# Patient Record
Sex: Male | Born: 1935 | Race: White | Hispanic: No | Marital: Married | Smoking: Never smoker
Health system: Southern US, Community
[De-identification: ages and names within clinical notes are randomized; demographics above are authoritative.]

## PROBLEM LIST (undated history)

## (undated) DIAGNOSIS — T07XXXA Unspecified multiple injuries, initial encounter: Secondary | ICD-10-CM

## (undated) DIAGNOSIS — E039 Hypothyroidism, unspecified: Secondary | ICD-10-CM

## (undated) DIAGNOSIS — K859 Acute pancreatitis without necrosis or infection, unspecified: Secondary | ICD-10-CM

## (undated) DIAGNOSIS — A219 Tularemia, unspecified: Secondary | ICD-10-CM

## (undated) DIAGNOSIS — Z8619 Personal history of other infectious and parasitic diseases: Secondary | ICD-10-CM

## (undated) DIAGNOSIS — I10 Essential (primary) hypertension: Secondary | ICD-10-CM

## (undated) DIAGNOSIS — J189 Pneumonia, unspecified organism: Secondary | ICD-10-CM

## (undated) DIAGNOSIS — K5730 Diverticulosis of large intestine without perforation or abscess without bleeding: Secondary | ICD-10-CM

## (undated) DIAGNOSIS — F0390 Unspecified dementia without behavioral disturbance: Secondary | ICD-10-CM

## (undated) DIAGNOSIS — N400 Enlarged prostate without lower urinary tract symptoms: Secondary | ICD-10-CM

## (undated) DIAGNOSIS — N2889 Other specified disorders of kidney and ureter: Secondary | ICD-10-CM

## (undated) DIAGNOSIS — I639 Cerebral infarction, unspecified: Secondary | ICD-10-CM

## (undated) HISTORY — DX: Personal history of other infectious and parasitic diseases: Z86.19

## (undated) HISTORY — DX: Other specified disorders of kidney and ureter: N28.89

## (undated) HISTORY — DX: Benign prostatic hyperplasia without lower urinary tract symptoms: N40.0

## (undated) HISTORY — DX: Acute pancreatitis without necrosis or infection, unspecified: K85.90

## (undated) HISTORY — DX: Diverticulosis of large intestine without perforation or abscess without bleeding: K57.30

## (undated) HISTORY — DX: Essential (primary) hypertension: I10

## (undated) HISTORY — DX: Hypothyroidism, unspecified: E03.9

## (undated) HISTORY — DX: Tularemia, unspecified: A21.9

## (undated) HISTORY — PX: EYE MUSCLE SURGERY: SHX370

## (undated) HISTORY — DX: Cerebral infarction, unspecified: I63.9

## (undated) HISTORY — DX: Pneumonia, unspecified organism: J18.9

---
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
## 1957-04-04 HISTORY — PX: APPENDECTOMY: SHX54

## 1964-04-04 HISTORY — PX: THYROIDECTOMY, PARTIAL: SHX18

## 1994-01-02 ENCOUNTER — Encounter: Payer: Self-pay | Admitting: Family Medicine

## 1994-01-02 LAB — CONVERTED CEMR LAB: PSA: 0.6 ng/mL

## 1995-06-03 HISTORY — PX: KNEE ARTHROSCOPY: SUR90

## 1997-02-02 ENCOUNTER — Encounter: Payer: Self-pay | Admitting: Family Medicine

## 1997-02-02 LAB — CONVERTED CEMR LAB: PSA: 0.6 ng/mL

## 1998-06-17 ENCOUNTER — Ambulatory Visit (HOSPITAL_COMMUNITY): Admission: RE | Admit: 1998-06-17 | Discharge: 1998-06-17 | Payer: Self-pay | Admitting: Gastroenterology

## 1998-10-16 ENCOUNTER — Ambulatory Visit (HOSPITAL_COMMUNITY): Admission: RE | Admit: 1998-10-16 | Discharge: 1998-10-16 | Payer: Self-pay | Admitting: Gastroenterology

## 1999-02-03 ENCOUNTER — Encounter: Payer: Self-pay | Admitting: Family Medicine

## 2000-02-03 ENCOUNTER — Encounter: Payer: Self-pay | Admitting: Family Medicine

## 2000-03-03 ENCOUNTER — Encounter: Admission: RE | Admit: 2000-03-03 | Discharge: 2000-03-03 | Payer: Self-pay | Admitting: Family Medicine

## 2000-03-03 ENCOUNTER — Encounter: Payer: Self-pay | Admitting: Family Medicine

## 2000-12-06 ENCOUNTER — Encounter (INDEPENDENT_AMBULATORY_CARE_PROVIDER_SITE_OTHER): Payer: Self-pay | Admitting: Specialist

## 2000-12-06 ENCOUNTER — Ambulatory Visit (HOSPITAL_COMMUNITY): Admission: RE | Admit: 2000-12-06 | Discharge: 2000-12-06 | Payer: Self-pay | Admitting: Gastroenterology

## 2001-04-24 ENCOUNTER — Encounter: Payer: Self-pay | Admitting: Orthopedic Surgery

## 2001-04-30 ENCOUNTER — Inpatient Hospital Stay (HOSPITAL_COMMUNITY): Admission: RE | Admit: 2001-04-30 | Discharge: 2001-05-04 | Payer: Self-pay | Admitting: Orthopedic Surgery

## 2001-04-30 HISTORY — PX: JOINT REPLACEMENT: SHX530

## 2001-05-04 ENCOUNTER — Inpatient Hospital Stay (HOSPITAL_COMMUNITY)
Admission: RE | Admit: 2001-05-04 | Discharge: 2001-05-06 | Payer: Self-pay | Admitting: Physical Medicine & Rehabilitation

## 2001-06-12 ENCOUNTER — Encounter: Admission: RE | Admit: 2001-06-12 | Discharge: 2001-07-23 | Payer: Self-pay | Admitting: Orthopedic Surgery

## 2002-02-02 ENCOUNTER — Encounter: Payer: Self-pay | Admitting: Family Medicine

## 2003-01-31 ENCOUNTER — Emergency Department (HOSPITAL_COMMUNITY): Admission: EM | Admit: 2003-01-31 | Discharge: 2003-01-31 | Payer: Self-pay | Admitting: Emergency Medicine

## 2003-04-05 ENCOUNTER — Encounter: Payer: Self-pay | Admitting: Family Medicine

## 2003-04-05 LAB — CONVERTED CEMR LAB: PSA: 0.3 ng/mL

## 2003-11-12 ENCOUNTER — Encounter (INDEPENDENT_AMBULATORY_CARE_PROVIDER_SITE_OTHER): Payer: Self-pay | Admitting: *Deleted

## 2003-11-12 ENCOUNTER — Ambulatory Visit (HOSPITAL_COMMUNITY): Admission: RE | Admit: 2003-11-12 | Discharge: 2003-11-12 | Payer: Self-pay | Admitting: Gastroenterology

## 2004-04-04 ENCOUNTER — Encounter: Payer: Self-pay | Admitting: Family Medicine

## 2004-04-07 ENCOUNTER — Ambulatory Visit: Payer: Self-pay | Admitting: Family Medicine

## 2004-04-09 ENCOUNTER — Ambulatory Visit: Payer: Self-pay | Admitting: Family Medicine

## 2004-10-08 ENCOUNTER — Ambulatory Visit: Payer: Self-pay | Admitting: Family Medicine

## 2004-10-11 ENCOUNTER — Ambulatory Visit: Payer: Self-pay | Admitting: Family Medicine

## 2004-11-01 ENCOUNTER — Ambulatory Visit: Payer: Self-pay | Admitting: Family Medicine

## 2005-02-17 ENCOUNTER — Ambulatory Visit: Payer: Self-pay | Admitting: Family Medicine

## 2005-04-04 ENCOUNTER — Encounter: Payer: Self-pay | Admitting: Family Medicine

## 2005-04-07 ENCOUNTER — Ambulatory Visit: Payer: Self-pay | Admitting: Family Medicine

## 2005-04-13 ENCOUNTER — Ambulatory Visit: Payer: Self-pay | Admitting: Family Medicine

## 2005-04-28 ENCOUNTER — Ambulatory Visit: Payer: Self-pay | Admitting: Family Medicine

## 2005-10-12 ENCOUNTER — Ambulatory Visit: Payer: Self-pay | Admitting: Family Medicine

## 2006-02-15 ENCOUNTER — Ambulatory Visit: Payer: Self-pay | Admitting: Family Medicine

## 2006-04-04 ENCOUNTER — Encounter: Payer: Self-pay | Admitting: Family Medicine

## 2006-04-17 ENCOUNTER — Ambulatory Visit: Payer: Self-pay | Admitting: Family Medicine

## 2006-04-17 LAB — CONVERTED CEMR LAB
AST: 18 units/L (ref 0–37)
Albumin: 3.9 g/dL (ref 3.5–5.2)
CO2: 29 meq/L (ref 19–32)
Chloride: 107 meq/L (ref 96–112)
Chol/HDL Ratio, serum: 2.7
Creatinine, Ser: 1.3 mg/dL (ref 0.4–1.5)
Glucose, Bld: 95 mg/dL (ref 70–99)
Potassium: 4.1 meq/L (ref 3.5–5.1)
Sodium: 141 meq/L (ref 135–145)
Total Bilirubin: 1 mg/dL (ref 0.3–1.2)
Triglyceride fasting, serum: 85 mg/dL (ref 0–149)
VLDL: 17 mg/dL (ref 0–40)

## 2006-04-19 ENCOUNTER — Ambulatory Visit: Payer: Self-pay | Admitting: Family Medicine

## 2006-05-09 ENCOUNTER — Ambulatory Visit: Payer: Self-pay | Admitting: Family Medicine

## 2006-09-28 ENCOUNTER — Encounter: Payer: Self-pay | Admitting: Family Medicine

## 2006-09-28 DIAGNOSIS — R519 Headache, unspecified: Secondary | ICD-10-CM | POA: Insufficient documentation

## 2006-09-28 DIAGNOSIS — Z9189 Other specified personal risk factors, not elsewhere classified: Secondary | ICD-10-CM | POA: Insufficient documentation

## 2006-09-28 DIAGNOSIS — N508 Other specified disorders of male genital organs: Secondary | ICD-10-CM | POA: Insufficient documentation

## 2006-09-28 DIAGNOSIS — R51 Headache: Secondary | ICD-10-CM

## 2006-09-28 DIAGNOSIS — K5730 Diverticulosis of large intestine without perforation or abscess without bleeding: Secondary | ICD-10-CM | POA: Insufficient documentation

## 2006-09-28 DIAGNOSIS — K2270 Barrett's esophagus without dysplasia: Secondary | ICD-10-CM

## 2006-10-02 DIAGNOSIS — N400 Enlarged prostate without lower urinary tract symptoms: Secondary | ICD-10-CM

## 2006-10-05 ENCOUNTER — Ambulatory Visit: Payer: Self-pay | Admitting: Family Medicine

## 2006-10-26 ENCOUNTER — Encounter: Payer: Self-pay | Admitting: Family Medicine

## 2007-02-16 ENCOUNTER — Ambulatory Visit: Payer: Self-pay | Admitting: Family Medicine

## 2007-04-09 ENCOUNTER — Ambulatory Visit: Payer: Self-pay | Admitting: Family Medicine

## 2007-04-09 DIAGNOSIS — E7800 Pure hypercholesterolemia, unspecified: Secondary | ICD-10-CM | POA: Insufficient documentation

## 2007-04-09 LAB — CONVERTED CEMR LAB
ALT: 13 units/L (ref 0–53)
Albumin: 3.8 g/dL (ref 3.5–5.2)
Alkaline Phosphatase: 61 units/L (ref 39–117)
BUN: 22 mg/dL (ref 6–23)
Bilirubin, Direct: 0.1 mg/dL (ref 0.0–0.3)
CO2: 28 meq/L (ref 19–32)
Chloride: 107 meq/L (ref 96–112)
Creatinine,U: 89 mg/dL
GFR calc non Af Amer: 70 mL/min
LDL Cholesterol: 90 mg/dL (ref 0–99)
Microalb Creat Ratio: 4.5 mg/g (ref 0.0–30.0)
Microalb, Ur: 0.4 mg/dL (ref 0.0–1.9)
Potassium: 4.2 meq/L (ref 3.5–5.1)
Sodium: 141 meq/L (ref 135–145)
TSH: 2.88 microintl units/mL (ref 0.35–5.50)
Total CHOL/HDL Ratio: 3
Total Protein: 6.5 g/dL (ref 6.0–8.3)
VLDL: 10 mg/dL (ref 0–40)

## 2007-04-16 ENCOUNTER — Ambulatory Visit: Payer: Self-pay | Admitting: Family Medicine

## 2007-08-22 ENCOUNTER — Ambulatory Visit: Payer: Self-pay | Admitting: Family Medicine

## 2007-09-28 ENCOUNTER — Encounter: Payer: Self-pay | Admitting: Family Medicine

## 2008-01-08 ENCOUNTER — Ambulatory Visit: Payer: Self-pay | Admitting: Family Medicine

## 2008-05-14 ENCOUNTER — Ambulatory Visit: Payer: Self-pay | Admitting: Family Medicine

## 2008-05-21 ENCOUNTER — Ambulatory Visit: Payer: Self-pay | Admitting: Family Medicine

## 2008-08-25 ENCOUNTER — Ambulatory Visit: Payer: Self-pay | Admitting: Family Medicine

## 2008-08-25 LAB — CONVERTED CEMR LAB
ALT: 12 units/L (ref 0–53)
Albumin: 3.7 g/dL (ref 3.5–5.2)
Alkaline Phosphatase: 64 units/L (ref 39–117)
Basophils Absolute: 0 10*3/uL (ref 0.0–0.1)
Bilirubin, Direct: 0.2 mg/dL (ref 0.0–0.3)
CO2: 28 meq/L (ref 19–32)
Creatinine,U: 64.8 mg/dL
GFR calc non Af Amer: 77.27 mL/min (ref >60)
HDL: 40.6 mg/dL (ref >39.00)
LDL Cholesterol: 81 mg/dL (ref 0–99)
Lymphs Abs: 1.4 10*3/uL (ref 0.7–4.0)
MCV: 87.1 fL (ref 78.0–100.0)
Microalb Creat Ratio: 3.1 mg/g (ref 0.0–30.0)
Microalb, Ur: 0.2 mg/dL (ref 0.0–1.9)
Monocytes Absolute: 0.6 10*3/uL (ref 0.1–1.0)
Neutrophils Relative %: 64.1 % (ref 43.0–77.0)
RBC: 4.99 M/uL (ref 4.22–5.81)
TSH: 1.07 microintl units/mL (ref 0.35–5.50)
Total CHOL/HDL Ratio: 3
VLDL: 15.6 mg/dL (ref 0.0–40.0)
WBC: 5.9 10*3/uL (ref 4.5–10.5)

## 2008-08-27 ENCOUNTER — Ambulatory Visit: Payer: Self-pay | Admitting: Family Medicine

## 2009-01-14 ENCOUNTER — Telehealth: Payer: Self-pay | Admitting: Family Medicine

## 2009-01-19 ENCOUNTER — Ambulatory Visit: Payer: Self-pay | Admitting: Family Medicine

## 2009-02-18 ENCOUNTER — Ambulatory Visit: Payer: Self-pay | Admitting: Family Medicine

## 2009-02-18 LAB — CONVERTED CEMR LAB
Glucose, Urine, Semiquant: NEGATIVE
Ketones, urine, test strip: NEGATIVE
Nitrite: NEGATIVE
Protein, U semiquant: >=300

## 2009-02-19 ENCOUNTER — Encounter: Payer: Self-pay | Admitting: Family Medicine

## 2009-03-05 ENCOUNTER — Ambulatory Visit: Payer: Self-pay | Admitting: Family Medicine

## 2009-03-05 DIAGNOSIS — G250 Essential tremor: Secondary | ICD-10-CM

## 2009-03-05 DIAGNOSIS — G252 Other specified forms of tremor: Secondary | ICD-10-CM

## 2009-03-05 LAB — CONVERTED CEMR LAB
Blood in Urine, dipstick: NEGATIVE
Glucose, Urine, Semiquant: NEGATIVE
Ketones, urine, test strip: NEGATIVE
Protein, U semiquant: NEGATIVE
Specific Gravity, Urine: 1.025
Urobilinogen, UA: 0.2
pH: 5

## 2009-04-04 DIAGNOSIS — N2889 Other specified disorders of kidney and ureter: Secondary | ICD-10-CM

## 2009-04-04 DIAGNOSIS — K859 Acute pancreatitis without necrosis or infection, unspecified: Secondary | ICD-10-CM

## 2009-04-04 HISTORY — DX: Other specified disorders of kidney and ureter: N28.89

## 2009-04-04 HISTORY — DX: Acute pancreatitis without necrosis or infection, unspecified: K85.90

## 2009-04-29 ENCOUNTER — Ambulatory Visit: Payer: Self-pay | Admitting: Family Medicine

## 2009-05-07 ENCOUNTER — Encounter: Payer: Self-pay | Admitting: Family Medicine

## 2009-06-11 ENCOUNTER — Encounter: Payer: Self-pay | Admitting: Family Medicine

## 2009-08-26 ENCOUNTER — Ambulatory Visit: Payer: Self-pay | Admitting: Family Medicine

## 2009-08-26 LAB — CONVERTED CEMR LAB
Albumin: 3.9 g/dL (ref 3.5–5.2)
BUN: 24 mg/dL — ABNORMAL HIGH (ref 6–23)
Basophils Absolute: 0.1 10*3/uL (ref 0.0–0.1)
Bilirubin, Direct: 0.2 mg/dL (ref 0.0–0.3)
CO2: 28 meq/L (ref 19–32)
Cholesterol: 139 mg/dL (ref 0–200)
Creatinine, Ser: 1.2 mg/dL (ref 0.4–1.5)
Free T4: 1.1 ng/dL (ref 0.6–1.6)
HCT: 42.9 % (ref 39.0–52.0)
HDL: 40.2 mg/dL (ref >39.00)
Hemoglobin: 14.6 g/dL (ref 13.0–17.0)
Lymphs Abs: 1.6 10*3/uL (ref 0.7–4.0)
MCV: 87.6 fL (ref 78.0–100.0)
Monocytes Relative: 9.2 % (ref 3.0–12.0)
Neutrophils Relative %: 62.4 % (ref 43.0–77.0)
Platelets: 202 10*3/uL (ref 150.0–400.0)
RDW: 13.4 % (ref 11.5–14.6)
Sodium: 142 meq/L (ref 135–145)
TSH: 0.88 microintl units/mL (ref 0.35–5.50)
Total CHOL/HDL Ratio: 3
Triglycerides: 87 mg/dL (ref 0.0–149.0)

## 2009-09-03 ENCOUNTER — Ambulatory Visit: Payer: Self-pay | Admitting: Family Medicine

## 2009-09-03 DIAGNOSIS — R5383 Other fatigue: Secondary | ICD-10-CM

## 2009-09-03 DIAGNOSIS — R5381 Other malaise: Secondary | ICD-10-CM | POA: Insufficient documentation

## 2009-09-06 LAB — CONVERTED CEMR LAB: Vit D, 25-Hydroxy: 40 ng/mL (ref 30–89)

## 2009-10-02 HISTORY — PX: CHOLECYSTECTOMY: SHX55

## 2009-10-14 ENCOUNTER — Inpatient Hospital Stay (HOSPITAL_COMMUNITY)
Admission: EM | Admit: 2009-10-14 | Discharge: 2009-10-18 | Payer: Self-pay | Source: Home / Self Care | Admitting: Emergency Medicine

## 2009-10-17 ENCOUNTER — Encounter (INDEPENDENT_AMBULATORY_CARE_PROVIDER_SITE_OTHER): Payer: Self-pay | Admitting: Internal Medicine

## 2009-10-23 ENCOUNTER — Encounter: Payer: Self-pay | Admitting: Family Medicine

## 2009-11-03 ENCOUNTER — Ambulatory Visit: Payer: Self-pay | Admitting: Family Medicine

## 2009-11-03 DIAGNOSIS — K869 Disease of pancreas, unspecified: Secondary | ICD-10-CM | POA: Insufficient documentation

## 2009-11-10 ENCOUNTER — Encounter (INDEPENDENT_AMBULATORY_CARE_PROVIDER_SITE_OTHER): Payer: Self-pay | Admitting: *Deleted

## 2009-12-02 ENCOUNTER — Telehealth: Payer: Self-pay | Admitting: Family Medicine

## 2010-01-12 ENCOUNTER — Ambulatory Visit (HOSPITAL_COMMUNITY)
Admission: RE | Admit: 2010-01-12 | Discharge: 2010-01-12 | Payer: Self-pay | Source: Home / Self Care | Admitting: Gastroenterology

## 2010-02-03 ENCOUNTER — Ambulatory Visit: Payer: Self-pay | Admitting: Family Medicine

## 2010-03-16 ENCOUNTER — Encounter: Payer: Self-pay | Admitting: Family Medicine

## 2010-03-31 ENCOUNTER — Ambulatory Visit
Admission: RE | Admit: 2010-03-31 | Discharge: 2010-03-31 | Payer: Self-pay | Source: Home / Self Care | Attending: Family Medicine | Admitting: Family Medicine

## 2010-03-31 DIAGNOSIS — F3289 Other specified depressive episodes: Secondary | ICD-10-CM | POA: Insufficient documentation

## 2010-03-31 DIAGNOSIS — N390 Urinary tract infection, site not specified: Secondary | ICD-10-CM | POA: Insufficient documentation

## 2010-03-31 DIAGNOSIS — R32 Unspecified urinary incontinence: Secondary | ICD-10-CM | POA: Insufficient documentation

## 2010-03-31 DIAGNOSIS — F329 Major depressive disorder, single episode, unspecified: Secondary | ICD-10-CM | POA: Insufficient documentation

## 2010-03-31 LAB — CONVERTED CEMR LAB
Bilirubin Urine: NEGATIVE
Ketones, urine, test strip: NEGATIVE
Specific Gravity, Urine: <1.005
pH: 6.5

## 2010-04-01 ENCOUNTER — Encounter: Payer: Self-pay | Admitting: Family Medicine

## 2010-05-06 NOTE — Miscellaneous (Signed)
  Clinical Lists Changes  Observations: Added new observation of PAST MED HX: Diverticulosis, colon Hypertension Hypothyroidism Benign prostatic hypertrophy Gallstone pancreatitis and Ecoli bacteremia 2011 R renal mass 2011; Eval by [name]- echogenic mass on u/s.  Plan repeat CT  ~12/11 per uro.  (10/23/2009 10:23)      Past History:  Past Medical History: Diverticulosis, colon Hypertension Hypothyroidism Benign prostatic hypertrophy Gallstone pancreatitis and Ecoli bacteremia 2011 R renal mass 2011; Eval by [name]- echogenic mass on u/s.  Plan repeat CT  ~12/11 per uro.

## 2010-05-06 NOTE — Assessment & Plan Note (Signed)
Summary: TRANSFER FROM DR SCHALLER/F/U [place]   Vital Signs:  Patient profile:   77 year old male Height:      68.5 inches Weight:      167 pounds BMI:     25.11 Temp:     97.6 degrees F oral Pulse rate:   92 / minute Pulse rhythm:   regular BP sitting:   146 / 80  (left arm) Cuff size:   regular  Vitals Entered By: Delilah Shan CMA Duncan Dull) (November 03, 2009 10:55 AM) CC: Transfer from [facility]  -  Follow up Hospital [facility])   History of Present Illness: Hospitalized with E coli bacteremia and gallstone pancreatitis s/p lap chole.  Now off antibiotics and w/o fever, abdominal pain.  Eating well.  Getting back to baseline relatively quickly, but not quickly enough to suit the patient.  Has been walking 100 yards each way to get to the shop at home to work.  No GI symptoms.  Port sites healing per patient.    H/o small renal mass- see below- has follow up with uro for later this year.   Hospital recs reviewed.   Allergies: 1)  * Sulfa (Sulfonamides) Group  Past History:  Past Medical History: Last updated: 10/23/2009 Diverticulosis, colon Hypertension Hypothyroidism Benign prostatic hypertrophy Gallstone pancreatitis and Ecoli bacteremia 2011 R renal mass 2011; Eval by [name]- echogenic mass on u/s.  Plan repeat CT  ~12/11 per uro.  Past Surgical History: Last updated: 10/19/2009 1/95          Colonoscopy - adenomatous polyps 1940         Rabbit Fever 1959         Appendectomy 1966         Partial Thyroidectomy 1986         Severe Headache W/U  neg (Dr Deno Lunger) 10/95        CXR, increased left costophrenic angle markings 10/94        EKG, bradycardia 12/94        Colonoscopy  Polypectomy (Dr Matthias Hughs) 3/97          L Knee Arthroyomy/Arthroscopy  (Dr Kennith Center) 3/00          Colonoscopy (Buccini)  mild divertics 3/00          EGD SO/eros. esoph. ? Barrett's with increased H.H. 7/00          Resolved esoph. Barrett's with H.H. Schatzki Ring 03/03/00   Scrotal U/S   epid. cyst 12/06/00       EGD Barrett's H.H. 04/30/01     Bilat. tot. knees 11/12/03     EGD with biopsy  - Barrett's mucosa, H.H., esoph. ring  F/U 2 years. 11/12/03     Colonoscopy wnl except divertics  F/U 5 years 11/86        EEG/nml.  07/06/06       EGD/GERD, Barrett's H.H., gastritis  F/U 7/08 10/26/06     EGD Barrett's, gastritis, HH (Dr Buccini) 06/11/09     EGD Barrett"s, Stricture, HH (Dr Buccini) 06/11/09     Colonoscopy Polyp B9 Tubular Adenoma Divertics  (Dr Matthias Hughs) 10/2009      Cholecystectomy   Review of Systems       See HPI.  Otherwise negative.    Physical Exam  General:  GEN: nad, alert and oriented HEENT: mucous membranes moist NECK: supple w/o LA CV: rrr.  no murmur PULM: ctab, no inc wob ABD: soft, +bs, not tender to  palpation, port sites healing well EXT: no edema SKIN: no acute rash    Impression & Recommendations:  Problem # 1:  GALLSTONE PANCREATITIS (ICD-577.9) Stable and recovering well.  Given severity of problem and his age, I think he is actually ahead of schedule.  No change in meds.  Will plan for CPE in  ~08/2010 and patient to follow up with uro this fall.  He agrees.   Complete Medication List: 1)  Aspirin Ec 81 Mg Tbec (Aspirin) .... Take 1 tablet by mouth once a day 2)  Levothyroxine Sodium 100 Mcg Tabs (Levothyroxine sodium) .... Take 1 tablet by mouth once a day 3)  One-daily Multivitamins Tabs (Multiple vitamin) .... Once daily 4)  Gnp Saw Palmetto 160 Mg Caps (Saw palmetto (serenoa repens)) .... Once daily 5)  Pantoprazole Sodium 40 Mg Tbec (Pantoprazole sodium) .Marland Kitchen.. 1 daily by mouth 6)  Terazosin Hcl 5 Mg Caps (Terazosin hcl) .Marland Kitchen.. 1 daily by mouth  Patient Instructions: 1)  Gradually increase your exercise and let me know if you have any other problems.  Keep the appointment with [name] 2)  Please schedule a follow-up appointment as needed .   Current Allergies (reviewed today): * SULFA (SULFONAMIDES) GROUP

## 2010-05-06 NOTE — Assessment & Plan Note (Signed)
Summary: uti/alc   Vital Signs:  Patient profile:   77 year old male Height:      68.5 inches Weight:      164.0 pounds BMI:     24.66 Temp:     97.5 degrees F oral Pulse rate:   86 / minute Pulse rhythm:   regular BP sitting:   140 / 80  (left arm) Cuff size:   regular  Vitals Entered By: Benny Lennert CMA Duncan Dull) (March 31, 2010 10:52 AM)  History of Present Illness: Chief complaint ? uti patient has urgency and incontience since over the weekend. Brill also want to speak to patient about anxiety or depression patient was crying when talking about his urinary problems  77 year old male:  pleasant gentleman who started to have some dysuria and urgency over the weekend., this was during Christmas time. His wife and he both noticed some foul discharge with his urination from his penis. He is also has some incontinence, which is quite upsetting to him. Prior to this, he has not had any incontinence except in around the time of his bladder and gallbladder surgery as well as Escherichia coli sepsis. This was in August of 2011.  Urinating on himself Had this similar problem aftr the gallbladder surgery.  Has been hurting since he starting urinating. A lot of burning sensation.    06/03/2010 - [name], followup  Allergies: 1)  * Sulfa (Sulfonamides) Group  Past History:  Past medical, surgical, family and social histories (including risk factors) reviewed, and no changes noted (except as noted below).  Past Medical History: Reviewed history from 10/23/2009 and no changes required. Diverticulosis, colon Hypertension Hypothyroidism Benign prostatic hypertrophy Gallstone pancreatitis and Ecoli bacteremia 2011 R renal mass 2011; Eval by [name]- echogenic mass on u/s.  Plan repeat CT  ~12/11 per uro.  Past Surgical History: Reviewed history from 10/19/2009 and no changes required. 1/95          Colonoscopy - adenomatous polyps 1940         Rabbit Fever 1959          Appendectomy 1966         Partial Thyroidectomy 1986         Severe Headache W/U  neg (Dr Deno Lunger) 10/95        CXR, increased left costophrenic angle markings 10/94        EKG, bradycardia 12/94        Colonoscopy  Polypectomy (Dr Matthias Hughs) 3/97          L Knee Arthroyomy/Arthroscopy  (Dr Kennith Center) 3/00          Colonoscopy (Buccini)  mild divertics 3/00          EGD SO/eros. esoph. ? Barrett's with increased H.H. 7/00          Resolved esoph. Barrett's with H.H. Schatzki Ring 03/03/00   Scrotal U/S  epid. cyst 12/06/00       EGD Barrett's H.H. 04/30/01     Bilat. tot. knees 11/12/03     EGD with biopsy  - Barrett's mucosa, H.H., esoph. ring  F/U 2 years. 11/12/03     Colonoscopy wnl except divertics  F/U 5 years 11/86        EEG/nml.  07/06/06       EGD/GERD, Barrett's H.H., gastritis  F/U 7/08 10/26/06     EGD Barrett's, gastritis, HH (Dr Matthias Hughs) 06/11/09     EGD Barrett"s, Stricture, HH (Dr Matthias Hughs) 06/11/09  Colonoscopy Polyp B9 Tubular Adenoma Divertics  (Dr Matthias Hughs) 10/2009      Cholecystectomy   Family History: Reviewed history from 09/03/2009 and no changes required. Father: Dec. 66 ETOH Mother: Dec. 57 HTN, stroke Brothers A 11 Brain probs...has drain  leg problems, prostate CA, PVD - stent Brother A 70  Heart Probs Brother A 4 Brother A 60 Sister A 64 foot surgery Back probs CV:  CAD brother HBP:  Mother Prostate CA:  (+) brother ETOH: (+) father, ETOH Stroke:  (+) mother  Social History: Reviewed history from 09/28/2006 and no changes required. 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 GI:  Complains of nausea; denies vomiting. GU:  See HPI; Complains of dysuria, hematuria, incontinence, and urinary frequency.  Physical  Exam  General:  Well-developed,well-nourished,in no acute distress; alert,appropriate and cooperative throughout examination Head:  Normocephalic and atraumatic without obvious abnormalities. No apparent alopecia or balding. Ears:  no external deformities.   Nose:  no external deformity.   Neck:  No deformities, masses, or tenderness noted. Chest Wall:  No deformities, masses, tenderness or gynecomastia noted. Lungs:  Normal respiratory effort, chest expands symmetrically. Lungs are clear to auscultation, no crackles or wheezes. Heart:  Normal rate and regular rhythm. S1 and S2 normal without gallop, murmur, click, rub or other extra sounds. Abdomen:  Bowel sounds positive,abdomen soft and non-tender without masses, organomegaly or hernias noted. well healed surgical scars. Extremities:  No clubbing, cyanosis, edema, or deformity noted with normal full range of motion of all joints.   Cervical Nodes:  No lymphadenopathy noted Psych:  Cognition and judgment appear intact. Alert and cooperative with normal attention span and concentration. No apparent delusions, illusions, hallucinations   Impression & Recommendations:  Problem # 1:  UTI (ICD-599.0) Assessment New patient quite upset. Clinically and UA is consistent with urinary tract infection. Will treat as such.  If he is not improving for 5 days, think follow up with urology given his gross incontinence is appropriate and he has an established urologist.  His updated medication list for this problem includes:    Ciprofloxacin Hcl 250 Mg Tabs (Ciprofloxacin hcl) .Marland Kitchen... 1 by mouth two times a day  Orders: T-Culture, Urine ([id number])  Problem # 2:  URINARY INCONTINENCE, MALE (ICD-788.30) Assessment: New  Problem # 3:  DEPRESSION (ICD-311) situational - discussed with he and his wife at length. prior to him feeling bad, felt fairly well.  Complete Medication List: 1)  Aspirin Ec 81 Mg Tbec (Aspirin) .... Take 1 tablet by mouth  once a day 2)  Levothyroxine Sodium 100 Mcg Tabs (Levothyroxine sodium) .... Take 1 tablet by mouth once a day 3)  One-daily Multivitamins Tabs (Multiple vitamin) .... Once daily 4)  Gnp Saw Palmetto 160 Mg Caps (Saw palmetto (serenoa repens)) .... Once daily 5)  Pantoprazole Sodium 40 Mg Tbec (Pantoprazole sodium) .Marland Kitchen.. 1 daily by mouth 6)  Terazosin Hcl 5 Mg Caps (Terazosin hcl) .Marland Kitchen.. 1 daily by mouth 7)  Ciprofloxacin Hcl 250 Mg Tabs (Ciprofloxacin hcl) .Marland Kitchen.. 1 by mouth two times a day  Other Orders: UA Dipstick w/o Micro (manual) ([id number])  Patient Instructions: 1)  IF YOU ARE WORSENING CALL 2)  WE WILL CULTURE YOUR URINE, IF THAT IS NOT INFECTED OR YOU ARE NOT GETTING BETTER BY MONDAY,  THEN I WANT YOU TO SEE DR. TANNENBAUM OR ONE OF HIS PARTNERS. Prescriptions: CIPROFLOXACIN HCL 250 MG TABS (CIPROFLOXACIN HCL) 1 by mouth two times a day  #14 x 0   Entered and Authorized by:   Hannah Beat MD   Signed by:   Hannah Beat MD on 03/31/2010   Method used:   Electronically to        [facility]* (retail)       [address]       [place], [place]  [address]       Ph: [phone]       Fax: [phone]   RxID:   [id number]    Orders Added: 1)  T-Culture, Urine [[id number]] 2)  Est. Patient Level IV [27253] 3)  UA Dipstick w/o Micro (manual) [81002]    Current Allergies (reviewed today): * SULFA (SULFONAMIDES) GROUP  Laboratory Results   Urine Tests  Date/Time Received: March 31, 2010 11:02 AM  Date/Time Reported: March 31, 2010 11:02 AM   Routine Urinalysis   Color: yellow Appearance: Hazy Glucose: negative   (Normal Range: Negative) Bilirubin: negative   (Normal Range: Negative) Ketone: negative   (Normal Range: Negative) Spec. Gravity: <1.005   (Normal Range: 1.003-1.035) Blood: large   (Normal Range: Negative) pH: 6.5   (Normal Range: 5.0-8.0) Protein: 30   (Normal Range: Negative) Urobilinogen: 0.2   (Normal Range: 0-1) Nitrite: negative    (Normal Range: Negative) Leukocyte Esterace: large   (Normal Range: Negative)       Appended Document: uti/alc

## 2010-05-06 NOTE — Assessment & Plan Note (Signed)
Summary: SCHALLER FLU SHOT/[facility]  Nurse Visit   Allergies: 1)  * Sulfa (Sulfonamides) Group  Immunizations Administered:  Influenza Vaccine # 1:    Vaccine Type: Fluvax MCR    Site: left deltoid    Mfr: GlaxoSmithKline    Dose: 0.5 ml    Route: IM    Given by: Benny Lennert CMA (AAMA)    Exp. Date: 10/02/2010    Lot #: [id number]    VIS given: 10/27/09 version given February 03, 2010.  Flu Vaccine Consent Questions:    Do you have a history of severe allergic reactions to this vaccine? no    Any prior history of allergic reactions to egg and/or gelatin? no    Do you have a sensitivity to the preservative Thimersol? no    Do you have a past history of Guillan-Barre Syndrome? no    Do you currently have an acute febrile illness? no    Have you ever had a severe reaction to latex? no    Vaccine information given and explained to patient? yes  Orders Added: 1)  Influenza Vaccine MCR [00025] 2)  Flu Vaccine 70yrs + MEDICARE PATIENTS [Q2039]

## 2010-05-06 NOTE — Progress Notes (Signed)
Summary: [facility]ill [facility] from [facility]  Phone Note Refill Request Message from:  Fax from Pharmacy  Refills Requested: Medication #1:  LEVOTHYROXINE SODIUM 100 MCG TABS Take 1 tablet by mouth once a day  Medication #2:  TERAZOSIN HCL 5 MG CAPS 1 daily by mouth. Faxed requests from [facility] are on your desk.  These were faxed electronically on 8/29.  Initial call taken by: Lowella Petties CMA,  December 02, 2009 12:29 PM  Follow-up for Phone Call        signed, thanks.  Follow-up by: [name] MD,  December 02, 2009 1:59 PM  Additional Follow-up for Phone Call Additional follow up Details #1::        Forms faxed to [facility]. Additional Follow-up by: Lowella Petties CMA,  December 03, 2009 5:09 PM

## 2010-05-06 NOTE — Procedures (Signed)
Summary: Upper GI by Dr.Marvina Danner Buccini,[facility]  Upper GI by Dr.Kelden Lavallee Buccini,[facility]   Imported By: Beau Fanny 06/19/2009 10:22:16  _____________________________________________________________________  External Attachment:    Type:   Image     Comment:   External Document

## 2010-05-06 NOTE — Assessment & Plan Note (Signed)
Summary: 6 week follow up/[facility]   Vital Signs:  Patient profile:   77 year old male Weight:      173 pounds Temp:     97.6 degrees F oral Pulse rate:   68 / minute Pulse rhythm:   irregular BP sitting:   122 / 80  (left arm) Cuff size:   regular  Vitals Entered By: Sydell Axon LPN (April 29, 2009 12:20 PM) CC: 6 week follow-up, ran out of Lopressor last week and did not get it refilled because he did not feel that it made a difference   History of Present Illness: Pt here with wife for followup from last time. He had been treated fior UTI, quite possibly early Pyelo the time before and is still mildy washed out from disease state. He is normal from a urination  standpoint but still not back to nml energy wise. He still has not seen blood again. His tremor was not improved at all with the metoprolol. He deines difficutly getting out of chair, initiating walk, controlling speed of walking, expression, etc.  Problems Prior to Update: 1)  Tremor, Essential, Left Hand  (ICD-333.1) 2)  Microscopic Hematuria w/ Infection  (ICD-599.72) 3)  Shoulder Pain, Bilateral  (ICD-719.41) 4)  Pure Hypercholesterolemia  (ICD-272.0) 5)  Unspec Disorder Carbohydrate Transport&metab  (ICD-271.9) 6)  Headache  (ICD-784.0) 7)  Epididymal Cyst  (ICD-608.89) 8)  Barrett's Esophagus  (ICD-530.85) 9)  Polypectomy, Hx of  (ICD-V15.9) 10)  Benign Prostatic Hypertrophy  (ICD-600.00) 11)  Hypothyroidism  (ICD-244.9) 12)  Hypertension  (ICD-401.9) 13)  Diverticulosis, Colon  (ICD-562.10)  Medications Prior to Update: 1)  Aspirin Ec 81 Mg Tbec (Aspirin) .... Take 1 Tablet By Mouth Once A Day 2)  Hytrin 5 Mg Caps (Terazosin Hcl) .... Take 1 Capsule Once A Day 3)  Levothyroxine Sodium 100 Mcg Tabs (Levothyroxine Sodium) .... Take 1 Tablet By Mouth Once A Day 4)  One-Daily Multivitamins   Tabs (Multiple Vitamin) .... Once Daily 5)  Gnp Saw Palmetto 160 Mg  Caps (Saw Palmetto (Serenoa Repens)) .... Once Daily 6)   Pantoprazole Sodium 40 Mg  Tbec (Pantoprazole Sodium) .Marland Kitchen.. 1 Daily By Mouth 7)  Terazosin Hcl 5 Mg Caps (Terazosin Hcl) .Marland Kitchen.. 1 Daily By Mouth 8)  Cipro 500 Mg Tabs (Ciprofloxacin Hcl) .... One Tab By Mouth Two Times A Day 9)  Lopressor 50 Mg Tabs (Metoprolol Tartrate) .... One Tab Once A Day For One Week and Then Twice A Day.  Allergies: 1)  * Sulfa (Sulfonamides) Group  Physical Exam  General:  Well-developed,well-nourished,in mild distress from urinary urgency,  alert,appropriate and cooperative throughout examination Head:  Normocephalic and atraumatic without obvious abnormalities. No apparent alopecia or balding. Eyes:  Conjunctiva clear bilaterally.  Ears:  External ear exam shows no significant lesions or deformities.  Otoscopic examination reveals clear canals, tympanic membranes are intact bilaterally without bulging, retraction, inflammation or discharge. Hearing is grossly normal bilaterally. Back to nml. Nose:  External nasal examination shows no deformity or inflammation. Nasal mucosa are pink and moist without lesions or exudates. Mouth:  Oral mucosa and oropharynx without lesions or exudates.  Teeth in good repair. Neck:  No deformities, masses, or tenderness noted. Lungs:  Normal respiratory effort, chest expands symmetrically. Lungs are clear to auscultation, no crackles or wheezes. Heart:  Normal rate and regular rhythm. S1 and S2 normal without gallop, murmur, click, rub or other extra sounds. Neurologic:  No cranial nerve deficits noted. Station and gait are normal. DTRs  are symmetrical throughout. Sensory, motor and coordinative functions appear intact. Skin:  Intact without suspicious lesions or rashes Psych:  Cognition and judgment appear intact. Alert and cooperative with normal attention span and concentration. No apparent delusions, illusions, hallucinations   Impression & Recommendations:  Problem # 1:  MICROSCOPIC HEMATURIA W/ INFECTION (ICD-599.72) Assessment  Improved  Essentially nml with fatigue still improving The following medications were removed from the medication list:    Cipro 500 Mg Tabs (Ciprofloxacin hcl) ..... One tab by mouth two times a day  Problem # 2:  TREMOR, ESSENTIAL, LEFT HAND (ICD-333.1) Assessment: Unchanged No impr with Metoprolol. Will refer to Neurology for presumed start of parkinson meds for ess tremor. Donot think pt has Parkinson's/ Orders: Neurology Referral (Neuro)  Problem # 3:  HYPERTENSION (ICD-401.9) Assessment: Unchanged Stable without Metoprolol. Cont off med. The following medications were removed from the medication list:    Hytrin 5 Mg Caps (Terazosin hcl) .Marland Kitchen... Take 1 capsule once a day His updated medication list for this problem includes:    Terazosin Hcl 5 Mg Caps (Terazosin hcl) .Marland Kitchen... 1 daily by mouth    Lopressor 50 Mg Tabs (Metoprolol tartrate) .Marland Kitchen... Take one by mouth twice a day.  BP today: 122/80 Prior BP: 120/80 (03/05/2009)  Prior 10 Yr Risk Heart Disease: 11 % (03/05/2009)  Labs Reviewed: K+: 4.4 (08/25/2008) Creat: : 1.0 (08/25/2008)   Chol: 137 (08/25/2008)   HDL: 40.60 (08/25/2008)   LDL: 81 (08/25/2008)   TG: 78.0 (08/25/2008)  Complete Medication List: 1)  Aspirin Ec 81 Mg Tbec (Aspirin) .... Take 1 tablet by mouth once a day 2)  Levothyroxine Sodium 100 Mcg Tabs (Levothyroxine sodium) .... Take 1 tablet by mouth once a day 3)  One-daily Multivitamins Tabs (Multiple vitamin) .... Once daily 4)  Gnp Saw Palmetto 160 Mg Caps (Saw palmetto (serenoa repens)) .... Once daily 5)  Pantoprazole Sodium 40 Mg Tbec (Pantoprazole sodium) .Marland Kitchen.. 1 daily by mouth 6)  Terazosin Hcl 5 Mg Caps (Terazosin hcl) .Marland Kitchen.. 1 daily by mouth 7)  Lopressor 50 Mg Tabs (Metoprolol tartrate) .... Take one by mouth twice a day.  Patient Instructions: 1)  Refer to Neurology. 2)  RTC 5/11 for Comp Exam, labs prior  Current Allergies (reviewed today): * SULFA (SULFONAMIDES) GROUP

## 2010-05-06 NOTE — Assessment & Plan Note (Signed)
Summary: CPX/DLO   Vital Signs:  Patient profile:   77 year old male Height:      68.5 inches Weight:      170 pounds BMI:     25.56 Temp:     97.6 degrees F oral Pulse rate:   64 / minute Pulse rhythm:   irregular BP sitting:   118 / 80  (left arm) Cuff size:   regular  Vitals Entered By: Sydell Axon LPN (September 03, 8117 9:27 AM) CC: 30 Minute checkup, had a colonoscopy and endoscopy 03/11 by Dr. Matthias Hughs   History of Present Illness: Pt here with his wife for recheck. One month or 6 weeks ago, he "couldn't see." Vision was real blurry but got better in a litlle while. It has not happened again. The signs on the road were unreadable until getting right on them. He hadn't had brfst before, can't remember whether he was lightheaded. He eats once a day and sleeps 12 hrs a day.  He has no energy and is fatigued. He essentially has no activity and does no exercising.   Preventive Screening-Counseling & Management  Alcohol-Tobacco     Alcohol drinks/day: 0     Alcohol type: wine less than once a week.     Smoking Status: never     Passive Smoke Exposure: no  Caffeine-Diet-Exercise     Caffeine use/day: 4     Does Patient Exercise: yes     Type of exercise: stays active.     Times/week: 5  Problems Prior to Update: 1)  Tremor, Essential, Left Hand  (ICD-333.1) 2)  Microscopic Hematuria w/ Infection  (ICD-599.72) 3)  Shoulder Pain, Bilateral  (ICD-719.41) 4)  Pure Hypercholesterolemia  (ICD-272.0) 5)  Unspec Disorder Carbohydrate Transport&metab  (ICD-271.9) 6)  Headache  (ICD-784.0) 7)  Epididymal Cyst  (ICD-608.89) 8)  Barrett's Esophagus  (ICD-530.85) 9)  Polypectomy, Hx of  (ICD-V15.9) 10)  Benign Prostatic Hypertrophy  (ICD-600.00) 11)  Hypothyroidism  (ICD-244.9) 12)  Hypertension  (ICD-401.9) 13)  Diverticulosis, Colon  (ICD-562.10)  Medications Prior to Update: 1)  Aspirin Ec 81 Mg Tbec (Aspirin) .... Take 1 Tablet By Mouth Once A Day 2)  Levothyroxine Sodium 100  Mcg Tabs (Levothyroxine Sodium) .... Take 1 Tablet By Mouth Once A Day 3)  One-Daily Multivitamins   Tabs (Multiple Vitamin) .... Once Daily 4)  Gnp Saw Palmetto 160 Mg  Caps (Saw Palmetto (Serenoa Repens)) .... Once Daily 5)  Pantoprazole Sodium 40 Mg  Tbec (Pantoprazole Sodium) .Marland Kitchen.. 1 Daily By Mouth 6)  Terazosin Hcl 5 Mg Caps (Terazosin Hcl) .Marland Kitchen.. 1 Daily By Mouth 7)  Lopressor 50 Mg Tabs (Metoprolol Tartrate) .... Take One By Mouth Twice A Day.  Allergies: 1)  * Sulfa (Sulfonamides) Group  Past History:  Past Medical History: Last updated: 09/28/2006 Diverticulosis, colon Hypertension Hypothyroidism Benign prostatic hypertrophy  Past Surgical History: Last updated: 06/21/2009 1/95          Colonoscopy - adenomatous polyps 1940         Rabbit Fever 1959         Appendectomy 1966         Partial Thyroidectomy 1986         Severe Headache W/U  neg (Dr Deno Lunger) 10/95        CXR, increased left costophrenic angle markings 10/94        EKG, bradycardia 12/94        Colonoscopy  Polypectomy (Dr Matthias Hughs) 3/97  L Knee Arthroyomy/Arthroscopy  (Dr Kennith Center) 3/00          Colonoscopy (Buccini)  mild divertics 3/00          EGD SO/eros. esoph. ? Barrett's with increased H.H. 7/00          Resolved esoph. Barrett's with H.H. Schatzki Ring 03/03/00   Scrotal U/S  epid. cyst 12/06/00       EGD Barrett's H.H. 04/30/01     Bilat. tot. knees 11/12/03     EGD with biopsy  - Barrett's mucosa, H.H., esoph. ring  F/U 2 years. 11/12/03     Colonoscopy wnl except divertics  F/U 5 years 11/86        EEG/nml.  07/06/06       EGD/GERD, Barrett's H.H., gastritis  F/U 7/08 10/26/06     EGD Barrett's, gastritis, HH (Dr Buccini) 06/11/09     EGD Barrett"s, Stricture, HH (Dr Buccini) 06/11/09     Colonoscopy Polyp B9 Tubular Adenoma Divertics  (Dr Matthias Hughs)  Family History: Last updated: 09/03/2009 Father: Dec. 66 ETOH Mother: Dec. 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 66 ETOH Mother: Dec. 57 HTN, stroke Brothers A 72 Brain probs...has drain  leg problems, prostate CA, PVD - stent Brother A 70  Heart Probs Brother A 62 Brother A 60 Sister A 64 foot surgery Back probs CV:  CAD brother HBP:  Mother Prostate CA:  (+) brother ETOH: (+) father, ETOH Stroke:  (+) mother  Review of Systems General:  Complains of fatigue; denies chills and fever; swee HPI. Eyes:  Complains of blurring; denies discharge and itching; one episode per HPI. ENT:  Complains of decreased hearing; denies ear discharge, earache, and ringing in ears. CV:  Complains of fatigue and shortness of breath with exertion; denies chest pain or discomfort, fainting, palpitations, swelling of feet, and swelling of hands. Resp:  Complains of cough; denies shortness of breath and wheezing; gets up plegm a lot.Marland Kitchen GI:  Denies abdominal pain, bloody stools, change in bowel habits, constipation, dark tarry stools, diarrhea, indigestion, loss of appetite, nausea, vomiting, vomiting blood, and yellowish skin color. GU:  Denies discharge, dysuria, nocturia, urinary frequency, and urinary hesitancy. MS:  Complains of joint pain and stiffness; denies low back pain, muscle aches, and cramps; shoulders. Derm:  Denies  dryness, itching, and rash. Neuro:  Denies numbness, poor balance, tingling, and tremors.  Physical Exam  General:  Well-developed,well-nourished,in mild distress from urinary urgency,  alert,appropriate and cooperative throughout examination Head:  Normocephalic and atraumatic without obvious abnormalities. No apparent alopecia or balding. Eyes:  Conjunctiva clear bilaterally.  Ears:  External ear exam shows no significant lesions or deformities.  Otoscopic examination reveals clear canals, tympanic membranes are intact bilaterally without bulging, retraction, inflammation or discharge. Hearing is grossly normal bilaterally. Back to nml.  Nose:  External nasal examination shows no deformity or inflammation. Nasal mucosa are pink and moist without lesions or exudates. Mouth:  Oral mucosa and oropharynx without lesions or exudates.  Teeth in good repair. Neck:  No deformities, masses, or tenderness noted. Chest Wall:  No deformities, masses, tenderness or gynecomastia noted. Breasts:  No masses or gynecomastia noted Lungs:  Normal respiratory effort, chest expands symmetrically. Lungs are clear to auscultation, no crackles or wheezes. Heart:  Normal rate and regular rhythm. S1 and S2 normal without gallop, murmur, click, rub or other extra sounds. Abdomen:  Bowel sounds positive,abdomen soft and non-tender without masses, organomegaly  noted. No suprapubic pain. Rectal:  Not done, just had colonoscopy. Genitalia:  Testes bilaterally descended without nodularity, tenderness or masses. No scrotal masses or lesions. No penis lesions or urethral discharge. Prostate:  Not done. Msk:  No deformity or scoliosis noted of thoracic or lumbar spine.  No CVAT. Pulses:  R and L carotid,radial,femoral,dorsalis pedis and posterior tibial pulses are full and equal bilaterally Extremities:  No clubbing, cyanosis, edema, or deformity noted with reasonable range of motion of all joints  except right  shoulder. Neurologic:  No cranial nerve deficits noted. Station and gait are normal. DTRs are symmetrical throughout. Sensory, motor and coordinative functions appear intact. Skin:  Intact without suspicious lesions or rashes, sun damage globally with multiple AKS and some SKs. Cervical Nodes:  No lymphadenopathy noted Inguinal Nodes:  No significant adenopathy Psych:  Cognition and judgment appear intact. Alert and cooperative with normal attention span and concentration. No apparent delusions, illusions, hallucinations   Impression & Recommendations:  Problem # 1:  UNSPEC DISORDER CARBOHYDRATE TRANSPORT&METAB (ICD-271.9) He eats once a day at present. He needs to eat 6 small snacks a day. Tried to encourage regular intake to preclude highs and lows. He really needs regular activity and exercise.  Problem # 2:  FATIGUE (ICD-780.79) Assessment: New Will check Vit D but is unrealistic to do nothing, eat poorly and expect to have lots of energy. Discussed at length. Labs today look ok and show no reason for fatigue. Orders: T-Vitamin D (25-Hydroxy) [id number])  Problem # 3:  TREMOR, ESSENTIAL, LEFT HAND (ICD-333.1) Assessment: Unchanged Stable, gets worse when gets tired.  Problem # 4:  PURE HYPERCHOLESTEROLEMIA (ICD-272.0) Assessment: Improved Well controlled on no medications. He is blessed. Cont. Labs Reviewed: SGOT: 17 (08/26/2009)   SGPT: 14 (08/26/2009)  Prior 10 Yr Risk Heart Disease: 11 % (03/05/2009)   HDL:40.20 (08/26/2009), 40.60 (08/25/2008)  LDL:81 (08/26/2009), 81 (08/25/2008)  Chol:139 (08/26/2009), 137 (08/25/2008)  Trig:87.0 (08/26/2009), 78.0 (08/25/2008)  Problem # 5:  BENIGN PROSTATIC HYPERTROPHY (ICD-600.00) Assessment: Unchanged Seems stable. He has minimal sxs.  Problem # 6:  HYPOTHYROIDISM (ICD-244.9) Assessment: Unchanged Well controlled. Cont curr dose of meds. His updated medication list for this problem includes:    Levothyroxine Sodium 100 Mcg  Tabs (Levothyroxine sodium) .Marland Kitchen... Take 1 tablet by mouth once a day  Labs Reviewed: TSH: 0.88 (08/26/2009)    Chol: 139 (08/26/2009)   HDL: 40.20 (08/26/2009)   LDL: 81 (08/26/2009)   TG: 87.0 (08/26/2009)  Problem # 7:  HYPERTENSION (ICD-401.9) Assessment: Unchanged Well controlled. Cont curr meds at curr doses. The following medications were removed from the medication list:    Lopressor 50 Mg Tabs (Metoprolol tartrate) .Marland Kitchen... Take one by mouth twice a day. His updated medication list for this problem includes:    Terazosin Hcl 5 Mg Caps (Terazosin hcl) .Marland Kitchen... 1 daily by mouth  BP today:  118/80 Prior BP: 122/80 (04/29/2009)  Prior 10 Yr Risk Heart Disease: 11 % (03/05/2009)  Labs Reviewed: K+: 4.4 (08/26/2009) Creat: : 1.2 (08/26/2009)   Chol: 139 (08/26/2009)   HDL: 40.20 (08/26/2009)   LDL: 81 (08/26/2009)   TG: 87.0 (08/26/2009)  Problem # 8:  DIVERTICULOSIS, COLON (ICD-562.10) Assessment: Unchanged Discussed being seen for prolonged LLQ discomfort. Labs Reviewed: Hgb: 14.6 (08/26/2009)   Hct: 42.9 (08/26/2009)   WBC: 6.7 (08/26/2009)  Complete Medication List: 1)  Aspirin Ec 81 Mg Tbec (Aspirin) .... Take 1 tablet by mouth once a day 2)  Levothyroxine Sodium 100 Mcg Tabs (Levothyroxine sodium) .... Take 1 tablet by mouth once a day 3)  One-daily Multivitamins Tabs (Multiple vitamin) .... Once daily 4)  Gnp Saw Palmetto 160 Mg Caps (Saw palmetto (serenoa repens)) .... Once daily 5)  Pantoprazole Sodium 40 Mg Tbec (Pantoprazole sodium) .Marland Kitchen.. 1 daily by mouth 6)  Terazosin Hcl 5 Mg Caps (Terazosin hcl) .Marland Kitchen.. 1 daily by mouth  Patient Instructions: 1)  RTC 6 mos, sooner if fatigue does not improve. 2)  Will report back to him with Vit D lvl.  Current Allergies (reviewed today): * SULFA (SULFONAMIDES) GROUP

## 2010-05-06 NOTE — Consult Note (Signed)
Summary: Dr.Yijun Yan,[facility]Note  Dr.Yijun Yan,[facility]Note   Imported By: Beau Fanny 05/07/2009 10:56:24  _____________________________________________________________________  External Attachment:    Type:   Image     Comment:   External Document

## 2010-05-06 NOTE — Procedures (Signed)
Summary: Colonoscopy by Dr.Miasia Crabtree Buccini,[facility]  Colonoscopy by Dr.Oakes Mccready Buccini,[facility]   Imported By: Beau Fanny 06/19/2009 10:21:07  _____________________________________________________________________  External Attachment:    Type:   Image     Comment:   External Document  Appended Document: Colonoscopy by Dr.Inger Wiest Buccini,[facility]    Clinical Lists Changes  Observations: Added new observation of PAST SURG HX: 1/95          Colonoscopy - adenomatous polyps 1940         Rabbit Fever 1959         Appendectomy 1966         Partial Thyroidectomy 1986         Severe Headache W/U  neg (Dr Deno Lunger) 10/95        CXR, increased left costophrenic angle markings 10/94        EKG, bradycardia 12/94        Colonoscopy  Polypectomy (Dr Matthias Hughs) 3/97          L Knee Arthroyomy/Arthroscopy  (Dr Kennith Center) 3/00          Colonoscopy (Buccini)  mild divertics 3/00          EGD SO/eros. esoph. ? Barrett's with increased H.H. 7/00          Resolved esoph. Barrett's with H.H. Schatzki Ring 03/03/00   Scrotal U/S  epid. cyst 12/06/00       EGD Barrett's H.H. 04/30/01     Bilat. tot. knees 11/12/03     EGD with biopsy  - Barrett's mucosa, H.H., esoph. ring  F/U 2 years. 11/12/03     Colonoscopy wnl except divertics  F/U 5 years 11/86        EEG/nml.  07/06/06       EGD/GERD, Barrett's H.H., gastritis  F/U 7/08 10/26/06     EGD Barrett's, gastritis, HH (Dr Buccini) 06/11/09     EGD Barrett"s, Stricture, HH (Dr Buccini) 06/11/09     Colonoscopy Polyp B9 Tubular Adenoma Divertics  (Dr Matthias Hughs)  (06/21/2009 9:33)       Past Surgical History:    1/95          Colonoscopy - adenomatous polyps    1940         Rabbit Fever    1959         Appendectomy    1966         Partial Thyroidectomy    1986         Severe Headache W/U  neg (Dr Deno Lunger)    10/95        CXR, increased left costophrenic angle markings    10/94        EKG, bradycardia    12/94        Colonoscopy  Polypectomy (Dr  Matthias Hughs)    3/97          L Knee Arthroyomy/Arthroscopy  (Dr Kennith Center)    3/00          Colonoscopy (Buccini)  mild divertics    3/00          EGD SO/eros. esoph. ? Barrett's with increased H.H.    7/00          Resolved esoph. Barrett's with H.H. Schatzki Ring    03/03/00   Scrotal U/S  epid. cyst    12/06/00       EGD Barrett's H.H.    04/30/01     Bilat. tot. knees    11/12/03  EGD with biopsy  - Barrett's mucosa, H.H., esoph. ring  F/U 2 years.    11/12/03     Colonoscopy wnl except divertics  F/U 5 years    11/86        EEG/nml.     07/06/06       EGD/GERD, Barrett's H.H., gastritis  F/U 7/08    10/26/06     EGD Barrett's, gastritis, HH (Dr Buccini)    06/11/09     EGD Barrett"s, Stricture, HH (Dr Buccini)    06/11/09     Colonoscopy Polyp B9 Tubular Adenoma Divertics  (Dr Matthias Hughs)

## 2010-05-06 NOTE — Letter (Signed)
Summary: [facility]  [facility]   Imported By: Maryln Gottron 04/07/2010 09:36:59  _____________________________________________________________________  External Attachment:    Type:   Image     Comment:   External Document  Appended Document: [facility]    Clinical Lists Changes  Observations: Added new observation of PAST MED HX: Diverticulosis, colon Hypertension Hypothyroidism Benign prostatic hypertrophy Gallstone pancreatitis and Ecoli bacteremia 2011 R renal mass 2011; Eval by [name]- echogenic mass on u/s.  Possible RCC per Uro  (04/07/2010 21:51)       Past History:  Past Medical History: Diverticulosis, colon Hypertension Hypothyroidism Benign prostatic hypertrophy Gallstone pancreatitis and Ecoli bacteremia 2011 R renal mass 2011; Eval by [name]- echogenic mass on u/s.  Possible RCC per Uro

## 2010-05-06 NOTE — Letter (Signed)
Summary: Nadara Eaton letter  [place] [facility]  [address] [place], [place] [address]   Phone: [phone]  Fax: [phone]       11/10/2009 MRN: [id number]  [address] [place], [place]  [address]  Dear [name], [name] [place], and [facility] announce the retirement of Arta Silence, M.D., from full-time practice at the [facility] office effective October 01, 2009 and his plans of returning part-time.  It is important to Dr. Hetty Ely and to our practice that you understand that [facility] has seven physicians in our office for your health care needs.  We will continue to offer the same exceptional care that you have today.    Dr. Hetty Ely has spoken to many of you about his plans for retirement and returning part-time in the fall.   We will continue to work with you through the transition to schedule appointments for you in the office and meet the high standards that [facility] is committed to.   Again, it is with great pleasure that we share the news that Dr. Hetty Ely will return to [facility] in October of 2011 with a reduced schedule.    If you have any questions, or would like to request an appointment with one of our physicians, please call us at [phone] and press the option for Scheduling an appointment.  We take pleasure in providing you with excellent patient care and look forward to seeing you at your next office visit.  Our [facility] Physicians are:  Tillman Abide, M.D. Laurita Quint, M.D. Roxy Manns, M.D. Kerby Nora, M.D. Hannah Beat, M.D. Ruthe Mannan, M.D. We proudly welcomed Raechel Ache, M.D. and Eustaquio Boyden, M.D. to the practice in July/August 2011.  Sincerely,  [facility]

## 2010-06-09 ENCOUNTER — Encounter: Payer: Self-pay | Admitting: Family Medicine

## 2010-06-19 LAB — COMPREHENSIVE METABOLIC PANEL
ALT: 235 U/L — ABNORMAL HIGH (ref 0–53)
AST: 174 U/L — ABNORMAL HIGH (ref 0–37)
AST: 43 U/L — ABNORMAL HIGH (ref 0–37)
AST: 71 U/L — ABNORMAL HIGH (ref 0–37)
Alkaline Phosphatase: 73 U/L (ref 39–117)
Alkaline Phosphatase: 81 U/L (ref 39–117)
CO2: 26 mEq/L (ref 19–32)
Calcium: 8 mg/dL — ABNORMAL LOW (ref 8.4–10.5)
Calcium: 8.2 mg/dL — ABNORMAL LOW (ref 8.4–10.5)
Calcium: 8.3 mg/dL — ABNORMAL LOW (ref 8.4–10.5)
Chloride: 106 mEq/L (ref 96–112)
Chloride: 109 mEq/L (ref 96–112)
Creatinine, Ser: 1.23 mg/dL (ref 0.4–1.5)
GFR calc Af Amer: >60 mL/min (ref >60)
GFR calc Af Amer: >60 mL/min (ref >60)
GFR calc non Af Amer: 57 mL/min — ABNORMAL LOW (ref >60)
GFR calc non Af Amer: >60 mL/min (ref >60)
GFR calc non Af Amer: >60 mL/min (ref >60)
Glucose, Bld: 104 mg/dL — ABNORMAL HIGH (ref 70–99)
Glucose, Bld: 112 mg/dL — ABNORMAL HIGH (ref 70–99)
Sodium: 137 mEq/L (ref 135–145)
Sodium: 138 mEq/L (ref 135–145)
Total Bilirubin: 1.1 mg/dL (ref 0.3–1.2)
Total Bilirubin: 1.9 mg/dL — ABNORMAL HIGH (ref 0.3–1.2)
Total Bilirubin: 3.3 mg/dL — ABNORMAL HIGH (ref 0.3–1.2)
Total Protein: 5.1 g/dL — ABNORMAL LOW (ref 6.0–8.3)

## 2010-06-19 LAB — CBC
HCT: 40.3 % (ref 39.0–52.0)
MCHC: 34.2 g/dL (ref 30.0–36.0)
MCV: 87.4 fL (ref 78.0–100.0)

## 2010-06-19 LAB — LIPASE, BLOOD: Lipase: 36 U/L (ref 11–59)

## 2010-06-20 LAB — DIFFERENTIAL
Eosinophils Absolute: 0 10*3/uL (ref 0.0–0.7)
Neutrophils Relative %: 91 % — ABNORMAL HIGH (ref 43–77)

## 2010-06-20 LAB — CULTURE, BLOOD (ROUTINE X 2)

## 2010-06-20 LAB — HEPATIC FUNCTION PANEL
AST: 299 U/L — ABNORMAL HIGH (ref 0–37)
Albumin: 4 g/dL (ref 3.5–5.2)

## 2010-06-20 LAB — URINALYSIS, ROUTINE W REFLEX MICROSCOPIC
Nitrite: NEGATIVE
Protein, ur: NEGATIVE mg/dL
Urobilinogen, UA: 1 mg/dL (ref 0.0–1.0)

## 2010-06-20 LAB — GLUCOSE, CAPILLARY: Glucose-Capillary: 124 mg/dL — ABNORMAL HIGH (ref 70–99)

## 2010-06-20 LAB — BASIC METABOLIC PANEL
CO2: 27 mEq/L (ref 19–32)
Chloride: 106 mEq/L (ref 96–112)
Creatinine, Ser: 1.23 mg/dL (ref 0.4–1.5)
GFR calc Af Amer: >60 mL/min (ref >60)
Potassium: 4.1 mEq/L (ref 3.5–5.1)

## 2010-06-20 LAB — CBC
Hemoglobin: 14.9 g/dL (ref 13.0–17.0)
MCHC: 33.7 g/dL (ref 30.0–36.0)
WBC: 13.9 10*3/uL — ABNORMAL HIGH (ref 4.0–10.5)

## 2010-06-22 NOTE — Letter (Signed)
Summary: [facility]   [facility]   Imported By: Kassie Mends 06/15/2010 08:36:46  _____________________________________________________________________  External Attachment:    Type:   Image     Comment:   External Document  Appended Document: [facility]     Clinical Lists Changes  Observations: Added new observation of PAST MED HX: Diverticulosis, colon Hypertension Hypothyroidism Benign prostatic hypertrophy Gallstone pancreatitis and Ecoli bacteremia 2011 R renal mass 2011; Eval by [name]- echogenic mass on u/s.  Likely RCC per Uro  (06/15/2010 8:39)       Past History:  Past Medical History: Diverticulosis, colon Hypertension Hypothyroidism Benign prostatic hypertrophy Gallstone pancreatitis and Ecoli bacteremia 2011 R renal mass 2011; Eval by [name]- echogenic mass on u/s.  Likely RCC per Uro

## 2010-07-28 ENCOUNTER — Ambulatory Visit (HOSPITAL_COMMUNITY)
Admission: RE | Admit: 2010-07-28 | Discharge: 2010-07-28 | Disposition: A | Discharge status: Home / Self Care | Payer: Medicare PPO | Source: Ambulatory Visit | Attending: Gastroenterology | Admitting: Gastroenterology

## 2010-07-28 DIAGNOSIS — N400 Enlarged prostate without lower urinary tract symptoms: Secondary | ICD-10-CM | POA: Insufficient documentation

## 2010-07-28 DIAGNOSIS — I10 Essential (primary) hypertension: Secondary | ICD-10-CM | POA: Insufficient documentation

## 2010-07-28 DIAGNOSIS — K2270 Barrett's esophagus without dysplasia: Secondary | ICD-10-CM | POA: Insufficient documentation

## 2010-07-28 DIAGNOSIS — D126 Benign neoplasm of colon, unspecified: Secondary | ICD-10-CM | POA: Insufficient documentation

## 2010-08-04 ENCOUNTER — Encounter: Payer: Self-pay | Admitting: Family Medicine

## 2010-08-20 NOTE — Op Note (Signed)
[place]  Patient:    [name], [name] Visit Number: [id number] MRN: [id number]          Service Type: END Location: ENDO Attending Physician:  Rich Brave Proc. Date: 12/06/00 Admit Date:  12/06/2000   CC:         Loyal Jacobson, M.D.   Operative Report  PROCEDURE:  Upper endoscopy with biopsies.  INDICATIONS:  Follow-up of Barretts esophagus in a 77 year old.  FINDINGS:  Circumferential 3 cm segment of Barretts esophagus, about 5 cm hiatal hernia.  DESCRIPTION OF PROCEDURE:  The nature, purpose, and risks of the procedure were familiar to the patient from prior examination and provided written consent.  Sedation was fentanyl 35 mcg and Versed 4 mg IV without arrhythmias or desaturation.  The Olympus small caliber adult video endoscope was passed under direct vision.  The vocal cords were not well seen.  The esophagus was entered without difficulty and had normal proximal mucosa, but there was a 3 cm segment of circumferential Barretts esophagus arising above an esophageal ring at the level of the lower esophageal sphincter, without any evidence of tumor or mass effect, ulceration, or active esophagitis.  Some free reflux was noted during the examination.  There was a 5 cm hiatal hernia with a diaphragmatic hiatus located at about 35 cm.  The stomach contained no significant residual and had normal mucosa without evidence of gastritis, erosions, ulcers, polyps, or masses in the pyloris, duodenal bulb, and second duodenum looked normal.  The retroflexed view of the proximal stomach showed very patulous diaphragmatic hiatus with a large amount of mobility at the gastric mucosa prolapsing up into the hiatal hernia sac.  Multiple biopsies were obtained from the Barretts segment of the esophagus prior to removal of the scope.  The patient tolerated the procedure well and there were no apparent complications.  IMPRESSION: 1.  Short to medium segment of Barretts esophagus, biopsied. 2. Large hiatal hernia with patulous diaphragmatic hiatus.  PLAN:  Await pathology.  Possible surveillance endoscopy in about two years. Attending Physician:  Rich Brave DD:  12/06/00 TD:  12/06/00 Job: [phone] [id number]

## 2010-08-20 NOTE — Op Note (Signed)
NAME:  [name], [name] NO.:  [id number]   MEDICAL RECORD NO.:  [id number]                   PATIENT TYPE:  AMB   LOCATION:  [facility]                                 FACILITY:  [facility]   PHYSICIAN:  Bernette Redbird, M.D.                DATE OF BIRTH:  1930-11-08   DATE OF PROCEDURE:  11/12/2003  DATE OF DISCHARGE:                                 OPERATIVE REPORT   PROCEDURE:  Upper endoscopy with biopsy.   INDICATIONS FOR PROCEDURE:  Follow up of Barrett's esophagus with no  dysplasia on previous biopsy several years ago.   FINDINGS:  Small to medium size island of Barrett's mucosa in distal  esophagus, biopsied.  Esophageal ring.  Moderate hiatal hernia.   PROCEDURE:  The nature, purpose, and risks of the procedure have been  discussed with the patient previously and he was familiar with the procedure  and provided written consent.  Sedation was Fentanyl 50 mcg and Versed 5 mg  IV without arrhythmias or desaturations.  The Olympus video endoscope was  passed under direct vision.  The vocal cords were briefly seen and appeared  grossly normal.  The esophagus was entered without significant difficulty.  In the distal esophagus but not at the squamocolumnar junction, there was a  nearly circumferential band of tissue consistent with an esophageal ring.  Distal to this, a short distance above the squamocolumnar junction, was a 2  by 3 cm island of what appeared to be Barrett's mucosa without any mass  affect or evidence of neoplasia.  I did not see any active esophagitis, free  reflux, varices, or infection.  A roughly 5 cm hiatal hernia was present.  There Heringer have been a minimal esophageal ring right at the squamocolumnar  junction.  The stomach contained no significant residual and had normal  mucosa without evidence of gastritis, erosions, ulcers, polyps, or masses,  and a retroflex view of the proximal stomach showed a somewhat patulous  diaphragmatic  hiatus and a hiatal hernia from its inferior perspective.  The  pylorus, duodenal bulb, and second duodenum were unremarkable.  Biopsies  were obtained from the [place] of Barrett's mucosa prior to removal of the  scope.  The patient tolerated the procedure well and there were no apparent  complications.   IMPRESSION:  1. Island of Barrett's mucosa biopsied as described above (530.85).  2. Moderate size hiatal hernia.  3. Esophageal ring.   PLAN:  Await pathology results.                                               Bernette Redbird, M.D.    RB/MEDQ  D:  11/12/2003  T:  11/12/2003  Job:  [id number]   cc:   Laurita Quint, M.D.  [address] [place]  [place] [address]  Fax: [phone]

## 2010-08-20 NOTE — Op Note (Signed)
NAME:  [name], [name] NO.:  [id number]   MEDICAL RECORD NO.:  [id number]                   PATIENT TYPE:  AMB   LOCATION:  DFTL                                 FACILITY:  [facility]   PHYSICIAN:  Bernette Redbird, M.D.                DATE OF BIRTH:  07/30/1930   DATE OF PROCEDURE:  11/12/2003  DATE OF DISCHARGE:                                 OPERATIVE REPORT   DATE OF BIRTH:  1930/08/07.   PROCEDURE:  Colonoscopy.   INDICATIONS FOR PROCEDURE:  Prior history of colonic adenoma, with negative  surveillance examination five years ago.   FINDINGS:  Significant sigmoid diverticulosis.   DESCRIPTION OF PROCEDURE:  The nature, purpose and risks of the procedure  were familiar to the patient from prior examination and he provided written  consent.  Sedation for this procedure was Fentanyl 50 mcg and Versed 5 mg IV  which had been administered for the endoscopy which preceded this  examination.  No additional sedation was given.  There was no problem with  clinical instability.  Digital examination of the prostate was unremarkable.  The Olympus adult video colonoscope was advanced around the colon to the  cecum and advanced for very short distance into a normal-appearing terminal  ileum and pullback was then performed.  The quality of prep was quite good,  although there was a little bit of scattered vegetable debris here and there  which is felt would not likely have compromised the quality of the  examination by obscuring any significant lesions.   On pullback, I saw no evidence of polyps, cancer, colitis or vascular  ectasia.  There was moderately severe sigmoid diverticulosis present.   Retroflexion in the rectum and reinspection of the rectum was unremarkable.   No biopsies were obtained.  The patient tolerated the procedure well and  there were no apparent complications.   IMPRESSION:  1. Prior history of colonic adenoma, without worrisome  findings on current     examination (V12.72).  2. Sigmoid diverticulosis.   PLAN:  Follow-up colonoscopy in five years.                                               Bernette Redbird, M.D.    RB/MEDQ  D:  11/12/2003  T:  11/12/2003  Job:  [id number]   cc:   Laurita Quint, M.D.  [address] [place]  [place] [address]  Fax: [phone]

## 2010-08-20 NOTE — Op Note (Signed)
[place]. [facility]  Patient:    [name], [name] Visit Number: [id number] MRN: [id number]          Service Type: SUR Location: [id number] Attending Physician:  Georgena Spurling Dictated by:   Georgena Spurling, M.D. Proc. Date: 04/30/01 Admit Date:  04/30/2001                             Operative Report  PREOPERATIVE DIAGNOSIS:  Bilateral osteoarthritis of the knees.  POSTOPERATIVE DIAGNOSIS:  Bilateral osteoarthritis of the knees.  OPERATION PERFORMED:  Bilateral total knee replacements.  SURGEON:  Georgena Spurling, M.D.  ASSISTANT:  Jamelle Rushing, P.A.  ANESTHESIA:  General.  INDICATIONS FOR PROCEDURE:  The patient is a 77 year old white male status post failure of conservative management for arthritis of the knees and informed consent was obtained for knee replacements.  DESCRIPTION OF PROCEDURE:  The patient was laid supine and administered general endotracheal anesthesia.  This was in the supine position.  Both lower extremities were prepped and draped in the usual sterile fashion.  We exsanguinated the left extremity and did that one first.  We made an incision with a #10 blade, used a fresh blade and made a medial parapatellar arthrotomy and developed subperiosteal and medial sleeve off the crest of the tibia.  We everted the patella, calipered the patella at 22 mm.  Used the 32 mm reamer to ream it down to 13 mm.  We then cut the residual bone so it was a flat surface, templated and keeled with the 32 template.  We then placed the prosthetic trial in placed and calipered and this was also 22 mm.  We then removed the prosthetic trial, kept the patella everted, flexed the knee and delivered the tibia anteriorly after cutting the ACL and PCL.  We then used the tibial tower and made the cut perpendicular to the ____________ of the tibia and removed that bony piece with the permanent soft tissue attachments. We then made the intramedullary hole into  the femur and used an intramedullary guide that set on 6 degree valgus left.  We then placed a 3 degree cutting block onto that and pinned it into place and removed our intramedullary guide. We then made our distal femoral cut in 6 degrees of valgus.  We then measured our epicondylar axis, measured the posterior condylar angle at 3 degrees used the sizer guide and chose a size F.  We then pinned through the 3 degree holes and placed our 4 in 1 cutter block in place, screwed it into place and made our four cuts.  We then removed the 4 in 1 cutter.  We then used a lamina spreader in the lateral compartment and removed the ACL, PCL and the posterior condylar osteophytes.  We then removed the medial meniscus.  We then put the lamina spreader in the medial compartment and removed the lateral meniscus and posterior condylar osteophytes.  Once we had removed the ACL and PCL, we stripped a little bit of posterior capsule posteriorly.  At this point we used a spacer block which was 10 mm.  It had a good equal flexion and extension gaps.  We then irrigated the knee, then finished the femur with the box cut and lug holes and then finished the tibia with drilling and keeling with a size 5.  We then removed all of our finishing blocks, placed a trial F femur, 5 tibia, 10  mm spacer and 32 mm patella.  We had excellent flexion and extension balance and good tracking of the patella.  We removed the trials irrigated copiously, cemented in the 5 tibial tray first, patella second, femur third and then the polyethylene and reduced the knee while the cement hardened.  We removed excess cement, let the tourniquet down once it was hard and cauterized bleeding vessels.  We then left a Hemovac deep to the arthrotomy and closed the arthrotomy with interrupted #1 Vicryl sutures, closed the soft tissue with interrupted 0 Vicryl, subcuticular with 2-0 Vicryl and skin staples.  We then dressed the leg and turned our  attention to the right, where we exsanguinated the extremity and made a midline incision, clean median parapatellar arthrotomy with a #10 blade, did the patella first and used a 32 reamer and a 32 as well as we did with the left knee.  We then went to flexion, cut our tibia perpendicular to the anatomic axis.  There was more medial erosion on this knee.  We then found our intramedullary guide, tamped down our intramedullary guide, placed our 3 degree cutting block on the distal aspect for the distal femoral cut, pinned it into place, removed our guide and made our distal femoral cut.  We then used the sizer and pinned through the 3 degree holes with a size F block in place.  We used our 4 in 1 cutters to cut our anterior, posterior and chamfer cuts.  We then removed the ACL, PCL, medial and lateral meniscus and finished the femur and tibia with the finishing blocks.  With the F femur, 5 tibia, 10 mm spacer and 32 mm patella, we had excellent flexion and extension, gap balance.  We removed the trials, irrigated copiously and cemented the tibia first, patella second, femur third and placed snapping insert into place and reduce the knee in extension while the cement was hardening.  We then let down the tourniquet, checked for bleeders, left the Hemovac deep to the arthrotomy and closed the arthrotomy with interrupted #1 Vicryls, deep soft tissue with interrupted 0 Vicryls then a subcuticular 2-0 Vicryl and then skin staples.  We then dressed with Adaptic, 4 x 4, sterile Webril and stockings bilaterally.  Tourniquet time on the left was 48 mm.  Tourniquet time on the right was an hour and two minutes.  COMPLICATIONS:  None.  DRAINS:  Two Hemovacs.  ESTIMATED BLOOD LOSS:  Approximately 500 cc for the right and 500 cc to the left.Dictated by:   Georgena Spurling, M.D. Attending Physician:  Georgena Spurling DD:  04/30/01 TD:  04/30/01 Job: [id number]

## 2010-08-20 NOTE — Discharge Summary (Signed)
[place]. [facility]  Patient:    [name], CONRY Visit Number: [id number] MRN: [id number]          Service Type: [facility] Location: [phone] Attending Physician:  Faith Rogue T Dictated by:   Mcarthur Rossetti. Angiulli, P.A. Admit Date:  05/04/2001 Discharge Date: 05/06/2001   CC:         Georgena Spurling, M.D.  Laurita Quint, M.D.   Discharge Summary  DISCHARGE DIAGNOSES: 1. Bilateral total knee replacement on 04/30/01. 2. Anemia. 3. Hypertension. 4. Gastroesophageal reflux disease. 5. Hypothyroidism. 6. Benign prostatic hypertrophy.  HISTORY OF PRESENT ILLNESS:  The patient is a 77 year old white male with a history of bilateral knee pain secondary to end stage degenerative joint disease.  No relief with conservative care.  Underwent a bilateral total knee replacement on 04/30/01, per Dr. Sherlean Foot.  Placed on subcutaneous Lovenox for deep venous thrombosis prophylaxis, weightbearing as tolerated.  Pain management ongoing, postoperative narcotics adjusted secondary to confusion. Noted anemia, and transfused on 05/03/01.  Moderate to max assist for transfers.  Latest chemistries with a hemoglobin of 7.6, which the patient was transfused.  Chest x-ray with chronic changes.  Admitted for a comprehensive rehabilitation program.  PAST MEDICAL HISTORY:  See discharge diagnoses.  PAST SURGICAL HISTORY:  Appendectomy.  PRIMARY CARE PHYSICIAN:  Laurita Quint, M.D.  ALLERGIES:  SULFA.  HABITS:  No alcohol or tobacco.  ADMISSION MEDICATIONS: 1. Hytrin. 2. Synthroid. 3. Aciphex. 4. Aspirin 81 mg q.d. 5. Ginseng. 6. Multivitamin.  SOCIAL HISTORY:  Lives with wife in [place].  Independent prior to admission.  Retired one week ago.  One level home, three steps to entry.  Wife to assist on discharge.  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  HOSPITAL COURSE:  The patient did well while in rehabilitation services, with therapies initiated on a b.i.d. basis.  The following  issues were followed during the patients rehabilitation course.  Pertaining to Mr. Coia bilateral total knee replacement, it remained stable.  Surgical site healing nicely.  He would follow up with Dr. Sherlean Foot.  He was using CPM machine.  At times, he was refusing some of his therapies.  He was on subcutaneous Lovenox for deep venous thrombosis prophylaxis.  This would be continued on a home health basis after the patient requested discharge to home.  CPM machines were also advised for home, and had been arranged through [facility].  He would resume his aspirin therapy after Lovenox was completed.  Postoperative anemia with hemoglobin 9.6, hematocrit 28.  He continued on his iron supplement.  Blood pressures were controlled on Hytrin.  He continued on his hormone supplement for his hypothyroidism.  He was voiding without difficulty, had no gastrointestinal complaints.  Therapies were limited as the patient was refusing some of his therapies.  This was discussed with Dr. Riley Kill for planned discharge home.  He would receive a home health nurse, as well as physical therapy and occupational therapy.  It was advised to check a CBC on 05/08/01.  Also, continue Lovenox as advised, home CPM machine had been arranged.  DISCHARGE MEDICATIONS: 1. Lovenox 30 mg q.12h. x17 doses. 2. Trinsicon b.i.d. 3. Percocet one or two tablets p.o. q.6h. p.r.n. 4. Resume home medications.  [facility], [phone], for physical therapy and occupational therapy, as well as a nurse.  FOLLOWUP: 1. Dr. Hetty Ely in 2 to 4 weeks, medical management. 2. Dr. Sherlean Foot, removal of staples. Dictated by:   Mcarthur Rossetti. Angiulli, P.A. Attending Physician:  Faith Rogue  T DD:  05/06/01 TD:  05/07/01 Job: [id number]

## 2010-08-20 NOTE — H&P (Signed)
[place]. [facility]  Patient:    [name], [name] Visit Number: [id number] MRN: [id number]          Service Type: SUR Location: 5000 [id number] 01 Attending Physician:  Georgena Spurling Dictated by:   Jamelle Rushing, P.A. Admit Date:  04/30/2001                           History and Physical  DATE OF BIRTH:  Apr 18, 1930  CHIEF COMPLAINT:  Bilateral knee pain for several years.  HISTORY OF PRESENT ILLNESS:  Patient is a 77 year old white male who complains of bilateral knee pain for several years now.  Patient states initially the left knee was worse than the right.  He did have arthroscopic evaluation and debridement several years ago with no improvement of symptoms.  The patient has been having progressively worsening pain and mechanical symptoms in his left and right knee where both are equally problematic at this current time. Patient states he does have significant mechanical symptoms such as popping, clicking, and locking up in his knees.  The pain is predominantly located directly in the knees along the medial aspect.  He does have pain at night with sleeping and rolling over.  He does describe sharp shooting pains with awkward motions in the knees, without any radiation.  Patient does have a general soreness in bilateral calves.  Patient states that if he keeps his knee joints warm it does decrease some of the generalized aching.  X-ray evaluation of bilateral knees shows severe bone-on-bone medial compartment with significant osteophyte formation of bilateral knees.  DRUG ALLERGIES:  SULFA.  CURRENT MEDICATIONS: 1. Terazosin 5 mg. 2. Levothroid 100 mcg. 3. Aciphex 20 mg p.o. q.d. 4. Aspirin 81 mg p.o. q.d. 5. Multivitamin one capsule p.o. q.d. 6. Ginseng. 7. Saw palmetto. 8. Glucosamine.  PAST MEDICAL HISTORY: 1. Hypertension. 2. Thyroid disease. 3. Hiatal hernia. 4. Acid reflux.  The patient denies any diabetes, peptic ulcer, heart  disease, or any significant respiratory issues.  PAST SURGICAL HISTORY: 1. Thyroid surgery in 1965. 2. Appendectomy in 1961. 3. Left knee arthroscope roughly three years ago in [place].  Patient denies any complications with any of the above-mentioned surgical procedures.  SOCIAL HISTORY:  Patient is a 77 year old white male.  Denies any smoking or alcohol use.  The patient is currently married, he does have several grown children.  He lives in a [place] house with six steps into the main entrance.  He is currently employed as a Social worker.  The patient will be retired as of this coming Friday.  FAMILY PHYSICIAN:  Laurita Quint, M.D. at [phone].  FAMILY MEDICAL HISTORY:  Mother is deceased from a stroke.  Father is deceased from emphysema and coronary artery disease.  Patient has four brothers alive, three of them of which are in good health - one of them with a history of prostate cancer.  One sister alive and in good health.  REVIEW OF SYSTEMS:  Positive for upper dentures.  He does wear glasses at all times.  He does have a problem with occasional reflux but it is improved since being placed on Aciphex.  Patient denies any other problems related to general, sensory, respiratory, cardiac, GI, GU, hematologic, musculoskeletal, neurologic, or mental status issues that are pertinent at this time.  PHYSICAL EXAMINATION:  VITAL SIGNS:  Height is 5 feet 9 inches, weight 175 pounds.  Temperature 98.6, blood pressure 142/82,  respirations 12, pulse 80 and regular.  GENERAL:  This is a healthy-appearing white male who has well-developed upper body muscles.  Patient does ambulate without the use of a cane.  He has no obvious lower extremity deformities.  He is able to get on and off the exam table without much difficulty or significant discomfort.  HEENT:  Head was normocephalic, atraumatic, nontender over maxillary or frontal sinuses.  Pupils are equal,  round, and reactive, accommodating to light.  Extraocular movements intact, sclerae are not icteric, conjunctivae pink and moist.  External ears without deformities, canals patent, TMs pearly gray and intact.  Gross hearing is intact.  Nasal septum was deviated to the right, no obvious polyps.  Mucous membranes were pink and moist.  Oral buccal mucosa was pink and moist and without lesions.  Upper dentures were in place, lower dentition was in good repair.  Uvula was midline.  Patient was able to swallow without any difficulty.  NECK:  Supple.  No palpable lymphadenopathy.  Thyroid gland was nontender. Patient had good range of motion of his cervical spine without any difficulty or tenderness.  CHEST:  Lung sounds were clear and equal bilaterally.  No wheezes, rales, rhonchi, or rubs noted.  HEART:  Regular rate and rhythm, S1 and S2 was auscultated.  No murmurs, rubs, or gallops noted.  ABDOMEN:  Round, soft, nontender to deep palpation.  No hepatosplenomegaly. CVA was nontender to percussion.  Bowel sounds were normoactive throughout.  EXTREMITIES:  Upper extremities:  Patient had good range of motion of the right and left shoulders, elbows, and wrists without any difficulty.  Motor strength 5/5 in all muscle groups tested.  Lower extremities:  Right and left hip had excellent range of motion with full extension and flexion up to 120 degrees with 20 degrees internal-external rotation without any difficulty or mechanical symptoms.  Bilateral knees were symmetrically sized and shaped without any sign of erythema or ecchymosis. There was no palpable effusion.  Patient had significant medial joint line tenderness on bilateral knees.  He had about 115 degrees flexion with full extension.  No significant valgus-varus laxity or deformities.  Bilateral calves were nontender.  Bilateral ankles were symmetrical with good dorsi and plantar flexion.  PERIPHERAL VASCULATURE:  Carotid pulses  2+, no bruits.  Radial pulses 2+. Dorsalis pedis pulses and posterior tibial pulses were 1+.  Patient had no lee  or venous stasis changes or pigmentation changes.  NEUROLOGIC:  Patient was conscious, alert, and appropriate, held an easy conversation with the examiner.  Cranial nerves II-XII were grossly intact. Deep tendon reflexes of the upper and lower extremities were symmetrical right to left.  Patient was grossly intact to light touch sensation from head to toe.  BREAST, RECTAL, GENITOURINARY:  Deferred at this time.  LABORATORY DATA:  X-rays revealed severe medial joint collapse on right and left knees with significant osteophyte formation.  IMPRESSION: 1. End-stage osteoarthritis bilateral knees. 2. Hypertension. 3. Thyroid disease. 4. Hiatal hernia. 5. Reflux disease.  PLAN:  Patient will be admitted to [facility] under the care of Dr. Georgena Spurling on April 30, 2001.  The patient will undergo all routine labs and tests for planned bilateral total knee arthroplasty.  The patient has not donated any blood for this procedure. Dictated by:   Jamelle Rushing, P.A. Attending Physician:  Georgena Spurling DD:  04/24/01 TD:  04/25/01 Job: [id number]

## 2010-08-20 NOTE — Discharge Summary (Signed)
[place]  Patient:    KANAN, SOBEK Visit Number: [id number] MRN: [id number]          Service Type: [facility] Location: [phone] Attending Physician:  Faith Rogue T Dictated by:   Jamelle Rushing, P.A.-C Admit Date:  05/04/2001 Discharge Date: 05/06/2001   CC:         Laurita Quint, M.D.   Discharge Summary  ADMISSION DIAGNOSES: 1. Bilateral severe tricompartmental osteoarthritis of the knees. 2. Hypertension. 3. Reflux. 4. Thyroid disease. 5. History of hiatal hernia.  DISCHARGE DIAGNOSES: 1. Bilateral total knee arthroplasty. 2. Postoperative blood loss anemia. 3. Hypertension. 4. History of hiatal hernia. 5. Reflux disease. 6. Thyroid disease.  HISTORY OF PRESENT ILLNESS:  The patient is a 77 year old white male with a many year history of bilateral knee pains.  The patient has had an arthroscopic evaluation of his left knee several years ago without any improvement of his pain or mechanical symptoms.  The patient has a constant dull aching sensation of bilateral knees, which increases with time on his feet and the amount of activity.  He does have mechanical symptoms such as popping, grinding, and catching.  He has a severe sharp shooting pain with the catching sensation.  The pain improves only slightly with warmth.  X-rays show severe tricompartmental osteoarthritis.  ALLERGIES:  SULFA.  CURRENT MEDICATIONS: 1. ______ zosyn 5 mg p.o. q.d. 2. Levothroid 100 mcg p.o. q.d. 3. ______ 20 mg p.o. q.d. 4. Aspirin 81 mg p.o. q.d. 5. Multivitamin one tablet p.o. q.d. 6. Ginseng 160 mg p.o. q.d. 7. Saw palmetto 500 mg p.o. q.d. 8. Glucosamine.  SURGICAL PROCEDURE:  On April 30, 2001, the patient was taken to the OR by Dr. Georgena Spurling, assisted by Jamelle Rushing, P.A.-C.  Under general anesthesia, the patient underwent a bilateral total knee replacement.  There were no complications.  The patient tolerated the procedure  well.  One Hemovac drain was left in each knee.  Estimated blood loss was 500 cc from the right, and 500 cc from the left.  The patient tolerated the procedure well, had postoperative femoral nerve blocks placed for postoperative pain management.  HOSPITAL COURSE:  The patient was admitted to [facility] under the care of Dr. Georgena Spurling on April 30, 2001.  The patient was taken to the OR where he underwent a bilateral total knee arthroplasty.  The patient tolerated the procedure well, and was transferred to the recovery room and then to the orthopedic floor for routine postoperative care management.  The patient then incurred a total of 4 days postoperative care on the orthopedic floor.  The patient did have significant problems with pain, soreness, and swelling of bilateral knees on his first 2-1/2 postoperative days.  This was attempted to be managed with a PCA and Toradol, but the patient still had problems with this, and he was discontinued on these medications and transferred over to p.o. pain medications with significantly improved pain management.  The patient did develop some postoperative blood loss anemia with a hemoglobin dropping to 7.8, and he was typed and crossed and transfused 2 units of packed red blood cells with improvement.  The patients wounds remained benign for any signs of infection.  He remained afebrile.  His vital signs stable.  His legs remained neuromotor vascularly intact, and the patient did progress nicely once the pain was under control with the physical therapy to the point where he was able to be transferred  to the rehabilitation floor for postoperative rehabilitation management.  LABORATORY DATA:  EKG on admission was normal sinus rhythm at 62 beats per minute.  CBC on May 03, 2001, white blood cell count 7.0, hemoglobin 7.6, hematocrit 22.4.  The patient was typed and crossed and transfused 2 units of blood.  CBC was to be rechecked  the following day on the rehabilitation unit.  Routine chemistries on May 02, 2001, sodium 135, potassium 4.2, glucose 121, down from 157 the day previous, BUN 31, down from 32 the day previous, creatinine 1.1.  Routine urinalysis on admission was normal.  The patient received a total of 2 units of packed red blood cells during hospitalization.  DISCHARGE MEDICATIONS:  1. Colace 100 mg p.o. b.i.d.  2. Senokot one tablet p.o. b.i.d. a.c.  3. Trinsicon one capsule p.o. t.i.d. p.c.  4. Lovenox 30 mg subcutaneously q.12h.  5. Hytrin 5 mg p.o. q.h.s.  6. Synthroid 100 mcg p.o. q.d.  7. Protonix 40 mg p.o. q.d.  8. Laxative or enema of choice p.r.n..  9. Percocet one or two tablets q.4-6h. p.r.n. pain. 10. OxyContin CR 20 mg p.o. q.12h. 11. Continue medications dispensed on ortho floor.  ACTIVITY:  The patient Anzaldo be weightbearing as tolerated with the walker, close supervision bilateral legs.  DIET:  No restrictions.  WOUND CARE:  The patient is to have wounds checked daily for any signs of infection.  The patient is to have staples out on postoperative day #14.  Date of transfer is postoperative day #4.  DISCHARGE INSTRUCTIONS:  The patient is to continue Lovenox for a total of 14 days.  FOLLOWUP:  Dr. Sherlean Foot approximately two weeks from date of discharge from rehabilitation floor. Dictated by:   Jamelle Rushing, P.A.-C Attending Physician:  Faith Rogue T DD:  06/07/01 TD:  06/09/01 Job: [id number]

## 2010-09-02 ENCOUNTER — Encounter: Payer: Self-pay | Admitting: Family Medicine

## 2010-09-03 ENCOUNTER — Other Ambulatory Visit: Payer: Self-pay | Admitting: Family Medicine

## 2010-09-03 DIAGNOSIS — N400 Enlarged prostate without lower urinary tract symptoms: Secondary | ICD-10-CM

## 2010-09-03 DIAGNOSIS — I10 Essential (primary) hypertension: Secondary | ICD-10-CM

## 2010-09-03 DIAGNOSIS — E039 Hypothyroidism, unspecified: Secondary | ICD-10-CM

## 2010-09-06 ENCOUNTER — Other Ambulatory Visit (INDEPENDENT_AMBULATORY_CARE_PROVIDER_SITE_OTHER): Payer: Medicare PPO | Admitting: Family Medicine

## 2010-09-06 DIAGNOSIS — E039 Hypothyroidism, unspecified: Secondary | ICD-10-CM

## 2010-09-06 DIAGNOSIS — N400 Enlarged prostate without lower urinary tract symptoms: Secondary | ICD-10-CM

## 2010-09-06 DIAGNOSIS — I10 Essential (primary) hypertension: Secondary | ICD-10-CM

## 2010-09-06 LAB — COMPREHENSIVE METABOLIC PANEL
Albumin: 3.7 g/dL (ref 3.5–5.2)
Alkaline Phosphatase: 68 U/L (ref 39–117)
BUN: 20 mg/dL (ref 6–23)
Glucose, Bld: 97 mg/dL (ref 70–99)
Potassium: 4.8 mEq/L (ref 3.5–5.1)
Total Bilirubin: 0.9 mg/dL (ref 0.3–1.2)

## 2010-09-06 LAB — LIPID PANEL
Cholesterol: 125 mg/dL (ref 0–200)
LDL Cholesterol: 66 mg/dL (ref 0–99)
Triglycerides: 52 mg/dL (ref 0.0–149.0)

## 2010-09-07 ENCOUNTER — Encounter: Payer: Self-pay | Admitting: Family Medicine

## 2010-09-09 ENCOUNTER — Ambulatory Visit (INDEPENDENT_AMBULATORY_CARE_PROVIDER_SITE_OTHER): Payer: Medicare PPO | Admitting: Family Medicine

## 2010-09-09 ENCOUNTER — Encounter: Payer: Self-pay | Admitting: Family Medicine

## 2010-09-09 VITALS — BP 142/96 | HR 72 | Temp 97.7°F | Ht 68.0 in | Wt 162.0 lb

## 2010-09-09 DIAGNOSIS — E7800 Pure hypercholesterolemia, unspecified: Secondary | ICD-10-CM

## 2010-09-09 DIAGNOSIS — I10 Essential (primary) hypertension: Secondary | ICD-10-CM

## 2010-09-09 DIAGNOSIS — H532 Diplopia: Secondary | ICD-10-CM

## 2010-09-09 DIAGNOSIS — K2270 Barrett's esophagus without dysplasia: Secondary | ICD-10-CM

## 2010-09-09 DIAGNOSIS — N400 Enlarged prostate without lower urinary tract symptoms: Secondary | ICD-10-CM

## 2010-09-09 DIAGNOSIS — R5381 Other malaise: Secondary | ICD-10-CM

## 2010-09-09 DIAGNOSIS — G250 Essential tremor: Secondary | ICD-10-CM

## 2010-09-09 MED ORDER — LEVOTHYROXINE SODIUM 100 MCG PO TABS: 100.0000 ug | ORAL_TABLET | Freq: Every day | ORAL | Status: DC

## 2010-09-09 MED ORDER — PANTOPRAZOLE SODIUM 40 MG PO TBEC: 40.0000 mg | DELAYED_RELEASE_TABLET | Freq: Every day | ORAL | Status: DC

## 2010-09-09 MED ORDER — TERAZOSIN HCL 5 MG PO CAPS: 5.0000 mg | ORAL_CAPSULE | Freq: Every day | ORAL | Status: DC

## 2010-09-09 NOTE — Progress Notes (Signed)
Double vision, going on for about 1 year.  No recent changes in symptoms.  See plan.  Tremor, prev with neuro eval.  No other neuro symptoms.  Father had similar symptoms.  No change in the tremor.   Renal mass- followed by uro.    BPH- h/o, PSA d/w pt today.  See exam/plan.  HTN- doing well w/o CP.  No ble edema, complaint with meds.  Labs d/w pt and are at goal.   Pancreatitis.  Last year.  No abd pain but pt has been slow to regain his preop strength.  This is gradually getting better.   PMH and SH reviewed  ROS: See HPI, otherwise noncontributory.  Meds, vitals, and allergies reviewed.   GEN: nad, alert and oriented, sym hand tremor noted but no cogwheeling HEENT: mucous membranes moist NECK: supple w/o LA CV: rrr.   PULM: ctab, no inc wob ABD: soft, +bs EXT: no edema SKIN: no acute rash Prostate gland firm and smooth, no sig enlargement, nodularity, tenderness, mass, asymmetry or induration.

## 2010-09-09 NOTE — Patient Instructions (Addendum)
Check with your insurance to see if they will cover the shingles shot. I would get a flu shot each fall.   See Shirlee Limerick about your referral before your leave today. I didn't change your meds- I refilled them as is.   I would recheck your labs next year. Keep walking for exercise.  Take care.

## 2010-09-10 NOTE — Assessment & Plan Note (Signed)
Controlled, no change in meds.  D/w pt about diet and labs.

## 2010-09-10 NOTE — Assessment & Plan Note (Signed)
Per GI

## 2010-09-10 NOTE — Assessment & Plan Note (Signed)
Lipids at goal, no change in meds.  D/w pt about diet.

## 2010-09-10 NOTE — Assessment & Plan Note (Signed)
Benign exam, follow clinically .

## 2010-09-10 NOTE — Assessment & Plan Note (Signed)
psa not elevated and exam unremarkable.

## 2010-09-10 NOTE — Assessment & Plan Note (Signed)
Improving, TSH wnl.

## 2010-09-16 ENCOUNTER — Telehealth: Payer: Self-pay | Admitting: *Deleted

## 2010-09-16 NOTE — Telephone Encounter (Signed)
Please fax order for zostavax to pharmacy.  Thanks.

## 2010-09-16 NOTE — Telephone Encounter (Signed)
Pt would like an order for zostavax sent to [facility]  His insurance will pay if he gets it at a pharmacy.

## 2010-09-16 NOTE — Telephone Encounter (Signed)
Order for Zostavax called to pharmacy

## 2010-09-23 ENCOUNTER — Ambulatory Visit: Payer: Self-pay | Admitting: Ophthalmology

## 2010-12-16 ENCOUNTER — Encounter: Payer: Self-pay | Admitting: Family Medicine

## 2010-12-16 DIAGNOSIS — N2889 Other specified disorders of kidney and ureter: Secondary | ICD-10-CM | POA: Insufficient documentation

## 2011-01-25 ENCOUNTER — Ambulatory Visit (INDEPENDENT_AMBULATORY_CARE_PROVIDER_SITE_OTHER): Payer: Medicare PPO

## 2011-01-25 DIAGNOSIS — Z23 Encounter for immunization: Secondary | ICD-10-CM

## 2011-04-28 ENCOUNTER — Other Ambulatory Visit: Payer: Self-pay | Admitting: *Deleted

## 2011-04-28 MED ORDER — LEVOTHYROXINE SODIUM 100 MCG PO TABS: 100.0000 ug | ORAL_TABLET | Freq: Every day | ORAL | Status: DC

## 2011-05-01 ENCOUNTER — Encounter: Payer: Self-pay | Admitting: Family Medicine

## 2011-05-02 ENCOUNTER — Other Ambulatory Visit: Payer: Self-pay | Admitting: *Deleted

## 2011-05-02 MED ORDER — TERAZOSIN HCL 5 MG PO CAPS: 5.0000 mg | ORAL_CAPSULE | Freq: Every day | ORAL | Status: DC

## 2011-05-03 ENCOUNTER — Other Ambulatory Visit: Payer: Self-pay | Admitting: *Deleted

## 2011-05-03 MED ORDER — TERAZOSIN HCL 5 MG PO CAPS: 5.0000 mg | ORAL_CAPSULE | Freq: Every day | ORAL | Status: DC

## 2011-05-09 ENCOUNTER — Other Ambulatory Visit: Payer: Self-pay | Admitting: *Deleted

## 2011-05-09 MED ORDER — TERAZOSIN HCL 5 MG PO CAPS: 5.0000 mg | ORAL_CAPSULE | Freq: Every day | ORAL | Status: DC

## 2011-09-06 ENCOUNTER — Telehealth: Payer: Self-pay | Admitting: Family Medicine

## 2011-09-06 ENCOUNTER — Ambulatory Visit (INDEPENDENT_AMBULATORY_CARE_PROVIDER_SITE_OTHER): Payer: Medicare PPO | Admitting: Family Medicine

## 2011-09-06 ENCOUNTER — Encounter: Payer: Self-pay | Admitting: Family Medicine

## 2011-09-06 VITALS — BP 144/84 | HR 80 | Temp 97.8°F | Wt 168.0 lb

## 2011-09-06 DIAGNOSIS — R21 Rash and other nonspecific skin eruption: Secondary | ICD-10-CM

## 2011-09-06 MED ORDER — TRIAMCINOLONE ACETONIDE 0.1 % EX CREA: TOPICAL_CREAM | Freq: Two times a day (BID) | CUTANEOUS | Status: DC

## 2011-09-06 NOTE — Telephone Encounter (Signed)
Caller: Martha/Spouse; PCP: [name] Clelia Croft); CB#: [phone]; ; ; Call regarding Insect Bite L. Calf. Onset 2. Weeks Ago; initially thought it was an insect bite, but over past 2 weeks has not looked better, and onset 09/06/11 of blotchy redness and itchiness to the site.  Afebrile.  Per protocol, emergent symptoms denied; advised appt wtihin 24 hours.  Appt sched 1600 09/06/11 with Dr. Para March.

## 2011-09-06 NOTE — Telephone Encounter (Signed)
seeing patient today.

## 2011-09-06 NOTE — Patient Instructions (Signed)
Use the cream twice a day and let me know if that doesn't help.  Take care.

## 2011-09-07 DIAGNOSIS — R21 Rash and other nonspecific skin eruption: Secondary | ICD-10-CM | POA: Insufficient documentation

## 2011-09-07 NOTE — Assessment & Plan Note (Signed)
Not typical for a tick associated lesion and no worrisome history, ie fevers, joint aches, rash with central clearing.  No need to I&D, doesn't appear infectious.  Would treat the itching lesion on L shin and see if it self resolved.  Topical TAC, f/u prn.  He agrees.

## 2011-09-07 NOTE — Progress Notes (Signed)
Possible insect bite on R lower abd 2 months ago.  Still with some persistent irritation in the area.  No discharge or spreading erythema.  Now more recently with a lesion on L lateral shin.  Not painful or hot, but red and itching.  Unclear if there was an insect bite in the area.  No h/o engorged ticks.  No FCNAV.  Feeling well except for the itching at the L leg site.    Meds, vitals, and allergies reviewed.   ROS: See HPI.  Otherwise, noncontributory.  nad ncat Mmm rrr ctab abd soft not ttp, normal BS Ext w/o edema Skin with minimal irritation noted on R lower abd w/o fluctuance.  L lateral shin with blaching 10x6cm red area that isn't not.  No fluctuance.

## 2011-09-19 ENCOUNTER — Encounter: Payer: Self-pay | Admitting: Family Medicine

## 2011-09-21 ENCOUNTER — Other Ambulatory Visit: Payer: Self-pay | Admitting: Family Medicine

## 2011-09-22 ENCOUNTER — Encounter: Payer: Self-pay | Admitting: Family Medicine

## 2011-09-22 NOTE — Telephone Encounter (Signed)
Electronic refill request.  Patient has been in for acute visits.  Looks like last TSH was in 09/2010.  Please advise.

## 2011-09-22 NOTE — Telephone Encounter (Signed)
Has OV scheduled.  rx sent.  Thanks.

## 2011-10-26 ENCOUNTER — Other Ambulatory Visit: Payer: Self-pay | Admitting: Family Medicine

## 2011-10-26 DIAGNOSIS — E039 Hypothyroidism, unspecified: Secondary | ICD-10-CM

## 2011-10-26 DIAGNOSIS — I10 Essential (primary) hypertension: Secondary | ICD-10-CM

## 2011-10-31 ENCOUNTER — Other Ambulatory Visit (INDEPENDENT_AMBULATORY_CARE_PROVIDER_SITE_OTHER): Payer: Medicare PPO

## 2011-10-31 DIAGNOSIS — I10 Essential (primary) hypertension: Secondary | ICD-10-CM

## 2011-10-31 DIAGNOSIS — E039 Hypothyroidism, unspecified: Secondary | ICD-10-CM

## 2011-10-31 LAB — COMPREHENSIVE METABOLIC PANEL
ALT: 9 U/L (ref 0–53)
AST: 16 U/L (ref 0–37)
CO2: 27 mEq/L (ref 19–32)
Calcium: 9.6 mg/dL (ref 8.4–10.5)
Chloride: 104 mEq/L (ref 96–112)
GFR: 61.49 mL/min (ref >60.00)
Potassium: 4.3 mEq/L (ref 3.5–5.1)
Sodium: 139 mEq/L (ref 135–145)
Total Protein: 6.5 g/dL (ref 6.0–8.3)

## 2011-10-31 LAB — LIPID PANEL: Total CHOL/HDL Ratio: 3

## 2011-11-03 ENCOUNTER — Encounter: Payer: Self-pay | Admitting: Family Medicine

## 2011-11-03 ENCOUNTER — Ambulatory Visit (INDEPENDENT_AMBULATORY_CARE_PROVIDER_SITE_OTHER): Payer: Medicare PPO | Admitting: Family Medicine

## 2011-11-03 VITALS — BP 142/96 | HR 71 | Temp 97.7°F | Ht 71.0 in | Wt 165.0 lb

## 2011-11-03 DIAGNOSIS — N289 Disorder of kidney and ureter, unspecified: Secondary | ICD-10-CM

## 2011-11-03 DIAGNOSIS — N2889 Other specified disorders of kidney and ureter: Secondary | ICD-10-CM

## 2011-11-03 DIAGNOSIS — F329 Major depressive disorder, single episode, unspecified: Secondary | ICD-10-CM

## 2011-11-03 DIAGNOSIS — E039 Hypothyroidism, unspecified: Secondary | ICD-10-CM

## 2011-11-03 DIAGNOSIS — Z0000 Encounter for general adult medical examination without abnormal findings: Secondary | ICD-10-CM

## 2011-11-03 DIAGNOSIS — K2270 Barrett's esophagus without dysplasia: Secondary | ICD-10-CM

## 2011-11-03 MED ORDER — LEVOTHYROXINE SODIUM 100 MCG PO TABS: ORAL_TABLET | ORAL | Status: DC

## 2011-11-03 NOTE — Patient Instructions (Addendum)
Thyroid med: Take 1 tab a day except for 1/2 tab on Sunday.  Recheck blood test in 8 weeks.   Call the eye clinic about getting your lenses adjusted.   If you continue to have troubles, notify me.

## 2011-11-06 DIAGNOSIS — Z0000 Encounter for general adult medical examination without abnormal findings: Secondary | ICD-10-CM | POA: Insufficient documentation

## 2011-11-06 NOTE — Assessment & Plan Note (Signed)
Controlled w/o GERD sx on current meds.

## 2011-11-06 NOTE — Assessment & Plan Note (Signed)
Decrease replacement and recheck in 8 weeks.  He agrees.

## 2011-11-06 NOTE — Assessment & Plan Note (Signed)
Per uro.  I will defer to uro.

## 2011-11-06 NOTE — Progress Notes (Signed)
I have personally reviewed the Medicare Annual Wellness questionnaire and have noted 1. The patient's medical and social history 2. Their use of alcohol, tobacco or illicit drugs 3. Their current medications and supplements 4. The patient's functional ability including ADL's, fall risks, home safety risks and hearing or visual             impairment. 5. Diet and physical activities 6. Evidence for depression or mood disorders  The patients weight, height, BMI have been recorded in the chart and visual acuity is per eye clinic.  I have made referrals, counseling and provided education to the patient based review of the above and I have provided the pt with a written personalized care plan for preventive services.  See scanned forms.  Routine anticipatory guidance given to patient.  See health maintenance. Tetanus 2006 Flu yearly Shingles 2012 PNA 2000 Colonoscopy 2013 Prostate cancer screening d/w pt.  PSA prev wnl and no need to continue PSA screening.   Advance directive d/w pt.  Has a living will.   RCC per uro, observation per uro.  D/w pt.  He agrees.    H/o Barrett's, no sx with current meds and doing well.   H/o pancreatitis, now resolved and doing well.  No clear source seen, possible med induced.   Hypothyroid, over-replaced with current meds.  No ADE.  Doing well.  Labs d/w pt.   He has been more irritable and he attributes this to vision changes.  We talked about f/u with the eye clinic.    PMH and SH reviewed  Meds, vitals, and allergies reviewed.   ROS: See HPI.  Otherwise negative.    GEN: nad, alert and oriented HEENT: mucous membranes moist NECK: supple w/o LA CV: rrr. PULM: ctab, no inc wob ABD: soft, +bs EXT: no edema SKIN: no acute rash

## 2011-11-06 NOTE — Assessment & Plan Note (Signed)
No SI/HI.  This Dattilo be worse due to vision changes and he'll f/u with the eye clinic.

## 2012-01-04 ENCOUNTER — Other Ambulatory Visit (INDEPENDENT_AMBULATORY_CARE_PROVIDER_SITE_OTHER): Payer: Medicare PPO

## 2012-01-04 ENCOUNTER — Ambulatory Visit (INDEPENDENT_AMBULATORY_CARE_PROVIDER_SITE_OTHER): Payer: Medicare PPO

## 2012-01-04 DIAGNOSIS — E039 Hypothyroidism, unspecified: Secondary | ICD-10-CM

## 2012-01-04 DIAGNOSIS — Z23 Encounter for immunization: Secondary | ICD-10-CM

## 2012-01-04 LAB — TSH: TSH: 1.26 u[IU]/mL (ref 0.35–5.50)

## 2012-01-05 ENCOUNTER — Encounter: Payer: Self-pay | Admitting: *Deleted

## 2012-02-02 ENCOUNTER — Telehealth: Payer: Self-pay | Admitting: Family Medicine

## 2012-02-02 NOTE — Telephone Encounter (Signed)
Caller: Martha/Spouse; Patient Name: [name], [name]; PCP: [name] Clelia Croft) [facility]); Best Callback Phone Number: [phone]. Reports nasal congestion/runny nose, productive cough with yellow sputum, low grade fever at times. Afebrile now. Triaged per Upper Respiratory Infection guideline, disposition: see provider within 24 hours for "Productive cough with colored sputum." Appointment scheduled 02/03/12 at 10:45am with Dr. Para March. Care advice and call back parameters given per guideline. Wife verbalized understanding.

## 2012-02-03 ENCOUNTER — Ambulatory Visit (INDEPENDENT_AMBULATORY_CARE_PROVIDER_SITE_OTHER): Payer: Medicare PPO | Admitting: Family Medicine

## 2012-02-03 ENCOUNTER — Encounter: Payer: Self-pay | Admitting: Family Medicine

## 2012-02-03 VITALS — BP 124/80 | HR 82 | Temp 97.7°F | Wt 162.0 lb

## 2012-02-03 DIAGNOSIS — R05 Cough: Secondary | ICD-10-CM

## 2012-02-03 DIAGNOSIS — R059 Cough, unspecified: Secondary | ICD-10-CM

## 2012-02-03 MED ORDER — HYDROCODONE-HOMATROPINE 5-1.5 MG/5ML PO SYRP: 5.0000 mL | ORAL_SOLUTION | Freq: Three times a day (TID) | ORAL | Status: DC | PRN

## 2012-02-03 MED ORDER — AZITHROMYCIN 250 MG PO TABS: ORAL_TABLET | ORAL | Status: DC

## 2012-02-03 NOTE — Progress Notes (Signed)
Coughing and congestion and sputum for 2 weeks.  Now with discoloration of sputum.  Fatigued.  Coughing day and night.  Tried all/any otc cough medicine w/o relief.  No help with mucinex.  Wife thought he was going to get better but then he had more cough the next day.  Initially with fevers, none known recently.  No myalgias, no more than typical.  He isn't sob.  No ear pain. Had some ST initially and the cough exacerbated this.    Meds, vitals, and allergies reviewed.   ROS: See HPI.  Otherwise, noncontributory.  GEN: nad, alert and oriented HEENT: mucous membranes moist, tm w/o erythema, nasal exam w/o erythema, scant clear discharge noted,  OP with mild cobblestoning NECK: supple w/o LA CV: rrr.   PULM: ctab except for scant changes in the bases bilaterally- slightly more coarse than upper fields but no inc wob EXT: no edema SKIN: no acute rash

## 2012-02-03 NOTE — Patient Instructions (Addendum)
Start the antibiotics today and use the cough medicine as needed- it can make you drowsy.  Take care.  Notify us if not improved.

## 2012-02-05 DIAGNOSIS — R05 Cough: Secondary | ICD-10-CM | POA: Insufficient documentation

## 2012-02-05 NOTE — Assessment & Plan Note (Signed)
Given the duration and the sputum production, would treat for presumed bronchitis and have pt f/u prn.  Nontoxic.  He agrees.  Okay for outpatient f/u.

## 2012-04-09 ENCOUNTER — Telehealth: Payer: Self-pay | Admitting: Family Medicine

## 2012-04-09 NOTE — Telephone Encounter (Signed)
Noted. Will see then.  

## 2012-04-09 NOTE — Telephone Encounter (Signed)
Patient Information:  Caller Name: [name]  Phone: [phone]  Patient: [name], [name]  Gender: Male  DOB: 1930-07-31  Age: 79 Years  PCP: [name] Clelia Croft) [facility])  Office Follow Up:  Does the office need to follow up with this patient?: No  Instructions For The Office: N/A  RN Note:  Clear productive cough with ongoing sore throat. Temp not taken during the night. Afebrile/tactile now. Drank no fluids yet this morning (now 1130)because has been in bed until 1030.  Voided at 1030. Requested appointment at same time as wife; Dr Para March is not available until later; scheduled for 0815 04/10/12 with Dr Sharen Hones per caller request.  Symptoms  Reason For Call & Symptoms: Productive cough with congestion and sore throat  Reviewed Health History In EMR: Yes  Reviewed Medications In EMR: Yes  Reviewed Allergies In EMR: Yes  Reviewed Surgeries / Procedures: Yes  Date of Onset of Symptoms: 04/06/2012  Treatments Tried: otc combination cold/cough remedy  Treatments Tried Worked: No  Any Fever: Yes  Fever Taken: Tactile  Fever Time Of Reading: 04:00:00  Fever Last Reading: N/A  Guideline(s) Used:  Cough  Disposition Per Guideline:   See Today or Tomorrow in Office  Reason For Disposition Reached:   Patient wants to be seen  Advice Given:  Reassurance  Coughing is the way that our lungs remove irritants and mucus. It helps protect our lungs from getting pneumonia.  You can get a dry hacking cough after a chest cold. Sometimes this type of cough can last 1-3 weeks, and be worse at night.  Here is some care advice that should help.  Cough Medicines:  OTC Cough Drops: Cough drops can help a lot, especially for mild coughs. They reduce coughing by soothing your irritated throat and removing that tickle sensation in the back of the throat. Cough drops also have the advantage of portability - you can carry them with you.  Home Remedy - Hard Candy: Hard candy works just as  well as medicine-flavored OTC cough drops. Diabetics should use sugar-free candy.  Home Remedy - Honey: This old home remedy has been shown to help decrease coughing at night. The adult dosage is 2 teaspoons (10 ml) at bedtime. Honey should not be given to infants under one year of age.  Expected Course:   The expected course depends on what is causing the cough.  Viral bronchitis (chest cold) causes a cough that lasts 1 to 3 weeks. Sometimes you [place] cough up lots of phlegm (sputum, mucus). The mucus can normally be white, gray, yellow, or green.  Call Back If:  Difficulty breathing  Cough lasts more than 3 weeks  Fever lasts > 3 days  You become worse.  Appointment Scheduled:  04/10/2012 08:15:00 Appointment Scheduled Provider:  Eustaquio Boyden [facility])

## 2012-04-10 ENCOUNTER — Encounter: Payer: Self-pay | Admitting: Family Medicine

## 2012-04-10 ENCOUNTER — Ambulatory Visit (INDEPENDENT_AMBULATORY_CARE_PROVIDER_SITE_OTHER): Payer: Medicare PPO | Admitting: Family Medicine

## 2012-04-10 VITALS — BP 118/76 | HR 90 | Temp 97.5°F | Wt 168.2 lb

## 2012-04-10 DIAGNOSIS — R05 Cough: Secondary | ICD-10-CM

## 2012-04-10 DIAGNOSIS — R059 Cough, unspecified: Secondary | ICD-10-CM

## 2012-04-10 MED ORDER — HYDROCODONE-HOMATROPINE 5-1.5 MG/5ML PO SYRP: 5.0000 mL | ORAL_SOLUTION | Freq: Every evening | ORAL | Status: DC | PRN

## 2012-04-10 MED ORDER — AZITHROMYCIN 250 MG PO TABS: ORAL_TABLET | ORAL | Status: DC

## 2012-04-10 MED ORDER — HYDROCODONE-HOMATROPINE 5-1.5 MG/5ML PO SYRP: 5.0000 mL | ORAL_SOLUTION | Freq: Three times a day (TID) | ORAL | Status: DC | PRN

## 2012-04-10 NOTE — Progress Notes (Signed)
  Subjective:    Patient ID: [name], male    DOB: 08-09-1930, 79 y.o.   MRN: [id number]  HPI CC: cough, ST  1+ wk h/o cough that started with cold sxs, ST and cough.  Cough progressively worsening.  2 nights ago with subjective fever.  Fatigued, "sapped", malaise.  Not sudden onset sxs.  Cough productive of white phlegm.  Feels wheezy.  Chest > head congestion.  Mild PNDrainage.  Staying in bed all day  So far has tried OTC meds.  No abd pain, chest pain or SOB.  No HA, ear pain.  Treated for bronchitis 2 mo ago with zpack.  No h/o asthma.  No h/o COPD. No smokers at home.  Never smoker. Wife sick as well.  Past Medical History  Diagnosis Date  . Hypertension   . Hypothyroidism   . Diverticulosis of colon   . BPH (benign prostatic hypertrophy)   . Pancreatitis 2011    and gallstone pancreatitis and Ecoli bacteremia  . Renal mass 2011    Eval by Dr [name] echogenic mass on u/s. followed by [name]- observation as of 09/2010  . Rabbit fever 1940    Review of Systems Per HPI    Objective:   Physical Exam  Nursing note and vitals reviewed. Constitutional: He appears well-developed and well-nourished. No distress.       Somewhat hard of hearing  HENT:  Head: Normocephalic and atraumatic.  Right Ear: Hearing, tympanic membrane, external ear and ear canal normal.  Left Ear: Hearing, tympanic membrane, external ear and ear canal normal.  Nose: No mucosal edema or rhinorrhea. Right sinus exhibits frontal sinus tenderness. Right sinus exhibits no maxillary sinus tenderness. Left sinus exhibits frontal sinus tenderness. Left sinus exhibits no maxillary sinus tenderness.  Mouth/Throat: Uvula is midline and mucous membranes are normal. Posterior oropharyngeal erythema present. No oropharyngeal exudate, posterior oropharyngeal edema or tonsillar abscesses.       Mild frontal discomfort to palpation  Eyes: Conjunctivae normal and EOM are normal. Pupils are equal, Wilkinson, and  reactive to light. No scleral icterus.  Neck: Normal range of motion. Neck supple.  Cardiovascular: Normal rate, regular rhythm, normal heart sounds and intact distal pulses.   No murmur heard. Pulmonary/Chest: Effort normal and breath sounds normal. No respiratory distress. He has no wheezes. He has no rales.       Good air movement throughouth  Lymphadenopathy:    He has no cervical adenopathy.  Skin: Skin is warm and dry. No rash noted.  Psychiatric: He has a normal mood and affect.       Assessment & Plan:

## 2012-04-10 NOTE — Assessment & Plan Note (Addendum)
Given age, comorbidities, significant fatigue, will treat with azithromycin to cover atypical bronchitis. Red flags to return discussed.

## 2012-04-10 NOTE — Patient Instructions (Signed)
You have a bronchitis. Use medication as prescribed:  Zpack.  Hycodan for cough Bernier use simple mucinex (guaifenesin) with plenty of fluid to help mobilize mucous. Push fluids and plenty of rest. Please return if you are not improving as expected, or if you have high fevers (>101.5) or difficulty swallowing or worsening productive cough. Call clinic with questions.  Good to see you today.

## 2012-04-29 ENCOUNTER — Encounter: Payer: Self-pay | Admitting: Family Medicine

## 2012-04-29 DIAGNOSIS — R911 Solitary pulmonary nodule: Secondary | ICD-10-CM | POA: Insufficient documentation

## 2012-05-04 ENCOUNTER — Telehealth: Payer: Self-pay | Admitting: Family Medicine

## 2012-05-04 ENCOUNTER — Other Ambulatory Visit: Payer: Self-pay | Admitting: Family Medicine

## 2012-05-04 DIAGNOSIS — R911 Solitary pulmonary nodule: Secondary | ICD-10-CM

## 2012-05-04 NOTE — Telephone Encounter (Signed)
I saw the uro note about the pulmonary nodule.  I would follow this with CT in 3 months.  The order is in.  Please call pt about this and notify [facility].  Please send a copy of this note to urology.  Thanks.

## 2012-05-04 NOTE — Telephone Encounter (Signed)
LMOVM to return call.

## 2012-05-10 NOTE — Telephone Encounter (Signed)
Finally spoke with wife and she says [name] scheduled a repeat CT in 6 months.  Do you feel it needs to be sooner?

## 2012-05-10 NOTE — Telephone Encounter (Signed)
I agree.  CT chest in 3 months.  Order should be in.  Please d/w Shirlee Limerick.  Thanks.

## 2012-05-10 NOTE — Telephone Encounter (Signed)
[name] has already called Urology.  The patient has a 6 mos follow up OV with them but no CT scheduled at all.  It looks like on the scanned Urology note that I called and they asked that we follow the pulmonary nodule.  Do you agree?  If so, then we can schedule it at whatever interval you feel necessary because no other scan is scheduled.

## 2012-05-10 NOTE — Telephone Encounter (Signed)
[name] was about to call them and wife saw that I had sent this back to you.  What does she need to do?

## 2012-05-10 NOTE — Telephone Encounter (Signed)
Please call urology.  I suspect that the CT per urology would be of the abdomen.  If so, then I would get the chest CT in 3 months as planned.  However, if Dr [name] prefers to wait on both and have the chest and abd CT done at the same time (ie in 6 months), then I will defer to him.  I want to make sure that he does have follow up of the lung nodule, either through [place] or uro. Thanks.

## 2012-05-10 NOTE — Telephone Encounter (Signed)
Marion alerted.

## 2012-06-06 ENCOUNTER — Other Ambulatory Visit: Payer: Self-pay | Admitting: Family Medicine

## 2012-06-06 NOTE — Telephone Encounter (Signed)
[facility] has requested refill Hytrin. I can not tell if for hypertension or BPH; both on med list.Please advise.

## 2012-06-06 NOTE — Telephone Encounter (Signed)
Sent!

## 2012-07-11 ENCOUNTER — Observation Stay (HOSPITAL_COMMUNITY)
Admission: EM | Admit: 2012-07-11 | Discharge: 2012-07-13 | Disposition: A | Discharge status: Home / Self Care | Payer: Medicare PPO | Attending: Internal Medicine | Admitting: Internal Medicine

## 2012-07-11 ENCOUNTER — Encounter (HOSPITAL_COMMUNITY): Payer: Self-pay | Admitting: Emergency Medicine

## 2012-07-11 ENCOUNTER — Observation Stay (HOSPITAL_COMMUNITY): Payer: Medicare PPO

## 2012-07-11 ENCOUNTER — Emergency Department (HOSPITAL_COMMUNITY): Payer: Medicare PPO

## 2012-07-11 DIAGNOSIS — R29818 Other symptoms and signs involving the nervous system: Secondary | ICD-10-CM

## 2012-07-11 DIAGNOSIS — K869 Disease of pancreas, unspecified: Secondary | ICD-10-CM

## 2012-07-11 DIAGNOSIS — R32 Unspecified urinary incontinence: Secondary | ICD-10-CM

## 2012-07-11 DIAGNOSIS — K5730 Diverticulosis of large intestine without perforation or abscess without bleeding: Secondary | ICD-10-CM

## 2012-07-11 DIAGNOSIS — F3289 Other specified depressive episodes: Secondary | ICD-10-CM

## 2012-07-11 DIAGNOSIS — R5383 Other fatigue: Secondary | ICD-10-CM

## 2012-07-11 DIAGNOSIS — R059 Cough, unspecified: Secondary | ICD-10-CM

## 2012-07-11 DIAGNOSIS — E039 Hypothyroidism, unspecified: Secondary | ICD-10-CM

## 2012-07-11 DIAGNOSIS — H538 Other visual disturbances: Secondary | ICD-10-CM

## 2012-07-11 DIAGNOSIS — I6350 Cerebral infarction due to unspecified occlusion or stenosis of unspecified cerebral artery: Secondary | ICD-10-CM

## 2012-07-11 DIAGNOSIS — R911 Solitary pulmonary nodule: Secondary | ICD-10-CM

## 2012-07-11 DIAGNOSIS — G319 Degenerative disease of nervous system, unspecified: Secondary | ICD-10-CM

## 2012-07-11 DIAGNOSIS — R2689 Other abnormalities of gait and mobility: Secondary | ICD-10-CM

## 2012-07-11 DIAGNOSIS — J320 Chronic maxillary sinusitis: Secondary | ICD-10-CM | POA: Insufficient documentation

## 2012-07-11 DIAGNOSIS — R9409 Abnormal results of other function studies of central nervous system: Secondary | ICD-10-CM | POA: Insufficient documentation

## 2012-07-11 DIAGNOSIS — N2889 Other specified disorders of kidney and ureter: Secondary | ICD-10-CM

## 2012-07-11 DIAGNOSIS — K2270 Barrett's esophagus without dysplasia: Secondary | ICD-10-CM

## 2012-07-11 DIAGNOSIS — N508 Other specified disorders of male genital organs: Secondary | ICD-10-CM

## 2012-07-11 DIAGNOSIS — E7800 Pure hypercholesterolemia, unspecified: Secondary | ICD-10-CM

## 2012-07-11 DIAGNOSIS — I499 Cardiac arrhythmia, unspecified: Secondary | ICD-10-CM

## 2012-07-11 DIAGNOSIS — I672 Cerebral atherosclerosis: Secondary | ICD-10-CM

## 2012-07-11 DIAGNOSIS — I10 Essential (primary) hypertension: Secondary | ICD-10-CM

## 2012-07-11 DIAGNOSIS — G250 Essential tremor: Secondary | ICD-10-CM

## 2012-07-11 DIAGNOSIS — G459 Transient cerebral ischemic attack, unspecified: Secondary | ICD-10-CM

## 2012-07-11 DIAGNOSIS — Z9189 Other specified personal risk factors, not elsewhere classified: Secondary | ICD-10-CM

## 2012-07-11 DIAGNOSIS — G252 Other specified forms of tremor: Secondary | ICD-10-CM | POA: Diagnosis present

## 2012-07-11 DIAGNOSIS — R21 Rash and other nonspecific skin eruption: Secondary | ICD-10-CM

## 2012-07-11 DIAGNOSIS — F329 Major depressive disorder, single episode, unspecified: Secondary | ICD-10-CM

## 2012-07-11 DIAGNOSIS — Z0000 Encounter for general adult medical examination without abnormal findings: Secondary | ICD-10-CM

## 2012-07-11 DIAGNOSIS — N400 Enlarged prostate without lower urinary tract symptoms: Secondary | ICD-10-CM

## 2012-07-11 DIAGNOSIS — I517 Cardiomegaly: Secondary | ICD-10-CM | POA: Insufficient documentation

## 2012-07-11 DIAGNOSIS — I639 Cerebral infarction, unspecified: Secondary | ICD-10-CM

## 2012-07-11 DIAGNOSIS — R5381 Other malaise: Principal | ICD-10-CM

## 2012-07-11 DIAGNOSIS — R05 Cough: Secondary | ICD-10-CM

## 2012-07-11 DIAGNOSIS — I4891 Unspecified atrial fibrillation: Secondary | ICD-10-CM

## 2012-07-11 DIAGNOSIS — R269 Unspecified abnormalities of gait and mobility: Secondary | ICD-10-CM | POA: Insufficient documentation

## 2012-07-11 DIAGNOSIS — G3184 Mild cognitive impairment, so stated: Secondary | ICD-10-CM | POA: Insufficient documentation

## 2012-07-11 LAB — RAPID URINE DRUG SCREEN, HOSP PERFORMED
Amphetamines: NOT DETECTED
Benzodiazepines: NOT DETECTED
Opiates: NOT DETECTED

## 2012-07-11 LAB — POCT I-STAT, CHEM 8
BUN: 17 mg/dL (ref 6–23)
Calcium, Ion: 1.17 mmol/L (ref 1.13–1.30)
Chloride: 105 mEq/L (ref 96–112)
Potassium: 4.1 mEq/L (ref 3.5–5.1)

## 2012-07-11 LAB — DIFFERENTIAL
Basophils Absolute: 0 10*3/uL (ref 0.0–0.1)
Lymphocytes Relative: 16 % (ref 12–46)
Lymphs Abs: 1.1 10*3/uL (ref 0.7–4.0)
Neutro Abs: 5.2 10*3/uL (ref 1.7–7.7)
Neutrophils Relative %: 73 % (ref 43–77)

## 2012-07-11 LAB — CBC
HCT: 42 % (ref 39.0–52.0)
MCV: 82.1 fL (ref 78.0–100.0)
MCV: 83.2 fL (ref 78.0–100.0)
Platelets: 209 10*3/uL (ref 150–400)
RBC: 5.2 MIL/uL (ref 4.22–5.81)
RBC: 5.05 MIL/uL (ref 4.22–5.81)
RDW: 13.3 % (ref 11.5–15.5)
WBC: 7.1 10*3/uL (ref 4.0–10.5)
WBC: 7 10*3/uL (ref 4.0–10.5)

## 2012-07-11 LAB — COMPREHENSIVE METABOLIC PANEL
AST: 19 U/L (ref 0–37)
CO2: 28 mEq/L (ref 19–32)
Calcium: 9.2 mg/dL (ref 8.4–10.5)
Chloride: 105 mEq/L (ref 96–112)
Glucose, Bld: 91 mg/dL (ref 70–99)
Potassium: 4.1 mEq/L (ref 3.5–5.1)
Sodium: 140 mEq/L (ref 135–145)

## 2012-07-11 LAB — FOLATE: Folate: >20 ng/mL

## 2012-07-11 LAB — URINALYSIS, ROUTINE W REFLEX MICROSCOPIC
Glucose, UA: NEGATIVE mg/dL
Hgb urine dipstick: NEGATIVE
Ketones, ur: NEGATIVE mg/dL
Protein, ur: NEGATIVE mg/dL
pH: 7 (ref 5.0–8.0)

## 2012-07-11 LAB — APTT: aPTT: 36 seconds (ref 24–37)

## 2012-07-11 LAB — ETHANOL: Alcohol, Ethyl (B): <11 mg/dL (ref 0–11)

## 2012-07-11 LAB — RPR: RPR Ser Ql: NONREACTIVE

## 2012-07-11 LAB — CREATININE, SERUM: GFR calc Af Amer: 65 mL/min — ABNORMAL LOW (ref >90)

## 2012-07-11 LAB — POCT I-STAT TROPONIN I: Troponin i, poc: 0 ng/mL (ref 0.00–0.08)

## 2012-07-11 LAB — GLUCOSE, CAPILLARY: Glucose-Capillary: 90 mg/dL (ref 70–99)

## 2012-07-11 LAB — TROPONIN I
Troponin I: <0.3 ng/mL (ref <0.30)
Troponin I: <0.3 ng/mL (ref <0.30)

## 2012-07-11 LAB — VITAMIN B12: Vitamin B-12: 792 pg/mL (ref 211–911)

## 2012-07-11 LAB — PROTIME-INR: Prothrombin Time: 12.6 seconds (ref 11.6–15.2)

## 2012-07-11 MED ORDER — SUPER B COMPLEX PO TABS: 1.0000 | ORAL_TABLET | Freq: Every day | ORAL | Status: DC

## 2012-07-11 MED ORDER — ASPIRIN 81 MG PO CHEW
324.0000 mg | CHEWABLE_TABLET | Freq: Once | ORAL | Status: AC
  Administered 2012-07-11: 324 mg via ORAL
  Filled 2012-07-11: qty 4

## 2012-07-11 MED ORDER — PANTOPRAZOLE SODIUM 40 MG PO TBEC
40.0000 mg | DELAYED_RELEASE_TABLET | Freq: Every day | ORAL | Status: DC
  Administered 2012-07-11 – 2012-07-12 (×2): 40 mg via ORAL
  Filled 2012-07-11: qty 1

## 2012-07-11 MED ORDER — ENOXAPARIN SODIUM 40 MG/0.4ML ~~LOC~~ SOLN
40.0000 mg | SUBCUTANEOUS | Status: DC
  Administered 2012-07-11 – 2012-07-12 (×2): 40 mg via SUBCUTANEOUS
  Filled 2012-07-11 (×3): qty 0.4

## 2012-07-11 MED ORDER — ONDANSETRON HCL 4 MG/2ML IJ SOLN: 4.0000 mg | Freq: Three times a day (TID) | INTRAMUSCULAR | Status: AC | PRN

## 2012-07-11 MED ORDER — SODIUM CHLORIDE 0.9 % IV SOLN
INTRAVENOUS | Status: DC
  Administered 2012-07-11: 21:00:00 via INTRAVENOUS

## 2012-07-11 MED ORDER — SENNOSIDES-DOCUSATE SODIUM 8.6-50 MG PO TABS: 1.0000 | ORAL_TABLET | Freq: Every evening | ORAL | Status: DC | PRN

## 2012-07-11 MED ORDER — TERAZOSIN HCL 5 MG PO CAPS
5.0000 mg | ORAL_CAPSULE | Freq: Every day | ORAL | Status: DC
  Administered 2012-07-11 – 2012-07-12 (×2): 5 mg via ORAL
  Filled 2012-07-11 (×3): qty 1

## 2012-07-11 MED ORDER — LEVOTHYROXINE SODIUM 100 MCG PO TABS
100.0000 ug | ORAL_TABLET | Freq: Every day | ORAL | Status: DC
Start: 2012-07-12 — End: 2012-07-13
  Administered 2012-07-12 – 2012-07-13 (×2): 100 ug via ORAL
  Filled 2012-07-11 (×3): qty 1

## 2012-07-11 MED ORDER — ASPIRIN EC 325 MG PO TBEC
325.0000 mg | DELAYED_RELEASE_TABLET | Freq: Every day | ORAL | Status: DC
  Administered 2012-07-11 – 2012-07-13 (×3): 325 mg via ORAL
  Filled 2012-07-11 (×3): qty 1

## 2012-07-11 MED ORDER — B COMPLEX-C PO TABS
1.0000 | ORAL_TABLET | Freq: Every day | ORAL | Status: DC
  Administered 2012-07-11 – 2012-07-12 (×2): 1 via ORAL
  Filled 2012-07-11 (×3): qty 1

## 2012-07-11 MED ORDER — ADULT MULTIVITAMIN W/MINERALS CH
1.0000 | ORAL_TABLET | Freq: Every day | ORAL | Status: DC
  Administered 2012-07-11 – 2012-07-13 (×3): 1 via ORAL
  Filled 2012-07-11 (×3): qty 1

## 2012-07-11 NOTE — ED Notes (Signed)
Floor called to give report, receiving RN Ayaba will call back in 10 minute.

## 2012-07-11 NOTE — H&P (Addendum)
[facility] History and Physical  Kelwin Gibler Schum QMV:[id number] DOB: Oct 17, 1930 DOA: 07/11/2012  Referring physician:  Eber Hong PCP:  [name], MD   Chief Complaint:  Off balance  HPI:  The patient is a 79 y.o. year-old male with history of HTN, hypothyroidism, BPH, eye muscle problem s/p surgery at [facility], renal mass being followed by [name] who presents with off balance sensation and blurry vision.  The patient was last at their baseline health all day yesterday and when he went to bed.  He states that he ate and drank normally and did not feel ill in any way.  This morning possibly around 6AM, although hard to pin down a time, he awoke, and felt the urge to urinate.  He swung his legs over the side of the bed and attempted to stand up, however, his balance was off and he lurched forward and slightly to the left into the dresser.  He had to hold onto the wall to get to the bathroom a few feet away.  He states his vision was somehow blurry or "not normal."  He has had vertigo in the past with room spinning sensation and nausea and vomiting, however, this sensation was completely different.  He also denies presyncopal sensation and states that his symptom of blurry visions is constant and unchanging regardless of whether he sits up or stands up.  He denies focal weakness, numbness, slurred speech, confusion.  He continues to be off balance and have difficulty looking superiorly in the ER.  He was not given TPA in the ER due to out of window for administration/unclear onset of symptoms.  In the ER, his labs were essentially normal.  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  No acute strokes were seen on the CT scan.  He was seen by Neurology who recommended admission for rule out stroke.    Review of Systems:  Denies fevers, chills, weight loss  or gain, changes to hearing and vision.  Has some baseline hearing loss.  Denies rhinorrhea, sinus congestion, sore throat.  Denies chest pain and palpitations.  Denies SOB, wheezing, cough.  Denies nausea, vomiting, constipation, diarrhea.  Denies dysuria, frequency, urgency, polyuria, polydipsia.  Denies hematemesis, blood in stools, melena, abnormal bruising or bleeding.  Denies lymphadenopathy.  Denies arthralgias, myalgias.  Denies skin rash or ulcer.  Denies lower extremity edema.  Denies focal numbness, weakness, slurred speech, confusion, facial droop.  Denies anxiety and depression.    Past Medical History  Diagnosis Date  . Hypertension   . Hypothyroidism   . Diverticulosis of colon   . BPH (benign prostatic hypertrophy)   . Pancreatitis 2011    and gallstone pancreatitis and Ecoli bacteremia  . Renal mass 2011    Eval by Dr [name] echogenic mass on u/s. followed by Darlen Round- observation as of 09/2010  . Rabbit fever 1940   Past Surgical History  Procedure Laterality Date  . Appendectomy  1959  . Thyroidectomy, partial  1966  . Knee arthroscopy  06/1995    left Dr Kennith [place]  . Joint replacement  04/30/2001    bilateral TKR  . Cholecystectomy  10/2009  . Eye muscle surgery      12/12 had double vision Dr. Earlene Plater at [facility]   Social History:  reports that he has never smoked. He does not have any smokeless tobacco history on file.  He reports that he does not drink alcohol or use illicit drugs.  Retired Curator.  Lives with wife.  He uses a cane occasionally.  Stairs to get into the house.  No home services recently.    Allergies  Allergen Reactions  . Sulfonamide Derivatives Other (See Comments)    Childhood allergy    Family History  Problem Relation Age of Onset  . Hypertension Mother   . Stroke Mother   . Alcohol abuse Father   . Cancer Brother     prostate CA  . Heart disease Brother      Prior to Admission medications   Medication Sig Start Date End Date Taking?  Authorizing Provider  aspirin EC 81 MG tablet Take 81 mg by mouth daily.     Yes Historical Provider, MD  B Complex-C (SUPER B COMPLEX) TABS Take 1 tablet by mouth daily.   Yes Historical Provider, MD  levothyroxine (SYNTHROID, LEVOTHROID) 100 MCG tablet Take 100 mcg by mouth daily before breakfast.   Yes Historical Provider, MD  Multiple Vitamin (MULTIVITAMIN) tablet Take 1 tablet by mouth daily.     Yes Historical Provider, MD  omeprazole (PRILOSEC) 20 MG capsule Take 20 mg by mouth every other day.    Yes Historical Provider, MD  terazosin (HYTRIN) 5 MG capsule TAKE 1 CAPSULE DAILY 06/06/12  Yes Joaquim Nam, MD   Physical Exam: Filed Vitals:   07/11/12 1318 07/11/12 1512 07/11/12 1552  BP: 139/102  127/103  Pulse: 111  85  Temp: 98 F (36.7 C) 97.6 F (36.4 C)   TempSrc: Oral    Resp: 18  15  SpO2: 97%  96%     General:  CM, no acute distress, lying on stretcher  Eyes:  PERRL, anicteric, non-injected.    ENT:  Nares clear.  OP clear, non-erythematous without plaques or exudates.  MMM.  Neck:  Supple without TM or JVD.    Lymph:  No cervical, supraclavicular, or submandibular LAD.  Cardiovascular:  IRRR, normal S1, S2, without m/r/g.  2+ pulses, warm extremities, intermittent possible atrial fibrillation on telemetry, although baseline is not clear.  Respiratory:  CTA bilaterally without increased WOB.  Abdomen:  NABS.  Soft, ND/NT.    Skin:  No rashes or focal lesions.  Musculoskeletal:  Normal bulk and tone.  No LE edema.  Psychiatric:  A & O x 4.  Jokes frequently.    Neurologic:  CN 3-12 intact, except difficulty looking superiorly and difficulty hearing.  5/5 strength on right, 5-/5 on the left despite being left handed and some dysmetria bilaterally L>R upper and lower extremities.  Sensation decreased on feet.  Labs on Admission:  Basic Metabolic Panel:  Recent Labs Lab 07/11/12 1357 07/11/12 1411  NA 140 143  K 4.1 4.1  CL 105 105  CO2 28  --    GLUCOSE 91 96  BUN 18 17  CREATININE 1.16 1.30  CALCIUM 9.2  --    Liver Function Tests:  Recent Labs Lab 07/11/12 1357  AST 19  ALT 9  ALKPHOS 71  BILITOT 0.7  PROT 6.9  ALBUMIN 3.9   No results found for this basename: LIPASE, AMYLASE,  in the last 168 hours No results found for this basename: AMMONIA,  in the last 168 hours CBC:  Recent Labs Lab 07/11/12 1357 07/11/12 1411  WBC 7.1  --   NEUTROABS 5.2  --   HGB 14.8 15.6  HCT 42.7 46.0  MCV 82.1  --  PLT 209  --    Cardiac Enzymes:  Recent Labs Lab 07/11/12 1353  TROPONINI <0.30    BNP (last 3 results) No results found for this basename: PROBNP,  in the last 8760 hours CBG:  Recent Labs Lab 07/11/12 1355  GLUCAP 90    Radiological Exams on Admission: Ct Head Wo Contrast  07/11/2012  *RADIOLOGY REPORT*  Clinical Data: 79 year old male weakness and off balance.  CT HEAD WITHOUT CONTRAST  Technique:  Contiguous axial images were obtained from the base of the skull through the vertex without contrast.  Comparison: None.  Findings: Chronic maxillary sinusitis with mucoperiosteal thickening, sub total opacification of both sinuses, and some associated bubbly opacity.  Polypoid mucosal thickening or retained secretions in the nasal cavity.  Left ethmoid opacification.  Mild left sphenoid bubbly opacity and opacification.  Frontal sinuses, mastoids and tympanic cavities are clear.  Cerebral volume loss.  Ventriculomegaly probably is ex vacuo related; the temporal horns remain relatively small. No midline shift, mass effect, or evidence of mass lesion.  Moderate heterogeneity of the deep gray matter nuclei compatible with previous lacunar infarcts.  Patchy cerebral white matter hypodensity. No acute intracranial hemorrhage identified.  No evidence of cortically based acute infarction identified.  Intracranial artery dolichoectasia.  Asymmetric enlargement and irregular contour of the distal cervical left ICA (series 2  image 3 and series 3 image 1) has an appearance most suggestive of partially calcified pseudoaneurysm.  IMPRESSION: 1. No acute intracranial abnormality. 2.  Suspect advanced chronic small vessel ischemia.  Cerebral volume loss with ventriculomegaly which is probably ex vacuo in nature. 3.  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   Original Report Authenticated By: Erskine Speed, M.D.     EKG:  Pending. Telemetry demonstrates NSR intermixed with an irregularly irregular rhythm that alerts at atrial fibrillation but which Tischer reflect sinus arrythmia, but as baseline is not great, difficult to tell.    Assessment/Plan Active Problems:   HYPOTHYROIDISM   Pure hypercholesterolemia   TREMOR, ESSENTIAL, LEFT HAND   HYPERTENSION   TIA (transient ischemic attack)   Blurry vision   Balance problem   Intracranial atherosclerosis   Ventricular enlargement due to brain atrophy   Abnormal heart rhythm  Blurry vision and balance problems:  DDx includes TIA or stroke with persistence of symptoms suggestive of stroke, a primary eye problem with consequent balance problems, or an abnormal presentation of vertigo.  History less suggestive of orthostasis.  -  Admit to telemetry -  Cycle troponins -  MRI brain/MRA head -  Carotid duplex -  ECHO with bubble -  Aspirin 325mg  daily -  Lipid panel -  Hemoglobin A1c -  PT/OT/SLP  -  Appreciate neurology assistance  Possible atrial fibrillation on telemetry -  Continue telemetry -  Must transfer ON telemetry -  Cycle troponins -  ECHO  -  TSH  HTN:  Blood pressure currently well controlled -  Continue current threapy  Hypothyroidism,  -  Check TSH -  Continue synthroid  BPH, stable, continue terazosin  Renal mass, followed as outpatient.    Diet:  Healthy heart Access:  PIV IVF:  NS at 57ml/h x 12 hours Proph:  lovenox  Code Status: DNR Family  Communication: spoke with patient and his wife who was at bedside Disposition Plan: admit to telemetry  Time spent: 60 min  Renae Fickle [facility] Pager [phone]  If 7PM-7AM, please  contact night-coverage [email or web address] Password [facility] 07/11/2012, 4:49 PM

## 2012-07-11 NOTE — ED Notes (Signed)
Pt to ED via EMS from home with c/o weakness and off balance noticed this morning at 1130. Family states noticed that pt was off balance at 600 today while walking to bathroom. Last seen normal at midnight yesterday. Pt c/o delayed vision.

## 2012-07-11 NOTE — ED Provider Notes (Signed)
History     CSN: [id number]  Arrival date & time 07/11/12  1318   First MD Initiated Contact with Patient 07/11/12 1329      Chief Complaint  Patient presents with  . Weakness    (Consider location/radiation/quality/duration/timing/severity/associated sxs/prior treatment) HPI Comments: 79 year old male with a history of hypertension, possible renal mass, hypothyroidism who presents with a complaint of inability to walk well. The patient states that he woke up at 11:00 AM this morning and when he tried to get a bed to walk he felt like he was off balance and had to hold onto things to walk. He normally does not require any assistance when he walks and is able to walk back and forth from his house to his workshop which is 150 feet. He denies any dizziness or vertigo, nausea or vomiting, chest pain or shortness of breath. When the paramedics arrived at his home he was having difficulty ambulating to the stretcher without 2 person assistance. He also comments that he is having difficulty with vision but is vague in this complaint. The symptoms are persistent, nothing seems to make it better or worse, onset is unknown as he awoke with the symptoms  Patient is a 79 y.o. male presenting with weakness. The history is provided by the patient and the EMS personnel.  Weakness    Past Medical History  Diagnosis Date  . Hypertension   . Hypothyroidism   . Diverticulosis of colon   . BPH (benign prostatic hypertrophy)   . Pancreatitis 2011    and gallstone pancreatitis and Ecoli bacteremia  . Renal mass 2011    Eval by Dr [name] echogenic mass on u/s. followed by Darlen Round- observation as of 09/2010  . Rabbit fever 1940    Past Surgical History  Procedure Laterality Date  . Appendectomy  1959  . Thyroidectomy, partial  1966  . Knee arthroscopy  06/1995    left Dr Kennith Center  . Joint replacement  04/30/2001    bilateral TKR  . Cholecystectomy  10/2009  . Eye muscle surgery      12/12     Family History  Problem Relation Age of Onset  . Hypertension Mother   . Stroke Mother   . Alcohol abuse Father   . Cancer Brother     prostate CA  . Heart disease Brother     History  Substance Use Topics  . Smoking status: Never Smoker   . Smokeless tobacco: Not on file  . Alcohol Use: Yes     Comment: minimal, makes wine      Review of Systems  Neurological: Positive for weakness.  All other systems reviewed and are negative.    Allergies  Sulfonamide derivatives  Home Medications   Current Outpatient Rx  Name  Route  Sig  Dispense  Refill  . aspirin EC 81 MG tablet   Oral   Take 81 mg by mouth daily.           . B Complex-C (SUPER B COMPLEX) TABS   Oral   Take 1 tablet by mouth daily.         Marland Kitchen levothyroxine (SYNTHROID, LEVOTHROID) 100 MCG tablet   Oral   Take 100 mcg by mouth daily before breakfast.         . Multiple Vitamin (MULTIVITAMIN) tablet   Oral   Take 1 tablet by mouth daily.           Marland Kitchen omeprazole (PRILOSEC) 20 MG capsule  Oral   Take 20 mg by mouth every other day.          . terazosin (HYTRIN) 5 MG capsule      TAKE 1 CAPSULE DAILY   90 capsule   3     BP 139/102  Pulse 111  Temp(Src) 97.6 F (36.4 C) (Oral)  Resp 18  SpO2 97%  Physical Exam  Nursing note and vitals reviewed. Constitutional: He appears well-developed and well-nourished. No distress.  HENT:  Head: Normocephalic and atraumatic.  Mouth/Throat: Oropharynx is clear and moist. No oropharyngeal exudate.  Eyes: Conjunctivae and EOM are normal. Pupils are equal, round, and reactive to light. Right eye exhibits no discharge. Left eye exhibits no discharge. No scleral icterus.  Neck: Normal range of motion. Neck supple. No JVD present. No thyromegaly present.  Cardiovascular: Normal rate, regular rhythm, normal heart sounds and intact distal pulses.  Exam reveals no gallop and no friction rub.   No murmur heard. Pulmonary/Chest: Effort normal and  breath sounds normal. No respiratory distress. He has no wheezes. He has no rales.  Abdominal: Soft. Bowel sounds are normal. He exhibits no distension and no mass. There is no tenderness.  Musculoskeletal: Normal range of motion. He exhibits no edema and no tenderness.  Lymphadenopathy:    He has no cervical adenopathy.  Neurological: He is alert. Coordination normal.  Speech is clear and oriented, goal-directed, strength in all 4 extremities is normal at 5 out of 5, patient has normal sensation to light touch and pinprick, normal position sense, cranial nerves III through XII are intact, peripheral visual fields are intact, reflexes are maintained at the knees and the brachial radialis bilaterally, heel shin slightly shaky bilaterally, finger-nose-finger normal on the right, some discoordination on the left. The patient is unable to ambulate without significant assistance as he stumbled falling to the right. He is unable to walk a straight line  Skin: Skin is warm and dry. No rash noted. No erythema.  Psychiatric: He has a normal mood and affect. His behavior is normal.    ED Course  Procedures (including critical care time)  Labs Reviewed  PROTIME-INR  APTT  CBC  DIFFERENTIAL  TROPONIN I  URINE RAPID DRUG SCREEN (HOSP PERFORMED)  URINALYSIS, ROUTINE W REFLEX MICROSCOPIC  GLUCOSE, CAPILLARY  ETHANOL  COMPREHENSIVE METABOLIC PANEL  POCT I-STAT, CHEM 8  POCT I-STAT TROPONIN I   Ct Head Wo Contrast  07/11/2012  *RADIOLOGY REPORT*  Clinical Data: 79 year old male weakness and off balance.  CT HEAD WITHOUT CONTRAST  Technique:  Contiguous axial images were obtained from the base of the skull through the vertex without contrast.  Comparison: None.  Findings: Chronic maxillary sinusitis with mucoperiosteal thickening, sub total opacification of both sinuses, and some associated bubbly opacity.  Polypoid mucosal thickening or retained secretions in the nasal cavity.  Left ethmoid opacification.   Mild left sphenoid bubbly opacity and opacification.  Frontal sinuses, mastoids and tympanic cavities are clear.  Cerebral volume loss.  Ventriculomegaly probably is ex vacuo related; the temporal horns remain relatively small. No midline shift, mass effect, or evidence of mass lesion.  Moderate heterogeneity of the deep gray matter nuclei compatible with previous lacunar infarcts.  Patchy cerebral white matter hypodensity. No acute intracranial hemorrhage identified.  No evidence of cortically based acute infarction identified.  Intracranial artery dolichoectasia.  Asymmetric enlargement and irregular contour of the distal cervical left ICA (series 2 image 3 and series 3 image 1) has an appearance most suggestive of  partially calcified pseudoaneurysm.  IMPRESSION: 1. No acute intracranial abnormality. 2.  Suspect advanced chronic small vessel ischemia.  Cerebral volume loss with ventriculomegaly which is probably ex vacuo in nature. 3.  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   Original Report Authenticated By: Erskine Speed, M.D.      1. Stroke   2. A-fib       MDM  There are no carotid bruits, otherwise the patient's exam is unremarkable except for his neurologic findings. He lower choir a stroke workup, concern for posterior circulation.  ED ECG REPORT  I personally interpreted this EKG   Date: 07/11/2012   Rate: 88  Rhythm: atrial fibrillation  QRS Axis: left  Intervals: normal  ST/T Wave abnormalities: normal  Conduction Disutrbances:none  Narrative Interpretation:   Old EKG Reviewed: Compared with 10/15/2009, atrial fibrillation with PVCs present in place at normal sinus rhythm  I discussed the patient's care with the neurologist on call Dr. Thad Ranger as well as with the hospitalist Dr. Malachi Bonds, we are in agreement that the patient is to be admitted for further ischemic workup. The patient is  noted to be in new onset atrial fibrillation though the patient is unsure that he had this problem. Aspirin 7 given, the patient will be admitted to a telemetry bed. CT scan does not show acute findings including no hemorrhage.         Vida Roller, MD 07/11/12 [phone]

## 2012-07-11 NOTE — Consult Note (Signed)
Referring Physician: Hyacinth Meeker    Chief Complaint: acute onset of dysequilibrium  HPI:                                                                                                                                         [name] is an 79 y.o. male who was last seen normal at 12 AM prior to going to bed. Patient slept in to 11 AM.  He states he quickly got out of bed to go to the bathroom, took a few steps, enough to turn the corner into another room then felt a sudden onset of "feeling off balance" and then some blurred vision. He made his way back the bed and laid down. He was brought to the ED due to the sudden onset of dysequilibrium, and sensation of transient blurred vision.  Patient admits to have decreased memory over the past years (especially the short term memory such remembering his tools or going to a restaurant and recalling restaurants they usually go go). Neurology was consulted for possible CVA.   Date last known well: 4.8.14 Time last known well: 1159 tPA Given: No: out of window  Past Medical History  Diagnosis Date  . Hypertension   . Hypothyroidism   . Diverticulosis of colon   . BPH (benign prostatic hypertrophy)   . Pancreatitis 2011    and gallstone pancreatitis and Ecoli bacteremia  . Renal mass 2011    Eval by Dr [name] echogenic mass on u/s. followed by Darlen Round- observation as of 09/2010  . Rabbit fever 1940    Past Surgical History  Procedure Laterality Date  . Appendectomy  1959  . Thyroidectomy, partial  1966  . Knee arthroscopy  06/1995    left Dr Kennith [place]  . Joint replacement  04/30/2001    bilateral TKR  . Cholecystectomy  10/2009  . Eye muscle surgery      12/12    Family history Father: Dec. 66 ETOH  Mother: Dec. 57 HTN, stroke  Brothers A 21 Brain probs...has drain leg problems, prostate CA, PVD - stent  Brother A 70 Heart Probs  Brother A 41  Brother A 60  Sister A 64 foot surgery Back probs   Social History:  reports that he has  never smoked. He does not have any smokeless tobacco history on file. He reports that  drinks alcohol. He reports that he does not use illicit drugs.  Allergies:  Allergies  Allergen Reactions  . Sulfonamide Derivatives Other (See Comments)    Childhood allergy    Medications:  No current facility-administered medications for this encounter.   Current Outpatient Prescriptions  Medication Sig Dispense Refill  . aspirin EC 81 MG tablet Take 81 mg by mouth daily.        . B Complex-C (SUPER B COMPLEX) TABS Take 1 tablet by mouth daily.      Marland Kitchen levothyroxine (SYNTHROID, LEVOTHROID) 100 MCG tablet Take 100 mcg by mouth daily before breakfast.      . Multiple Vitamin (MULTIVITAMIN) tablet Take 1 tablet by mouth daily.        Marland Kitchen omeprazole (PRILOSEC) 20 MG capsule Take 20 mg by mouth every other day.       . terazosin (HYTRIN) 5 MG capsule TAKE 1 CAPSULE DAILY  90 capsule  3     ROS:                                                                                                                                       History obtained from the patient  General ROS: negative for - chills, fatigue, fever, night sweats, weight gain or weight loss Psychological ROS: negative for - behavioral disorder, hallucinations, memory difficulties, mood swings or suicidal ideation Ophthalmic ROS: negative for - blurry vision, double vision, eye pain or loss of vision ENT ROS: negative for - epistaxis, nasal discharge, oral lesions, sore throat, tinnitus or vertigo Allergy and Immunology ROS: negative for - hives or itchy/watery eyes Hematological and Lymphatic ROS: negative for - bleeding problems, bruising or swollen lymph nodes Endocrine ROS: negative for - galactorrhea, hair pattern changes, polydipsia/polyuria or temperature intolerance Respiratory ROS: negative for - cough, hemoptysis,  shortness of breath or wheezing Cardiovascular ROS: negative for - chest pain, dyspnea on exertion, edema or irregular heartbeat Gastrointestinal ROS: negative for - abdominal pain, diarrhea, hematemesis, nausea/vomiting or stool incontinence Genito-Urinary ROS: negative for - dysuria, hematuria, incontinence or urinary frequency/urgency Musculoskeletal ROS: negative for - joint swelling or muscular weakness Neurological ROS: as noted in HPI Dermatological ROS: negative for rash and skin lesion changes  Neurologic Examination:                                                                                                      Blood pressure 139/102, pulse 111, temperature 98 F (36.7 C), temperature source Oral, resp. rate 18, SpO2 97.00%.  Mental Status: Alert, oriented, thought content appropriate.  Speech fluent without evidence of aphasia.  Able to follow 3 step commands without difficulty. Cranial Nerves: II: Discs flat bilaterally; Visual  fields show restriction in peripheral vision bilaterally, pupils equal, round, reactive to light and accommodation III,IV, VI: ptosis present left eye, extra-ocular motions intact bilaterally V,VII: smile symmetric, facial light touch sensation normal bilaterally VIII: hearing decreased IX,X: gag reflex present XI: bilateral shoulder shrug XII: midline tongue extension Motor: Right : Upper extremity   5/5    Left:     Upper extremity   5/5  Lower extremity   5/5     Lower extremity   5/5 Tone and bulk:normal tone throughout; no atrophy noted Sensory: Intact with decreased sensation to light touch, vibration and proprioception from toes to just below knee.  Deep Tendon Reflexes: 2+ and symmetric throughout UE and bilateral KJ but no AJ Plantars: Right: downgoing   Left: downgoing Cerebellar: normal finger-to-nose,  normal heel-to-shin test Gait: unsteady, wide based, negative romberg.  CV: pulses palpable throughout    Results for orders  placed during the hospital encounter of 07/11/12 (from the past 48 hour(s))  GLUCOSE, CAPILLARY     Status: None   Collection Time    07/11/12  1:55 PM      Result Value Range   Glucose-Capillary 90  70 - 99 mg/dL  POCT I-STAT TROPONIN I     Status: None   Collection Time    07/11/12  2:10 PM      Result Value Range   Troponin i, poc 0.00  0.00 - 0.08 ng/mL   Comment 3            Comment: Due to the release kinetics of cTnI,     a negative result within the first hours     of the onset of symptoms does not rule out     myocardial infarction with certainty.     If myocardial infarction is still suspected,     repeat the test at appropriate intervals.  POCT I-STAT, CHEM 8     Status: None   Collection Time    07/11/12  2:11 PM      Result Value Range   Sodium 143  135 - 145 mEq/L   Potassium 4.1  3.5 - 5.1 mEq/L   Chloride 105  96 - 112 mEq/L   BUN 17  6 - 23 mg/dL   Creatinine, Ser 9.60  0.50 - 1.35 mg/dL   Glucose, Bld 96  70 - 99 mg/dL   Calcium, Ion 4.54  0.98 - 1.30 mmol/L   TCO2 27  0 - 100 mmol/L   Hemoglobin 15.6  13.0 - 17.0 g/dL   HCT 11.9  14.7 - 82.9 %   Ct Head Wo Contrast  07/11/2012  *RADIOLOGY REPORT*  Clinical Data: 79 year old male weakness and off balance.  CT HEAD WITHOUT CONTRAST  Technique:  Contiguous axial images were obtained from the base of the skull through the vertex without contrast.  Comparison: None.  Findings: Chronic maxillary sinusitis with mucoperiosteal thickening, sub total opacification of both sinuses, and some associated bubbly opacity.  Polypoid mucosal thickening or retained secretions in the nasal cavity.  Left ethmoid opacification.  Mild left sphenoid bubbly opacity and opacification.  Frontal sinuses, mastoids and tympanic cavities are clear.  Cerebral volume loss.  Ventriculomegaly probably is ex vacuo related; the temporal horns remain relatively small. No midline shift, mass effect, or evidence of mass lesion.  Moderate heterogeneity of  the deep gray matter nuclei compatible with previous lacunar infarcts.  Patchy cerebral white matter hypodensity. No acute intracranial hemorrhage identified.  No evidence of  cortically based acute infarction identified.  Intracranial artery dolichoectasia.  Asymmetric enlargement and irregular contour of the distal cervical left ICA (series 2 image 3 and series 3 image 1) has an appearance most suggestive of partially calcified pseudoaneurysm.  IMPRESSION: 1. No acute intracranial abnormality. 2.  Suspect advanced chronic small vessel ischemia.  Cerebral volume loss with ventriculomegaly which is probably ex vacuo in nature. 3.  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   Original Report Authenticated By: Erskine Speed, M.D.     Assessment and plan discussed with with attending physician and they are in agreement.    Felicie Morn PA-C [facility] [phone]  07/11/2012, 2:33 PM  Patient seen and examined.  Clinical course and management discussed.  Necessary edits performed.  I agree with the above.  Assessment and plan of care developed and discussed below.    Assessment: 79 y.o. male presenting with visual complaints and gait instability.  He his a poor historian and history has taken on multiple different characteristics since presentation.  Would like to rule out possibility of an acute infarct.  Patient clearly has other contributing factors in exam such as peripheral neuropathy but these should not have led to an acute change.    Stroke Risk Factors - hypertension  Plan: 1. HgbA1c, fasting lipid panel 2. MRI, MRA  of the brain without contrast.  If an acute infarct noted would proceed with a stroke work up at that time.   3. If no acute infarct would have ophthalmology see patient as well. 4. TSH, B12, ESR, RPR, folate 5.  Continue ASA 6.  Telemetry monitoring  7.  Frequent neuro  checks  Thana Farr, MD [facility] [phone]  07/11/2012  4:10 PM

## 2012-07-12 DIAGNOSIS — K2270 Barrett's esophagus without dysplasia: Secondary | ICD-10-CM

## 2012-07-12 LAB — LIPID PANEL
HDL: 46 mg/dL (ref >39)
LDL Cholesterol: 54 mg/dL (ref 0–99)

## 2012-07-12 LAB — HEMOGLOBIN A1C: Hgb A1c MFr Bld: 5.5 % (ref <5.7)

## 2012-07-12 LAB — TROPONIN I: Troponin I: <0.3 ng/mL (ref <0.30)

## 2012-07-12 MED ORDER — TROPICAMIDE 1 % OP SOLN
1.0000 [drp] | Freq: Once | OPHTHALMIC | Status: AC
  Administered 2012-07-12: 1 [drp] via OPHTHALMIC
  Filled 2012-07-12: qty 2

## 2012-07-12 NOTE — Consult Note (Signed)
  79 yo male who woke up 2 days ago and had tunnel vision in both eyes.  He had woken up to go to the bathroom, but found that he was having trouble walking because he could not see except the very central portion of the vision. Since he has been in the hospital the vision seems to be gradually expanding and becoming more normal.  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  He has a history of diplopia recently for which he had extraocular muscle surgery some time in the past year at [facility].  He no longer has diplopia.  ZO:XWRU only with correction: OD: 20/30 OS:20/30 Pupils:  4mm OU equal round and reactive to light No APD EOMs: full OU Confrontation Visual Fields: I would estimate that his tunnel vision extends to about 20 degrees in each eye. Applanation IOPs: OD:11  OS:12  Slit Lamp Exam: Lids/Lashes: Normal OU Conj: Normal OU Cornea:  Clear OU  ?few old FB scars OU AC: Deep and Quiet OU Iris: Normal OU Lens:2+NS and 2+Cortical Cataracts OU  Dilated Fundus Exam: C/D ratio: OD:0.3 OS:0.4 Macula:Drusen and mild RPE changes OU, OD>OS, No macular hemorrhage or edema OU Vessels: Mild arteriosclerotic changes OU, Moderate AV Crossing Changes OU Periphery:Scattered Reticular Degeneration, No holes or tears 360 OU Vitreous: Large PVDs OU   Assess: Occipital lobe ?TIA/Stroke with sparing of the tips(macular area) of the occipital lobe Patient seems to be recovering his peripheral vision slowly. Few Old FB Corneal Scars OU Cataracts OU Moderate ARMD Mild OU, OS>OD, No macular edema or macular hemorrhage OU  Plan:Patient should have formal VF exam when he is out of the hospital and up and around to assess any permanent loss of vision. He can contact his eye doctor or make an appt with me. Recommend Anti-Oxidant Vitamins, most OTC formulas have the most important antioxidants that have been studied and shown to be effective in slowing down macular  degeneration:Vitamins A,C,E,Beta-carotene,Zinc,Bilberry,Lutein,Zanthein-all part of the AREDS study. This was discussed with the patient but he said that he might not remember, so I told him to get a Multi-Vitamin Formula  Trish Fountain MD  C:[phone]  [phone]

## 2012-07-12 NOTE — Evaluation (Signed)
SLP reviewed and agree with student findings.   Brenlynn Fake MA, CCC-SLP [phone]    

## 2012-07-12 NOTE — Progress Notes (Signed)
VASCULAR LAB PRELIMINARY  PRELIMINARY  PRELIMINARY  PRELIMINARY  Carotid Dopplers completed.    Preliminary report:  There is no ICA stenosis.  Vertebral artery flow is antegrade.  Latise Dilley, RVT 07/12/2012, 9:00 AM

## 2012-07-12 NOTE — Progress Notes (Signed)
Utilization review completed. Rubbie Goostree, RN, BSN. 

## 2012-07-12 NOTE — Progress Notes (Signed)
*  PRELIMINARY RESULTS* Echocardiogram 2D Echocardiogram has been performed.  Jeryl Columbia 07/12/2012, 9:37 AM

## 2012-07-12 NOTE — Progress Notes (Addendum)
Patient ID: [name]  male  OZH:[id number]    DOB: 1931-02-24    DOA: 07/11/2012  PCP: [name], MD  Assessment/Plan: Principal Problem:  Blurry peripheral vision with balance problems: Primary opthal problem vs TIA (transient ischemic attack) - MRI of the brain showed no acute or reversible finding, at roughly and extensive chronic small vessel disease throughout the brain - MRA shows no major vessel occlusion or correctable proximal stenosis - Carotid Dopplers showed no ICA stenosis - 2-D echo pending - Currently on aspirin 325 mg daily - LDL of 54, cholesterol 113, hemoglobin A1c 5.5 - TSH slightly low, RPR nonreactive, B12 and folate in normal range - Discussed with neurology service who recommended ophthalmology evaluation. Patient appears to have strabismus surgery done at [facility], does not remember his ophthalmologist name in [place]. Requested on-call ophthalmology evaluation with Dr Gwen Pounds, will evaluate patient. - Continue PT OT evaluation  Active Problems:   HYPOTHYROIDISM: - Continue Synthroid  GERD: Continue PPI    Abnormal heart rhythm ? Atrial fibrillation on telemetry in ED - Also checked with central telemetry, patient had no episodes of atrial fibrillation on telemetry, at 2:06am, the alarm on tele registered multiple PVC's but no A. fib. - currently in NSR, PVC's on tele, no documented Afib - 3 sets of cardiac enzymes negative     DVT Prophylaxis: Lovenox  Code Status:  Disposition: Hopefully in a.m.    Subjective: Patient's feels somewhat better today, feels going back to his baseline, wife at bedside  Objective: Weight change:  No intake or output data in the 24 hours ending 07/12/12 1342 Blood pressure 134/78, pulse 71, temperature 98.2 F (36.8 C), temperature source Oral, resp. rate 18, height 5\' 10"  (1.778 m), weight 74.8 kg (164 lb 14.5 oz), SpO2 98.00%.  Physical Exam: General: Alert and awake, oriented x3, not in any acute  distress. CVS: S1-S2 clear, no murmur rubs or gallops Chest: clear to auscultation bilaterally, no wheezing, rales or rhonchi Abdomen: soft nontender, nondistended, normal bowel sounds, no organomegaly Extremities: no cyanosis, clubbing or edema noted bilaterally Neuro:  5/5 strength in upper and lower extremities  Lab Results: Basic Metabolic Panel:  Recent Labs Lab 07/11/12 1357 07/11/12 1411 07/11/12 1707  NA 140 143  --   K 4.1 4.1  --   CL 105 105  --   CO2 28  --   --   GLUCOSE 91 96  --   BUN 18 17  --   CREATININE 1.16 1.30 1.19  CALCIUM 9.2  --   --    Liver Function Tests:  Recent Labs Lab 07/11/12 1357  AST 19  ALT 9  ALKPHOS 71  BILITOT 0.7  PROT 6.9  ALBUMIN 3.9   No results found for this basename: LIPASE, AMYLASE,  in the last 168 hours No results found for this basename: AMMONIA,  in the last 168 hours CBC:  Recent Labs Lab 07/11/12 1357 07/11/12 1411 07/11/12 1707  WBC 7.1  --  7.0  NEUTROABS 5.2  --   --   HGB 14.8 15.6 14.4  HCT 42.7 46.0 42.0  MCV 82.1  --  83.2  PLT 209  --  201   Cardiac Enzymes:  Recent Labs Lab 07/11/12 1707 07/11/12 2301 07/12/12 0625  TROPONINI <0.30 <0.30 <0.30   BNP: No components found with this basename: POCBNP,  CBG:  Recent Labs Lab 07/11/12 1355  GLUCAP 90     Micro Results: No results found for this  or any previous visit (from the past 240 hour(s)).  Studies/Results: Dg Chest 2 View  07/11/2012  *RADIOLOGY REPORT*  Clinical Data: 79 year old male with stroke.  CHEST - 2 VIEW  Comparison: Portable chest radiograph 10/16/2009.  Findings: Improved lung volumes.  Mild cardiomegaly. Other mediastinal contours are within normal limits.  No pneumothorax or pulmonary edema.  No definite pleural effusion or confluent pulmonary opacity. No acute osseous abnormality identified.  IMPRESSION: No acute cardiopulmonary abnormality.   Original Report Authenticated By: Erskine Speed, M.D.    Ct Head Wo  Contrast  07/11/2012  *RADIOLOGY REPORT*  Clinical Data: 79 year old male weakness and off balance.  CT HEAD WITHOUT CONTRAST  Technique:  Contiguous axial images were obtained from the base of the skull through the vertex without contrast.  Comparison: None.  Findings: Chronic maxillary sinusitis with mucoperiosteal thickening, sub total opacification of both sinuses, and some associated bubbly opacity.  Polypoid mucosal thickening or retained secretions in the nasal cavity.  Left ethmoid opacification.  Mild left sphenoid bubbly opacity and opacification.  Frontal sinuses, mastoids and tympanic cavities are clear.  Cerebral volume loss.  Ventriculomegaly probably is ex vacuo related; the temporal horns remain relatively small. No midline shift, mass effect, or evidence of mass lesion.  Moderate heterogeneity of the deep gray matter nuclei compatible with previous lacunar infarcts.  Patchy cerebral white matter hypodensity. No acute intracranial hemorrhage identified.  No evidence of cortically based acute infarction identified.  Intracranial artery dolichoectasia.  Asymmetric enlargement and irregular contour of the distal cervical left ICA (series 2 image 3 and series 3 image 1) has an appearance most suggestive of partially calcified pseudoaneurysm.  IMPRESSION: 1. No acute intracranial abnormality. 2.  Suspect advanced chronic small vessel ischemia.  Cerebral volume loss with ventriculomegaly which is probably ex vacuo in nature. 3.  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   Original Report Authenticated By: Erskine Speed, M.D.    Mr Brain Wo Contrast  07/11/2012  *RADIOLOGY REPORT*  Clinical Data:  Blurred vision.  Balanced disturbance.  Abnormal head CT.  MRI HEAD WITHOUT CONTRAST MRA HEAD WITHOUT CONTRAST  Technique:  Multiplanar, multiecho pulse sequences of the brain and surrounding structures were obtained  without intravenous contrast. Angiographic images of the head were obtained using MRA technique without contrast.  Comparison:  Head CT same day  MRI HEAD  Findings:  Diffusion imaging does not show any acute or subacute infarction.  There are chronic small vessel changes affecting the pons.  There is generalized cerebellar atrophy.  The cerebral hemispheres show generalized atrophy with confluent chronic small vessel changes throughout the hemispheric white matter.  No cortical or large vessel territory infarction.  No evidence of mass lesion, acute hemorrhage, hydrocephalus or extra-axial collection. Foci of hemosiderin deposition are scattered about several of the old small vessel infarctions.  The ventricles are prominent, but this is consistent with central atrophy.  No pituitary mass.  There are inflammatory changes of the maxillary and ethmoid sinuses with hypoplastic maxillary sinuses.  IMPRESSION: No acute or reversible findings.  Atrophy and extensive chronic small vessel disease throughout the brain.  MRA HEAD  Findings: Both internal carotid arteries are patent into the brain. The anterior and middle cerebral vessels are patent without correctable proximal stenosis, aneurysm or vascular malformation. More distal branch vessels show diffuse atherosclerotic irregularity.  Both vertebral arteries are patent to the basilar with the left being dominant.  No basilar stenosis.  Posterior circulation branch vessels are patent but show atherosclerotic irregularity.  IMPRESSION: No major vessel occlusion or correctable proximal stenosis.  Distal vessel atherosclerotic irregularity diffusely.   Original Report Authenticated By: Paulina Fusi, M.D.    Mr Mra Head/brain Wo Cm  07/11/2012  *RADIOLOGY REPORT*  Clinical Data:  Blurred vision.  Balanced disturbance.  Abnormal head CT.  MRI HEAD WITHOUT CONTRAST MRA HEAD WITHOUT CONTRAST  Technique:  Multiplanar, multiecho pulse sequences of the brain and surrounding  structures were obtained without intravenous contrast. Angiographic images of the head were obtained using MRA technique without contrast.  Comparison:  Head CT same day  MRI HEAD  Findings:  Diffusion imaging does not show any acute or subacute infarction.  There are chronic small vessel changes affecting the pons.  There is generalized cerebellar atrophy.  The cerebral hemispheres show generalized atrophy with confluent chronic small vessel changes throughout the hemispheric white matter.  No cortical or large vessel territory infarction.  No evidence of mass lesion, acute hemorrhage, hydrocephalus or extra-axial collection. Foci of hemosiderin deposition are scattered about several of the old small vessel infarctions.  The ventricles are prominent, but this is consistent with central atrophy.  No pituitary mass.  There are inflammatory changes of the maxillary and ethmoid sinuses with hypoplastic maxillary sinuses.  IMPRESSION: No acute or reversible findings.  Atrophy and extensive chronic small vessel disease throughout the brain.  MRA HEAD  Findings: Both internal carotid arteries are patent into the brain. The anterior and middle cerebral vessels are patent without correctable proximal stenosis, aneurysm or vascular malformation. More distal branch vessels show diffuse atherosclerotic irregularity.  Both vertebral arteries are patent to the basilar with the left being dominant.  No basilar stenosis.  Posterior circulation branch vessels are patent but show atherosclerotic irregularity.  IMPRESSION: No major vessel occlusion or correctable proximal stenosis.  Distal vessel atherosclerotic irregularity diffusely.   Original Report Authenticated By: Paulina Fusi, M.D.     Medications: Scheduled Meds: . aspirin EC  325 mg Oral Daily  . B-complex with vitamin C  1 tablet Oral Daily  . enoxaparin (LOVENOX) injection  40 mg Subcutaneous Q24H  . levothyroxine  100 mcg Oral QAC breakfast  . multivitamin with  minerals  1 tablet Oral Daily  . pantoprazole  40 mg Oral Daily  . terazosin  5 mg Oral QHS      LOS: 1 day   RAI,RIPUDEEP M.D. [facility] 07/12/2012, 1:42 PM Pager: [phone]  If 7PM-7AM, please contact night-coverage [email or web address] Password [email or web address]

## 2012-07-12 NOTE — Progress Notes (Addendum)
NEURO HOSPITALIST PROGRESS NOTE   SUBJECTIVE:                                                                                                                        Patient has had no further episodes. He is feeling back to his baseline and wife agrees.   OBJECTIVE:                                                                                                                           Vital signs in last 24 hours: Temp:  [97.4 F (36.3 C)-98.2 F (36.8 C)] 98.2 F (36.8 C) (04/10 1034) Pulse Rate:  [56-111] 71 (04/10 1034) Resp:  [15-20] 18 (04/10 1034) BP: (108-172)/(67-107) 134/78 mmHg (04/10 1034) SpO2:  [96 %-98 %] 98 % (04/10 1034) Weight:  [74.8 kg (164 lb 14.5 oz)] 74.8 kg (164 lb 14.5 oz) (04/09 1700)  Intake/Output from previous day:   Intake/Output this shift:   Nutritional status: Cardiac  Past Medical History  Diagnosis Date  . Hypertension   . Hypothyroidism   . Diverticulosis of colon   . BPH (benign prostatic hypertrophy)   . Pancreatitis 2011    and gallstone pancreatitis and Ecoli bacteremia  . Renal mass 2011    Eval by Dr [name] echogenic mass on u/s. followed by Darlen Round- observation as of 09/2010  . Rabbit fever 1940    Neurologic Exam:  Mental Status: Alert, oriented, thought content appropriate.  Speech fluent without evidence of aphasia.  Able to follow 3 step commands without difficulty. Cranial Nerves: II: Visual fields shows restricted peripheral vision, normal, pupils equal, round, reactive to light and accommodation III,IV, VI: ptosis not present, extra-ocular motions intact bilaterally V,VII: smile symmetric, facial light touch sensation normal bilaterally VIII: hearing normal bilaterally IX,X: gag reflex present XI: bilateral shoulder shrug XII: midline tongue extension Motor: Right : Upper extremity   5/5    Left:     Upper extremity   5/5  Lower extremity   5/5     Lower extremity   5/5 Tone and  bulk:normal tone throughout; no atrophy noted Sensory: Pinprick and light touch intact throughout, bilaterally Deep Tendon Reflexes: 2+ and symmetric throughout Plantars: Right: downgoing   Left:  downgoing Cerebellar: normal finger-to-nose,  normal heel-to-shin test CV: pulses palpable throughout    Lab Results: Lab Results  Component Value Date/Time   CHOL 113 07/12/2012  6:25 AM   Lipid Panel  Recent Labs  07/12/12 0625  CHOL 113  TRIG 67  HDL 46  CHOLHDL 2.5  VLDL 13  LDLCALC 54    Studies/Results: Dg Chest 2 View  07/11/2012  *RADIOLOGY REPORT*  Clinical Data: 79 year old male with stroke.  CHEST - 2 VIEW  Comparison: Portable chest radiograph 10/16/2009.  Findings: Improved lung volumes.  Mild cardiomegaly. Other mediastinal contours are within normal limits.  No pneumothorax or pulmonary edema.  No definite pleural effusion or confluent pulmonary opacity. No acute osseous abnormality identified.  IMPRESSION: No acute cardiopulmonary abnormality.   Original Report Authenticated By: Erskine Speed, M.D.    Ct Head Wo Contrast  07/11/2012  *RADIOLOGY REPORT*  Clinical Data: 79 year old male weakness and off balance.  CT HEAD WITHOUT CONTRAST  Technique:  Contiguous axial images were obtained from the base of the skull through the vertex without contrast.  Comparison: None.  Findings: Chronic maxillary sinusitis with mucoperiosteal thickening, sub total opacification of both sinuses, and some associated bubbly opacity.  Polypoid mucosal thickening or retained secretions in the nasal cavity.  Left ethmoid opacification.  Mild left sphenoid bubbly opacity and opacification.  Frontal sinuses, mastoids and tympanic cavities are clear.  Cerebral volume loss.  Ventriculomegaly probably is ex vacuo related; the temporal horns remain relatively small. No midline shift, mass effect, or evidence of mass lesion.  Moderate heterogeneity of the deep gray matter nuclei compatible with previous lacunar  infarcts.  Patchy cerebral white matter hypodensity. No acute intracranial hemorrhage identified.  No evidence of cortically based acute infarction identified.  Intracranial artery dolichoectasia.  Asymmetric enlargement and irregular contour of the distal cervical left ICA (series 2 image 3 and series 3 image 1) has an appearance most suggestive of partially calcified pseudoaneurysm.  IMPRESSION: 1. No acute intracranial abnormality. 2.  Suspect advanced chronic small vessel ischemia.  Cerebral volume loss with ventriculomegaly which is probably ex vacuo in nature. 3.  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   Original Report Authenticated By: Erskine Speed, M.D.    Mr Brain Wo Contrast  07/11/2012  *RADIOLOGY REPORT*  Clinical Data:  Blurred vision.  Balanced disturbance.  Abnormal head CT.  MRI HEAD WITHOUT CONTRAST MRA HEAD WITHOUT CONTRAST  Technique:  Multiplanar, multiecho pulse sequences of the brain and surrounding structures were obtained without intravenous contrast. Angiographic images of the head were obtained using MRA technique without contrast.  Comparison:  Head CT same day  MRI HEAD  Findings:  Diffusion imaging does not show any acute or subacute infarction.  There are chronic small vessel changes affecting the pons.  There is generalized cerebellar atrophy.  The cerebral hemispheres show generalized atrophy with confluent chronic small vessel changes throughout the hemispheric white matter.  No cortical or large vessel territory infarction.  No evidence of mass lesion, acute hemorrhage, hydrocephalus or extra-axial collection. Foci of hemosiderin deposition are scattered about several of the old small vessel infarctions.  The ventricles are prominent, but this is consistent with central atrophy.  No pituitary mass.  There are inflammatory changes of the maxillary and ethmoid sinuses with hypoplastic  maxillary sinuses.  IMPRESSION: No acute or reversible findings.  Atrophy and extensive chronic small vessel disease throughout the brain.  MRA HEAD  Findings: Both internal carotid arteries are patent into the brain. The anterior and middle cerebral vessels are patent without correctable proximal stenosis, aneurysm or vascular malformation. More distal branch vessels show diffuse atherosclerotic irregularity.  Both vertebral arteries are patent to the basilar with the left being dominant.  No basilar stenosis.  Posterior circulation branch vessels are patent but show atherosclerotic irregularity.  IMPRESSION: No major vessel occlusion or correctable proximal stenosis.  Distal vessel atherosclerotic irregularity diffusely.   Original Report Authenticated By: Paulina Fusi, M.D.    Mr Mra Head/brain Wo Cm  07/11/2012  *RADIOLOGY REPORT*  Clinical Data:  Blurred vision.  Balanced disturbance.  Abnormal head CT.  MRI HEAD WITHOUT CONTRAST MRA HEAD WITHOUT CONTRAST  Technique:  Multiplanar, multiecho pulse sequences of the brain and surrounding structures were obtained without intravenous contrast. Angiographic images of the head were obtained using MRA technique without contrast.  Comparison:  Head CT same day  MRI HEAD  Findings:  Diffusion imaging does not show any acute or subacute infarction.  There are chronic small vessel changes affecting the pons.  There is generalized cerebellar atrophy.  The cerebral hemispheres show generalized atrophy with confluent chronic small vessel changes throughout the hemispheric white matter.  No cortical or large vessel territory infarction.  No evidence of mass lesion, acute hemorrhage, hydrocephalus or extra-axial collection. Foci of hemosiderin deposition are scattered about several of the old small vessel infarctions.  The ventricles are prominent, but this is consistent with central atrophy.  No pituitary mass.  There are inflammatory changes of the maxillary and ethmoid  sinuses with hypoplastic maxillary sinuses.  IMPRESSION: No acute or reversible findings.  Atrophy and extensive chronic small vessel disease throughout the brain.  MRA HEAD  Findings: Both internal carotid arteries are patent into the brain. The anterior and middle cerebral vessels are patent without correctable proximal stenosis, aneurysm or vascular malformation. More distal branch vessels show diffuse atherosclerotic irregularity.  Both vertebral arteries are patent to the basilar with the left being dominant.  No basilar stenosis.  Posterior circulation branch vessels are patent but show atherosclerotic irregularity.  IMPRESSION: No major vessel occlusion or correctable proximal stenosis.  Distal vessel atherosclerotic irregularity diffusely.   Original Report Authenticated By: Paulina Fusi, M.D.     MEDICATIONS                                                                                                                        Continuous: . sodium chloride 75 mL/hr at 07/11/12 2112    ASSESSMENT/PLAN:  79 YO male who presented to the ED after a transient episode of gait instability and blurred vision.  All of his labs have returned normal other than a slightly low TSH.  MRI brain shows no acute infarct.   As stated previously patients gait instability likely secondary to multiple factors including peripheral neuropathy and peripheral vision deficit.   Recommendations.   1) Would recommend a opthalmology consult for poor peripheral vision.  2) No further neurology neurological testing at this time.  Discussed with Dr. Isidoro Donning  Assessment and plan discussed with with attending physician and they are in agreement.    Felicie Morn PA-C [facility] [phone]  07/12/2012, 12:03 PM

## 2012-07-12 NOTE — Evaluation (Signed)
Speech Language Pathology Evaluation Patient Details Name: [name] MRN: [id number] DOB: 28-Oct-1930 Today's Date: 07/12/2012 Time: 4098-1191 SLP Time Calculation (min): 30 min  Problem List:  Patient Active Problem List  Diagnosis  . HYPOTHYROIDISM  . Pure hypercholesterolemia  . TREMOR, ESSENTIAL, LEFT HAND  . HYPERTENSION  . Barrett's esophagus  . DIVERTICULOSIS, COLON  . GALLSTONE PANCREATITIS  . BENIGN PROSTATIC HYPERTROPHY  . EPIDIDYMAL CYST  . FATIGUE  . POLYPECTOMY, HX OF  . DEPRESSION  . URINARY INCONTINENCE, MALE  . Renal mass, right  . Rash  . Medicare annual wellness visit, initial  . Cough  . Pulmonary nodule  . TIA (transient ischemic attack)  . Blurry vision  . Balance problem  . Intracranial atherosclerosis  . Ventricular enlargement due to brain atrophy  . Abnormal heart rhythm   Past Medical History:  Past Medical History  Diagnosis Date  . Hypertension   . Hypothyroidism   . Diverticulosis of colon   . BPH (benign prostatic hypertrophy)   . Pancreatitis 2011    and gallstone pancreatitis and Ecoli bacteremia  . Renal mass 2011    Eval by Dr [name] echogenic mass on u/s. followed by Darlen Round- observation as of 09/2010  . Rabbit fever 1940   Past Surgical History:  Past Surgical History  Procedure Laterality Date  . Appendectomy  1959  . Thyroidectomy, partial  1966  . Knee arthroscopy  06/1995    left Dr Kennith Center  . Joint replacement  04/30/2001    bilateral TKR  . Cholecystectomy  10/2009  . Eye muscle surgery      12/12 had double vision Dr. Earlene Plater [facility]   HPI:  The patient is a 79 y.o. year-old male with history of HTN, hypothyroidism, BPH, eye muscle problem, who presented with off balance sensation and blurry vision 4/9. The patient was last at baseline health 4/8 p.m. 4/9 a.m. he awoke, attempted to stand up, however, his balance was off and slightly to the left. He states his vision was blurry  He denies focal weakness,  numbness, slurred speech, confusion. He was not given TPA in the ER due to out of window for administration. MRI revelaed No acute or reversible findings. Atrophy and extensive chronic small vessel disease throughout the brain.   Assessment / Plan / Recommendation Clinical Impression  Patient presents with mild- moderate cognitive deficits in the areas of memory and attention, however these deficits were reported to be patients baseline per pt and wife. Pt was administered the [employer] standardized asssessment at bedside with mild-mod deficits noted in sustained attention and short-term/ retrieval memory deficits whihch pt is already independently compensating for. No SLP f/u needed at this time as pt is back at baseline from admittance on 4/9. Consider f/u with primary MD for progressive decline in cognitive status over past two years.    SLP Assessment  Patient does not need any further Speech Lanaguage Pathology Services    Follow Up Recommendations  None    Frequency and Duration        Pertinent Vitals/Pain None reported   SLP Goals  SLP Goals Progress/Goals/Alternative treatment plan discussed with pt/caregiver and they: Agree  SLP Evaluation Prior Functioning  Cognitive/Linguistic Baseline: Baseline deficits Baseline deficit details:  (short term memory deficits reported) Type of Home: House Lives With: Spouse Vocation: Retired   IT consultant  Overall Cognitive Status: Impaired at baseline Arousal/Alertness: Awake/alert Orientation Level: Oriented X4 Attention: Sustained Sustained Attention: Impaired Sustained Attention Impairment: Verbal basic Memory:  Impaired Memory Impairment: Decreased short term memory;Storage deficit;Decreased recall of new information Decreased Short Term Memory: Verbal basic Awareness: Appears intact Problem Solving: Appears intact Safety/Judgment: Appears intact    Comprehension  Auditory Comprehension Overall Auditory Comprehension:  Appears within functional limits for tasks assessed Yes/No Questions: Not tested Commands: Within Functional Limits Conversation: Complex Interfering Components: Hearing;Attention EffectiveTechniques: Repetition;Stressing words;Increased volume Visual Recognition/Discrimination Discrimination: Within Function Limits Reading Comprehension Reading Status: Within funtional limits    Expression Expression Primary Mode of Expression: Verbal Verbal Expression Overall Verbal Expression: Appears within functional limits for tasks assessed Initiation: No impairment Repetition: No impairment Naming: No impairment Pragmatics: No impairment Written Expression Dominant Hand: Left Written Expression: Not tested   Oral / Motor Oral Motor/Sensory Function Overall Oral Motor/Sensory Function: Appears within functional limits for tasks assessed Labial ROM: Within Functional Limits Labial Symmetry: Within Functional Limits Labial Strength: Within Functional Limits Labial Sensation: Within Functional Limits Lingual ROM: Within Functional Limits Lingual Symmetry: Within Functional Limits Lingual Strength: Within Functional Limits Lingual Sensation: Within Functional Limits Facial ROM: Within Functional Limits Facial Symmetry: Within Functional Limits Facial Strength: Within Functional Limits Facial Sensation: Within Functional Limits Velum: Within Functional Limits Motor Speech Overall Motor Speech: Appears within functional limits for tasks assessed Phonation: Normal Articulation: Within functional limitis Intelligibility: Intelligible Motor Planning: Witnin functional limits   GO   Berdine Dance SLP student   Berdine Dance 07/12/2012, 2:02 PM

## 2012-07-12 NOTE — Evaluation (Addendum)
Physical Therapy Evaluation Patient Details Name: [name] MRN: [id number] DOB: [birth year] Today's Date: 07/12/2012 Time: 4098-1191 PT Time Calculation (min): 40 min  PT Assessment / Plan / Recommendation Clinical Impression  79 y/o male adm. for balance disturbance and vision deficits. MRI (-) for CVA. Presents to PT with impaired balance. I would recommend RW and OPPT for full balance w/u. He will need supervision for all mobility and wife aware given his risk of falls and impaired safety awareness.     PT Assessment  Patient needs continued PT services    Follow Up Recommendations  Outpatient PT; supervision for all mobility/ambulation    Does the patient have the potential to tolerate intense rehabilitation      Barriers to Discharge        Equipment Recommendations  Rolling walker with 5" wheels    Recommendations for Other Services     Frequency Min 3X/week    Precautions / Restrictions Precautions Precautions: Fall Restrictions Weight Bearing Restrictions: No   Pertinent Vitals/Pain Denies pain      Mobility  Bed Mobility Bed Mobility: Supine to Sit Supine to Sit: 6: Modified independent (Device/Increase time) Transfers Transfers: Sit to Stand;Stand to Sit Sit to Stand: 6: Modified independent (Device/Increase time);With upper extremity assist;From bed Stand to Sit: 6: Modified independent (Device/Increase time);With upper extremity assist;To chair/3-in-1;With armrests Ambulation/Gait Ambulation/Gait Assistance: 4: Min guard Ambulation Distance (Feet): 100 Feet Assistive device: None Ambulation/Gait Assistance Details: increased lateral sway occasionally needing to reach for wall railing for support as he staggered, no major LOB but patient ambulating with decreased speed to compensate; difficulty with dual task activities, turning his head and more dynamic challenges Gait Pattern: Decreased trunk rotation Gait velocity: decreased Stairs: No     Exercises     PT Diagnosis: Abnormality of gait  PT Problem List: Decreased balance;Decreased cognition;Decreased safety awareness PT Treatment Interventions: DME instruction;Gait training;Stair training;Functional mobility training;Therapeutic activities;Therapeutic exercise;Balance training;Neuromuscular re-education;Patient/family education;Cognitive remediation   PT Goals Acute Rehab PT Goals PT Goal Formulation: With patient Time For Goal Achievement: 07/19/12 Potential to Achieve Goals: Good Pt will Ambulate: >150 feet;with modified independence;with least restrictive assistive device PT Goal: Ambulate - Progress: Goal set today Pt will Go Up / Down Stairs: 3-5 stairs;with rail(s);with modified independence PT Goal: Up/Down Stairs - Progress: Goal set today  Visit Information  Last PT Received On: 07/12/12 Assistance Needed: +1    Subjective Data  Subjective: This is just communist.  Patient Stated Goal: home   Prior Functioning  Home Living Lives With: Spouse Type of Home: House Home Access: Stairs to enter [employer] of Steps: 5-6 (railings on the back steps which are the ones they typically use) Entrance Stairs-Rails: Right;Left Home Layout: One level Home Adaptive Equipment: None Additional Comments: wife says she has been trying to get him to use a cane for a while Prior Function Level of Independence: Independent Driving: No Vocation: Retired Musician: No difficulties Dominant Hand: Left    Cognition  Cognition Overall Cognitive Status: Impaired Area of Impairment: Safety/judgement;Awareness of errors;Attention Arousal/Alertness: Awake/alert Orientation Level: Disoriented to;Time Behavior During Session: Restless Current Attention Level: Selective;Alternating Attention - Other Comments: difficulty alternating between our conversation and ambulating in the hallway, pt would often stop and stare off like he was confused by  something Safety/Judgement: Impulsive;Decreased awareness of need for assistance Awareness of Errors: Assistance required to identify errors made Awareness of Errors - Other Comments: patient able to articulate the something is different with the way he  is thinking, said he was normal Monday and this is different, 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 - Other Comments: Couldn't get a great read if this was just him joking around or side stepping my questions because he really didn't know but he wasn't able to articulate why he came to hospital, his wife kept answering for him    Extremity/Trunk Assessment Right Upper Extremity Assessment RUE ROM/Strength/Tone: [facility] for tasks assessed Left Upper Extremity Assessment LUE ROM/Strength/Tone: [facility] for tasks assessed Right Lower Extremity Assessment RLE ROM/Strength/Tone: [facility] for tasks assessed RLE Coordination: WFL - gross motor Left Lower Extremity Assessment LLE ROM/Strength/Tone: WFL for tasks assessed LLE Coordination: WFL - gross motor   Balance Balance Balance Assessed: Yes Static Standing Balance Static Standing - Balance Support: No upper extremity supported Static Standing - Comment/# of Minutes: static standing without challenge pt SBA, when challenged by tasks below he needed mingaurdA-minA Single Leg Stance - Right Leg: 1 Single Leg Stance - Left Leg: 1 Tandem Stance - Right Leg: 0 Tandem Stance - Left Leg: 0 Rhomberg - Eyes Opened: 60  End of Session PT - End of Session Equipment Utilized During Treatment: Gait belt Activity Tolerance: Patient tolerated treatment well Patient left: in chair;with call bell/phone within reach Nurse Communication: Mobility status  GP Functional Limitation: Mobility: Walking and moving around Mobility: Walking and Moving Around Current Status ([id number]): At least 1 percent but less than 20 percent impaired, limited or restricted Mobility: Walking and Moving Around Goal Status [phone]): 0 percent impaired, limited or  restricted   [facility] HELEN 07/12/2012, 12:56 PM

## 2012-07-13 MED ORDER — ASPIRIN 325 MG PO TBEC: 325.0000 mg | DELAYED_RELEASE_TABLET | Freq: Every day | ORAL | Status: DC

## 2012-07-13 MED ORDER — UNABLE TO FIND: Status: DC

## 2012-07-13 MED ORDER — B COMPLEX-C PO TABS: 1.0000 | ORAL_TABLET | Freq: Every day | ORAL | Status: AC

## 2012-07-13 MED ORDER — OCUVITE PRESERVISION PO TABS: 1.0000 | ORAL_TABLET | Freq: Every day | ORAL | Status: DC

## 2012-07-13 NOTE — Care Management Note (Signed)
    Page 1 of 1   07/13/2012     1:47:24 PM   CARE MANAGEMENT NOTE 07/13/2012  Patient:  [name], [name]   Account Number:  [id number]  Date Initiated:  07/13/2012  Documentation initiated by:  Jacquelynn Cree  Subjective/Objective Assessment:   admitted for TIA workup     Action/Plan:   PT/OT evals-recommendede outpatient PT   Anticipated DC Date:  07/13/2012   Anticipated DC Plan:  HOME/SELF CARE      DC Planning Services  CM consult  Outpatient Services - Pt will follow up      Choice offered to / List presented to:             Status of service:  Completed, signed off Medicare Important Message given?   (If response is "NO", the following Medicare IM given date fields will be blank) Date Medicare IM given:   Date Additional Medicare IM given:    Discharge Disposition:  HOME/SELF CARE  Per UR Regulation:  Reviewed for med. necessity/level of care/duration of stay  If discussed at Long Length of Stay Meetings, dates discussed:    Comments:

## 2012-07-13 NOTE — Discharge Summary (Signed)
Physician Discharge Summary  Patient ID: [name] MRN: [id number] DOB/AGE: May 03, 1930 79 y.o.  Admit date: 07/11/2012 Discharge date: 07/13/2012  Primary Care Physician:  Christopher Givens, MD  Discharge Diagnoses:    Blurry peripheral vision with balance problems: Primary opthal problem vs TIA (transient ischemic attack) . HYPERTENSION . HYPOTHYROIDISM . TREMOR, ESSENTIAL, LEFT HAND . Pure hypercholesterolemia  Consults:  Neurology                   Ophthalmology, Dr. Gwen Wilkinson  Discharge Medications:   Medication List    TAKE these medications       aspirin 325 MG EC tablet  Take 1 tablet (325 mg total) by mouth daily.     B-complex with vitamin C tablet  Take 1 tablet by mouth daily.     levothyroxine 100 MCG tablet  Commonly known as:  SYNTHROID, LEVOTHROID  Take 100 mcg by mouth daily before breakfast.     multivitamin tablet  Take 1 tablet by mouth daily.     Ocuvite PreserVision Tabs  Take 1 tablet by mouth daily.     omeprazole 20 MG capsule  Commonly known as:  PRILOSEC  Take 20 mg by mouth every other day.     terazosin 5 MG capsule  Commonly known as:  HYTRIN  TAKE 1 CAPSULE DAILY     UNABLE TO FIND  Out-Patient Physical therapy      Diagnosis: TIA, gait instability         Brief H and P: For complete details please refer to admission H and P, but in briefThe patient is a 79 y.o. year-old male with history of HTN, hypothyroidism, BPH, eye muscle problem s/p surgery at [facility], renal mass being followed by Dr. Patsi Wilkinson who presents with off balance sensation and blurry vision. The patient was last at their baseline health all day today before the admission and when he went to bed. on the morning of admission, 6AM, he woke up to urinate. He swung his legs over the side of the bed and attempted to stand up, however, his balance was off and he lurched forward and slightly to the left into the dresser. He had to hold onto the wall to get to the  bathroom a few feet away. He stated his vision was somehow blurry or "not normal." He has had vertigo in the past with room spinning sensation and nausea and vomiting, however, this sensation was completely different. He also denies presyncopal sensation and states that his symptom of blurry visions is constant and unchanging regardless of whether he sits up or stands up. He denied focal weakness, numbness, slurred speech, confusion. He was not given TPA in the ER due to out of window for administration/unclear onset of symptoms.  In the ER, his labs were essentially normal. 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 No acute strokes were seen on the CT scan. He was seen by Neurology who recommended admission for rule out stroke.    Hospital Course:  Blurry peripheral vision with balance problems: Primary opthal problem vs TIA (transient ischemic attack)  - MRI of the brain showed no acute or reversible finding, atrophy and extensive chronic small vessel disease throughout the brain.  MRA shows no major vessel occlusion or correctable proximal stenosis. Carotid Dopplers showed no ICA stenosis. - Continue aspirin 325 mg daily.  LDL of 54,  cholesterol 113, hemoglobin A1c 5.5   TSH slightly low, RPR nonreactive, B12 and folate in normal range  - Discussed with neurology service who recommended ophthalmology evaluation and they do not strongly feel that it wasa  neurological issue. Patient appeared to have strabismus surgery done at [facility], does not remember his ophthalmologist name in [place]. Patient was evaluated by ophthalmology Dr. Gwen Wilkinson who recommended followup with his primary ophthalmologist and recommended multivitamin and vitamins for slowing down macular degeneration. - 2-D echo showed EF of 65-70% with normal wall motion. Patient's echo also showed in  parasternal view were double line that could be a dissection suggesting CT scan or TEE to further evaluate. Patient did not want to stay inpatient for further testing. I did discuss with the Dr. Gala Wilkinson who reviewed the echo again and there was no flow and on short axis no dissection however there is double line on parasternal view. Patient did not have any symptoms or high risk hence recommend to have CT angiogram with aortic dissection protocol to completely rule out any chronic aortic dissection. I will email patient's PCP, Dr [name] to arrange it out-patient.      HYPOTHYROIDISM:  - Continue Synthroid   GERD: Continue PPI   Abnormal heart rhythm ? Atrial fibrillation on telemetry in ED;- Also checked with central telemetry, patient had no episodes of atrial fibrillation on telemetry. Patient remained in normal sinus rhythm, PVC's on tele but no documented Afib. 3 sets of cardiac enzymes were negative.  Day of Discharge BP 127/76  Pulse 81  Temp(Src) 97.6 F (36.4 C) (Oral)  Resp 18  Ht 5\' 10"  (1.778 m)  Wt 74.8 kg (164 lb 14.5 oz)  BMI 23.66 kg/m2  SpO2 97%  Physical Exam: General: Alert and awake oriented x3 not in any acute distress. CVS: S1-S2 clear no murmur rubs or gallops Chest: clear to auscultation bilaterally, no wheezing rales or rhonchi Abdomen: soft nontender, nondistended, normal bowel sounds Extremities: no cyanosis, clubbing or edema noted bilaterally   The results of significant diagnostics from this hospitalization (including imaging, microbiology, ancillary and laboratory) are listed below for reference.    LAB RESULTS: Basic Metabolic Panel:  Recent Labs Lab 07/11/12 1357 07/11/12 1411 07/11/12 1707  NA 140 143  --   K 4.1 4.1  --   CL 105 105  --   CO2 28  --   --   GLUCOSE 91 96  --   BUN 18 17  --   CREATININE 1.16 1.30 1.19  CALCIUM 9.2  --   --    Liver Function Tests:  Recent Labs Lab 07/11/12 1357  AST 19  ALT 9  ALKPHOS 71   BILITOT 0.7  PROT 6.9  ALBUMIN 3.9   No results found for this basename: LIPASE, AMYLASE,  in the last 168 hours No results found for this basename: AMMONIA,  in the last 168 hours CBC:  Recent Labs Lab 07/11/12 1357 07/11/12 1411 07/11/12 1707  WBC 7.1  --  7.0  NEUTROABS 5.2  --   --   HGB 14.8 15.6 14.4  HCT 42.7 46.0 42.0  MCV 82.1  --  83.2  PLT 209  --  201   Cardiac Enzymes:  Recent Labs Lab 07/11/12 2301 07/12/12 0625  TROPONINI <0.30 <0.30   BNP: No components found with this basename: POCBNP,  CBG:  Recent Labs Lab 07/11/12 1355  GLUCAP 90    Significant Diagnostic Studies:  Dg Chest 2 View  07/11/2012  *  RADIOLOGY REPORT*  Clinical Data: 79 year old male with stroke.  CHEST - 2 VIEW  Comparison: Portable chest radiograph 10/16/2009.  Findings: Improved lung volumes.  Mild cardiomegaly. Other mediastinal contours are within normal limits.  No pneumothorax or pulmonary edema.  No definite pleural effusion or confluent pulmonary opacity. No acute osseous abnormality identified.  IMPRESSION: No acute cardiopulmonary abnormality.   Original Report Authenticated By: Erskine Speed, M.D.    Ct Head Wo Contrast  07/11/2012  *RADIOLOGY REPORT*  Clinical Data: 79 year old male weakness and off balance.  CT HEAD WITHOUT CONTRAST  Technique:  Contiguous axial images were obtained from the base of the skull through the vertex without contrast.  Comparison: None.  Findings: Chronic maxillary sinusitis with mucoperiosteal thickening, sub total opacification of both sinuses, and some associated bubbly opacity.  Polypoid mucosal thickening or retained secretions in the nasal cavity.  Left ethmoid opacification.  Mild left sphenoid bubbly opacity and opacification.  Frontal sinuses, mastoids and tympanic cavities are clear.  Cerebral volume loss.  Ventriculomegaly probably is ex vacuo related; the temporal horns remain relatively small. No midline shift, mass effect, or evidence of  mass lesion.  Moderate heterogeneity of the deep gray matter nuclei compatible with previous lacunar infarcts.  Patchy cerebral white matter hypodensity. No acute intracranial hemorrhage identified.  No evidence of cortically based acute infarction identified.  Intracranial artery dolichoectasia.  Asymmetric enlargement and irregular contour of the distal cervical left ICA (series 2 image 3 and series 3 image 1) has an appearance most suggestive of partially calcified pseudoaneurysm.  IMPRESSION: 1. No acute intracranial abnormality. 2.  Suspect advanced chronic small vessel ischemia.  Cerebral volume loss with ventriculomegaly which is probably ex vacuo in nature. 3.  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   Original Report Authenticated By: Erskine Speed, M.D.    Mr Brain Wo Contrast  07/11/2012  *RADIOLOGY REPORT*  Clinical Data:  Blurred vision.  Balanced disturbance.  Abnormal head CT.  MRI HEAD WITHOUT CONTRAST MRA HEAD WITHOUT CONTRAST  Technique:  Multiplanar, multiecho pulse sequences of the brain and surrounding structures were obtained without intravenous contrast. Angiographic images of the head were obtained using MRA technique without contrast.  Comparison:  Head CT same day  MRI HEAD  Findings:  Diffusion imaging does not show any acute or subacute infarction.  There are chronic small vessel changes affecting the pons.  There is generalized cerebellar atrophy.  The cerebral hemispheres show generalized atrophy with confluent chronic small vessel changes throughout the hemispheric white matter.  No cortical or large vessel territory infarction.  No evidence of mass lesion, acute hemorrhage, hydrocephalus or extra-axial collection. Foci of hemosiderin deposition are scattered about several of the old small vessel infarctions.  The ventricles are prominent, but this is consistent with central atrophy.  No  pituitary mass.  There are inflammatory changes of the maxillary and ethmoid sinuses with hypoplastic maxillary sinuses.  IMPRESSION: No acute or reversible findings.  Atrophy and extensive chronic small vessel disease throughout the brain.  MRA HEAD  Findings: Both internal carotid arteries are patent into the brain. The anterior and middle cerebral vessels are patent without correctable proximal stenosis, aneurysm or vascular malformation. More distal branch vessels show diffuse atherosclerotic irregularity.  Both vertebral arteries are patent to the basilar with the left being dominant.  No basilar stenosis.  Posterior circulation branch vessels are patent but show atherosclerotic irregularity.  IMPRESSION: No major  vessel occlusion or correctable proximal stenosis.  Distal vessel atherosclerotic irregularity diffusely.   Original Report Authenticated By: Paulina Fusi, M.D.    Mr Mra Head/brain Wo Cm  07/11/2012  *RADIOLOGY REPORT*  Clinical Data:  Blurred vision.  Balanced disturbance.  Abnormal head CT.  MRI HEAD WITHOUT CONTRAST MRA HEAD WITHOUT CONTRAST  Technique:  Multiplanar, multiecho pulse sequences of the brain and surrounding structures were obtained without intravenous contrast. Angiographic images of the head were obtained using MRA technique without contrast.  Comparison:  Head CT same day  MRI HEAD  Findings:  Diffusion imaging does not show any acute or subacute infarction.  There are chronic small vessel changes affecting the pons.  There is generalized cerebellar atrophy.  The cerebral hemispheres show generalized atrophy with confluent chronic small vessel changes throughout the hemispheric white matter.  No cortical or large vessel territory infarction.  No evidence of mass lesion, acute hemorrhage, hydrocephalus or extra-axial collection. Foci of hemosiderin deposition are scattered about several of the old small vessel infarctions.  The ventricles are prominent, but this is consistent with  central atrophy.  No pituitary mass.  There are inflammatory changes of the maxillary and ethmoid sinuses with hypoplastic maxillary sinuses.  IMPRESSION: No acute or reversible findings.  Atrophy and extensive chronic small vessel disease throughout the brain.  MRA HEAD  Findings: Both internal carotid arteries are patent into the brain. The anterior and middle cerebral vessels are patent without correctable proximal stenosis, aneurysm or vascular malformation. More distal branch vessels show diffuse atherosclerotic irregularity.  Both vertebral arteries are patent to the basilar with the left being dominant.  No basilar stenosis.  Posterior circulation branch vessels are patent but show atherosclerotic irregularity.  IMPRESSION: No major vessel occlusion or correctable proximal stenosis.  Distal vessel atherosclerotic irregularity diffusely.   Original Report Authenticated By: Paulina Fusi, M.D.    [facility] Study Conclusions  - Left ventricle: The cavity size was normal. There was mild concentric hypertrophy. Systolic function was vigorous. The estimated ejection fraction was in the range of 65% to 70%. Wall motion was normal; there were no regional wall motion abnormalities. Doppler parameters are consistent with abnormal left ventricular relaxation (grade 1 diastolic dysfunction). - Aorta: The aorta is dilated. In parasternal views a double line is seen that could be a dissection. Sueggest ST scan of TEE to further evaluate. - Aortic root: The aortic root was moderately dilated.     Disposition and Follow-up:     Discharge Orders   Future Appointments Provider Department Dept Phone   08/01/2012 11:00 AM [facility] [address] [phone]   Patient to arrive 15 minutes prior to appointment time. No solid food 4 hours prior to exam. Liquids and Medicines are okay.   Future Orders Complete By Expires     Diet - low sodium heart healthy  As directed      Discharge instructions  As directed     Comments:      Please follow-up with your ophthalmologist in [place] in 2 weeks.    Increase activity slowly  As directed         DISPOSITION: Home  DIET: Heart healthy diet  ACTIVITY: As tolerated   DISCHARGE FOLLOW-UP Follow-up Information   Follow up with Christopher Givens, MD. Schedule an appointment as soon as possible for a visit in 10 days.   Contact information:   [address] [place] [address] [phone]  Time spent on Discharge: 40 mins  Signed:   RAI,RIPUDEEP M.D. [facility] 07/13/2012, 12:24 PM Pager: [phone]

## 2012-07-13 NOTE — Progress Notes (Signed)
Physical Therapy Treatment Patient Details Name: [name] Shorten MRN: [id number] DOB: [birth year] Today's Date: 07/13/2012 Time: 4098-1191 PT Time Calculation (min): 40 min  PT Assessment / Plan / Recommendation Comments on Treatment Session  79 y/o male adm. blurry vision and imbalance. TIA vs vision issues. Presents today with improved balance from yesterday however still with balance impairments putting him at increased risk for falls. Will need f/u OPPT (neuro) for balance training and learning to compensate for vision difficulties. Educated wife and patient about his need for supervision for all mobility especially with ADLs and ambulation. Wife appears agreeable.     Follow Up Recommendations  Outpatient PT;Supervision/Assistance - 24 hour     Does the patient have the potential to tolerate intense rehabilitation     Barriers to Discharge        Equipment Recommendations  None recommended by PT    Recommendations for Other Services    Frequency Min 3X/week   Plan Discharge plan remains appropriate;Frequency remains appropriate    Precautions / Restrictions Precautions Precautions: Fall Restrictions Weight Bearing Restrictions: No   Pertinent Vitals/Pain Denies pain; per tele pt's HR up to 105 with frequent PVCs and potential V-tach, session ended    Mobility  Transfers Sit to Stand: 5: Supervision;With upper extremity assist Stand to Sit: 5: Supervision;With upper extremity assist Details for Transfer Assistance: cues for safety and safe hand placement Ambulation/Gait Ambulation/Gait Assistance: 5: Supervision Ambulation Distance (Feet): 500 Feet Assistive device: None Ambulation/Gait Assistance Details: attempted walking with RW however pt holding it in the air and not using it adequately so for the rest of ambulation he walked without; still with increased lateral sway and decreased speed however not needing to reach for upper extremity support as much today;  demonstrating balance deficits and safety awareness impairements (trying to run backward up the stairs and trying to punch the nurse (jokingly in the hallway); see DGI for more balance details Gait Pattern: Step-through pattern;Decreased trunk rotation;Decreased stride length Gait velocity: decreased      PT Goals Acute Rehab PT Goals PT Goal: Ambulate - Progress: Progressing toward goal PT Goal: Up/Down Stairs - Progress: Progressing toward goal  Visit Information  Last PT Received On: 07/13/12 Assistance Needed: +1    Subjective Data  Subjective: I think I will be fine after a few days. Patient Stated Goal: home   Cognition  Cognition Overall Cognitive Status: Impaired Area of Impairment: Safety/judgement;Awareness of errors;Attention;Memory Arousal/Alertness: Awake/alert Orientation Level: Appears intact for tasks assessed Behavior During Session: [facility] for tasks performed Current Attention Level: Selective Attention - Other Comments: still having staring spells where he appears confused Safety/Judgement: Impulsive;Decreased awareness of need for assistance;Decreased safety judgement for tasks assessed Awareness of Errors: Assistance required to identify errors made    Balance  Standardized Balance Assessment Standardized Balance Assessment: Dynamic Gait Index Dynamic Gait Index Level Surface: Mild Impairment Change in Gait Speed: Moderate Impairment Gait with Horizontal Head Turns: Mild Impairment Gait with Vertical Head Turns: Mild Impairment Gait and Pivot Turn: Mild Impairment Step Over Obstacle: Mild Impairment Step Around Obstacles: Mild Impairment Steps: Mild Impairment Total Score: 15  End of Session PT - End of Session Equipment Utilized During Treatment: Gait belt Activity Tolerance: Treatment limited secondary to medical complications (Comment) (per RN pt into V-tach, HR 105, session ended) Patient left: with call bell/phone within reach;in bed Nurse  Communication: Mobility status   GP     [facility] HELEN 07/13/2012, 11:35 AM

## 2012-07-15 ENCOUNTER — Telehealth: Payer: Self-pay | Admitting: Family Medicine

## 2012-07-15 DIAGNOSIS — R931 Abnormal findings on diagnostic imaging of heart and coronary circulation: Secondary | ICD-10-CM

## 2012-07-15 NOTE — Telephone Encounter (Signed)
Please call pt.  He needed a f/u CT angiogram with aortic dissection protocol.  I order this.  I also sent the note to [facility] as a FYI.  Thanks.

## 2012-07-16 NOTE — Telephone Encounter (Signed)
Wife advised.  She says he is scheduled for a CT of the lung at [facility] on 08/01/12.  Can these 2 studies be combined?

## 2012-07-17 NOTE — Telephone Encounter (Signed)
I called radiology.  He doesn't need the plain CT- the CTA should be sufficient to follow the nodule.  We'll address this after I see the CTA.  Thanks.

## 2012-07-17 NOTE — Progress Notes (Signed)
07/12/12 1200  SLP Visit Information  SLP Received On 07/12/12  SLP Time Calculation  SLP Start Time 1115  SLP Stop Time 1145  SLP Time Calculation (min) 30 min  General Information  HPI The patient is a 79 y.o. year-old male with history of HTN, hypothyroidism, BPH, eye muscle problem, who presented with off balance sensation and blurry vision 4/9. The patient was last at baseline health 4/8 p.m. 4/9 a.m. he awoke, attempted to stand up, however, his balance was off and slightly to the left. He states his vision was blurry  He denies focal weakness, numbness, slurred speech, confusion. He was not given TPA in the ER due to out of window for administration. MRI revelaed No acute or reversible findings. Atrophy and extensive chronic small vessel disease throughout the brain.  Prior Functional Status  Cognitive/Linguistic Baseline Baseline deficits  Baseline deficit details (short term memory deficits reported)  Type of Home House  Lives With Spouse  Vocation Retired  IT consultant  Overall Cognitive Status Impaired at baseline  Arousal/Alertness Awake/alert  Orientation Level Oriented to person;Oriented to place;Oriented to situation;Disoriented to time  Attention Sustained  Sustained Attention Impaired  Sustained Attention Impairment Verbal basic  Memory Impaired  Memory Impairment Decreased short term memory;Storage deficit;Decreased recall of new information  Decreased Short Term Memory Verbal basic  Awareness Appears intact  Problem Solving Appears intact  Safety/Judgment Appears intact  Auditory Comprehension  Overall Auditory Comprehension Appears within functional limits for tasks assessed  Yes/No Questions Not tested  Commands [facility]  Conversation Complex  Interfering Components Hearing;Attention  EffectiveTechniques Repetition;Stressing words;Increased volume  Visual Recognition/Discrimination  Discrimination WFL  Reading Comprehension  Reading Status WFL  Expression   Primary Mode of Expression Verbal  Verbal Expression  Overall Verbal Expression Appears within functional limits for tasks assessed  Initiation No impairment  Repetition No impairment  Naming No impairment  Pragmatics No impairment  Written Expression  Dominant Hand Left  Written Expression Not tested  Oral Motor/Sensory Function  Overall Oral Motor/Sensory Function Appears within functional limits for tasks assessed  Labial ROM WFL  Labial Symmetry WFL  Labial Strength WFL  Labial Sensation WFL  Lingual ROM WFL  Lingual Symmetry WFL  Lingual Strength WFL  Lingual Sensation WFL  Facial ROM WFL  Facial Symmetry WFL  Facial Strength WFL  Facial Sensation WFL  Velum WFL  Motor Speech  Overall Motor Speech Appears within functional limits for tasks assessed  Phonation Normal  Articulation WFL  Intelligibility Intelligible  Motor Planning WFL  SLP - End of Session  Activity Tolerance Endurance does not limit participation in activity  Patient left in bed;with call bell/phone within reach;with family/visitor present  Assessment  Clinical Impression Statement Patient presents with mild- moderate cognitive deficits in the areas of memory and attention, however these deficits were reported to be patients baseline per pt and wife. Pt was administered the [employer] standardized asssessment at bedside with mild-mod deficits noted in sustained attention and short-term/ retrieval memory deficits whihch pt is already independently compensating for. No SLP f/u needed at this time as pt is back at baseline from admittance on 4/9. Consider f/u with primary MD for progressive decline in cognitive status over past two years.  SLP Recommendation/Assessment Patient does not need any further Speech Lanaguage Pathology Services  No Skilled Speech Therapy Patient at baseline level of functioning;All education completed  SLP Recommendations  Follow up Recommendations None  SLP Equipment None  recommended by SLP  Individuals Consulted  Consulted and  Agree with Results and Recommendations Patient;Family member/caregiver  Family Member Consulted  spouse  SLP Goals  Progress/Goals/Alternative treatment plan discussed with pt/caregiver and they Agree  SLP G-Codes **NOT FOR INPATIENT CLASS**  Functional Assessment Tool Used skilled clinical judgement  Functional Limitations Memory  Memory Current Status [phone]) CI  Memory Goal Status (U0454) CI  Memory Discharge Status (G9170) CI  SLP Evaluations  $ SLP Speech Visit 1 Procedure  SLP Evaluations  $ SLP EVAL LANGUAGE/SOUND PRODUCTION 1 Procedure

## 2012-07-18 ENCOUNTER — Other Ambulatory Visit: Payer: Medicare PPO

## 2012-07-18 ENCOUNTER — Ambulatory Visit (INDEPENDENT_AMBULATORY_CARE_PROVIDER_SITE_OTHER)
Admission: RE | Admit: 2012-07-18 | Discharge: 2012-07-18 | Disposition: A | Discharge status: Home / Self Care | Payer: Medicare PPO | Source: Ambulatory Visit | Attending: Family Medicine | Admitting: Family Medicine

## 2012-07-18 DIAGNOSIS — R9389 Abnormal findings on diagnostic imaging of other specified body structures: Secondary | ICD-10-CM

## 2012-07-18 DIAGNOSIS — R931 Abnormal findings on diagnostic imaging of heart and coronary circulation: Secondary | ICD-10-CM

## 2012-07-18 MED ORDER — IOHEXOL 350 MG/ML SOLN
100.0000 mL | Freq: Once | INTRAVENOUS | Status: AC | PRN
  Administered 2012-07-18: 100 mL via INTRAVENOUS

## 2012-07-19 ENCOUNTER — Other Ambulatory Visit: Payer: Self-pay | Admitting: Family Medicine

## 2012-07-19 DIAGNOSIS — R911 Solitary pulmonary nodule: Secondary | ICD-10-CM

## 2012-07-19 DIAGNOSIS — I712 Thoracic aortic aneurysm, without rupture: Secondary | ICD-10-CM | POA: Insufficient documentation

## 2012-07-19 NOTE — Assessment & Plan Note (Signed)
No f/u needed after CTA 4/14.

## 2012-07-19 NOTE — Telephone Encounter (Signed)
PATIENT NOFTIED OF RESULTS

## 2012-08-01 ENCOUNTER — Other Ambulatory Visit: Payer: Medicare PPO

## 2012-08-07 ENCOUNTER — Encounter: Payer: Self-pay | Admitting: Cardiovascular Disease

## 2012-08-07 ENCOUNTER — Ambulatory Visit (INDEPENDENT_AMBULATORY_CARE_PROVIDER_SITE_OTHER): Payer: Medicare PPO | Admitting: Cardiovascular Disease

## 2012-08-07 VITALS — BP 132/84 | HR 65 | Ht 71.0 in | Wt 166.5 lb

## 2012-08-07 DIAGNOSIS — I10 Essential (primary) hypertension: Secondary | ICD-10-CM

## 2012-08-07 DIAGNOSIS — I77810 Thoracic aortic ectasia: Secondary | ICD-10-CM

## 2012-08-07 DIAGNOSIS — I712 Thoracic aortic aneurysm, without rupture: Secondary | ICD-10-CM

## 2012-08-07 DIAGNOSIS — G459 Transient cerebral ischemic attack, unspecified: Secondary | ICD-10-CM

## 2012-08-07 NOTE — Progress Notes (Signed)
Patient ID: [name], male    DOB: [birth year], 79 y.o.   MRN: [id number]  HPI Comments: 79 Yo male, patient of Dr. Para March,  who presented to the ED April 2014 after a transient episode of gait instability and blurred vision, concern for TIA.  Carotid ultrasound showed no significant disease. Echocardiogram essentially normal apart from mildly dilated ascending aorta. He presents for evaluation of dilated ascending aorta  CT scan of the chest also confirmed mildly dilated ascending aorta estimated at 4.3 cm. Distal ascending, aortic arch and descending thoracic aorta were not particularly dilated. No mention of bicuspid aortic valve on echocardiogram.  In the hospital, All of his labs have returned normal other than a slightly low TSH.  MRI brain shows no acute infarct.    Neurology felt his  gait instability likely secondary to multiple factors including peripheral neuropathy and peripheral vision deficit.   On presentation today, he feels well with no complaints. He has finished rehabilitation yesterday. He is active, some shortness of breath with heavy exertion, particularly inclines. He denies any significant chest pain.  EKG shows normal sinus rhythm with rate 65 beats per minute with no significant ST or T wave changes      Outpatient Encounter Prescriptions as of 08/07/2012  Medication Sig Dispense Refill  . aspirin EC 325 MG EC tablet Take 1 tablet (325 mg total) by mouth daily.  30 tablet  3  . B Complex-C (B-COMPLEX WITH VITAMIN C) tablet Take 1 tablet by mouth daily.  30 tablet  3  . levothyroxine (SYNTHROID, LEVOTHROID) 100 MCG tablet Take 100 mcg by mouth daily before breakfast.      . Multiple Vitamin (MULTIVITAMIN) tablet Take 1 tablet by mouth daily.        . Multiple Vitamins-Minerals (OCUVITE PRESERVISION) TABS Take 1 tablet by mouth daily.  30 each  3  . omeprazole (PRILOSEC) 20 MG capsule Take 20 mg by mouth every other day.       . terazosin (HYTRIN) 5 MG capsule  TAKE 1 CAPSULE DAILY  90 capsule  3  . [DISCONTINUED] UNABLE TO FIND Out-Patient Physical therapy   Diagnosis: TIA, gait instability  1 Mutually Defined  1   No facility-administered encounter medications on file as of 08/07/2012.    Review of Systems  Constitutional: Negative.   HENT: Negative.   Eyes: Negative.   Respiratory: Negative.   Cardiovascular: Negative.   Gastrointestinal: Negative.   Musculoskeletal: Negative.   Skin: Negative.   Neurological: Negative.   Psychiatric/Behavioral: Negative.   All other systems reviewed and are negative.    BP 132/84  Pulse 65  Ht 5\' 11"  (1.803 m)  Wt 166 lb 8 oz (75.524 kg)  BMI 23.23 kg/m2  Physical Exam  Nursing note and vitals reviewed. Constitutional: He is oriented to person, place, and time. He appears well-developed and well-nourished.  HENT:  Head: Normocephalic.  Nose: Nose normal.  Mouth/Throat: Oropharynx is clear and moist.  Eyes: Conjunctivae are normal. Pupils are equal, round, and reactive to light.  Neck: Normal range of motion. Neck supple. No JVD present.  Cardiovascular: Normal rate, regular rhythm, S1 normal, S2 normal, normal heart sounds and intact distal pulses.  Exam reveals no gallop and no friction rub.   No murmur heard. Pulmonary/Chest: Effort normal and breath sounds normal. No respiratory distress. He has no wheezes. He has no rales. He exhibits no tenderness.  Abdominal: Soft. Bowel sounds are normal. He exhibits no distension. There is  no tenderness.  Musculoskeletal: Normal range of motion. He exhibits no edema and no tenderness.  Lymphadenopathy:    He has no cervical adenopathy.  Neurological: He is alert and oriented to person, place, and time. Coordination normal.  Skin: Skin is warm and dry. No rash noted. No erythema.  Psychiatric: He has a normal mood and affect. His behavior is normal. Judgment and thought content normal.      Assessment and Plan

## 2012-08-07 NOTE — Assessment & Plan Note (Signed)
Blood pressure is well controlled on today's visit. No changes made to the medications. 

## 2012-08-07 NOTE — Assessment & Plan Note (Signed)
Mildly dilated ascending aorta. Recommended we repeat echocardiogram in one year to closely monitor any dilatation. If there is no significant dilation next year, would repeat echocardiogram in 2017 (every 2 years). No mention of bicuspid aortic valve. No other workup at this time.

## 2012-08-07 NOTE — Patient Instructions (Addendum)
You are doing well. No medication changes were made.  We will schedule an echo next year to look at the ascending aorta size (april 2015)  Please call us if you have new issues that need to be addressed before your next appt.   Follow up in one year after echo

## 2012-08-07 NOTE — Assessment & Plan Note (Signed)
Unclear etiology. On aspirin 325 mg daily. MRI showed no acute stroke. Certainly possible he could have small amount of mural thrombus of the ascending aorta aneurysm. If he has repeat event, Palmero have to change to warfarin.

## 2012-09-13 ENCOUNTER — Other Ambulatory Visit: Payer: Self-pay | Admitting: Family Medicine

## 2012-10-31 ENCOUNTER — Encounter: Payer: Self-pay | Admitting: Family Medicine

## 2012-12-28 ENCOUNTER — Emergency Department (HOSPITAL_COMMUNITY)
Admission: EM | Admit: 2012-12-28 | Discharge: 2012-12-28 | Disposition: A | Discharge status: Other Acute Inpatient Hospital | Payer: Medicare PPO | Source: Home / Self Care | Attending: Emergency Medicine | Admitting: Emergency Medicine

## 2012-12-28 ENCOUNTER — Observation Stay (HOSPITAL_COMMUNITY)
Admission: EM | Admit: 2012-12-28 | Discharge: 2012-12-30 | Disposition: A | Discharge status: Home / Self Care | Payer: Medicare PPO | Attending: Internal Medicine | Admitting: Internal Medicine

## 2012-12-28 ENCOUNTER — Encounter (HOSPITAL_COMMUNITY): Payer: Self-pay | Admitting: Emergency Medicine

## 2012-12-28 ENCOUNTER — Emergency Department (HOSPITAL_COMMUNITY): Payer: Medicare PPO

## 2012-12-28 ENCOUNTER — Encounter (HOSPITAL_COMMUNITY): Payer: Self-pay | Admitting: *Deleted

## 2012-12-28 DIAGNOSIS — G459 Transient cerebral ischemic attack, unspecified: Principal | ICD-10-CM

## 2012-12-28 DIAGNOSIS — G250 Essential tremor: Secondary | ICD-10-CM

## 2012-12-28 DIAGNOSIS — R911 Solitary pulmonary nodule: Secondary | ICD-10-CM

## 2012-12-28 DIAGNOSIS — R05 Cough: Secondary | ICD-10-CM

## 2012-12-28 DIAGNOSIS — R112 Nausea with vomiting, unspecified: Secondary | ICD-10-CM

## 2012-12-28 DIAGNOSIS — R55 Syncope and collapse: Secondary | ICD-10-CM | POA: Insufficient documentation

## 2012-12-28 DIAGNOSIS — G319 Degenerative disease of nervous system, unspecified: Secondary | ICD-10-CM

## 2012-12-28 DIAGNOSIS — N508 Other specified disorders of male genital organs: Secondary | ICD-10-CM

## 2012-12-28 DIAGNOSIS — Z79899 Other long term (current) drug therapy: Secondary | ICD-10-CM | POA: Insufficient documentation

## 2012-12-28 DIAGNOSIS — K5730 Diverticulosis of large intestine without perforation or abscess without bleeding: Secondary | ICD-10-CM

## 2012-12-28 DIAGNOSIS — I7121 Aneurysm of the ascending aorta, without rupture: Secondary | ICD-10-CM

## 2012-12-28 DIAGNOSIS — F3289 Other specified depressive episodes: Secondary | ICD-10-CM

## 2012-12-28 DIAGNOSIS — R32 Unspecified urinary incontinence: Secondary | ICD-10-CM

## 2012-12-28 DIAGNOSIS — N400 Enlarged prostate without lower urinary tract symptoms: Secondary | ICD-10-CM

## 2012-12-28 DIAGNOSIS — Z9189 Other specified personal risk factors, not elsewhere classified: Secondary | ICD-10-CM

## 2012-12-28 DIAGNOSIS — I639 Cerebral infarction, unspecified: Secondary | ICD-10-CM

## 2012-12-28 DIAGNOSIS — Z0000 Encounter for general adult medical examination without abnormal findings: Secondary | ICD-10-CM

## 2012-12-28 DIAGNOSIS — R21 Rash and other nonspecific skin eruption: Secondary | ICD-10-CM

## 2012-12-28 DIAGNOSIS — E039 Hypothyroidism, unspecified: Secondary | ICD-10-CM

## 2012-12-28 DIAGNOSIS — I499 Cardiac arrhythmia, unspecified: Secondary | ICD-10-CM

## 2012-12-28 DIAGNOSIS — R42 Dizziness and giddiness: Secondary | ICD-10-CM

## 2012-12-28 DIAGNOSIS — K869 Disease of pancreas, unspecified: Secondary | ICD-10-CM

## 2012-12-28 DIAGNOSIS — K2270 Barrett's esophagus without dysplasia: Secondary | ICD-10-CM

## 2012-12-28 DIAGNOSIS — E860 Dehydration: Secondary | ICD-10-CM

## 2012-12-28 DIAGNOSIS — R059 Cough, unspecified: Secondary | ICD-10-CM

## 2012-12-28 DIAGNOSIS — I672 Cerebral atherosclerosis: Secondary | ICD-10-CM

## 2012-12-28 DIAGNOSIS — E7800 Pure hypercholesterolemia, unspecified: Secondary | ICD-10-CM

## 2012-12-28 DIAGNOSIS — R2689 Other abnormalities of gait and mobility: Secondary | ICD-10-CM

## 2012-12-28 DIAGNOSIS — F329 Major depressive disorder, single episode, unspecified: Secondary | ICD-10-CM

## 2012-12-28 DIAGNOSIS — H538 Other visual disturbances: Secondary | ICD-10-CM

## 2012-12-28 DIAGNOSIS — R5383 Other fatigue: Secondary | ICD-10-CM

## 2012-12-28 DIAGNOSIS — N2889 Other specified disorders of kidney and ureter: Secondary | ICD-10-CM

## 2012-12-28 DIAGNOSIS — I712 Thoracic aortic aneurysm, without rupture: Secondary | ICD-10-CM

## 2012-12-28 DIAGNOSIS — I10 Essential (primary) hypertension: Secondary | ICD-10-CM

## 2012-12-28 DIAGNOSIS — R5381 Other malaise: Secondary | ICD-10-CM

## 2012-12-28 LAB — CBC
MCH: 29.6 pg (ref 26.0–34.0)
Platelets: 207 10*3/uL (ref 150–400)
RBC: 5.1 MIL/uL (ref 4.22–5.81)
RDW: 12.8 % (ref 11.5–15.5)
WBC: 7.8 10*3/uL (ref 4.0–10.5)

## 2012-12-28 LAB — DIFFERENTIAL
Basophils Absolute: 0 10*3/uL (ref 0.0–0.1)
Basophils Relative: 0 % (ref 0–1)
Eosinophils Absolute: 0.1 10*3/uL (ref 0.0–0.7)
Eosinophils Relative: 1 % (ref 0–5)
Lymphs Abs: 1.2 10*3/uL (ref 0.7–4.0)
Neutro Abs: 5.9 10*3/uL (ref 1.7–7.7)
Neutrophils Relative %: 75 % (ref 43–77)

## 2012-12-28 LAB — POCT I-STAT, CHEM 8
Calcium, Ion: 1.1 mmol/L — ABNORMAL LOW (ref 1.13–1.30)
Calcium, Ion: 1.13 mmol/L (ref 1.13–1.30)
Chloride: 105 mEq/L (ref 96–112)
Creatinine, Ser: 1.1 mg/dL (ref 0.50–1.35)
Creatinine, Ser: 1.1 mg/dL (ref 0.50–1.35)
Glucose, Bld: 163 mg/dL — ABNORMAL HIGH (ref 70–99)
Glucose, Bld: 99 mg/dL (ref 70–99)
HCT: 46 % (ref 39.0–52.0)
HCT: 47 % (ref 39.0–52.0)
Hemoglobin: 16 g/dL (ref 13.0–17.0)
TCO2: 23 mmol/L (ref 0–100)
TCO2: 24 mmol/L (ref 0–100)

## 2012-12-28 LAB — GLUCOSE, CAPILLARY: Glucose-Capillary: 99 mg/dL (ref 70–99)

## 2012-12-28 LAB — POCT I-STAT TROPONIN I: Troponin i, poc: 0.01 ng/mL (ref 0.00–0.08)

## 2012-12-28 MED ORDER — SODIUM CHLORIDE 0.9 % IV SOLN
INTRAVENOUS | Status: DC
  Administered 2012-12-28: 21:00:00 via INTRAVENOUS

## 2012-12-28 MED ORDER — ONDANSETRON 4 MG PO TBDP
ORAL_TABLET | ORAL | Status: AC
  Filled 2012-12-28: qty 2

## 2012-12-28 MED ORDER — ONDANSETRON 4 MG PO TBDP
8.0000 mg | ORAL_TABLET | Freq: Once | ORAL | Status: AC
  Administered 2012-12-28: 8 mg via ORAL

## 2012-12-28 NOTE — ED Notes (Signed)
Per pt wife he woke up  around 11 am with some dizziness and syncope episode, pt was taken to urgent care by wife and transported to [facility] by [facility]. Pt have PIV left AC place in urgent care, Zofran 8 mg -ODT given in UC.

## 2012-12-28 NOTE — ED Notes (Signed)
orthatic  Vital  Signs   Laying  148 /78  Pulse   98      Sitting  112  78  Pulse  114   Standing       112/  80    Pulse  110  Got  Real  Dizzy

## 2012-12-28 NOTE — ED Notes (Signed)
Placed  On  Cardiac  monitior  Nasal o2  At  2  l  /  Min

## 2012-12-28 NOTE — ED Notes (Signed)
Dizzy  Nauseated  Vomited  X  3     Started  Today          No  Chest no  Shortness         -  Has  Been  Been  Weak          -        Did not black  Out        -       Alert            Feels  Swimmy  Headed

## 2012-12-28 NOTE — ED Provider Notes (Signed)
Chief Complaint:   Chief Complaint  Patient presents with  . Dizziness    History of Present Illness:   [name] is an 79 year old male who this morning upon awakening developed nausea and vomiting. He felt dizzy, swimming headed, staggery, and off-balance. He had an episode of near-syncope at home. He felt weak all over and had a slight headache. He denies any fever, chills, nasal congestion, rhinorrhea, or cough. He's had no chest pain or shortness of breath. He denies any abdominal pain, diarrhea, or urinary symptoms. The patient states he had an episode of dizziness in the past which were diagnosed as being due to a TIA.  Review of Systems:  Other than noted above, the patient denies any of the following symptoms: Systemic:  No fevers, chills, sweats, weight loss or gain, fatigue, or tiredness. ENT:  No nasal congestion, rhinorrhea, or sore throat. Lungs:  No cough, wheezing, or shortness of breath. Cardiac:  No chest pain, syncope, or presyncope. GI:  No abdominal pain, nausea, vomiting, anorexia, diarrhea, constipation, blood in stool or vomitus. GU:  No dysuria, frequency, or urgency.  PMFSH:  Past medical history, family history, social history, meds, and allergies were reviewed.  He is allergic to sulfa. He takes aspirin, Synthroid, Prilosec, and Hytrin.  Physical Exam:   Vital signs:  BP 152/105  Pulse 97  Temp(Src) 97.3 F (36.3 C) (Oral)  Resp 16  SpO2 96% General:  Alert and oriented.  In no distress.  Skin warm and dry.  Good skin turgor, brisk capillary refill. ENT:  No scleral icterus, moist mucous membranes, no oral lesions, pharynx clear. Lungs:  Breath sounds clear and equal bilaterally.  No wheezes, rales, or rhonchi. Heart:  Rhythm regular, without extrasystoles.  No gallops or murmers. Abdomen:  Soft, nontender, no organomegaly or mass, bowel sounds are normally active. Skin: Clear, warm, and dry.  Good turgor.  Brisk capillary refill.  Labs:   Results for  orders placed during the hospital encounter of 12/28/12  POCT I-STAT, CHEM 8      Result Value Range   Sodium 138  135 - 145 mEq/L   Potassium 4.4  3.5 - 5.1 mEq/L   Chloride 104  96 - 112 mEq/L   BUN 24 (*) 6 - 23 mg/dL   Creatinine, Ser 1.61  0.50 - 1.35 mg/dL   Glucose, Bld 096 (*) 70 - 99 mg/dL   Calcium, Ion 0.45  4.09 - 1.30 mmol/L   TCO2 24  0 - 100 mmol/L   Hemoglobin 16.0  13.0 - 17.0 g/dL   HCT 81.1  91.4 - 78.2 %    EKG Results:  Date: 12/28/2012  Rate: 96  Rhythm: normal sinus rhythm, supraventricular tachycardia (SVT) and premature atrial contractions (PAC)  QRS Axis: left  Intervals: normal  ST/T Wave abnormalities: normal  Conduction Disutrbances:none  Narrative Interpretation: Sinus rhythm with sinus arrhythmia with occasional premature ventricular contractions, left axis deviation, otherwise normal.  Old EKG Reviewed: none available  Course in Urgent Care Center:   He was begun on normal saline at 100 mL per hour.  Assessment:  The primary encounter diagnosis was Nausea and vomiting. Diagnoses of Dizziness and Dehydration were also pertinent to this visit.  Nausea and vomiting, possibly due to gastroenteritis, he appears to be dehydrated.  Plan:   The patient was transferred to the ED via [facility] in stable condition.  Medical Decision Making:  79 year old male has a 1 day history of dizziness, nausea,  vomiting, and near syncope.  On exam, he is alert and oriented.  Slightly orthostatic, with iStat normal except for mildly elevated BUN.  On account of age and suspicion of dehydration, we have started IV and will send to ED via [facility].       Reuben Likes, MD 12/28/12 2106

## 2012-12-28 NOTE — ED Provider Notes (Signed)
CSN: [id number]     Arrival date & time 12/28/12  2124 History   First MD Initiated Contact with Patient 12/28/12 2258     Chief Complaint  Patient presents with  . Near Syncope   (Consider location/radiation/quality/duration/timing/severity/associated sxs/prior Treatment) HPI Comments: Pt is a 79 year old male with hx of recent TIA (normal MRI/MRA, normal Carotid US and normal Echo) in April 2014, presents with recurrent feeling of being off balance - occurred at 11 AM when he tried to get out of bed - sx have been persistent all day long, not related to movement of head or body and associated with n/v X several episodes.  He denies CP, cough, sob, visual changes or focal weakness.  He felt as though he couldn't ambulate 2/2 his balance being off.  He denies vertigo (has had in past) and denies near syncope.  Sx are gradually improving, but have been persistent > 12 hours.  He was seen at [facility] and referred here for further eval.  The history is provided by the patient, medical records and the spouse.    Past Medical History  Diagnosis Date  . Hypertension   . Hypothyroidism   . Diverticulosis of colon   . BPH (benign prostatic hypertrophy)   . Pancreatitis 2011    and gallstone pancreatitis and Ecoli bacteremia  . Renal mass 2011    Eval by Dr [name] echogenic mass on u/s. followed by Darlen Round- observation as of 09/2010  . Rabbit fever 1940   Past Surgical History  Procedure Laterality Date  . Appendectomy  1959  . Thyroidectomy, partial  1966  . Knee arthroscopy  06/1995    left Dr Kennith Center  . Joint replacement  04/30/2001    bilateral TKR  . Cholecystectomy  10/2009  . Eye muscle surgery      12/12 had double vision Dr. Earlene Plater [facility] History  Problem Relation Age of Onset  . Hypertension Mother   . Stroke Mother   . Alcohol abuse Father   . Cancer Brother     prostate CA  . Heart disease Brother    History  Substance Use Topics  . Smoking status: Never Smoker    . Smokeless tobacco: Not on file  . Alcohol Use: No     Comment: minimal, makes wine    Review of Systems  All other systems reviewed and are negative.    Allergies  Sulfonamide derivatives  Home Medications   Current Outpatient Rx  Name  Route  Sig  Dispense  Refill  . aspirin EC 325 MG EC tablet   Oral   Take 1 tablet (325 mg total) by mouth daily.   30 tablet   3   . B Complex-C (B-COMPLEX WITH VITAMIN C) tablet   Oral   Take 1 tablet by mouth daily.   30 tablet   3   . levothyroxine (SYNTHROID, LEVOTHROID) 100 MCG tablet      TAKE 1 TABLET DAILY   90 tablet   1   . Multiple Vitamin (MULTIVITAMIN) tablet   Oral   Take 1 tablet by mouth daily.           . Multiple Vitamins-Minerals (OCUVITE PRESERVISION) TABS   Oral   Take 1 tablet by mouth daily.   30 each   3   . omeprazole (PRILOSEC) 20 MG capsule   Oral   Take 20 mg by mouth every other day.          Marland Kitchen  terazosin (HYTRIN) 5 MG capsule      TAKE 1 CAPSULE DAILY   90 capsule   3    BP 119/85  Pulse 85  Temp(Src) 98 F (36.7 C) (Oral)  Resp 17  SpO2 91% Physical Exam  Nursing note and vitals reviewed. Constitutional: He appears well-developed and well-nourished. No distress.  HENT:  Head: Normocephalic and atraumatic.  Mouth/Throat: Oropharynx is clear and moist. No oropharyngeal exudate.  Tympanic membranes clear bilaterally  Eyes: Conjunctivae and EOM are normal. Pupils are equal, round, and reactive to light. Right eye exhibits no discharge. Left eye exhibits no discharge. No scleral icterus.  Neck: Normal range of motion. Neck supple. No JVD present. No thyromegaly present.  No carotid bruits  Cardiovascular: Normal rate, regular rhythm, normal heart sounds and intact distal pulses.  Exam reveals no gallop and no friction rub.   No murmur heard. Pulmonary/Chest: Effort normal and breath sounds normal. No respiratory distress. He has no wheezes. He has no rales.  Abdominal: Soft.  Bowel sounds are normal. He exhibits no distension and no mass. There is no tenderness.  Musculoskeletal: Normal range of motion. He exhibits no edema and no tenderness.  Lymphadenopathy:    He has no cervical adenopathy.  Neurological: He is alert. Coordination normal.  Minimal ataxia with finger-nose-finger exam, otherwise normal neurologic exam including speech, visual acuity , cranial nerves III through XII, normal strength in all 4 extremities, normal sensation to light touch and pinprick in all 4 extremities, normal reflexes at the patellar tendons and the brachioradialis bilaterally. Gait is slow but according to spouse appears at baseline.  Skin: Skin is warm and dry. No rash noted. No erythema.  Psychiatric: He has a normal mood and affect. His behavior is normal.    ED Course  Procedures (including critical care time) Labs Review Labs Reviewed  COMPREHENSIVE METABOLIC PANEL - Abnormal; Notable for the following:    GFR calc non Af Amer 65 (*)    GFR calc Af Amer 75 (*)    All other components within normal limits  URINALYSIS, ROUTINE W REFLEX MICROSCOPIC - Abnormal; Notable for the following:    APPearance CLOUDY (*)    Ketones, ur 15 (*)    All other components within normal limits  POCT I-STAT, CHEM 8 - Abnormal; Notable for the following:    Calcium, Ion 1.10 (*)    All other components within normal limits  ETHANOL  PROTIME-INR  APTT  CBC  DIFFERENTIAL  GLUCOSE, CAPILLARY  TROPONIN I  URINE RAPID DRUG SCREEN (HOSP PERFORMED)  POCT I-STAT TROPONIN I   Imaging Review Ct Head Wo Contrast  12/29/2012   *RADIOLOGY REPORT*  Clinical Data: Altered mental status, near syncope.  Headache.  CT HEAD WITHOUT CONTRAST  Technique:  Contiguous axial images were obtained from the base of the skull through the vertex without contrast.  Comparison: MRI of the brain July 11, 2012.  Findings: Moderate to severe ventriculomegaly, likely on the basis of global parenchymal brain volume  loss as there is overall commensurate margin of the cerebral sulci and cerebellar folia, similar in appearance to prior examination given differences in imaging technique.  Confluent predominate supratentorial white matter hypodensities seen.  Scattered bilateral basal ganglia and thalamic subcentimeter hypodensities seen most consistent with remote lacunar infarcts as seen on prior exam.  No intraparenchymal hemorrhage, mass effect, midline shift.  No acute large vascular territory infarcts.  No abnormal extra-axial fluid collections.  Dolichoectasia of the intracranial vessels suggestive of  chronic hypertension.  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  The mastoid air cells appear well aerated.  Mild temporomandibular osteoarthrosis.  No skull fracture.  Included ocular globes and orbital contents are not suspicious.  IMPRESSION: No acute intracranial process; overall stable appearance of the brain given differences in imaging technique from MRI of the brain July 11, 2012.  Involutional changes.  Moderate to severe white matter changes suggestive of chronic small vessel ischemic disease.  Remote bilateral basal ganglia and thalamic lacunar infarcts.  Severe chronic maxillary sinusitis.   Original Report Authenticated By: Awilda Metro    MDM   1. Stroke    The patient has slightly elevated blood pressure but otherwise normal vital signs, he is a normal sinus rhythm with occasional ectopy, it appears that based on symptoms the patient has had a posterior circulation stroke, he will need further evaluation and admission to the hospital. He did however have a recent evaluation with echocardiogram, carotid Doppler ultrasounds as well as MRI/MRA of the brain within the last 4 months which did not show any acute findings.  ED ECG REPORT  I personally interpreted this EKG   Date: 12/29/2012   Rate: 92  Rhythm: normal  sinus rhythm  QRS Axis: left  Intervals: normal  ST/T Wave abnormalities: normal  Conduction Disutrbances:none  Narrative Interpretation:   Old EKG Reviewed: Compared with 07/11/2012, ectopy no longer seen, otherwise unchanged EKG  Labs overall unremarkable, UA with small ketones but no other signs of infection or dehydration and specific gravity is normal.  His CT shows old Strokes but no acute findings and no hemorrhage - ASA given.  D/w Dr. Onalee Hua who will admit - temp holding orders written.   Vida Roller, MD 12/29/12 [phone]

## 2012-12-29 ENCOUNTER — Observation Stay (HOSPITAL_COMMUNITY): Payer: Medicare PPO

## 2012-12-29 DIAGNOSIS — E7800 Pure hypercholesterolemia, unspecified: Secondary | ICD-10-CM

## 2012-12-29 DIAGNOSIS — I369 Nonrheumatic tricuspid valve disorder, unspecified: Secondary | ICD-10-CM

## 2012-12-29 DIAGNOSIS — R42 Dizziness and giddiness: Secondary | ICD-10-CM

## 2012-12-29 DIAGNOSIS — E039 Hypothyroidism, unspecified: Secondary | ICD-10-CM

## 2012-12-29 DIAGNOSIS — N289 Disorder of kidney and ureter, unspecified: Secondary | ICD-10-CM

## 2012-12-29 DIAGNOSIS — I10 Essential (primary) hypertension: Secondary | ICD-10-CM

## 2012-12-29 DIAGNOSIS — I672 Cerebral atherosclerosis: Secondary | ICD-10-CM

## 2012-12-29 DIAGNOSIS — G459 Transient cerebral ischemic attack, unspecified: Secondary | ICD-10-CM

## 2012-12-29 DIAGNOSIS — N400 Enlarged prostate without lower urinary tract symptoms: Secondary | ICD-10-CM

## 2012-12-29 DIAGNOSIS — R29818 Other symptoms and signs involving the nervous system: Secondary | ICD-10-CM

## 2012-12-29 LAB — COMPREHENSIVE METABOLIC PANEL
ALT: 7 U/L (ref 0–53)
AST: 14 U/L (ref 0–37)
Albumin: 3.8 g/dL (ref 3.5–5.2)
Alkaline Phosphatase: 71 U/L (ref 39–117)
Calcium: 9 mg/dL (ref 8.4–10.5)
GFR calc non Af Amer: 65 mL/min — ABNORMAL LOW (ref >90)
Glucose, Bld: 98 mg/dL (ref 70–99)
Potassium: 3.9 mEq/L (ref 3.5–5.1)
Sodium: 141 mEq/L (ref 135–145)
Total Protein: 6.9 g/dL (ref 6.0–8.3)

## 2012-12-29 LAB — TSH: TSH: 0.495 u[IU]/mL (ref 0.350–4.500)

## 2012-12-29 LAB — ETHANOL: Alcohol, Ethyl (B): <11 mg/dL (ref 0–11)

## 2012-12-29 LAB — CBC
HCT: 41.1 % (ref 39.0–52.0)
MCH: 29.2 pg (ref 26.0–34.0)
MCV: 83.9 fL (ref 78.0–100.0)
RBC: 4.9 MIL/uL (ref 4.22–5.81)
RDW: 13 % (ref 11.5–15.5)
WBC: 7.4 10*3/uL (ref 4.0–10.5)

## 2012-12-29 LAB — BASIC METABOLIC PANEL
CO2: 26 mEq/L (ref 19–32)
Chloride: 104 mEq/L (ref 96–112)
Creatinine, Ser: 1.12 mg/dL (ref 0.50–1.35)
GFR calc Af Amer: 70 mL/min — ABNORMAL LOW (ref >90)
Potassium: 3.7 mEq/L (ref 3.5–5.1)
Sodium: 140 mEq/L (ref 135–145)

## 2012-12-29 LAB — RAPID URINE DRUG SCREEN, HOSP PERFORMED
Amphetamines: NOT DETECTED
Barbiturates: NOT DETECTED
Benzodiazepines: NOT DETECTED
Cocaine: NOT DETECTED
Tetrahydrocannabinol: NOT DETECTED

## 2012-12-29 LAB — URINALYSIS, ROUTINE W REFLEX MICROSCOPIC
Bilirubin Urine: NEGATIVE
Hgb urine dipstick: NEGATIVE
Ketones, ur: 15 mg/dL — AB
Leukocytes, UA: NEGATIVE
Protein, ur: NEGATIVE mg/dL
Urobilinogen, UA: 0.2 mg/dL (ref 0.0–1.0)

## 2012-12-29 LAB — PROTIME-INR
INR: 1.01 (ref 0.00–1.49)
Prothrombin Time: 13.1 seconds (ref 11.6–15.2)

## 2012-12-29 MED ORDER — ONDANSETRON HCL 4 MG PO TABS: 4.0000 mg | ORAL_TABLET | Freq: Four times a day (QID) | ORAL | Status: DC | PRN

## 2012-12-29 MED ORDER — SODIUM CHLORIDE 0.9 % IV SOLN
INTRAVENOUS | Status: DC
  Administered 2012-12-29: 04:00:00 via INTRAVENOUS

## 2012-12-29 MED ORDER — ASPIRIN EC 325 MG PO TBEC
325.0000 mg | DELAYED_RELEASE_TABLET | Freq: Every day | ORAL | Status: DC
  Administered 2012-12-29 – 2012-12-30 (×2): 325 mg via ORAL
  Filled 2012-12-29 (×3): qty 1

## 2012-12-29 MED ORDER — ASPIRIN 81 MG PO CHEW
324.0000 mg | CHEWABLE_TABLET | Freq: Once | ORAL | Status: AC
  Administered 2012-12-29: 324 mg via ORAL
  Filled 2012-12-29: qty 4

## 2012-12-29 MED ORDER — INFLUENZA VAC SPLIT QUAD 0.5 ML IM SUSP
0.5000 mL | INTRAMUSCULAR | Status: AC
  Administered 2012-12-30: 0.5 mL via INTRAMUSCULAR
  Filled 2012-12-29: qty 0.5

## 2012-12-29 MED ORDER — ONDANSETRON HCL 4 MG/2ML IJ SOLN: 4.0000 mg | Freq: Four times a day (QID) | INTRAMUSCULAR | Status: DC | PRN

## 2012-12-29 MED ORDER — ONDANSETRON HCL 4 MG/2ML IJ SOLN: 4.0000 mg | Freq: Three times a day (TID) | INTRAMUSCULAR | Status: DC | PRN

## 2012-12-29 MED ORDER — ASPIRIN EC 81 MG PO TBEC: 81.0000 mg | DELAYED_RELEASE_TABLET | Freq: Every day | ORAL | Status: DC

## 2012-12-29 MED ORDER — GADOBENATE DIMEGLUMINE 529 MG/ML IV SOLN
15.0000 mL | Freq: Once | INTRAVENOUS | Status: AC | PRN
  Administered 2012-12-29: 15 mL via INTRAVENOUS

## 2012-12-29 MED ORDER — SODIUM CHLORIDE 0.9 % IJ SOLN
3.0000 mL | Freq: Two times a day (BID) | INTRAMUSCULAR | Status: DC
  Administered 2012-12-29 – 2012-12-30 (×3): 3 mL via INTRAVENOUS

## 2012-12-29 NOTE — ED Notes (Signed)
MD Dr. Onalee Hua finished at [facility], report called to primary receiving RN, no changes, ASA given. VSS.

## 2012-12-29 NOTE — ED Notes (Signed)
Partial upper in place

## 2012-12-29 NOTE — Progress Notes (Signed)
UR Completed.  Vangie Bicker [phone] 12/29/2012

## 2012-12-29 NOTE — ED Notes (Signed)
Admitting MD at BS.  

## 2012-12-29 NOTE — Progress Notes (Signed)
  Echocardiogram 2D Echocardiogram has been performed.  Georgian Co 12/29/2012, 12:47 PM

## 2012-12-29 NOTE — H&P (Signed)
PCP:   [name], MD   Chief Complaint:  N/v/dizziness/unsteady  HPI: 79 yo male with h/o double vision s/p surgery to ocular muscles at [facility], htn comes in with episode this am of feeling very dizzy when he woke up and started having some n/v nonbloody.  Has a h/o vertigo but this did not feel like his regular vertigo.  He was dizzy but the room was not spinning.  He was very unsteady when walking and felt very "off".  Denies any fevers.  No headaches.  Denies any vision changes worse than normal.  No diarrhea.  No cp.  No sob.  No abd pain.  No problem speaking or moving his extremities.  Since arrival to ED he feels much better, no symptoms anymore.  He was suppose to f/u with his opthamologisit at [facility] after his last hospitalization in April of this year with similar symptoms but he and his wife are not sure if that has been done.  Review of Systems:  Positive and negative as per HPI otherwise all other systems are negative  Past Medical History: Past Medical History  Diagnosis Date  . Hypertension   . Hypothyroidism   . Diverticulosis of colon   . BPH (benign prostatic hypertrophy)   . Pancreatitis 2011    and gallstone pancreatitis and Ecoli bacteremia  . Renal mass 2011    Eval by Dr [name] echogenic mass on u/s. followed by Darlen Round- observation as of 09/2010  . Rabbit fever 1940   Past Surgical History  Procedure Laterality Date  . Appendectomy  1959  . Thyroidectomy, partial  1966  . Knee arthroscopy  06/1995    left Dr Kennith Center  . Joint replacement  04/30/2001    bilateral TKR  . Cholecystectomy  10/2009  . Eye muscle surgery      12/12 had double vision Dr. Earlene Plater at [facility]    Medications: Prior to Admission medications   Medication Sig Start Date End Date Taking? Authorizing Provider  aspirin EC 325 MG EC tablet Take 1 tablet (325 mg total) by mouth daily. 07/13/12  Yes Ripudeep Jenna Luo, MD  B Complex-C (B-COMPLEX WITH VITAMIN C) tablet Take 1 tablet by mouth daily.  07/13/12  Yes Ripudeep Jenna Luo, MD  levothyroxine (SYNTHROID, LEVOTHROID) 100 MCG tablet TAKE 1 TABLET DAILY 09/13/12  Yes Joaquim Nam, MD  Multiple Vitamin (MULTIVITAMIN) tablet Take 1 tablet by mouth daily.     Yes Historical Provider, MD  Multiple Vitamins-Minerals (OCUVITE PRESERVISION) TABS Take 1 tablet by mouth daily. 07/13/12  Yes Ripudeep Jenna Luo, MD  omeprazole (PRILOSEC) 20 MG capsule Take 20 mg by mouth every other day.    Yes Historical Provider, MD  terazosin (HYTRIN) 5 MG capsule TAKE 1 CAPSULE DAILY 06/06/12  Yes Joaquim Nam, MD    Allergies:   Allergies  Allergen Reactions  . Sulfonamide Derivatives Other (See Comments)    Childhood allergy    Social History:  reports that he has never smoked. He does not have any smokeless tobacco history on file. He reports that he does not drink alcohol or use illicit drugs.  Family History: Family History  Problem Relation Age of Onset  . Hypertension Mother   . Stroke Mother   . Alcohol abuse Father   . Cancer Brother     prostate CA  . Heart disease Brother     Physical Exam: Filed Vitals:   12/29/12 0030 12/29/12 0045 12/29/12 0100 12/29/12 0124  BP: 134/101  126/92  142/95  Pulse: 83 82 78 80  Temp:    98.1 F (36.7 C)  TempSrc:    Oral  Resp: 18 17 19 20   Height:    5\' 10"  (1.778 m)  Weight:    76.1 kg (167 lb 12.3 oz)  SpO2: 94% 95% 97% 94%   General appearance: alert, cooperative and no distress Head: Normocephalic, without obvious abnormality, atraumatic Eyes: negative Nose: Nares normal. Septum midline. Mucosa normal. No drainage or sinus tenderness. Neck: no JVD and supple, symmetrical, trachea midline Lungs: clear to auscultation bilaterally Heart: regular rate and rhythm, S1, S2 normal, no murmur, click, rub or gallop Abdomen: soft, non-tender; bowel sounds normal; no masses,  no organomegaly Extremities: extremities normal, atraumatic, no cyanosis or edema Pulses: 2+ and symmetric Skin: Skin color,  texture, turgor normal. No rashes or lesions Neurologic: Grossly normal    Labs on Admission:   Recent Labs  12/28/12 2315 12/28/12 2346  NA 141 139  K 3.9 3.8  CL 105 105  CO2 24  --   GLUCOSE 98 99  BUN 23 22  CREATININE 1.05 1.10  CALCIUM 9.0  --     Recent Labs  12/28/12 2315  AST 14  ALT 7  ALKPHOS 71  BILITOT 0.6  PROT 6.9  ALBUMIN 3.8    Recent Labs  12/28/12 2315 12/28/12 2346  WBC 7.8  --   NEUTROABS 5.9  --   HGB 15.1 15.6  HCT 42.5 46.0  MCV 83.3  --   PLT 207  --    Radiological Exams on Admission: Ct Head Wo Contrast  12/29/2012   *RADIOLOGY REPORT*  Clinical Data: Altered mental status, near syncope.  Headache.  CT HEAD WITHOUT CONTRAST  Technique:  Contiguous axial images were obtained from the base of the skull through the vertex without contrast.  Comparison: MRI of the brain July 11, 2012.  Findings: Moderate to severe ventriculomegaly, likely on the basis of global parenchymal brain volume loss as there is overall commensurate margin of the cerebral sulci and cerebellar folia, similar in appearance to prior examination given differences in imaging technique.  Confluent predominate supratentorial white matter hypodensities seen.  Scattered bilateral basal ganglia and thalamic subcentimeter hypodensities seen most consistent with remote lacunar infarcts as seen on prior exam.  No intraparenchymal hemorrhage, mass effect, midline shift.  No acute large vascular territory infarcts.  No abnormal extra-axial fluid collections.  Dolichoectasia of the intracranial vessels suggestive of chronic hypertension.  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  The mastoid air cells appear well aerated.  Mild temporomandibular osteoarthrosis.  No skull fracture.  Included ocular globes and orbital contents are not suspicious.  IMPRESSION: No acute intracranial process; overall stable  appearance of the brain given differences in imaging technique from MRI of the brain July 11, 2012.  Involutional changes.  Moderate to severe white matter changes suggestive of chronic small vessel ischemic disease.  Remote bilateral basal ganglia and thalamic lacunar infarcts.  Severe chronic maxillary sinusitis.   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  Cousin be viral syndrome but concerning for possible tia.  If mri abnormal complete full neuro w/u.  Asa.  PT eval.  obs on tele.  ekg neg.  Full code.  Keonte Daubenspeck A 12/29/2012, 1:55 AM

## 2012-12-29 NOTE — Progress Notes (Signed)
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 Dizziness/Vomiting: Patient presented following a transient episode of dizziness without vertigo, as well as vomiting. Head CT scan is negative for acute findings, as is Brain MRI, although severe chronic sinusitis was noted. No arrhythmias were recorded on telemetric monitoring, and 2D Echocardiogram is pending. Currently on Low Dose ASA. Possible viral syndrome, vs TIA. Will ambulate with PT/OT, and observe. Patient is afebrile, wcc is normal, as are U/A and UDS.  2. Dehydration: At presentation, patient had elevated BUN/creatinine ratio, with creatinine 1.05, BUN 23 suggestive of mild dehydration. Managed witrh iv fluids, and renal indices have normalized as of this AM. Clinically, hydration status is now satisfactory.  Have discontinued iv fluids.  3. HTN: Controlled.  4. Hypothyroidism: Continued on thyroxine replacement therapy. TSH is pending.  5. BPH/History of renal mass: Patient has a history of BPH, as well as mass in the right interpolar region of the kidney measuring 2.4 x 1.9 x 1.5 cm suspicious for renal neoplasm, based on imaging studies. Originally noted in 09/2010. Follows up regularly with Dr [name], urologist. .     Code Status: Full Code.  Family Communication:  Disposition Plan: To be determined.    Brief narrative: 79 yo male with h/o double vision s/p surgery to ocular muscles at [facility], HTN, hypothyroidism, diverticulosis, BPH, renal mass, diagnosed 09/2010, presenting in AM of 12/29/12, with episode of feeling very dizzy when he woke up and started having some n/v nonbloody. Has a h/o vertigo but this did not feel like his regular vertigo. He was dizzy but the  room was not spinning. He was very unsteady when walking and felt very "off". Denies any fevers. No headaches. Denies any vision changes worse than normal. No diarrhea. No chest pain. No SOB. No abdominal pain. No problem speaking or moving his extremities. Since arrival to ED, he felt much better, and asymptomatic. He was supposed to f/u with his opthamologisit at [facility] after his last hospitalization in April of this year with similar symptoms, but he and his wife are not sure if that has been done. Admitted for further evaluation and management.    Consultants:  N/A.   Procedures:  Head CT scan.   Antibiotics:  N/A.   HPI/Subjective: No new issues.   Objective: Vital signs in last 24 hours: Temp:  [97.3 F (36.3 C)-98.3 F (36.8 C)] 98 F (36.7 C) (09/27 0544) Pulse Rate:  [64-97] 81 (09/27 0649) Resp:  [15-20] 20 (09/27 0544) BP: (104-152)/(70-107) 108/70 mmHg (09/27 0649) SpO2:  [91 %-98 %] 97 % (09/27 0544) Weight:  [76.1 kg (167 lb 12.3 oz)] 76.1 kg (167 lb 12.3 oz) (09/27 0124) Weight change:  Last BM Date: 12/28/12  Intake/Output from previous day:       Physical Exam: General: Sleeping, but easily rousable. Comfortable, not short of breath at rest.  HEENT:  No clinical pallor, no jaundice, no conjunctival injection or discharge. NECK:  Supple, JVP not seen, no carotid bruits, no palpable lymphadenopathy, no palpable goiter. Hydration is satisfactory.  CHEST:  Clinically clear to auscultation, no wheezes, no crackles. HEART:  Sounds 1 and 2 heard, normal, regular, no murmurs. ABDOMEN:  Full, soft, non-tender, no palpable organomegaly, no palpable masses, normal bowel  sounds. GENITALIA:  Not examined. LOWER EXTREMITIES:  No pitting edema, palpable peripheral pulses. MUSCULOSKELETAL SYSTEM:  Generalized osteoarthritic changes, otherwise, normal. CENTRAL NERVOUS SYSTEM:  No focal neurologic deficit on gross examination.  Lab Results:  Recent Labs   12/28/12 2315 12/28/12 2346 12/29/12 0425  WBC 7.8  --  7.4  HGB 15.1 15.6 14.3  HCT 42.5 46.0 41.1  PLT 207  --  203    Recent Labs  12/28/12 2315 12/28/12 2346 12/29/12 0425  NA 141 139 140  K 3.9 3.8 3.7  CL 105 105 104  CO2 24  --  26  GLUCOSE 98 99 94  BUN 23 22 22   CREATININE 1.05 1.10 1.12  CALCIUM 9.0  --  8.8   No results found for this or any previous visit (from the past 240 hour(s)).   Studies/Results: Ct Head Wo Contrast  12/29/2012   *RADIOLOGY REPORT*  Clinical Data: Altered mental status, near syncope.  Headache.  CT HEAD WITHOUT CONTRAST  Technique:  Contiguous axial images were obtained from the base of the skull through the vertex without contrast.  Comparison: MRI of the brain July 11, 2012.  Findings: Moderate to severe ventriculomegaly, likely on the basis of global parenchymal brain volume loss as there is overall commensurate margin of the cerebral sulci and cerebellar folia, similar in appearance to prior examination given differences in imaging technique.  Confluent predominate supratentorial white matter hypodensities seen.  Scattered bilateral basal ganglia and thalamic subcentimeter hypodensities seen most consistent with remote lacunar infarcts as seen on prior exam.  No intraparenchymal hemorrhage, mass effect, midline shift.  No acute large vascular territory infarcts.  No abnormal extra-axial fluid collections.  Dolichoectasia of the intracranial vessels suggestive of chronic hypertension.  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  The mastoid air cells appear well aerated.  Mild temporomandibular osteoarthrosis.  No skull fracture.  Included ocular globes and orbital contents are not suspicious.  IMPRESSION: No acute intracranial process; overall stable appearance of the brain given differences in imaging technique from MRI of the brain July 11, 2012.  Involutional  changes.  Moderate to severe white matter changes suggestive of chronic small vessel ischemic disease.  Remote bilateral basal ganglia and thalamic lacunar infarcts.  Severe chronic maxillary sinusitis.   Original Report Authenticated By: Awilda Metro    Medications: Scheduled Meds: . aspirin EC  325 mg Oral Daily  . [START ON 12/30/2012] influenza vac split quadrivalent PF  0.5 mL Intramuscular Tomorrow-1000  . sodium chloride  3 mL Intravenous Q12H   Continuous Infusions: . sodium chloride 75 mL/hr at 12/29/12 0348   PRN Meds:.ondansetron (ZOFRAN) IV, ondansetron    LOS: 1 day   Kinslea Frances,[name]  [facility] Pager [phone]. If 8PM-8AM, please contact night-coverage at [email or web address], password [facility] 12/29/2012, 8:39 AM  LOS: 1 day

## 2012-12-30 DIAGNOSIS — R42 Dizziness and giddiness: Secondary | ICD-10-CM

## 2012-12-30 DIAGNOSIS — N289 Disorder of kidney and ureter, unspecified: Secondary | ICD-10-CM

## 2012-12-30 DIAGNOSIS — N400 Enlarged prostate without lower urinary tract symptoms: Secondary | ICD-10-CM

## 2012-12-30 LAB — BASIC METABOLIC PANEL
CO2: 27 mEq/L (ref 19–32)
Calcium: 8.8 mg/dL (ref 8.4–10.5)
Creatinine, Ser: 1.2 mg/dL (ref 0.50–1.35)
GFR calc non Af Amer: 55 mL/min — ABNORMAL LOW (ref >90)
Glucose, Bld: 85 mg/dL (ref 70–99)
Potassium: 3.8 mEq/L (ref 3.5–5.1)
Sodium: 139 mEq/L (ref 135–145)

## 2012-12-30 NOTE — Discharge Summary (Signed)
Physician Discharge Summary  [name] Wilkinson AVW:[id number] DOB: Mar 22, 1931 DOA: 12/28/2012  PCP: [name], MD  Admit date: 12/28/2012 Discharge date: 12/30/2012  Time spent: 40 minutes  Recommendations for Outpatient Follow-up:  1. Follow up with primary MD.   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   Diet recommendation: Heart-Healthy.  Filed Weights   12/29/12 0124  Weight: 76.1 kg (167 lb 12.3 oz)    History of present illness:  79 yo male with h/o double vision s/p surgery to ocular muscles at [facility], HTN, hypothyroidism, diverticulosis, BPH, renal mass, diagnosed 09/2010, presenting in AM of 12/29/12, with episode of feeling very dizzy when he woke up and started having some n/v nonbloody. Has a h/o vertigo but this did not feel like his regular vertigo. He was dizzy but the room was not spinning. He was very unsteady when walking and felt very "off". Denies any fevers. No headaches. Denies any vision changes worse than normal. No diarrhea. No chest pain. No SOB. No abdominal pain. No problem speaking or moving his extremities. Since arrival to ED, he felt much better, and asymptomatic. He was supposed to f/u with his opthamologisit at [facility] after his last hospitalization in April of this year with similar symptoms, but he and his wife are not sure if that has been done. Admitted for further evaluation and management.    Hospital Course:  1. Dizziness/Vomiting: Patient presented following a transient episode of dizziness without vertigo, as well as vomiting. These symptoms were suspicious for possible viral syndrome, vs TIA. Head CT scan was negative for acute findings, as was brain MRI, although severe chronic sinusitis was noted. vascular duplex of 07/12/12 showed no  significant ICA stenosis and vertebral artery flow was antegrade, so this study was not repeated. No arrhythmias were recorded on telemetric monitoring, and 2D Echocardiogram EF of 50% to 55%. Possible very mild hypokinesis of the basal-inferolateral myocardium but this area is not seen well. Aortic valve: Valve area: 3.31cm^2(VTI); 3.47cm^2 (Vmax). LA and RA were mildly dilated. Was on on low dose ASA pre-admission, which has been continued. Patient remained afebrile, wcc was normal, as were U/A and UDS. No recurrence of symptoms, during this hospitalization.  2. Dehydration: At presentation, patient had elevated BUN/creatinine ratio, with creatinine 1.05, BUN 23 suggestive of mild dehydration. Managed with iv fluids, and renal indices normalized as of 12/29/12. IV fluids were therefore, discontinued on that date. Hydration status is now satisfactory.  3. HTN: Controlled.  4. Hypothyroidism: Continued on thyroxine replacement therapy. TSH was normal at 0.495.  5. BPH/History of renal mass: Patient has a history of BPH, as well as mass in the right interpolar region of the kidney measuring 2.4 x 1.9 x 1.5 cm suspicious for renal neoplasm, based on imaging studies. Originally noted in 09/2010. Follows up regularly with Dr [name], urologist.    Procedures:  See Below.  2D Echocardiogram.   Consultations:  N/A.   Discharge Exam: Filed Vitals:   12/30/12 0855  BP: 122/81  Pulse: 88  Temp: 98.3 F (36.8 C)  Resp: 18    General: Sleeping, but easily rousable. Comfortable, not short of breath at rest.  HEENT: No clinical pallor, no jaundice, no conjunctival injection or discharge.  NECK: Supple, JVP not seen, no carotid bruits, no palpable lymphadenopathy,  no palpable goiter. Hydration is satisfactory.  CHEST: Clinically clear to auscultation, no wheezes, no crackles.  HEART: Sounds 1 and 2 heard, normal, regular, no murmurs.  ABDOMEN: Full, soft, non-tender, no palpable organomegaly, no  palpable masses, normal bowel sounds.  GENITALIA: Not examined.  LOWER EXTREMITIES: No pitting edema, palpable peripheral pulses.  MUSCULOSKELETAL SYSTEM: Generalized osteoarthritic changes, otherwise, normal.  CENTRAL NERVOUS SYSTEM: No focal neurologic deficit on gross examination.  Discharge Instructions      Discharge Orders   Future Orders Complete By Expires   Diet - low sodium heart healthy  As directed    Increase activity slowly  As directed        Medication List         aspirin 325 MG EC tablet  Take 1 tablet (325 mg total) by mouth daily.     B-complex with vitamin C tablet  Take 1 tablet by mouth daily.     levothyroxine 100 MCG tablet  Commonly known as:  SYNTHROID, LEVOTHROID  TAKE 1 TABLET DAILY     multivitamin tablet  Take 1 tablet by mouth daily.     OCUVITE PRESERVISION Tabs  Take 1 tablet by mouth daily.     omeprazole 20 MG capsule  Commonly known as:  PRILOSEC  Take 20 mg by mouth every other day.     terazosin 5 MG capsule  Commonly known as:  HYTRIN  TAKE 1 CAPSULE DAILY       Allergies  Allergen Reactions  . Sulfonamide Derivatives Other (See Comments)    Childhood allergy   Follow-up Information   Schedule an appointment as soon as possible for a visit with [name], MD.   Specialty:  [facility]   Contact information:   [address] [place] [address] [phone]        The results of significant diagnostics from this hospitalization (including imaging, microbiology, ancillary and laboratory) are listed below for reference.    Significant Diagnostic Studies: Ct Head Wo Contrast  12/29/2012   *RADIOLOGY REPORT*  Clinical Data: Altered mental status, near syncope.  Headache.  CT HEAD WITHOUT CONTRAST  Technique:  Contiguous axial images were obtained from the base of the skull through the vertex without contrast.  Comparison: MRI of the brain July 11, 2012.  Findings: Moderate to severe  ventriculomegaly, likely on the basis of global parenchymal brain volume loss as there is overall commensurate margin of the cerebral sulci and cerebellar folia, similar in appearance to prior examination given differences in imaging technique.  Confluent predominate supratentorial white matter hypodensities seen.  Scattered bilateral basal ganglia and thalamic subcentimeter hypodensities seen most consistent with remote lacunar infarcts as seen on prior exam.  No intraparenchymal hemorrhage, mass effect, midline shift.  No acute large vascular territory infarcts.  No abnormal extra-axial fluid collections.  Dolichoectasia of the intracranial vessels suggestive of chronic hypertension.  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  The mastoid air cells appear well aerated.  Mild temporomandibular osteoarthrosis.  No skull fracture.  Included ocular globes and orbital contents are not suspicious.  IMPRESSION: No acute intracranial process; overall stable appearance of the brain given differences in imaging technique from MRI of the brain July 11, 2012.  Involutional changes.  Moderate to severe white matter changes suggestive of chronic small vessel ischemic disease.  Remote bilateral basal ganglia and thalamic lacunar infarcts.  Severe chronic maxillary sinusitis.   Original Report Authenticated  By: Awilda Metro   Mr Brain W Wo Contrast  12/29/2012   CLINICAL DATA:  79 year old male with nausea vomiting dizziness gait instability weakness.  EXAM: MRI HEAD WITHOUT AND WITH CONTRAST  TECHNIQUE: Multiplanar, multiecho pulse sequences of the brain and surrounding structures were obtained according to standard protocol without and with intravenous contrast  CONTRAST:  15mL MULTIHANCE GADOBENATE DIMEGLUMINE 529 MG/ML IV SOLN  COMPARISON:  Head CT 12/28/2012. Brain MRI 07/11/2012.  FINDINGS: Stable cerebral volume. No restricted  diffusion or evidence of acute infarction. No midline shift, mass effect, or evidence of intracranial mass lesion. No acute intracranial hemorrhage identified. Major intracranial vascular flow voids are stable. Generalized intracranial artery dolichoectasia.  Chronic ventriculomegaly, although the temporal horns remain fairly diminutive as before. Patchy and confluent nonspecific cerebral white matter T2 and FLAIR hyperintensity. Similar patchy in confluent T2 heterogeneity in the deep gray matter nuclei. Comparatively mild T2 heterogeneity in the brainstem. Cerebellum remains within normal limits for age. No cortical encephalomalacia identified. There are stable scattered chronic micro hemorrhages in the brain, slightly better depicted on 07/11/2012.  No abnormal enhancement identified. Visualized internal auditory structures appear stable and normal. Mastoids are clear.  Chronic sinusitis, with sphenoid and maxillary sinus involvement predominating. Visualized orbit soft tissues are within normal limits. Normal bone marrow signal. Negative visualized cervical spine. Pituitary and cervicomedullary junction stable and within normal limits for age. Negative scalp and orbits soft tissues.  IMPRESSION: 1. No acute intracranial abnormality.  2.  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  3. Advanced chronic signal changes, predominantly in the cerebral white and deep gray matter. Favor chronic small vessel disease.  4. Chronic intracranial artery dolichoectasia.  5. Chronic paranasal sinusitis.   Electronically Signed   By: Augusto Gamble M.D.   On: 12/29/2012 10:16    Microbiology: No results found for this or any previous visit (from the past 240 hour(s)).   Labs: Basic Metabolic Panel:  Recent Labs Lab 12/28/12 2029 12/28/12 2315 12/28/12 2346 12/29/12 0425 12/30/12 0504  NA 138 141 139 140 139  K 4.4 3.9 3.8 3.7 3.8  CL 104 105 105 104 104  CO2  --  24  --  26 27  GLUCOSE 163* 98 99 94  85  BUN 24* 23 22 22  25*  CREATININE 1.10 1.05 1.10 1.12 1.20  CALCIUM  --  9.0  --  8.8 8.8   Liver Function Tests:  Recent Labs Lab 12/28/12 2315  AST 14  ALT 7  ALKPHOS 71  BILITOT 0.6  PROT 6.9  ALBUMIN 3.8   No results found for this basename: LIPASE, AMYLASE,  in the last 168 hours No results found for this basename: AMMONIA,  in the last 168 hours CBC:  Recent Labs Lab 12/28/12 2029 12/28/12 2315 12/28/12 2346 12/29/12 0425  WBC  --  7.8  --  7.4  NEUTROABS  --  5.9  --   --   HGB 16.0 15.1 15.6 14.3  HCT 47.0 42.5 46.0 41.1  MCV  --  83.3  --  83.9  PLT  --  207  --  203   Cardiac Enzymes: No results found for this basename: CKTOTAL, CKMB, CKMBINDEX, TROPONINI,  in the last 168 hours BNP: BNP (last 3 results) No results found for this basename: PROBNP,  in the last 8760 hours CBG:  Recent Labs Lab 12/28/12 2341  GLUCAP 99       Signed:  Manuelita Moxon,[name]  [facility] 12/30/2012, 11:21 AM

## 2012-12-30 NOTE — Progress Notes (Signed)
Utilization Review completed.  

## 2013-01-03 ENCOUNTER — Ambulatory Visit (INDEPENDENT_AMBULATORY_CARE_PROVIDER_SITE_OTHER): Payer: Medicare PPO | Admitting: Family Medicine

## 2013-01-03 ENCOUNTER — Encounter: Payer: Self-pay | Admitting: Family Medicine

## 2013-01-03 VITALS — BP 150/86 | HR 73 | Temp 97.7°F | Wt 169.2 lb

## 2013-01-03 DIAGNOSIS — R911 Solitary pulmonary nodule: Secondary | ICD-10-CM

## 2013-01-03 DIAGNOSIS — R42 Dizziness and giddiness: Secondary | ICD-10-CM

## 2013-01-03 NOTE — Patient Instructions (Addendum)
I think you'll gradually improve.  If you'll let me set up physical therapy, I'll be glad to do it.  Don't change your meds.

## 2013-01-04 NOTE — Assessment & Plan Note (Signed)
Presumed viral episode. Resolved now.   D/w pt about relative deconditioning and PT.  He'll consider.  F/u prn.

## 2013-01-04 NOTE — Progress Notes (Signed)
To recap, likely viral process and less likely TIA led to admission.  Imaging notes and labs reviewed, d/w pt.  Feels better now.  No focal neuro changes.  No NAV now.  "not up to par but improved."   D/w pt about relative deconditioning and PT.  He'll consider.    Meds, vitals, and allergies reviewed.   ROS: See HPI.  Otherwise, noncontributory.  GEN: nad, alert and oriented HEENT: mucous membranes moist NECK: supple w/o LA CV: rrr PULM: ctab, no inc wob ABD: soft, +bs EXT: no edema SKIN: no acute rash

## 2013-03-04 ENCOUNTER — Other Ambulatory Visit: Payer: Self-pay | Admitting: Family Medicine

## 2013-06-21 ENCOUNTER — Other Ambulatory Visit: Payer: Self-pay | Admitting: Family Medicine

## 2013-09-15 ENCOUNTER — Other Ambulatory Visit: Payer: Self-pay | Admitting: Family Medicine

## 2013-09-15 DIAGNOSIS — I10 Essential (primary) hypertension: Secondary | ICD-10-CM

## 2013-09-15 DIAGNOSIS — E039 Hypothyroidism, unspecified: Secondary | ICD-10-CM

## 2013-09-16 ENCOUNTER — Other Ambulatory Visit: Payer: Self-pay | Admitting: Family Medicine

## 2013-09-18 ENCOUNTER — Telehealth: Payer: Self-pay | Admitting: Family Medicine

## 2013-09-18 ENCOUNTER — Encounter: Payer: Self-pay | Admitting: Family Medicine

## 2013-09-18 ENCOUNTER — Ambulatory Visit (INDEPENDENT_AMBULATORY_CARE_PROVIDER_SITE_OTHER): Payer: Medicare HMO | Admitting: Family Medicine

## 2013-09-18 VITALS — BP 126/80 | HR 86 | Temp 97.5°F | Wt 165.5 lb

## 2013-09-18 DIAGNOSIS — B029 Zoster without complications: Secondary | ICD-10-CM

## 2013-09-18 MED ORDER — VALACYCLOVIR HCL 1 G PO TABS: 1000.0000 mg | ORAL_TABLET | Freq: Three times a day (TID) | ORAL | Status: DC

## 2013-09-18 MED ORDER — NAPROXEN SODIUM 220 MG PO TABS: 220.0000 mg | ORAL_TABLET | Freq: Two times a day (BID) | ORAL | Status: DC

## 2013-09-18 MED ORDER — HYDROCODONE-ACETAMINOPHEN 5-325 MG PO TABS: 0.5000 | ORAL_TABLET | Freq: Four times a day (QID) | ORAL | Status: DC | PRN

## 2013-09-18 MED ORDER — TRIAMCINOLONE ACETONIDE 0.5 % EX CREA: 1.0000 "application " | TOPICAL_CREAM | Freq: Three times a day (TID) | CUTANEOUS | Status: DC

## 2013-09-18 NOTE — Assessment & Plan Note (Signed)
Start valtrex, nsaids with Gi caution. TAC for itching. Hydrocodone prn pain if needed, sedation caution.  Routine instructions given to patient.  He agrees. Call back prn.

## 2013-09-18 NOTE — Telephone Encounter (Signed)
Patient Information:  Caller Name: [name]  Phone: [phone]  Patient: [name], [name]  Gender: Male  DOB: July 19, 1930  Age: 80 Years  PCP: Elsie Stain Brigitte Pulse) [facility])  Office Follow Up:  Does the office need to follow up with this patient?: No  Instructions For The Office: N/A   Symptoms  Reason For Call & Symptoms: Raised Itchy burning rash on R arm - patches from wrists to shoulder onset 09/11/13. Rash has nickel and dime sized red bumps and some have scabs -is painful at times. Using Hydrocortisone Cream TID and helps temporiliy.   Reviewed Health History In EMR: Yes  Reviewed Medications In EMR: Yes  Reviewed Allergies In EMR: Yes  Reviewed Surgeries / Procedures: Yes  Date of Onset of Symptoms: 09/11/2013  Treatments Tried: Hydrocortisone Cream, Benedryl, Alleve  Treatments Tried Worked: No  Guideline(s) Used:  Rash or Redness - Localized  Disposition Per Guideline:   See Today in Office  Reason For Disposition Reached:   Localized rash is very painful (no fever)  Advice Given:  Avoid the Cause:   Try to find the cause. Consider irritants like a plant (e.g., poison ivy or evergreens), chemicals (e.g., solvents or insecticides), fiberglass, a new cosmetic, or new jewelry (called contact dermatitis). A pet [place] be carrying the irritating substance (e.g., with poison ivy or poison oak).  Hydrocortisone Cream for Itching:   Keep the cream in the refrigerator (Reason: it feels better if applied cold).  Available over-the-counter in [place] as 0.5% and 1% cream.  Available over-the-counter in [place] as 0.5% cream.  Avoid Scratching:  Try not to scratch. Cut your fingernails short.  Contagiousness:  Adults with localized rashes do not need to miss any work or school.  Expected Course:  Most of these rashes pass in 2 to 3 days.  Call Back If:  Rash spreads or becomes worse  Rash lasts longer than 1 week  You become worse.  Patient Will Follow Care  Advice:  YES  Appointment Scheduled:  09/18/2013 14:30:00 Appointment Scheduled Provider:  Elsie Stain Brigitte Pulse) [facility])

## 2013-09-18 NOTE — Patient Instructions (Signed)
Start valtex today.  Use the cream for the itching.  Take aleve twice a day with food as needed for pain.  If still in pain, then use the hydrocodone.  Take care.  Glad to see you.

## 2013-09-18 NOTE — Progress Notes (Signed)
Pre visit review using our clinic review tool, if applicable. No additional management support is needed unless otherwise documented below in the visit note.  H/o rash on R arm, dermatomal.  Itchy and painful/burning.  Present for a few days.   Meds, vitals, and allergies reviewed.   ROS: See HPI.  Otherwise, noncontributory.  nad R arm with dermatomal rash, vesicles prev present had ruptured.  Change in sensation along the dermatome.

## 2013-09-20 ENCOUNTER — Encounter: Payer: Self-pay | Admitting: Radiology

## 2013-09-20 ENCOUNTER — Other Ambulatory Visit (INDEPENDENT_AMBULATORY_CARE_PROVIDER_SITE_OTHER): Payer: Medicare HMO

## 2013-09-20 DIAGNOSIS — I10 Essential (primary) hypertension: Secondary | ICD-10-CM

## 2013-09-20 DIAGNOSIS — E039 Hypothyroidism, unspecified: Secondary | ICD-10-CM

## 2013-09-20 LAB — COMPREHENSIVE METABOLIC PANEL
ALT: 9 U/L (ref 0–53)
AST: 16 U/L (ref 0–37)
Albumin: 4.1 g/dL (ref 3.5–5.2)
Alkaline Phosphatase: 60 U/L (ref 39–117)
BUN: 22 mg/dL (ref 6–23)
CALCIUM: 9.2 mg/dL (ref 8.4–10.5)
CHLORIDE: 105 meq/L (ref 96–112)
CO2: 26 meq/L (ref 19–32)
CREATININE: 1.2 mg/dL (ref 0.4–1.5)
GFR: 60.05 mL/min (ref >60.00)
Glucose, Bld: 92 mg/dL (ref 70–99)
Potassium: 4 mEq/L (ref 3.5–5.1)
Sodium: 138 mEq/L (ref 135–145)
Total Bilirubin: 1 mg/dL (ref 0.2–1.2)
Total Protein: 6.7 g/dL (ref 6.0–8.3)

## 2013-09-20 LAB — LIPID PANEL
CHOLESTEROL: 141 mg/dL (ref 0–200)
HDL: 47.9 mg/dL (ref >39.00)
LDL Cholesterol: 67 mg/dL (ref 0–99)
NonHDL: 93.1
TRIGLYCERIDES: 131 mg/dL (ref 0.0–149.0)
Total CHOL/HDL Ratio: 3
VLDL: 26.2 mg/dL (ref 0.0–40.0)

## 2013-09-20 LAB — TSH: TSH: 0.19 u[IU]/mL — ABNORMAL LOW (ref 0.35–4.50)

## 2013-09-26 ENCOUNTER — Other Ambulatory Visit: Payer: Self-pay

## 2013-09-26 DIAGNOSIS — I77810 Thoracic aortic ectasia: Secondary | ICD-10-CM

## 2013-09-27 ENCOUNTER — Ambulatory Visit (INDEPENDENT_AMBULATORY_CARE_PROVIDER_SITE_OTHER)
Admission: RE | Admit: 2013-09-27 | Discharge: 2013-09-27 | Disposition: A | Discharge status: Home / Self Care | Payer: Medicare HMO | Source: Ambulatory Visit | Attending: Family Medicine | Admitting: Family Medicine

## 2013-09-27 ENCOUNTER — Ambulatory Visit (INDEPENDENT_AMBULATORY_CARE_PROVIDER_SITE_OTHER): Payer: Medicare HMO | Admitting: Family Medicine

## 2013-09-27 ENCOUNTER — Encounter: Payer: Self-pay | Admitting: Family Medicine

## 2013-09-27 VITALS — BP 146/94 | HR 85 | Temp 97.7°F | Ht 68.0 in | Wt 164.5 lb

## 2013-09-27 DIAGNOSIS — Z7189 Other specified counseling: Secondary | ICD-10-CM

## 2013-09-27 DIAGNOSIS — R0602 Shortness of breath: Secondary | ICD-10-CM

## 2013-09-27 DIAGNOSIS — Z0000 Encounter for general adult medical examination without abnormal findings: Secondary | ICD-10-CM

## 2013-09-27 DIAGNOSIS — R5381 Other malaise: Secondary | ICD-10-CM

## 2013-09-27 DIAGNOSIS — B029 Zoster without complications: Secondary | ICD-10-CM

## 2013-09-27 DIAGNOSIS — R5383 Other fatigue: Secondary | ICD-10-CM

## 2013-09-27 DIAGNOSIS — E039 Hypothyroidism, unspecified: Secondary | ICD-10-CM

## 2013-09-27 DIAGNOSIS — R413 Other amnesia: Secondary | ICD-10-CM

## 2013-09-27 LAB — CBC WITH DIFFERENTIAL/PLATELET
BASOS PCT: 0.6 % (ref 0.0–3.0)
Basophils Absolute: 0 10*3/uL (ref 0.0–0.1)
Eosinophils Absolute: 0.2 10*3/uL (ref 0.0–0.7)
Eosinophils Relative: 2.1 % (ref 0.0–5.0)
HEMATOCRIT: 43.7 % (ref 39.0–52.0)
HEMOGLOBIN: 14.4 g/dL (ref 13.0–17.0)
Lymphocytes Relative: 19.2 % (ref 12.0–46.0)
Lymphs Abs: 1.4 10*3/uL (ref 0.7–4.0)
MCHC: 32.9 g/dL (ref 30.0–36.0)
MCV: 85.7 fl (ref 78.0–100.0)
Monocytes Absolute: 0.5 10*3/uL (ref 0.1–1.0)
Monocytes Relative: 7.4 % (ref 3.0–12.0)
NEUTROS ABS: 5.2 10*3/uL (ref 1.4–7.7)
Neutrophils Relative %: 70.7 % (ref 43.0–77.0)
Platelets: 231 10*3/uL (ref 150.0–400.0)
RBC: 5.1 Mil/uL (ref 4.22–5.81)
RDW: 14.2 % (ref 11.5–15.5)
WBC: 7.3 10*3/uL (ref 4.0–10.5)

## 2013-09-27 LAB — VITAMIN B12: Vitamin B-12: 455 pg/mL (ref 211–911)

## 2013-09-27 LAB — BRAIN NATRIURETIC PEPTIDE: Pro B Natriuretic peptide (BNP): 64 pg/mL (ref 0.0–100.0)

## 2013-09-27 MED ORDER — LEVOTHYROXINE SODIUM 100 MCG PO TABS: ORAL_TABLET | ORAL | Status: DC

## 2013-09-27 NOTE — Progress Notes (Signed)
Pre visit review using our clinic review tool, if applicable. No additional management support is needed unless otherwise documented below in the visit note.  I have personally reviewed the Medicare Annual Wellness questionnaire and have noted 1. The patient's medical and social history 2. Their use of alcohol, tobacco or illicit drugs 3. Their current medications and supplements 4. The patient's functional ability including ADL's, fall risks, home safety risks and hearing or visual             impairment. 5. Diet and physical activities 6. Evidence for depression or mood disorders  The patients weight, height, BMI have been recorded in the chart and visual acuity is per eye clinic.  I have made referrals, counseling and provided education to the patient based review of the above and I have provided the pt with a written personalized care plan for preventive services.  See scanned forms.  Routine anticipatory guidance given to patient.  See health maintenance. Flu 2014 Shingles 2012 PNA 2000 Tetanus 2006 Colon CA screening not due given his age Prostate cancer screening per uro.  Advance directive d/w pt.  Wife designated if patient were incapacitated.  Cognitive function addressed- see scanned forms- and if abnormal then additional documentation follows.   Fatigued.  Less active. Sits in his recliner most of the day.  Stopped walking to his workshop due to exhaustion.  No CP, no BLE, but gets SOB with exertion.  I didn't know about his deconditioning until the OV today.  He is frustrated by not being able to do as much as he prev did.   Memory loss.  Forgets names of people, got lost at a hotel recently. Wife noted changes.  Short term memory losses. No acute changes, but clearly gradually worsening.  I didn't know about the extent of the changes until today.  Still conversant and able to feed self, but clearly couldn't drive a car now.    Hypothyroidism.  TSH d/w pt and wife.  Compliant  with meds.  No ADE.    PMH and SH reviewed  Meds, vitals, and allergies reviewed.   ROS: See HPI.  Otherwise negative.    GEN: nad, alert and but not oriented to year.  HEENT: mucous membranes moist NECK: supple w/o LA CV: rrr. PULM: ctab, no inc wob ABD: soft, +bs EXT: no edema SKIN: no acute rash, resolving rash on R arm from shingles with much less pain now.   1/3 recall.  Can still read a clock and do basic math.   Speech fluent.

## 2013-09-27 NOTE — Patient Instructions (Signed)
Go to the lab on the way out.  We'll contact you with your lab report. Rosaria Ferries will call about your referral for the head scan.  Take care.  I'll be in touch with Dr. Rockey Situ in the meantime.

## 2013-09-29 DIAGNOSIS — Z7189 Other specified counseling: Secondary | ICD-10-CM | POA: Insufficient documentation

## 2013-09-29 DIAGNOSIS — R413 Other amnesia: Secondary | ICD-10-CM | POA: Insufficient documentation

## 2013-09-29 NOTE — Assessment & Plan Note (Signed)
Unclear if this is a primary sx or related to anemia, depression, dementia, cardiopulmonary process, or multifactorial.  No CP.  See notes on CXR and labs.

## 2013-09-29 NOTE — Assessment & Plan Note (Signed)
Decrease replacement given his low TSH.  See instructions.  D/w wife.

## 2013-09-29 NOTE — Assessment & Plan Note (Signed)
See scanned forms.  Routine anticipatory guidance given to patient.  See health maintenance. Flu 2014 Shingles 2012 PNA 2000 Tetanus 2006 Colon CA screening not due given his age Prostate cancer screening per uro.  Advance directive d/w pt.  Wife designated if patient were incapacitated.  Cognitive function addressed- see scanned forms- and if abnormal then additional documentation follows.

## 2013-09-29 NOTE — Assessment & Plan Note (Signed)
Repeat CT pending.  Concern for dementia.  Unclear if pseudodementia, ie dec mood---> deconditioning and memory loss vs memory loss --> depressive sx.  See notes on labs.  D/w wife and patient.  Will get CT before changing any meds.

## 2013-09-29 NOTE — Assessment & Plan Note (Signed)
Less pain, rash resolving.

## 2013-10-02 ENCOUNTER — Other Ambulatory Visit: Payer: Self-pay | Admitting: Family Medicine

## 2013-10-02 ENCOUNTER — Ambulatory Visit (INDEPENDENT_AMBULATORY_CARE_PROVIDER_SITE_OTHER)
Admission: RE | Admit: 2013-10-02 | Discharge: 2013-10-02 | Disposition: A | Discharge status: Home / Self Care | Payer: Medicare HMO | Source: Ambulatory Visit | Attending: Family Medicine | Admitting: Family Medicine

## 2013-10-02 DIAGNOSIS — R413 Other amnesia: Secondary | ICD-10-CM

## 2013-10-02 MED ORDER — DONEPEZIL HCL 10 MG PO TABS: ORAL_TABLET | ORAL | Status: DC

## 2013-10-03 ENCOUNTER — Telehealth: Payer: Self-pay | Admitting: Family Medicine

## 2013-10-03 ENCOUNTER — Other Ambulatory Visit: Payer: Self-pay | Admitting: *Deleted

## 2013-10-03 MED ORDER — DONEPEZIL HCL 10 MG PO TABS: ORAL_TABLET | ORAL | Status: DC

## 2013-10-03 NOTE — Telephone Encounter (Signed)
Patient's wife returned call for patient.

## 2013-10-05 ENCOUNTER — Other Ambulatory Visit: Payer: Self-pay | Admitting: Family Medicine

## 2013-10-05 MED ORDER — DONEPEZIL HCL 10 MG PO TABS: ORAL_TABLET | ORAL | Status: DC

## 2013-10-07 ENCOUNTER — Encounter: Payer: Self-pay | Admitting: Family Medicine

## 2013-10-10 ENCOUNTER — Other Ambulatory Visit (INDEPENDENT_AMBULATORY_CARE_PROVIDER_SITE_OTHER): Payer: Medicare HMO

## 2013-10-10 ENCOUNTER — Other Ambulatory Visit: Payer: Self-pay

## 2013-10-10 DIAGNOSIS — I712 Thoracic aortic aneurysm, without rupture, unspecified: Secondary | ICD-10-CM

## 2013-10-10 DIAGNOSIS — R0602 Shortness of breath: Secondary | ICD-10-CM

## 2013-10-10 DIAGNOSIS — I77810 Thoracic aortic ectasia: Secondary | ICD-10-CM

## 2013-10-16 ENCOUNTER — Encounter: Payer: Self-pay | Admitting: Cardiovascular Disease

## 2013-10-16 ENCOUNTER — Ambulatory Visit (INDEPENDENT_AMBULATORY_CARE_PROVIDER_SITE_OTHER): Payer: Medicare HMO | Admitting: Cardiovascular Disease

## 2013-10-16 VITALS — BP 120/82 | HR 83 | Ht 68.0 in | Wt 163.8 lb

## 2013-10-16 DIAGNOSIS — N2889 Other specified disorders of kidney and ureter: Secondary | ICD-10-CM

## 2013-10-16 DIAGNOSIS — I712 Thoracic aortic aneurysm, without rupture, unspecified: Secondary | ICD-10-CM

## 2013-10-16 DIAGNOSIS — I498 Other specified cardiac arrhythmias: Secondary | ICD-10-CM

## 2013-10-16 DIAGNOSIS — I10 Essential (primary) hypertension: Secondary | ICD-10-CM

## 2013-10-16 DIAGNOSIS — R413 Other amnesia: Secondary | ICD-10-CM

## 2013-10-16 DIAGNOSIS — N289 Disorder of kidney and ureter, unspecified: Secondary | ICD-10-CM

## 2013-10-16 DIAGNOSIS — G454 Transient global amnesia: Secondary | ICD-10-CM

## 2013-10-16 DIAGNOSIS — I7121 Aneurysm of the ascending aorta, without rupture: Secondary | ICD-10-CM

## 2013-10-16 NOTE — Assessment & Plan Note (Signed)
No further TIA symptoms

## 2013-10-16 NOTE — Patient Instructions (Signed)
You are doing well. No medication changes were made.  Please call us if you have new issues that need to be addressed before your next appt.  Your physician wants you to follow-up in: 12 months.  You will receive a reminder letter in the mail two months in advance. If you don't receive a letter, please call our office to schedule the follow-up appointment. 

## 2013-10-16 NOTE — Assessment & Plan Note (Addendum)
Currently on Hytrin. We have suggested to his wife that if he has lightheaded spells when standing, this dose could be cut in half or stopped

## 2013-10-16 NOTE — Assessment & Plan Note (Signed)
Worsening symptoms over the past year. Now very sedentary, possibly depressed. No longer going to his workshop, no hobbies. Sits in his chair most of the day

## 2013-10-16 NOTE — Assessment & Plan Note (Signed)
He has followup with urology for his renal mass. He would be a poor surgical candidate given his neurologic issues

## 2013-10-16 NOTE — Assessment & Plan Note (Signed)
Stable 4 cm aorta. Likely will not progress at a rapid rate. Could consider repeat imaging in 2 years.

## 2013-10-16 NOTE — Progress Notes (Signed)
Patient ID: [name], male    DOB: 11-01-1930, 80 y.o.   MRN: [id number]  HPI Comments: 80 Yo male, patient of Dr. Damita Dunnings,  who presented to the ED April 2014 after a transient episode of gait instability and blurred vision, concern for TIA.  Carotid ultrasound showed no significant disease. Echocardiogram essentially normal apart from mildly dilated ascending aorta. He presents for evaluation of dilated ascending aorta  Previous CT scan of the chest in 2014  confirmed mildly dilated ascending aorta estimated at 4.3 cm. Distal ascending, aortic arch and descending thoracic aorta were not particularly dilated. No mention of bicuspid aortic valve on echocardiogram.  Repeat echocardiogram last week showed stable cardiac function, stable mildly dilated ascending aorta.  Previous  MRI brain shows no acute infarct.     prior evaluation by Neurology felt his  gait instability likely secondary to multiple factors including peripheral neuropathy and peripheral vision deficit.   In followup today, his wife reports that his memory is worse. He is sedentary, does not do any significant activities. Chronic mild shortness of breath with exertion, particularly inclines. He denies any significant chest pain.  EKG shows normal sinus rhythm with rate 83 beats per minute with no significant ST or T wave changes      Outpatient Encounter Prescriptions as of 10/16/2013  Medication Sig  . aspirin EC 325 MG EC tablet Take 1 tablet (325 mg total) by mouth daily.  . B Complex-C (B-COMPLEX WITH VITAMIN C) tablet Take 1 tablet by mouth daily.  Marland Kitchen donepezil (ARICEPT) 10 MG tablet 5mg  a day for a month then 10mg  a day thereafter.  . levothyroxine (SYNTHROID, LEVOTHROID) 100 MCG tablet TAKE 1 TABLET EVERY DAY EXCEPT FOR 1/2 ON SUNDAYS  . Multiple Vitamin (MULTIVITAMIN) tablet Take 1 tablet by mouth daily.    . Multiple Vitamins-Minerals (OCUVITE PRESERVISION) TABS Take 1 tablet by mouth daily.  Marland Kitchen omeprazole  (PRILOSEC) 20 MG capsule Take 20 mg by mouth every other day.   . terazosin (HYTRIN) 5 MG capsule TAKE 1 CAPSULE EVERY DAY  . [DISCONTINUED] triamcinolone cream (KENALOG) 0.5 % Apply 1 application topically 3 (three) times daily. As needed for itching    Review of Systems  Constitutional: Negative.   HENT: Negative.   Eyes: Negative.   Respiratory: Negative.   Cardiovascular: Negative.   Gastrointestinal: Negative.   Endocrine: Negative.   Musculoskeletal: Positive for gait problem.  Skin: Negative.   Allergic/Immunologic: Negative.   Neurological: Negative.   Hematological: Negative.   Psychiatric/Behavioral: Positive for dysphoric mood.  All other systems reviewed and are negative.   BP 120/82  Pulse 83  Ht 5\' 8"  (1.727 m)  Wt 163 lb 12 oz (74.277 kg)  BMI 24.90 kg/m2  Physical Exam  Nursing note and vitals reviewed. Constitutional: He is oriented to person, place, and time. He appears well-developed and well-nourished.  HENT:  Head: Normocephalic.  Nose: Nose normal.  Mouth/Throat: Oropharynx is clear and moist.  Eyes: Conjunctivae are normal. Pupils are equal, round, and reactive to light.  Neck: Normal range of motion. Neck supple. No JVD present.  Cardiovascular: Normal rate, regular rhythm, S1 normal, S2 normal, normal heart sounds and intact distal pulses.  Exam reveals no gallop and no friction rub.   No murmur heard. Pulmonary/Chest: Effort normal and breath sounds normal. No respiratory distress. He has no wheezes. He has no rales. He exhibits no tenderness.  Abdominal: Soft. Bowel sounds are normal. He exhibits no distension. There is no  tenderness.  Musculoskeletal: Normal range of motion. He exhibits no edema and no tenderness.  Lymphadenopathy:    He has no cervical adenopathy.  Neurological: He is alert and oriented to person, place, and time. Coordination normal.  Skin: Skin is warm and dry. No rash noted. No erythema.  Psychiatric: He has a normal mood  and affect. His behavior is normal. Judgment and thought content normal.      Assessment and Plan

## 2013-12-18 ENCOUNTER — Other Ambulatory Visit: Payer: Self-pay | Admitting: Family Medicine

## 2013-12-23 ENCOUNTER — Ambulatory Visit (INDEPENDENT_AMBULATORY_CARE_PROVIDER_SITE_OTHER): Payer: Medicare HMO

## 2013-12-23 DIAGNOSIS — Z23 Encounter for immunization: Secondary | ICD-10-CM

## 2014-01-12 ENCOUNTER — Encounter (HOSPITAL_COMMUNITY): Payer: Self-pay | Admitting: Emergency Medicine

## 2014-01-12 ENCOUNTER — Emergency Department (HOSPITAL_COMMUNITY)
Admission: EM | Admit: 2014-01-12 | Discharge: 2014-01-13 | Disposition: A | Discharge status: Home / Self Care | Payer: Medicare HMO | Attending: Emergency Medicine | Admitting: Emergency Medicine

## 2014-01-12 DIAGNOSIS — N400 Enlarged prostate without lower urinary tract symptoms: Secondary | ICD-10-CM | POA: Insufficient documentation

## 2014-01-12 DIAGNOSIS — Z8719 Personal history of other diseases of the digestive system: Secondary | ICD-10-CM | POA: Diagnosis not present

## 2014-01-12 DIAGNOSIS — N3000 Acute cystitis without hematuria: Secondary | ICD-10-CM | POA: Diagnosis not present

## 2014-01-12 DIAGNOSIS — E039 Hypothyroidism, unspecified: Secondary | ICD-10-CM | POA: Diagnosis not present

## 2014-01-12 DIAGNOSIS — I10 Essential (primary) hypertension: Secondary | ICD-10-CM | POA: Insufficient documentation

## 2014-01-12 DIAGNOSIS — R112 Nausea with vomiting, unspecified: Secondary | ICD-10-CM | POA: Diagnosis present

## 2014-01-12 DIAGNOSIS — F0390 Unspecified dementia without behavioral disturbance: Secondary | ICD-10-CM | POA: Diagnosis not present

## 2014-01-12 DIAGNOSIS — R1031 Right lower quadrant pain: Secondary | ICD-10-CM | POA: Insufficient documentation

## 2014-01-12 DIAGNOSIS — Z8619 Personal history of other infectious and parasitic diseases: Secondary | ICD-10-CM | POA: Diagnosis not present

## 2014-01-12 DIAGNOSIS — Z79899 Other long term (current) drug therapy: Secondary | ICD-10-CM | POA: Diagnosis not present

## 2014-01-12 DIAGNOSIS — Z7982 Long term (current) use of aspirin: Secondary | ICD-10-CM | POA: Insufficient documentation

## 2014-01-12 DIAGNOSIS — E860 Dehydration: Secondary | ICD-10-CM | POA: Diagnosis not present

## 2014-01-12 HISTORY — DX: Unspecified dementia, unspecified severity, without behavioral disturbance, psychotic disturbance, mood disturbance, and anxiety: F03.90

## 2014-01-12 LAB — CBC WITH DIFFERENTIAL/PLATELET
Basophils Absolute: 0.1 10*3/uL (ref 0.0–0.1)
Basophils Relative: 1 % (ref 0–1)
EOS ABS: 0.3 10*3/uL (ref 0.0–0.7)
Eosinophils Relative: 5 % (ref 0–5)
HEMATOCRIT: 41.3 % (ref 39.0–52.0)
Hemoglobin: 13.7 g/dL (ref 13.0–17.0)
LYMPHS ABS: 1.4 10*3/uL (ref 0.7–4.0)
LYMPHS PCT: 21 % (ref 12–46)
MCH: 27.1 pg (ref 26.0–34.0)
MCHC: 33.2 g/dL (ref 30.0–36.0)
MCV: 81.6 fL (ref 78.0–100.0)
MONO ABS: 0.7 10*3/uL (ref 0.1–1.0)
Monocytes Relative: 11 % (ref 3–12)
Neutro Abs: 4.3 10*3/uL (ref 1.7–7.7)
Neutrophils Relative %: 62 % (ref 43–77)
Platelets: 237 10*3/uL (ref 150–400)
RBC: 5.06 MIL/uL (ref 4.22–5.81)
RDW: 13 % (ref 11.5–15.5)
WBC: 6.7 10*3/uL (ref 4.0–10.5)

## 2014-01-12 LAB — COMPREHENSIVE METABOLIC PANEL
ALT: 19 U/L (ref 0–53)
AST: 31 U/L (ref 0–37)
Albumin: 3.6 g/dL (ref 3.5–5.2)
Alkaline Phosphatase: 83 U/L (ref 39–117)
Anion gap: 14 (ref 5–15)
BUN: 38 mg/dL — ABNORMAL HIGH (ref 6–23)
CALCIUM: 9.2 mg/dL (ref 8.4–10.5)
CO2: 26 mEq/L (ref 19–32)
CREATININE: 1.39 mg/dL — AB (ref 0.50–1.35)
Chloride: 104 mEq/L (ref 96–112)
GFR, EST AFRICAN AMERICAN: 53 mL/min — AB (ref >90)
GFR, EST NON AFRICAN AMERICAN: 46 mL/min — AB (ref >90)
GLUCOSE: 104 mg/dL — AB (ref 70–99)
Potassium: 3.9 mEq/L (ref 3.7–5.3)
Sodium: 144 mEq/L (ref 137–147)
TOTAL PROTEIN: 7.4 g/dL (ref 6.0–8.3)
Total Bilirubin: 0.5 mg/dL (ref 0.3–1.2)

## 2014-01-12 MED ORDER — SODIUM CHLORIDE 0.9 % IV BOLUS (SEPSIS)
1000.0000 mL | Freq: Once | INTRAVENOUS | Status: AC
  Administered 2014-01-13: 1000 mL via INTRAVENOUS

## 2014-01-12 NOTE — ED Notes (Signed)
Spouse reported that pt. has been " sick " for the past 2 weeks with multiple emesis , diarrhea , generalized weakness/dizziness , denies fever or chills . Pt. has dementia and hearing deficit .

## 2014-01-13 ENCOUNTER — Emergency Department (HOSPITAL_COMMUNITY): Payer: Medicare HMO

## 2014-01-13 ENCOUNTER — Encounter (HOSPITAL_COMMUNITY): Payer: Self-pay | Admitting: Radiology

## 2014-01-13 ENCOUNTER — Telehealth: Payer: Self-pay | Admitting: Family Medicine

## 2014-01-13 LAB — LIPASE, BLOOD: Lipase: 22 U/L (ref 11–59)

## 2014-01-13 LAB — URINALYSIS, ROUTINE W REFLEX MICROSCOPIC
Bilirubin Urine: NEGATIVE
Glucose, UA: NEGATIVE mg/dL
Hgb urine dipstick: NEGATIVE
Ketones, ur: 15 mg/dL — AB
NITRITE: NEGATIVE
PROTEIN: NEGATIVE mg/dL
Specific Gravity, Urine: 1.027 (ref 1.005–1.030)
Urobilinogen, UA: 1 mg/dL (ref 0.0–1.0)
pH: 6 (ref 5.0–8.0)

## 2014-01-13 LAB — URINE MICROSCOPIC-ADD ON

## 2014-01-13 LAB — I-STAT CG4 LACTIC ACID, ED: Lactic Acid, Venous: 1.49 mmol/L (ref 0.5–2.2)

## 2014-01-13 MED ORDER — IOHEXOL 300 MG/ML  SOLN
100.0000 mL | Freq: Once | INTRAMUSCULAR | Status: AC | PRN
  Administered 2014-01-13: 100 mL via INTRAVENOUS

## 2014-01-13 MED ORDER — ONDANSETRON HCL 4 MG PO TABS: 4.0000 mg | ORAL_TABLET | Freq: Three times a day (TID) | ORAL | Status: DC | PRN

## 2014-01-13 MED ORDER — CEPHALEXIN 500 MG PO CAPS: 500.0000 mg | ORAL_CAPSULE | Freq: Two times a day (BID) | ORAL | Status: DC

## 2014-01-13 MED ORDER — CEPHALEXIN 250 MG PO CAPS
500.0000 mg | ORAL_CAPSULE | Freq: Once | ORAL | Status: AC
  Administered 2014-01-13: 500 mg via ORAL
  Filled 2014-01-13: qty 2

## 2014-01-13 MED ORDER — ONDANSETRON HCL 4 MG/2ML IJ SOLN
4.0000 mg | Freq: Once | INTRAMUSCULAR | Status: AC
  Administered 2014-01-13: 4 mg via INTRAVENOUS
  Filled 2014-01-13: qty 2

## 2014-01-13 NOTE — ED Provider Notes (Signed)
CSN: [id number]     Arrival date & time 01/12/14  2038 History   First MD Initiated Contact with Patient 01/12/14 2359     Chief Complaint  Patient presents with  . Emesis  . Diarrhea     (Consider location/radiation/quality/duration/timing/severity/associated sxs/prior Treatment) HPI [name] is a 80 y.o. male with past medical history of hypertension and diverticulosis coming in with nausea vomiting and diarrhea. Patient has a history of dementia as well as unable to provide his own history. Per his wife, this has been going on for 2 weeks. He has a history of vertigo and was being treated with his home medication without any relief. She states he has episodes of emesis even when he is not eating. She is concerned he is dehydrated. There's been no blood in the vomit or diarrhea. Last time this occurred patient had his gallbladder removed and also had pancreatitis at the time.  She denies her complaining of any pain, there's been no fevers sweats or shaking chills. She denies him complaining of chest pain or shortness of breath.   Past Medical History  Diagnosis Date  . Hypertension   . Hypothyroidism   . Diverticulosis of colon   . BPH (benign prostatic hypertrophy)   . Pancreatitis 2011    and gallstone pancreatitis and Ecoli bacteremia  . 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 followed by [name]- observation as of 09/2010  . Rabbit fever 1940  . History of shingles   . Dementia    Past Surgical History  Procedure Laterality Date  . Appendectomy  1959  . Thyroidectomy, partial  1966  . Knee arthroscopy  06/1995    left Dr Derrel Nip  . Joint replacement  04/30/2001    bilateral TKR  . Cholecystectomy  10/2009  . Eye muscle surgery      12/12 had double vision Dr. Juleen China at [facility] History  Problem Relation Age of Onset  . Hypertension Mother   . Stroke Mother   . Alcohol abuse Father   . Cancer Brother     prostate CA  . Heart disease  Brother    History  Substance Use Topics  . Smoking status: Never Smoker   . Smokeless tobacco: Never Used  . Alcohol Use: No     Comment: minimal, makes wine    Review of Systems  Unable to perform ROS: Dementia      Allergies  Sulfonamide derivatives and Valtrex  Home Medications   Prior to Admission medications   Medication Sig Start Date End Date Taking? Authorizing Provider  aspirin EC 325 MG EC tablet Take 1 tablet (325 mg total) by mouth daily. 07/13/12   Ripudeep Krystal Eaton, MD  B Complex-C (B-COMPLEX WITH VITAMIN C) tablet Take 1 tablet by mouth daily. 07/13/12   Ripudeep Krystal Eaton, MD  donepezil (ARICEPT) 10 MG tablet 5mg  a day for a month then 10mg  a day thereafter. 10/05/13   Tonia Ghent, MD  levothyroxine (SYNTHROID, LEVOTHROID) 100 MCG tablet TAKE 1 TABLET EVERY DAY EXCEPT FOR 1/2 ON SUNDAYS 09/27/13   Tonia Ghent, MD  Multiple Vitamin (MULTIVITAMIN) tablet Take 1 tablet by mouth daily.      Historical Provider, MD  Multiple Vitamins-Minerals (OCUVITE PRESERVISION) TABS Take 1 tablet by mouth daily. 07/13/12   Ripudeep Krystal Eaton, MD  omeprazole (PRILOSEC) 20 MG capsule Take 20 mg by mouth every other day.     Historical  Provider, MD  terazosin (HYTRIN) 5 MG capsule TAKE 1 CAPSULE EVERY DAY 12/18/13   Tonia Ghent, MD   BP 176/103  Pulse 68  Temp(Src) 97.5 F (36.4 C)  Resp 26  Ht 5\' 9"  (1.753 m)  Wt 165 lb (74.844 kg)  BMI 24.36 kg/m2  SpO2 95% Physical Exam  Nursing note and vitals reviewed. Constitutional: Vital signs are normal. He appears well-developed and well-nourished.  Non-toxic appearance. He does not appear ill. No distress.  HENT:  Head: Normocephalic and atraumatic.  Nose: Nose normal.  Mouth/Throat: Oropharynx is clear and moist. No oropharyngeal exudate.  Eyes: Conjunctivae and EOM are normal. Pupils are equal, round, and reactive to light. No scleral icterus.  Neck: Normal range of motion. Neck supple. No tracheal deviation, no edema, no erythema  and normal range of motion present. No mass and no thyromegaly present.  Cardiovascular: Normal rate, regular rhythm, S1 normal, S2 normal, normal heart sounds, intact distal pulses and normal pulses.  Exam reveals no gallop and no friction rub.   No murmur heard. Pulses:      Radial pulses are 2+ on the right side, and 2+ on the left side.       Dorsalis pedis pulses are 2+ on the right side, and 2+ on the left side.  Pulmonary/Chest: Effort normal and breath sounds normal. No respiratory distress. He has no wheezes. He has no rhonchi. He has no rales.  Abdominal: Soft. Normal appearance and bowel sounds are normal. He exhibits no distension, no ascites and no mass. There is no hepatosplenomegaly. There is tenderness. There is no rebound, no guarding and no CVA tenderness.  Right lower quadrant and suprapubic tenderness to palpation.  Musculoskeletal: Normal range of motion. He exhibits no edema and no tenderness.  Lymphadenopathy:    He has no cervical adenopathy.  Neurological: He is alert. He has normal strength. No cranial nerve deficit or sensory deficit. He exhibits normal muscle tone. GCS eye subscore is 4. GCS verbal subscore is 5. GCS motor subscore is 6.  Skin: Skin is warm, dry and intact. No petechiae and no rash noted. He is not diaphoretic. No erythema. No pallor.  Psychiatric: He has a normal mood and affect. His behavior is normal. Judgment normal.    ED Course  Procedures (including critical care time) Labs Review Labs Reviewed  COMPREHENSIVE METABOLIC PANEL - Abnormal; Notable for the following:    Glucose, Bld 104 (*)    BUN 38 (*)    Creatinine, Ser 1.39 (*)    GFR calc non Af Amer 46 (*)    GFR calc Af Amer 53 (*)    All other components within normal limits  URINALYSIS, ROUTINE W REFLEX MICROSCOPIC - Abnormal; Notable for the following:    APPearance CLOUDY (*)    Ketones, ur 15 (*)    Leukocytes, UA SMALL (*)    All other components within normal limits  URINE  MICROSCOPIC-ADD ON - Abnormal; Notable for the following:    Squamous Epithelial / LPF FEW (*)    Bacteria, UA FEW (*)    All other components within normal limits  URINE CULTURE  CBC WITH DIFFERENTIAL  LIPASE, BLOOD  I-STAT CG4 LACTIC ACID, ED    Imaging Review Ct Abdomen Pelvis W Contrast  01/13/2014   CLINICAL DATA:  Sick for the past 2 weeks with multiple episodes of emesis, diarrhea and generalized weakness/dizziness. No fever or chills. Initial encounter.  EXAM: CT ABDOMEN AND PELVIS WITH CONTRAST  TECHNIQUE: Multidetector CT imaging of the abdomen and pelvis was performed using the standard protocol following bolus administration of intravenous contrast.  CONTRAST:  164mL OMNIPAQUE IOHEXOL 300 MG/ML  SOLN  COMPARISON:  10/30/2013  FINDINGS: Normal hepatic contour. Subcentimeter hypo attenuating lesion within the dome of the right lobe of the liver (image 13, series 21) is too small to adequately characterize of favored to represent a hepatic cyst. Post cholecystectomy. No intra or extrahepatic biliary duct dilatation. No ascites.  There is symmetric enhancement and excretion of the bilateral kidneys. Known approximately 2.3 cm enhancing partially exophytic mass arising from the posterior aspect of the inferior pole of the right kidney (image 31, series 21) is grossly unchanged since remote abdominal CT performed 05/2010. Unchanged partially exophytic approximately 6.8 cm left-sided renal cyst as well as a partially exophytic approximately 2.8 cm right-sided renal cyst (image 24). No renal stones on this postcontrast examination. No urinary obstruction or perinephric stranding. Normal appearance of the bilateral adrenal glands, pancreas and spleen.  Moderate-sized hiatal hernia. Enteric contrast extends to the level of the splenic flexure of the colon. Scattered colonic diverticulosis without evidence of diverticulitis. Note is again made of several large duodenal diverticuli. The bowel is  otherwise normal in course and caliber without wall thickening or evidence of enteric obstruction. The appendix is not visualized, however there is no inflammatory change within the right lower abdominal quadrant. No pneumoperitoneum, pneumatosis or portal venous gas.  Normal caliber the abdominal aorta. The major branch vessels of the abdominal aorta appear widely patent on this non CTA examination. No retroperitoneal, mesenteric, pelvic or inguinal lymphadenopathy.  Prostatic calcifications. Normal appearance of the urinary bladder given degree distention. No free fluid in the pelvic cul-de-sac.  Limited visualization of the lower thorax demonstrates bibasilar heterogeneous opacities with apparent bronchial wall thickening within the bilateral lower lobe segmental and subsegmental bronchi. There is minimal subsegmental atelectasis within the imaged caudal segment of the right middle lobe.  Normal heart size. There is unchanged ectasia of the imaged caudal aspect of the ascending thoracic aorta measuring 4 cm in diameter (image 1, series 201).  Normal heart size. Trace amount of presumably physiologic pericardial fluid.  No acute or aggressive osseus abnormalities. Stigmata of dish within the lower thoracic spine.  Regional soft tissues appear normal.  IMPRESSION: 1. No explanation for patient's multiple episodes of emesis, diarrhea, generalized weakness and dizziness. Specifically, no evidence of enteric or urinary obstruction. 2. 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 Continued attention on follow-up is recommended. 3. Moderate-sized hiatal hernia. 4. Colonic diverticulosis without evidence of diverticulitis.   Electronically Signed   By: Sandi Mariscal M.D.   On: 01/13/2014 02:09     EKG Interpretation   Date/Time:  Monday  January 13 2014 00:09:10 EDT Ventricular Rate:  69 PR Interval:  156 QRS Duration: 96 QT Interval:  452 QTC Calculation: 484 R Axis:   -24 Text Interpretation:  Sinus rhythm Left axis deviation Baseline wander in  lead(s) V6 No significant change since last tracing Confirmed by Glynn Octave [phone]) on 01/13/2014 12:11:56 AM      MDM   Final diagnoses:  None    Patient is to emergency department for nausea vomiting and diarrhea. He does have some mild dehydration and given a liter of IV fluids. Also  has some abdominal tenderness on exam will be evaluated with a CT scan. Urinalysis is pending for infection.  Urinalysis reveals an infection. He was given Keflex in the emergency department. CT scan did not reveal any cause for patient's vomiting and diarrhea. He does have a sister that is unchanged in size, family is aware of this and he has been getting followup for it. Patient saw signs remain within normal limits and he is safe for discharge.   Everlene Balls, MD 01/13/14 [id number]

## 2014-01-13 NOTE — Telephone Encounter (Signed)
Spoke to patient's wife to notify her of Dr. Josefine Class comments. She verbalized understanding and stated she would pick up medication as soon as possible.

## 2014-01-13 NOTE — ED Notes (Signed)
MD at bedside. 

## 2014-01-13 NOTE — Telephone Encounter (Signed)
Patient Information:  Caller Name: [name]  Phone: [phone]  Patient: Derflinger, Hillel R  Gender: Male  DOB: 1930/12/11  Age: 80 Years  PCP: Elsie Stain Brigitte Pulse) [facility])  Office Follow Up:  Does the office need to follow up with this patient?: Yes  Instructions For The Office: Please call and advise   Symptoms  Reason For Call & Symptoms: Pt was seen at [facility] ED last night for vomiting that has been ongoing x 2-3 weeks. Pt was diagnosed as having a UTI. He was prescribed Keflex 500mg  po BID. Pt was given a dose at 02:45 this am. Pt got up at 10 am and has vomited 3-4x - small amounts only. Pt was given zofran x 1 in the ED but they did not give him a prescription to take at home.  Reviewed Health History In EMR: Yes  Reviewed Medications In EMR: Yes  Reviewed Allergies In EMR: Yes  Reviewed Surgeries / Procedures: Yes  Date of Onset of Symptoms: 12/23/2013  Guideline(s) Used:  Vomiting  Disposition Per Guideline:   Go to ED Now  Reason For Disposition Reached:   Moderate vomiting (e.g., 3 - 5 times/day) and age > 67  Advice Given:  N/A  RN Overrode Recommendation:  Follow Up With Office Later  Does the MD think he needs to return to ED for vomiting ? or could we prescribe some po Zofran to see if that would allow him to keep his Keflex down.

## 2014-01-13 NOTE — Discharge Instructions (Signed)
Urinary Tract Infection [name], you were seen today for vomiting. Your urine shows an infection. Take antibiotics as prescribed. Your CT scan results are below and show a large cyst on your kidney. Be sure to have this followed up within 6 months. Followup with your primary care physician within 3 days for continued treatment. If any of your symptoms worsen come back to the emergency department immediately. Thank you. A urinary tract infection (UTI) can occur any place along the urinary tract. The tract includes the kidneys, ureters, bladder, and urethra. A type of germ called bacteria often causes a UTI. UTIs are often helped with antibiotic medicine.  HOME CARE   If given, take antibiotics as told by your doctor. Finish them even if you start to feel better.  Drink enough fluids to keep your pee (urine) clear or pale yellow.  Avoid tea, drinks with caffeine, and bubbly (carbonated) drinks.  Pee often. Avoid holding your pee in for a long time.  Pee before and after having sex (intercourse).  Wipe from front to back after you poop (bowel movement) if you are a woman. Use each tissue only once. GET HELP RIGHT AWAY IF:   You have back pain.  You have lower belly (abdominal) pain.  You have chills.  You feel sick to your stomach (nauseous).  You throw up (vomit).  Your burning or discomfort with peeing does not go away.  You have a fever.  Your symptoms are not better in 3 days. MAKE SURE YOU:   Understand these instructions.  Will watch your condition.  Will get help right away if you are not doing well or get worse. Document Released: 09/07/2007 Document Revised: 12/14/2011 Document Reviewed: 10/20/2011 [facility] Patient Information 2015 [place], [place]. This information is not intended to replace advice given to you by your health care provider. Make sure you discuss any questions you have with your health care provider.

## 2014-01-13 NOTE — ED Notes (Signed)
Pt given a urinal for urine sample.

## 2014-01-13 NOTE — Telephone Encounter (Signed)
I am okay with trial of zofran in the meantime with return to ER if needed.  zofran sent. Thanks.

## 2014-01-13 NOTE — Telephone Encounter (Signed)
Thanks

## 2014-01-14 LAB — URINE CULTURE
Colony Count: NO GROWTH
Culture: NO GROWTH

## 2014-01-20 ENCOUNTER — Encounter: Payer: Self-pay | Admitting: Family Medicine

## 2014-01-20 ENCOUNTER — Ambulatory Visit (INDEPENDENT_AMBULATORY_CARE_PROVIDER_SITE_OTHER): Payer: Medicare HMO | Admitting: Family Medicine

## 2014-01-20 VITALS — BP 126/78 | HR 64 | Temp 97.5°F | Wt 163.5 lb

## 2014-01-20 DIAGNOSIS — Z87440 Personal history of urinary (tract) infections: Secondary | ICD-10-CM

## 2014-01-20 DIAGNOSIS — R42 Dizziness and giddiness: Secondary | ICD-10-CM

## 2014-01-20 LAB — POCT URINALYSIS DIPSTICK
BILIRUBIN UA: NEGATIVE
Blood, UA: NEGATIVE
Glucose, UA: NEGATIVE
LEUKOCYTES UA: NEGATIVE
NITRITE UA: NEGATIVE
Spec Grav, UA: >=1.03
Urobilinogen, UA: 0.2
pH, UA: 5.5

## 2014-01-20 NOTE — Patient Instructions (Addendum)
Stop the antibiotics, try to drink a little more fluid and get some rest.  If you have more rooming spinning/dizziness, then use the OTC meclizine.  I think you'll feel better in a few days.

## 2014-01-20 NOTE — Progress Notes (Signed)
Pre visit review using our clinic review tool, if applicable. No additional management support is needed unless otherwise documented below in the visit note.  Seen at ER with GI complaints.  He had been sick the week before ER eval.  His sx were waxing and waning, then got worse to the point of going to ER.  CT abd w/o acute changes.  Abnormal u/a noted, treated for presumed UTI but ucx neg, resulted later and d/w pt and wife today. In the meantime, his vomiting is resolved.  Wife stated that he "thought the antibiotics were affecting me."  Vomited once since leaving the ER, not in the last few days.  No diarrhea.  "I feel lousy" but better than at presentation to ER.  No fevers.  No known burning with urination.    He complains of constipation now.  Still passing gas.  He is done with prev abx per ER.  He complains of vertigo, getting some better now.  It would prev happen with head turning.  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    His wife gave him motion sickness meds with this episode initially, with some temporary help.  Not used in the last few days.  Appetite is better in the last two days.    Meds, vitals, and allergies reviewed.   ROS: See HPI.  Otherwise, noncontributory.  nad ncat Mmm, OP wnl Neck supple.  No LA rrr ctab Abd soft, not ttp, no rebound Ext w/o edema

## 2014-01-22 NOTE — Assessment & Plan Note (Addendum)
He could have had a UTI, with the ucx neg.  D/w pt and wife.   He likely did have some vertigo.   All of this was likely compounded by relative dehydration.  At this point, he should be off abx, use OTC meclizine or similar if needed.  Rest and fluids, should gradually improve.   He agrees.  Call back as needed.   Nontoxic appearing today and doesn't appear dehydrated.   Patient and wife agree.  >25 minutes spent in face to face time with patient, >50% spent in counselling or coordination of care.

## 2014-02-03 ENCOUNTER — Telehealth: Payer: Self-pay | Admitting: Family Medicine

## 2014-02-03 MED ORDER — DONEPEZIL HCL 10 MG PO TABS: 10.0000 mg | ORAL_TABLET | Freq: Every day | ORAL | Status: DC

## 2014-02-03 NOTE — Telephone Encounter (Signed)
That is a reasonable thought, I would stop it for now and have them update [place] in a few days.  It will likely take a few days for the medicine to get out of his system. I left it on the med list for now. Thanks.

## 2014-02-03 NOTE — Telephone Encounter (Signed)
Patient's wife notified as instructed by telephone. Ms. Vandermeer stated that she will call back in a few days with an update.

## 2014-02-03 NOTE — Telephone Encounter (Signed)
Thanks

## 2014-02-03 NOTE — Telephone Encounter (Signed)
Patient Information:  Caller Name: [name]  Phone: [phone]  Patient: [name], [name]  Gender: Male  DOB: Nov 16, 1930  Age: 80 Years  PCP: Elsie Stain Brigitte Pulse) [facility])  Office Follow Up:  Does the office need to follow up with this patient?: Yes  Instructions For The Office: Wife has questions regarding Aricept for [name].  Please follow up .   Symptoms  Reason For Call & Symptoms: Pt has been having occassional nausea/vomting, dizziness x 2 months.   Pt  has been seen in office and in ER and no findings.  Taking motion sickness medication and/or Zofran for sxs prn.  Pt's last episode of vomiting was on 02/01/14, but continued to not feel well on 02/02/14.     02/03/14 pt is still asleep this morning, so unable to triage.   Wife is concerned because sxs began around the same time he started taking Aricept and she would like to know if it's possible that the Aricept Dehnert be causing sxs and if it's ok to stop it ?   Advised wife a message would be sent to [name] regarding her questions and for her to call back if pt wakes up with any sxs of change in condition, she agrees.  Reviewed Health History In EMR: Yes  Reviewed Medications In EMR: Yes  Reviewed Allergies In EMR: Yes  Reviewed Surgeries / Procedures: Yes  Date of Onset of Symptoms: 12/03/2013  Guideline(s) Used:  No Protocol Available - Sick Adult  Disposition Per Guideline:   Discuss with PCP and Callback by Nurse Today  Reason For Disposition Reached:   Nursing judgment  Advice Given:  N/A  Patient Will Follow Care Advice:  YES

## 2014-03-25 ENCOUNTER — Other Ambulatory Visit: Payer: Self-pay | Admitting: Family Medicine

## 2014-03-26 NOTE — Telephone Encounter (Signed)
Received refill request electronically from pharmacy. Refill request does not match medication list. Is it okay to refill medication?

## 2014-03-27 NOTE — Telephone Encounter (Signed)
Sent. Dose was prev adjusted.   Please schedule f/u so I can check his memory.  43min would be useful.  Thanks.

## 2014-03-31 NOTE — Telephone Encounter (Signed)
Left detailed message on voicemail.  

## 2014-04-22 ENCOUNTER — Ambulatory Visit (INDEPENDENT_AMBULATORY_CARE_PROVIDER_SITE_OTHER): Payer: Commercial Managed Care - HMO | Admitting: Family Medicine

## 2014-04-22 ENCOUNTER — Encounter: Payer: Self-pay | Admitting: Family Medicine

## 2014-04-22 VITALS — BP 114/76 | HR 98 | Temp 97.4°F | Wt 153.5 lb

## 2014-04-22 DIAGNOSIS — R413 Other amnesia: Secondary | ICD-10-CM

## 2014-04-22 MED ORDER — ESCITALOPRAM OXALATE 10 MG PO TABS: ORAL_TABLET | ORAL | Status: DC

## 2014-04-22 NOTE — Patient Instructions (Signed)
Art on lexapro 1/2 tab a day for 2 weeks, then increase up to 1 tab a day.  Update me in about 2-3 weeks, sooner if needed.  Take care.  Glad to see you.

## 2014-04-22 NOTE — Progress Notes (Signed)
Pre visit review using our clinic review tool, if applicable. No additional management support is needed unless otherwise documented below in the visit note.  He had GI upset with the start of aricept.  Getting off the medicine helped with vomiting overall.  He had a few episodes in the meantime, but improved overall.  He had attributed recently events in the meantime to overeating.  He had prev memory loss with CT noted and had been started on aricept back in 2015.    He is getting "pretty slow" per wife, using his cane occ.  More sitting in the recliner, when awake.  Mood has been down, slightly worse per wife's observation.  He is frustrated about not having as much independence and as high a functional status as prev.    Unclear if pseudodementia, ie dec mood---> deconditioning and memory loss vs memory loss --> depressive sx.  dw pt and wife.   Wife still notes memory changes, not worse, but not better.  He has had trouble remembering names of people.   He's had some mood swings with irritability reverting to his normal kind disposition.  No hallucinations.    Meds, vitals, and allergies reviewed.   ROS: See HPI.  Otherwise, noncontributory.  nad ncat Mmm Neck supple, no LA rrr ctab abd soft, not ttp Ext w/o edema Speech wnl but 1/3 on recall of objects, can't read a clock face and doesn't know the year.  Does know month and day of week.   Can still do basic math.

## 2014-04-23 NOTE — Assessment & Plan Note (Signed)
Didn't tolerate aricept.  The possibility of pseudodementia remains.  D/w pt and wife.  Would try low dose of lexapro for now and they'll update me.  Routine timeline for effect d/w them.  The other options would be trial of namenda vs neuro referral, both offered.  Wife started that patient had appeared depressed and others had noted it, too.  Thus, will start SSRI.  Will not use remeron due to prev inc in sleep noted.  Still okay for outpatient f/u.  >25 minutes spent in face to face time with patient, >50% spent in counselling or coordination of care.

## 2014-05-09 ENCOUNTER — Encounter: Payer: Self-pay | Admitting: Family Medicine

## 2014-05-09 ENCOUNTER — Ambulatory Visit (INDEPENDENT_AMBULATORY_CARE_PROVIDER_SITE_OTHER): Payer: Commercial Managed Care - HMO | Admitting: Family Medicine

## 2014-05-09 ENCOUNTER — Telehealth: Payer: Self-pay | Admitting: Family Medicine

## 2014-05-09 VITALS — BP 122/72 | HR 75 | Temp 97.2°F | Wt 149.8 lb

## 2014-05-09 DIAGNOSIS — R1111 Vomiting without nausea: Secondary | ICD-10-CM | POA: Diagnosis not present

## 2014-05-09 DIAGNOSIS — R112 Nausea with vomiting, unspecified: Secondary | ICD-10-CM | POA: Diagnosis not present

## 2014-05-09 LAB — CBC WITH DIFFERENTIAL/PLATELET
BASOS ABS: 0 10*3/uL (ref 0.0–0.1)
Basophils Relative: 0 % (ref 0–1)
Eosinophils Absolute: 0.1 10*3/uL (ref 0.0–0.7)
Eosinophils Relative: 1 % (ref 0–5)
HCT: 40.5 % (ref 39.0–52.0)
Hemoglobin: 13.6 g/dL (ref 13.0–17.0)
Lymphocytes Relative: 20 % (ref 12–46)
Lymphs Abs: 1.4 10*3/uL (ref 0.7–4.0)
MCH: 27 pg (ref 26.0–34.0)
MCHC: 33.6 g/dL (ref 30.0–36.0)
MCV: 80.5 fL (ref 78.0–100.0)
MONO ABS: 0.6 10*3/uL (ref 0.1–1.0)
MPV: 10.5 fL (ref 8.6–12.4)
Monocytes Relative: 8 % (ref 3–12)
NEUTROS ABS: 5.1 10*3/uL (ref 1.7–7.7)
Neutrophils Relative %: 71 % (ref 43–77)
Platelets: 265 10*3/uL (ref 150–400)
RBC: 5.03 MIL/uL (ref 4.22–5.81)
RDW: 14.7 % (ref 11.5–15.5)
WBC: 7.2 10*3/uL (ref 4.0–10.5)

## 2014-05-09 MED ORDER — ONDANSETRON HCL 4 MG PO TABS: 4.0000 mg | ORAL_TABLET | Freq: Three times a day (TID) | ORAL | Status: DC | PRN

## 2014-05-09 MED ORDER — ESCITALOPRAM OXALATE 10 MG PO TABS: 5.0000 mg | ORAL_TABLET | Freq: Every day | ORAL | Status: DC

## 2014-05-09 NOTE — Patient Instructions (Signed)
Go to the lab on the way out.  We'll contact you with your lab report. Cut the lexapro back to 5mg  a day (1/2 tab).  Take sips of fluids and use zofran if needed for nausea.  Take care.  Update me Monday.  Glad to see you.

## 2014-05-09 NOTE — Progress Notes (Signed)
Pre visit review using our clinic review tool, if applicable. No additional management support is needed unless otherwise documented below in the visit note.  Recently started on lexapro.  Was on 5mg  initially, then up to 10mg  a day recently.  He has been "dizzy and swimmy headed" yesterday, before standing.  Has been vomiting mult times recently.  Has vomited 3 times this week, possibly some before this week.   He has tremor at baseline, sometimes worse than others.   He attributed the vomiting to eating too much, but it didn't happen before the lexapro was added.    No fevers.  No diarrhea.  No abd pain.  No blood in stool.  Recently with some constipation.  No one else with vomiting.  No atypical foods.   Meds, vitals, and allergies reviewed.   ROS: See HPI.  Otherwise, noncontributory.  GEN: nad, alert and pleasant HEENT: mucous membranes moist NECK: supple w/o LA CV: rrr. PULM: ctab, no inc wob ABD: soft, +bs, not ttp, no rebound EXT: trace edema

## 2014-05-09 NOTE — Telephone Encounter (Signed)
[facility] Patient Name: [name] DOB: [birth year] Initial Comment Caller states husband saw MD Damita Dunnings 2-3 wks ago. Vomited 3 times this week, has been going on off and on since October, has questions. Nurse Assessment Nurse: Markus Daft, RN, Sherre Poot Date/Time (Eastern Time): 05/09/2014 9:44:35 AM Confirm and document reason for call. If symptomatic, describe symptoms. ---Caller states that pt has vomited 3 times after dinner each time this week, has been going on off and on since October. Emesis has appearance of undigested food. Felt better after the episode. No vomiting or fever today. -- Caller states husband saw Dr. Damita Dunnings 2-3 wks ago as a f/u on Dementia and several other things. He had been on a dementia medication thinking this was causing the N/V. Started on antidepressant at that visit instead. Has the patient traveled out of the country within the last 30 days? ---No Does the patient require triage? ---Yes Related visit to physician within the last 2 weeks? ---Yes Does the PT have any chronic conditions? (i.e. diabetes, asthma, etc.) ---Yes List chronic conditions. ---Dementia, Depression, HTN, Hypothyroid, Acid reflux (has had GI doctor in past) Guidelines Guideline Title Affirmed Question Affirmed Notes Vomiting [1] MILD or MODERATE vomiting AND [2] present > 48 hours (2 days) (Exception: mild vomiting with associated diarrhea) Final Disposition User See Physician within [facility], RN, Sherre Poot Comments Caller has appt set up already today at 3 pm with Dr. Damita Dunnings.

## 2014-05-09 NOTE — Telephone Encounter (Signed)
Pt has appt scheduled with Dr Duncan today at 3PM 

## 2014-05-10 LAB — COMPREHENSIVE METABOLIC PANEL
ALBUMIN: 4 g/dL (ref 3.5–5.2)
ALK PHOS: 65 U/L (ref 39–117)
ALT: 12 U/L (ref 0–53)
AST: 16 U/L (ref 0–37)
BUN: 25 mg/dL — AB (ref 6–23)
CHLORIDE: 102 meq/L (ref 96–112)
CO2: 27 meq/L (ref 19–32)
Calcium: 9.1 mg/dL (ref 8.4–10.5)
Creat: 1.14 mg/dL (ref 0.50–1.35)
Glucose, Bld: 102 mg/dL — ABNORMAL HIGH (ref 70–99)
Potassium: 4.2 mEq/L (ref 3.5–5.3)
Sodium: 142 mEq/L (ref 135–145)
Total Bilirubin: 0.8 mg/dL (ref 0.2–1.2)
Total Protein: 6.9 g/dL (ref 6.0–8.3)

## 2014-05-10 LAB — LIPASE: LIPASE: 10 U/L (ref 0–75)

## 2014-05-11 DIAGNOSIS — R1110 Vomiting, unspecified: Secondary | ICD-10-CM | POA: Insufficient documentation

## 2014-05-11 NOTE — Assessment & Plan Note (Signed)
Unclear if related to SSRI, possible.  Cut the lexapro back to 5mg  a day (1/2 tab).  Will take sips of fluids and use zofran if needed for nausea.  Will update me Monday, see notes on labs.   Patient and wife agree.

## 2014-06-23 ENCOUNTER — Telehealth: Payer: Self-pay | Admitting: Family Medicine

## 2014-06-23 NOTE — Telephone Encounter (Signed)
[facility] Patient Name: IZZAC Breon DOB: [birth year] Initial Comment Caller states husband has been vomiting on/off. Nurse Assessment Nurse: Donalynn Furlong, RN, Myna Hidalgo Date/Time Eilene Ghazi Time): 06/23/2014 1:50:02 PM Confirm and document reason for call. If symptomatic, describe symptoms. ---Caller states husband has been vomiting on/off. Has been going on for 6 months, and have seen [name] Has the patient traveled out of the country within the last 30 days? ---No Does the patient require triage? ---Yes Related visit to physician within the last 2 weeks? ---No Does the PT have any chronic conditions? (i.e. diabetes, asthma, etc.) ---Yes List chronic conditions. ---high blood pressure, hypothyroidism Guidelines Guideline Title Affirmed Question Affirmed Notes Vomiting Vomiting is a chronic symptom (recurrent or ongoing AND present > 4 weeks) Final Disposition User See PCP within [facility], RN, Myna Hidalgo Comments pt called back with appt time w [name] on 3/24 at 3 pm

## 2014-06-23 NOTE — Telephone Encounter (Signed)
Pt has appt with [name] on 06/26/14.

## 2014-06-24 NOTE — Telephone Encounter (Signed)
Will see at [facility].  Thanks.  

## 2014-06-26 ENCOUNTER — Ambulatory Visit (INDEPENDENT_AMBULATORY_CARE_PROVIDER_SITE_OTHER): Payer: Commercial Managed Care - HMO | Admitting: Family Medicine

## 2014-06-26 ENCOUNTER — Encounter: Payer: Self-pay | Admitting: Family Medicine

## 2014-06-26 VITALS — BP 142/80 | HR 107 | Temp 97.5°F | Resp 18 | Wt 155.8 lb

## 2014-06-26 DIAGNOSIS — R112 Nausea with vomiting, unspecified: Secondary | ICD-10-CM

## 2014-06-26 NOTE — Patient Instructions (Signed)
Vaughan Basta will call about your referral. Go see her on the way out.  Take care.  Glad to see you.

## 2014-06-26 NOTE — Progress Notes (Signed)
He cut back on the lexapro, then eventually stopped the medicine.  In the meantime, he's still had variable episode of quick onset vomiting.  The change in lexapro didn't help much, didn't have an improvement off the medicine.  His mood/memory wasn't better on lexapro.  He continues to have 1-2 episodes of vomiting per week, occ less.  No blood in vomit.  No blood in stools.  GB prev removed.  No clear trigger foods identified.    Meds, vitals, and allergies reviewed.   ROS: See HPI.  Otherwise, noncontributory.  nad ncat Mmm Neck supple, no LA rrr ctab Benign abd, soft, not ttp, no rebound, normal BS Ext w/o edema

## 2014-06-30 NOTE — Assessment & Plan Note (Signed)
Prev CT with: 1. No explanation for patient's multiple episodes of emesis, diarrhea, generalized weakness and dizziness. Specifically, no evidence of enteric or urinary obstruction. 2. 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 Continued attention on follow-up is recommended. 3. Moderate-sized hiatal hernia. 4. Colonic diverticulosis without evidence of diverticulitis.  No clear med cause/trigger and his exam is still benign.  D/w pt and wife.  I would like GI input.  Will refer.  They agree.

## 2014-07-08 ENCOUNTER — Other Ambulatory Visit: Payer: Self-pay | Admitting: Gastroenterology

## 2014-08-11 ENCOUNTER — Other Ambulatory Visit: Payer: Self-pay | Admitting: *Deleted

## 2014-08-11 MED ORDER — TERAZOSIN HCL 5 MG PO CAPS: 5.0000 mg | ORAL_CAPSULE | Freq: Every day | ORAL | Status: DC

## 2014-09-26 ENCOUNTER — Other Ambulatory Visit: Payer: Self-pay | Admitting: Family Medicine

## 2014-10-28 ENCOUNTER — Encounter: Payer: Self-pay | Admitting: Family Medicine

## 2014-10-28 ENCOUNTER — Ambulatory Visit (INDEPENDENT_AMBULATORY_CARE_PROVIDER_SITE_OTHER): Payer: Commercial Managed Care - HMO | Admitting: Family Medicine

## 2014-10-28 VITALS — BP 110/76 | HR 96 | Temp 97.6°F | Wt 159.2 lb

## 2014-10-28 DIAGNOSIS — R1111 Vomiting without nausea: Secondary | ICD-10-CM

## 2014-10-28 NOTE — Patient Instructions (Signed)
Let me talk to GI and we'll be in touch.  Don't change your meds for now.

## 2014-10-28 NOTE — Progress Notes (Signed)
Pre visit review using our clinic review tool, if applicable. No additional management support is needed unless otherwise documented below in the visit note.  Sunday night went to a cookout at the park.  Was in the shade, with a fan nearby.  Had a hot dog and a few chips, some baked beans.  Later on was vomiting, not long after.  Vomited several times.  Was able to lay down and then felt better.  Wife ate the same food and didn't get sick.  A few weeks ago his wife cooked sausage and potatoes on the grill, along with squash and vegetables.  He got sick the same way.  He'll have a few episodes like this in a month.   No blood in stool, no blood in vomit.  Back to baseline now. He isn't overeating.   Meds, vitals, and allergies reviewed.   ROS: See HPI.  Otherwise, noncontributory.  nad ncat Mmm rrr ctab Abd soft, not ttp Ext w/o edema.

## 2014-10-29 NOTE — Assessment & Plan Note (Signed)
This doesn't appear to be med related.   He'll have a few episodes like this in a month.   No blood in stool, no blood in vomit.   Back to baseline now. He isn't overeating.  I'll ask for GI input- Dr. Cristina Gong.  I don't know if gastric emptying study would be reasonable.

## 2014-11-02 ENCOUNTER — Telehealth: Payer: Self-pay | Admitting: Family Medicine

## 2014-11-02 NOTE — Telephone Encounter (Signed)
Notify pt.  I checked with Dr. Cristina Gong and his clinic should be calling (if not already done) about follow up.  I wouldn't do any extra testing before that appointment.  Thanks.

## 2014-11-03 NOTE — Telephone Encounter (Signed)
Left message on patient's voicemail to return call

## 2014-11-03 NOTE — Telephone Encounter (Signed)
Pt wife called back. Informed her of Dr. Carole Civil comments and she verbalized understanding.  Pt has appt to see Dr. Cristina Gong 11/28/14.

## 2014-11-03 NOTE — Telephone Encounter (Signed)
Thanks

## 2014-11-28 ENCOUNTER — Emergency Department (HOSPITAL_COMMUNITY)
Admission: EM | Admit: 2014-11-28 | Discharge: 2014-11-29 | Disposition: A | Discharge status: Home / Self Care | Payer: Commercial Managed Care - HMO | Attending: Emergency Medicine | Admitting: Emergency Medicine

## 2014-11-28 ENCOUNTER — Encounter (HOSPITAL_COMMUNITY): Payer: Self-pay | Admitting: Emergency Medicine

## 2014-11-28 DIAGNOSIS — Z23 Encounter for immunization: Secondary | ICD-10-CM | POA: Insufficient documentation

## 2014-11-28 DIAGNOSIS — Z79899 Other long term (current) drug therapy: Secondary | ICD-10-CM | POA: Diagnosis not present

## 2014-11-28 DIAGNOSIS — W01198A Fall on same level from slipping, tripping and stumbling with subsequent striking against other object, initial encounter: Secondary | ICD-10-CM | POA: Diagnosis not present

## 2014-11-28 DIAGNOSIS — Y9301 Activity, walking, marching and hiking: Secondary | ICD-10-CM | POA: Insufficient documentation

## 2014-11-28 DIAGNOSIS — Z8619 Personal history of other infectious and parasitic diseases: Secondary | ICD-10-CM | POA: Insufficient documentation

## 2014-11-28 DIAGNOSIS — Y92009 Unspecified place in unspecified non-institutional (private) residence as the place of occurrence of the external cause: Secondary | ICD-10-CM

## 2014-11-28 DIAGNOSIS — S0990XA Unspecified injury of head, initial encounter: Secondary | ICD-10-CM | POA: Diagnosis not present

## 2014-11-28 DIAGNOSIS — S8002XA Contusion of left knee, initial encounter: Secondary | ICD-10-CM | POA: Insufficient documentation

## 2014-11-28 DIAGNOSIS — F0390 Unspecified dementia without behavioral disturbance: Secondary | ICD-10-CM | POA: Insufficient documentation

## 2014-11-28 DIAGNOSIS — E039 Hypothyroidism, unspecified: Secondary | ICD-10-CM | POA: Insufficient documentation

## 2014-11-28 DIAGNOSIS — I10 Essential (primary) hypertension: Secondary | ICD-10-CM | POA: Insufficient documentation

## 2014-11-28 DIAGNOSIS — Y998 Other external cause status: Secondary | ICD-10-CM | POA: Diagnosis not present

## 2014-11-28 DIAGNOSIS — Z7982 Long term (current) use of aspirin: Secondary | ICD-10-CM | POA: Diagnosis not present

## 2014-11-28 DIAGNOSIS — S01112A Laceration without foreign body of left eyelid and periocular area, initial encounter: Secondary | ICD-10-CM | POA: Diagnosis not present

## 2014-11-28 DIAGNOSIS — S0181XA Laceration without foreign body of other part of head, initial encounter: Secondary | ICD-10-CM

## 2014-11-28 DIAGNOSIS — Y9289 Other specified places as the place of occurrence of the external cause: Secondary | ICD-10-CM | POA: Diagnosis not present

## 2014-11-28 DIAGNOSIS — W19XXXA Unspecified fall, initial encounter: Secondary | ICD-10-CM

## 2014-11-28 NOTE — ED Notes (Signed)
Patient here with complaint of "i'm just an old man". Was walking tonight, lost his balance (possibly tripped over shoes), and fell. Was wearing eye glasses at time of injury which is probable cause of injury; laceration noted to brow of left eye. Bleeding controlled currently. Family reports that patient was also complaining of left leg pain after incident but was able to ambulate to vehicle with cane as usual.

## 2014-11-29 ENCOUNTER — Emergency Department (HOSPITAL_COMMUNITY): Payer: Commercial Managed Care - HMO

## 2014-11-29 MED ORDER — TETANUS-DIPHTH-ACELL PERTUSSIS 5-2.5-18.5 LF-MCG/0.5 IM SUSP
0.5000 mL | Freq: Once | INTRAMUSCULAR | Status: AC
  Administered 2014-11-29: 0.5 mL via INTRAMUSCULAR
  Filled 2014-11-29: qty 0.5

## 2014-11-29 MED ORDER — LIDOCAINE-EPINEPHRINE (PF) 2 %-1:200000 IJ SOLN
10.0000 mL | Freq: Once | INTRAMUSCULAR | Status: AC
  Administered 2014-11-29: 10 mL
  Filled 2014-11-29: qty 20

## 2014-11-29 NOTE — ED Provider Notes (Signed)
CSN: [id number]     Arrival date & time 11/28/14  2045 History   First MD Initiated Contact with Patient 11/29/14 0007     Chief Complaint  Patient presents with  . Fall  . Head Injury     (Consider location/radiation/quality/duration/timing/severity/associated sxs/prior Treatment) Patient is a 81 y.o. male presenting with fall and head injury. The history is provided by the patient.  Fall  Head Injury He tripped and fell at home, suffering a laceration over his left eye. He also bruised his left knee. There was no loss of consciousness. No nausea or vomiting. He does not know when his last tetanus immunization was.  Past Medical History  Diagnosis Date  . Hypertension   . Hypothyroidism   . Diverticulosis of colon   . BPH (benign prostatic hypertrophy)   . Pancreatitis 2011    and gallstone pancreatitis and Ecoli bacteremia  . 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 followed by [name]- observation as of 09/2010  . Rabbit fever 1940  . History of shingles   . Dementia    Past Surgical History  Procedure Laterality Date  . Appendectomy  1959  . Thyroidectomy, partial  1966  . Knee arthroscopy  06/1995    left Dr Derrel Nip  . Joint replacement  04/30/2001    bilateral TKR  . Cholecystectomy  10/2009  . Eye muscle surgery      12/12 had double vision Dr. Juleen China at [facility] History  Problem Relation Age of Onset  . Hypertension Mother   . Stroke Mother   . Alcohol abuse Father   . Cancer Brother     prostate CA  . Heart disease Brother    Social History  Substance Use Topics  . Smoking status: Never Smoker   . Smokeless tobacco: Never Used  . Alcohol Use: No    Review of Systems  All other systems reviewed and are negative.     Allergies  Aricept; Sulfonamide derivatives; and Valtrex  Home Medications   Prior to Admission medications   Medication Sig Start Date End Date Taking? Authorizing Provider  aspirin EC 325 MG EC tablet  Take 1 tablet (325 mg total) by mouth daily. 07/13/12  Yes Ripudeep Krystal Eaton, MD  B Complex-C (B-COMPLEX WITH VITAMIN C) tablet Take 1 tablet by mouth daily. 07/13/12  Yes Ripudeep Krystal Eaton, MD  levothyroxine (SYNTHROID, LEVOTHROID) 100 MCG tablet TAKE 1 TABLET EVERY DAY 09/26/14  Yes Tonia Ghent, MD  Multiple Vitamin (MULTIVITAMIN) tablet Take 1 tablet by mouth daily.     Yes Historical Provider, MD  omeprazole (PRILOSEC) 20 MG capsule Take 20 mg by mouth every other day.    Yes Historical Provider, MD  terazosin (HYTRIN) 5 MG capsule Take 1 capsule (5 mg total) by mouth at bedtime. 08/11/14  Yes Tonia Ghent, MD   BP 158/102 mmHg  Pulse 72  Temp(Src) 98.4 F (36.9 C) (Oral)  Resp 18  SpO2 96% Physical Exam  Nursing note and vitals reviewed.  81 year old male, resting comfortably and in no acute distress. Vital signs are significant for hypertension. Oxygen saturation is 96%, which is normal. Head is normocephalic and atraumatic. PERRLA, EOMI. Oropharynx is clear. Neck is nontender and supple without adenopathy or JVD. Back is nontender and there is no CVA tenderness. Lungs are clear without rales, wheezes, or rhonchi. Chest is nontender. Heart has regular rate and rhythm without murmur. Abdomen is  soft, flat, nontender without masses or hepatosplenomegaly and peristalsis is normoactive. Extremities have no cyanosis or edema, full range of motion is present. Minor ecchymosis noted anterior aspect of the left knee. No swelling or effusion. Skin is warm and dry without rash. Neurologic: Mental status is normal, cranial nerves are intact, there are no motor or sensory deficits.  ED Course  Procedures (including critical care time) LACERATION REPAIR Performed by: [email or web address] Authorized by: LEXNT,[email or web address] Consent: Verbal consent obtained. Risks and benefits: risks, benefits and alternatives were discussed Consent given by: patient Patient identity confirmed: provided demographic  data Prepped and Draped in normal sterile fashion Wound explored  Laceration Location: left eyebrow  Laceration Length: 5.0 cm  No Foreign Bodies seen or palpated  Anesthesia: local infiltration  Local anesthetic: lidocaine 2% with epinephrine  Anesthetic total: 3 ml  Amount of cleaning: standard  Skin closure: close  Number of sutures: 11  Technique: simple interrupted with 5-0 nylon  Patient tolerance: Patient tolerated the procedure well with no immediate complications.  Imaging Review Ct Head Wo Contrast  11/29/2014   CLINICAL DATA:  Tripped over shoes and fell. Laceration to left forehead.  EXAM: CT HEAD WITHOUT CONTRAST  CT CERVICAL SPINE WITHOUT CONTRAST  TECHNIQUE: Multidetector CT imaging of the head and cervical spine was performed following the standard protocol without intravenous contrast. Multiplanar CT image reconstructions of the cervical spine were also generated.  COMPARISON:  Head CT 10/02/2013  FINDINGS: CT HEAD FINDINGS  Atrophy and chronic small vessel ischemic change, stable in appearance from prior. Remote lacunar infarcts in bilateral basal ganglia and left thalamus, unchanged. No intracranial hemorrhage, mass effect, or midline shift. No hydrocephalus. The basilar cisterns are patent. No evidence of territorial infarct. No intracranial fluid collection. Left supraorbital soft tissue edema. Calvarium is intact. Mucosal thickening involving left side of sphenoid sinus right greater than left maxillary sinus. Mastoid air cells are well aerated.  CT CERVICAL SPINE FINDINGS  There is no fracture. The dens is intact. There are no jumped or perched facets. Multilevel degenerative change with disc space narrowing from C4-C5 through C7-T1 and associated endplate spurs. Scattered facet arthropathy. No prevertebral soft tissue edema.  IMPRESSION: 1. Advanced but stable chronic small vessel ischemic change and atrophy without acute intracranial abnormality. 2. Chronic  sinusitis.  Mild left supraorbital soft tissue edema. 3. Degenerative change in the cervical spine without acute fracture.   Electronically Signed   By: Jeb Levering M.D.   On: 11/29/2014 01:41   Ct Cervical Spine Wo Contrast  11/29/2014   CLINICAL DATA:  Tripped over shoes and fell. Laceration to left forehead.  EXAM: CT HEAD WITHOUT CONTRAST  CT CERVICAL SPINE WITHOUT CONTRAST  TECHNIQUE: Multidetector CT imaging of the head and cervical spine was performed following the standard protocol without intravenous contrast. Multiplanar CT image reconstructions of the cervical spine were also generated.  COMPARISON:  Head CT 10/02/2013  FINDINGS: CT HEAD FINDINGS  Atrophy and chronic small vessel ischemic change, stable in appearance from prior. Remote lacunar infarcts in bilateral basal ganglia and left thalamus, unchanged. No intracranial hemorrhage, mass effect, or midline shift. No hydrocephalus. The basilar cisterns are patent. No evidence of territorial infarct. No intracranial fluid collection. Left supraorbital soft tissue edema. Calvarium is intact. Mucosal thickening involving left side of sphenoid sinus right greater than left maxillary sinus. Mastoid air cells are well aerated.  CT CERVICAL SPINE FINDINGS  There is no fracture. The dens is intact. There are no jumped or perched facets.  Multilevel degenerative change with disc space narrowing from C4-C5 through C7-T1 and associated endplate spurs. Scattered facet arthropathy. No prevertebral soft tissue edema.  IMPRESSION: 1. Advanced but stable chronic small vessel ischemic change and atrophy without acute intracranial abnormality. 2. Chronic sinusitis.  Mild left supraorbital soft tissue edema. 3. Degenerative change in the cervical spine without acute fracture.   Electronically Signed   By: Jeb Levering M.D.   On: 11/29/2014 01:41   Dg Knee Complete 4 Views Left  11/29/2014   CLINICAL DATA:  Trip and fall injury. Left leg pain. Ambulating as  per usual.  EXAM: LEFT KNEE - COMPLETE 4+ VIEW  COMPARISON:  None.  FINDINGS: Postoperative changes with left total knee arthroplasty and Patel femoral component. Components appear well seated. Mildly oblique positioning of the lateral limits evaluation. No evidence of acute fracture or dislocation. No focal bone lesion or bone destruction. No significant effusion.  IMPRESSION: Left total knee arthroplasty. Components appear well seated. No acute bony abnormality suggested.   Electronically Signed   By: Lucienne Capers M.D.   On: 11/29/2014 01:28   I have personally reviewed and evaluated these images as part of my medical decision-making.    MDM   Final diagnoses:  Fall at home, initial encounter  Laceration of forehead, initial encounter  Contusion of left knee, initial encounter    Fall with laceration of forehead, closed with sutures. CT of head and cervical spine are ordered, as well as x-rays of the left knee. TDaP booster is given.  CTs and x-rays are unremarkable. He is discharged with instructions of sutures removed in 5 days.  Delora Fuel, MD 09/81/19 [id number]

## 2014-11-29 NOTE — Discharge Instructions (Signed)
Stitches need to be removed in five days. That can be done here, at your doctor's office, or at an urgent care center.  Laceration Care, Adult A laceration is a cut or lesion that goes through all layers of the skin and into the tissue just beneath the skin. TREATMENT  Some lacerations Kinsel not require closure. Some lacerations Cerami not be able to be closed due to an increased risk of infection. It is important to see your caregiver as soon as possible after an injury to minimize the risk of infection and maximize the opportunity for successful closure. If closure is appropriate, pain medicines Kolinski be given, if needed. The wound will be cleaned to help prevent infection. Your caregiver will use stitches (sutures), staples, wound glue (adhesive), or skin adhesive strips to repair the laceration. These tools bring the skin edges together to allow for faster healing and a better cosmetic outcome. However, all wounds will heal with a scar. Once the wound has healed, scarring can be minimized by covering the wound with sunscreen during the day for 1 full year. HOME CARE INSTRUCTIONS  For sutures or staples:  Keep the wound clean and dry.  If you were given a bandage (dressing), you should change it at least once a day. Also, change the dressing if it becomes wet or dirty, or as directed by your caregiver.  Wash the wound with soap and water 2 times a day. Rinse the wound off with water to remove all soap. Pat the wound dry with a clean towel.  After cleaning, apply a thin layer of the antibiotic ointment as recommended by your caregiver. This will help prevent infection and keep the dressing from sticking.  You Cockerill shower as usual after the first 24 hours. Do not soak the wound in water until the sutures are removed.  Only take over-the-counter or prescription medicines for pain, discomfort, or fever as directed by your caregiver.  Get your sutures or staples removed as directed by your  caregiver. For skin adhesive strips:  Keep the wound clean and dry.  Do not get the skin adhesive strips wet. You Moen bathe carefully, using caution to keep the wound dry.  If the wound gets wet, pat it dry with a clean towel.  Skin adhesive strips will fall off on their own. You Blyden trim the strips as the wound heals. Do not remove skin adhesive strips that are still stuck to the wound. They will fall off in time. For wound adhesive:  You Knobel briefly wet your wound in the shower or bath. Do not soak or scrub the wound. Do not swim. Avoid periods of heavy perspiration until the skin adhesive has fallen off on its own. After showering or bathing, gently pat the wound dry with a clean towel.  Do not apply liquid medicine, cream medicine, or ointment medicine to your wound while the skin adhesive is in place. This Salce loosen the film before your wound is healed.  If a dressing is placed over the wound, be careful not to apply tape directly over the skin adhesive. This Spieker cause the adhesive to be pulled off before the wound is healed.  Avoid prolonged exposure to sunlight or tanning lamps while the skin adhesive is in place. Exposure to ultraviolet light in the first year will darken the scar.  The skin adhesive will usually remain in place for 5 to 10 days, then naturally fall off the skin. Do not pick at the adhesive film. You  Scheid need a tetanus shot if:  You cannot remember when you had your last tetanus shot.  You have never had a tetanus shot. If you get a tetanus shot, your arm Gramm swell, get red, and feel warm to the touch. This is common and not a problem. If you need a tetanus shot and you choose not to have one, there is a rare chance of getting tetanus. Sickness from tetanus can be serious. SEEK MEDICAL CARE IF:   You have redness, swelling, or increasing pain in the wound.  You see a red line that goes away from the wound.  You have yellowish-white fluid (pus) coming from  the wound.  You have a fever.  You notice a bad smell coming from the wound or dressing.  Your wound breaks open before or after sutures have been removed.  You notice something coming out of the wound such as wood or glass.  Your wound is on your hand or foot and you cannot move a finger or toe. SEEK IMMEDIATE MEDICAL CARE IF:   Your pain is not controlled with prescribed medicine.  You have severe swelling around the wound causing pain and numbness or a change in color in your arm, hand, leg, or foot.  Your wound splits open and starts bleeding.  You have worsening numbness, weakness, or loss of function of any joint around or beyond the wound.  You develop painful lumps near the wound or on the skin anywhere on your body. MAKE SURE YOU:   Understand these instructions.  Will watch your condition.  Will get help right away if you are not doing well or get worse. Document Released: 03/21/2005 Document Revised: 06/13/2011 Document Reviewed: 09/14/2010 [facility] Patient Information 2015 [place], [place]. This information is not intended to replace advice given to you by your health care provider. Make sure you discuss any questions you have with your health care provider.   Contusion A contusion is a deep bruise. Contusions are the result of an injury that caused bleeding under the skin. The contusion Boza turn blue, purple, or yellow. Minor injuries will give you a painless contusion, but more severe contusions Haymaker stay painful and swollen for a few weeks.  CAUSES  A contusion is usually caused by a blow, trauma, or direct force to an area of the body. SYMPTOMS   Swelling and redness of the injured area.  Bruising of the injured area.  Tenderness and soreness of the injured area.  Pain. DIAGNOSIS  The diagnosis can be made by taking a history and physical exam. An X-ray, CT scan, or MRI Ekblad be needed to determine if there were any associated injuries, such as  fractures. TREATMENT  Specific treatment will depend on what area of the body was injured. In general, the best treatment for a contusion is resting, icing, elevating, and applying cold compresses to the injured area. Over-the-counter medicines South also be recommended for pain control. Ask your caregiver what the best treatment is for your contusion. HOME CARE INSTRUCTIONS   Put ice on the injured area.  Put ice in a plastic bag.  Place a towel between your skin and the bag.  Leave the ice on for 15-20 minutes, 3-4 times a day, or as directed by your health care provider.  Only take over-the-counter or prescription medicines for pain, discomfort, or fever as directed by your caregiver. Your caregiver Metheney recommend avoiding anti-inflammatory medicines (aspirin, ibuprofen, and naproxen) for 48 hours because these medicines Akhavan increase  bruising.  Rest the injured area.  If possible, elevate the injured area to reduce swelling. SEEK IMMEDIATE MEDICAL CARE IF:   You have increased bruising or swelling.  You have pain that is getting worse.  Your swelling or pain is not relieved with medicines. MAKE SURE YOU:   Understand these instructions.  Will watch your condition.  Will get help right away if you are not doing well or get worse. Document Released: 12/29/2004 Document Revised: 03/26/2013 Document Reviewed: 01/24/2011 [facility] Patient Information 2015 [place], [place]. This information is not intended to replace advice given to you by your health care provider. Make sure you discuss any questions you have with your health care provider.  Fall Prevention and Home Safety Falls cause injuries and can affect all age groups. It is possible to use preventive measures to significantly decrease the likelihood of falls. There are many simple measures which can make your home safer and prevent falls. OUTDOORS  Repair cracks and edges of walkways and driveways.  Remove high doorway  thresholds.  Trim shrubbery on the main path into your home.  Have good outside lighting.  Clear walkways of tools, rocks, debris, and clutter.  Check that handrails are not broken and are securely fastened. Both sides of steps should have handrails.  Have leaves, snow, and ice cleared regularly.  Use sand or salt on walkways during winter months.  In the garage, clean up grease or oil spills. BATHROOM  Install night lights.  Install grab bars by the toilet and in the tub and shower.  Use non-skid mats or decals in the tub or shower.  Place a plastic non-slip stool in the shower to sit on, if needed.  Keep floors dry and clean up all water on the floor immediately.  Remove soap buildup in the tub or shower on a regular basis.  Secure bath mats with non-slip, double-sided rug tape.  Remove throw rugs and tripping hazards from the floors. BEDROOMS  Install night lights.  Make sure a bedside light is easy to reach.  Do not use oversized bedding.  Keep a telephone by your bedside.  Have a firm chair with side arms to use for getting dressed.  Remove throw rugs and tripping hazards from the floor. KITCHEN  Keep handles on pots and pans turned toward the center of the stove. Use back burners when possible.  Clean up spills quickly and allow time for drying.  Avoid walking on wet floors.  Avoid hot utensils and knives.  Position shelves so they are not too high or low.  Place commonly used objects within easy reach.  If necessary, use a sturdy step stool with a grab bar when reaching.  Keep electrical cables out of the way.  Do not use floor polish or wax that makes floors slippery. If you must use wax, use non-skid floor wax.  Remove throw rugs and tripping hazards from the floor. STAIRWAYS  Never leave objects on stairs.  Place handrails on both sides of stairways and use them. Fix any loose handrails. Make sure handrails on both sides of the stairways  are as long as the stairs.  Check carpeting to make sure it is firmly attached along stairs. Make repairs to worn or loose carpet promptly.  Avoid placing throw rugs at the top or bottom of stairways, or properly secure the rug with carpet tape to prevent slippage. Get rid of throw rugs, if possible.  Have an electrician put in a light switch at the  top and bottom of the stairs. OTHER FALL PREVENTION TIPS  Wear low-heel or rubber-soled shoes that are supportive and fit well. Wear closed toe shoes.  When using a stepladder, make sure it is fully opened and both spreaders are firmly locked. Do not climb a closed stepladder.  Add color or contrast paint or tape to grab bars and handrails in your home. Place contrasting color strips on first and last steps.  Learn and use mobility aids as needed. Install an electrical emergency response system.  Turn on lights to avoid dark areas. Replace light bulbs that burn out immediately. Get light switches that glow.  Arrange furniture to create clear pathways. Keep furniture in the same place.  Firmly attach carpet with non-skid or double-sided tape.  Eliminate uneven floor surfaces.  Select a carpet pattern that does not visually hide the edge of steps.  Be aware of all pets. OTHER HOME SAFETY TIPS  Set the water temperature for 120 F (48.8 C).  Keep emergency numbers on or near the telephone.  Keep smoke detectors on every level of the home and near sleeping areas. Document Released: 03/11/2002 Document Revised: 09/20/2011 Document Reviewed: 06/10/2011 [facility] Patient Information 2015 [place], [place]. This information is not intended to replace advice given to you by your health care provider. Make sure you discuss any questions you have with your health care provider.  Td Vaccine (Tetanus and Diphtheria): What You Need to Know 1. Why get vaccinated? Tetanus  and diphtheria are very serious diseases. They are rare in the [place]  today, but people who do become infected often have severe complications. Td vaccine is used to protect adolescents and adults from both of these diseases. Both tetanus and diphtheria are infections caused by bacteria. Diphtheria spreads from person to person through coughing or sneezing. Tetanus-causing bacteria enter the body through cuts, scratches, or wounds. TETANUS (Lockjaw) causes painful muscle tightening and stiffness, usually all over the body.  It can lead to tightening of muscles in the head and neck so you can't open your mouth, swallow, or sometimes even breathe. Tetanus kills about 1 out of every 5 people who are infected. DIPHTHERIA can cause a thick coating to form in the back of the throat.  It can lead to breathing problems, paralysis, heart failure, and death. Before vaccines, the [place] saw as many as 200,000 cases a year of diphtheria and hundreds of cases of tetanus. Since vaccination began, cases of both diseases have dropped by about 99%. 2. Td vaccine Td vaccine can protect adolescents and adults from tetanus and diphtheria. Td is usually given as a booster dose every 10 years but it can also be given earlier after a severe and dirty wound or burn. Your doctor can give you more information. Td Bose safely be given at the same time as other vaccines. 3. Some people should not get this vaccine  If you ever had a life-threatening allergic reaction after a dose of any tetanus or diphtheria containing vaccine, OR if you have a severe allergy to any part of this vaccine, you should not get Td. Tell your doctor if you have any severe allergies.  Talk to your doctor if you:  have epilepsy or another nervous system problem,  had severe pain or swelling after any vaccine containing diphtheria or tetanus,  ever had Guillain Barr Syndrome (GBS),  aren't feeling well on the day the shot is scheduled. 4. Risks of a vaccine reaction With a vaccine, like any medicine,  there is a chance of side effects. These are usually mild and go away on their own. Serious side effects are also possible, but are very rare. Most people who get Td vaccine do not have any problems with it. Mild Problems  following Td (Did not interfere with activities)  Pain where the shot was given (about 8 people in 10)  Redness or swelling where the shot was given (about 1 person in 3)  Mild fever (about 1 person in 15)  Headache or Tiredness (uncommon) Moderate Problems following Td (Interfered with activities, but did not require medical attention)  Fever over 102F (rare) Severe Problems  following Td (Unable to perform usual activities; required medical attention)  Swelling, severe pain, bleeding and/or redness in the arm where the shot was given (rare). Problems that could happen after any vaccine:  Brief fainting spells can happen after any medical procedure, including vaccination. Sitting or lying down for about 15 minutes can help prevent fainting, and injuries caused by a fall. Tell your doctor if you feel dizzy, or have vision changes or ringing in the ears.  Severe shoulder pain and reduced range of motion in the arm where a shot was given can happen, very rarely, after a vaccination.  Severe allergic reactions from a vaccine are very rare, estimated at less than 1 in a million doses. If one were to occur, it would usually be within a few minutes to a few hours after the vaccination. 5. What if there is a serious reaction? What should I look for?  Look for anything that concerns you, such as signs of a severe allergic reaction, very high fever, or behavior changes. Signs of a severe allergic reaction can include hives, swelling of the face and throat, difficulty breathing, a fast heartbeat, dizziness, and weakness. These would usually start a few minutes to a few hours after the vaccination. What should I do?  If you think it is a severe allergic reaction or other  emergency that can't wait, call 9-1-1 or get the person to the nearest hospital. Otherwise, call your doctor.  Afterward, the reaction should be reported to the Vaccine Adverse Event Reporting System (VAERS). Your doctor might file this report, or you can do it yourself through the VAERS web site at www.vaers.SamedayNews.es, or by calling 740-323-9923. VAERS is only for reporting reactions. They do not give medical advice. 6. The National Vaccine Injury Compensation Program The [employer] Vaccine Injury Compensation Program (VICP) is a federal program that was created to compensate people who Bisson have been injured by certain vaccines. Persons who believe they Cubero have been injured by a vaccine can learn about the program and about filing a claim by calling 564-650-2481 or visiting the [place] website at GoldCloset.com.ee. 7. How can I learn more?  Ask your doctor.  Contact your local or state health department.  Contact the Centers for Disease Control and Prevention (CDC):  Call (604) 043-5490 (1-800-CDC-INFO)  Visit CDC's website at http://hunter.com/ CDC Td Vaccine Interim VIS (05/08/12) Document Released: 01/16/2006 Document Revised: 08/05/2013 Document Reviewed: 07/03/2013 [facility] Patient Information 2015 [place], [place]. This information is not intended to replace advice given to you by your health care provider. Make sure you discuss any questions you have with your health care provider.

## 2014-11-29 NOTE — ED Notes (Signed)
Pt. Left with all belongings. Discharge instructions were reviewed and all questions were answered.  

## 2014-12-03 ENCOUNTER — Ambulatory Visit (INDEPENDENT_AMBULATORY_CARE_PROVIDER_SITE_OTHER): Payer: Commercial Managed Care - HMO | Admitting: Family Medicine

## 2014-12-03 ENCOUNTER — Encounter: Payer: Self-pay | Admitting: Family Medicine

## 2014-12-03 VITALS — BP 116/78 | HR 76 | Temp 97.6°F | Wt 161.5 lb

## 2014-12-03 DIAGNOSIS — R1111 Vomiting without nausea: Secondary | ICD-10-CM | POA: Diagnosis not present

## 2014-12-03 DIAGNOSIS — W19XXXA Unspecified fall, initial encounter: Secondary | ICD-10-CM

## 2014-12-03 DIAGNOSIS — Y92099 Unspecified place in other non-institutional residence as the place of occurrence of the external cause: Secondary | ICD-10-CM | POA: Diagnosis not present

## 2014-12-03 DIAGNOSIS — Y92009 Unspecified place in unspecified non-institutional (private) residence as the place of occurrence of the external cause: Principal | ICD-10-CM | POA: Insufficient documentation

## 2014-12-03 NOTE — Patient Instructions (Signed)
Take care.  Glad to see you.  The steristrips will gradually wear off.  Don't pull them.  Let me know if you want to go to PT to work on balance.

## 2014-12-03 NOTE — Assessment & Plan Note (Signed)
Appetite okay, no weight loss.   He had seen Dr. Cristina Gong and per wife pt was encouraged to consider retrial of aricept.  We tabled this for now given the recent fall.   I'll await the GI notes.   Pt/wife agrees.

## 2014-12-03 NOTE — Assessment & Plan Note (Signed)
Sutures removed, steristrips applied, see AVS.  No complications.  Should resolve.  Encouraged PT. He and wife will consider.   She is looking after him at home in the meantime.  F/u prn re: lac.

## 2014-12-03 NOTE — Progress Notes (Signed)
Pre visit review using our clinic review tool, if applicable. No additional management support is needed unless otherwise documented below in the visit note.  Fell at home.  To ER.  Neg imaging.  L eyebrow lac sutured.  Here for removal.   D/w pt about falls at home.  This was the only fall but that was in the distant past.  Using a cane.   D/w pt and wife about PT for gait training.  Declined by patient.  I encouraged him to consider it.  D/w pt.    He had seen Dr. Cristina Gong and per wife pt was encouraged to consider retrial of aricept.  We tabled this for now given the recent fall.   I'll await the GI notes.    Meds, vitals, and allergies reviewed.   ROS: See HPI.  Otherwise, noncontributory.  nad ncat except for sutures on L brow and bruising below the L eye.   EOMI B L eyebrow slightly puffy as expected, 11 sutures removed easily, still with good tissue apposition.   No purulent discharge.   No spreading erythema.  rrr ctab abd soft, not ttp

## 2014-12-11 ENCOUNTER — Telehealth: Payer: Self-pay

## 2014-12-11 MED ORDER — DONEPEZIL HCL 10 MG PO TABS: 10.0000 mg | ORAL_TABLET | Freq: Every day | ORAL | Status: DC

## 2014-12-11 NOTE — Telephone Encounter (Signed)
Noted. Thanks.

## 2014-12-11 NOTE — Telephone Encounter (Signed)
Patient's wife returned phone call, and said that they agree to restarting the Aricept. Patient's wife says that she still has a few pills left, and that she believes it has one refill on that bottle, so they can use that, and let us know when they would need a refill.

## 2014-12-25 ENCOUNTER — Emergency Department (HOSPITAL_COMMUNITY): Payer: Commercial Managed Care - HMO

## 2014-12-25 ENCOUNTER — Inpatient Hospital Stay (HOSPITAL_COMMUNITY)
Admission: EM | Admit: 2014-12-25 | Discharge: 2014-12-30 | DRG: 480 | Disposition: A | Discharge status: Skilled Nursing Facility | Payer: Commercial Managed Care - HMO | Attending: Internal Medicine | Admitting: Internal Medicine

## 2014-12-25 ENCOUNTER — Encounter (HOSPITAL_COMMUNITY): Payer: Self-pay | Admitting: Emergency Medicine

## 2014-12-25 DIAGNOSIS — S72142D Displaced intertrochanteric fracture of left femur, subsequent encounter for closed fracture with routine healing: Secondary | ICD-10-CM | POA: Diagnosis not present

## 2014-12-25 DIAGNOSIS — K5900 Constipation, unspecified: Secondary | ICD-10-CM | POA: Diagnosis not present

## 2014-12-25 DIAGNOSIS — Z66 Do not resuscitate: Secondary | ICD-10-CM | POA: Diagnosis present

## 2014-12-25 DIAGNOSIS — I739 Peripheral vascular disease, unspecified: Secondary | ICD-10-CM | POA: Diagnosis present

## 2014-12-25 DIAGNOSIS — Z9049 Acquired absence of other specified parts of digestive tract: Secondary | ICD-10-CM | POA: Diagnosis present

## 2014-12-25 DIAGNOSIS — W19XXXD Unspecified fall, subsequent encounter: Secondary | ICD-10-CM | POA: Diagnosis not present

## 2014-12-25 DIAGNOSIS — M25562 Pain in left knee: Secondary | ICD-10-CM | POA: Diagnosis present

## 2014-12-25 DIAGNOSIS — Z7982 Long term (current) use of aspirin: Secondary | ICD-10-CM

## 2014-12-25 DIAGNOSIS — Z96653 Presence of artificial knee joint, bilateral: Secondary | ICD-10-CM | POA: Diagnosis present

## 2014-12-25 DIAGNOSIS — S72142A Displaced intertrochanteric fracture of left femur, initial encounter for closed fracture: Secondary | ICD-10-CM | POA: Diagnosis present

## 2014-12-25 DIAGNOSIS — F0390 Unspecified dementia without behavioral disturbance: Secondary | ICD-10-CM | POA: Diagnosis present

## 2014-12-25 DIAGNOSIS — Z8249 Family history of ischemic heart disease and other diseases of the circulatory system: Secondary | ICD-10-CM | POA: Diagnosis not present

## 2014-12-25 DIAGNOSIS — D72829 Elevated white blood cell count, unspecified: Secondary | ICD-10-CM | POA: Diagnosis not present

## 2014-12-25 DIAGNOSIS — D62 Acute posthemorrhagic anemia: Secondary | ICD-10-CM | POA: Diagnosis not present

## 2014-12-25 DIAGNOSIS — I10 Essential (primary) hypertension: Secondary | ICD-10-CM | POA: Diagnosis present

## 2014-12-25 DIAGNOSIS — J189 Pneumonia, unspecified organism: Secondary | ICD-10-CM | POA: Diagnosis present

## 2014-12-25 DIAGNOSIS — Y92520 Airport as the place of occurrence of the external cause: Secondary | ICD-10-CM

## 2014-12-25 DIAGNOSIS — W1830XA Fall on same level, unspecified, initial encounter: Secondary | ICD-10-CM | POA: Diagnosis present

## 2014-12-25 DIAGNOSIS — W19XXXA Unspecified fall, initial encounter: Secondary | ICD-10-CM

## 2014-12-25 DIAGNOSIS — Z823 Family history of stroke: Secondary | ICD-10-CM | POA: Diagnosis not present

## 2014-12-25 DIAGNOSIS — Z79899 Other long term (current) drug therapy: Secondary | ICD-10-CM

## 2014-12-25 DIAGNOSIS — W19XXXS Unspecified fall, sequela: Secondary | ICD-10-CM | POA: Diagnosis not present

## 2014-12-25 DIAGNOSIS — Z419 Encounter for procedure for purposes other than remedying health state, unspecified: Secondary | ICD-10-CM

## 2014-12-25 DIAGNOSIS — K219 Gastro-esophageal reflux disease without esophagitis: Secondary | ICD-10-CM | POA: Diagnosis present

## 2014-12-25 DIAGNOSIS — S72142S Displaced intertrochanteric fracture of left femur, sequela: Secondary | ICD-10-CM | POA: Diagnosis not present

## 2014-12-25 DIAGNOSIS — Y92009 Unspecified place in unspecified non-institutional (private) residence as the place of occurrence of the external cause: Secondary | ICD-10-CM

## 2014-12-25 DIAGNOSIS — S7292XA Unspecified fracture of left femur, initial encounter for closed fracture: Secondary | ICD-10-CM

## 2014-12-25 DIAGNOSIS — E039 Hypothyroidism, unspecified: Secondary | ICD-10-CM | POA: Diagnosis present

## 2014-12-25 LAB — CBC WITH DIFFERENTIAL/PLATELET
Basophils Absolute: 0.1 10*3/uL (ref 0.0–0.1)
Basophils Relative: 0 %
EOS ABS: 0.1 10*3/uL (ref 0.0–0.7)
EOS PCT: 1 %
HCT: 38.1 % — ABNORMAL LOW (ref 39.0–52.0)
Hemoglobin: 12.5 g/dL — ABNORMAL LOW (ref 13.0–17.0)
LYMPHS ABS: 0.8 10*3/uL (ref 0.7–4.0)
Lymphocytes Relative: 6 %
MCH: 26.9 pg (ref 26.0–34.0)
MCHC: 32.8 g/dL (ref 30.0–36.0)
MCV: 82.1 fL (ref 78.0–100.0)
MONO ABS: 0.8 10*3/uL (ref 0.1–1.0)
MONOS PCT: 6 %
Neutro Abs: 12.7 10*3/uL — ABNORMAL HIGH (ref 1.7–7.7)
Neutrophils Relative %: 87 %
PLATELETS: 200 10*3/uL (ref 150–400)
RBC: 4.64 MIL/uL (ref 4.22–5.81)
RDW: 14.4 % (ref 11.5–15.5)
WBC: 14.5 10*3/uL — ABNORMAL HIGH (ref 4.0–10.5)

## 2014-12-25 LAB — BASIC METABOLIC PANEL
Anion gap: 7 (ref 5–15)
BUN: 28 mg/dL — AB (ref 6–20)
CHLORIDE: 108 mmol/L (ref 101–111)
CO2: 25 mmol/L (ref 22–32)
CREATININE: 1.2 mg/dL (ref 0.61–1.24)
Calcium: 8.7 mg/dL — ABNORMAL LOW (ref 8.9–10.3)
GFR calc Af Amer: >60 mL/min (ref >60)
GFR calc non Af Amer: 54 mL/min — ABNORMAL LOW (ref >60)
Glucose, Bld: 117 mg/dL — ABNORMAL HIGH (ref 65–99)
Potassium: 4 mmol/L (ref 3.5–5.1)
Sodium: 140 mmol/L (ref 135–145)

## 2014-12-25 LAB — TYPE AND SCREEN
ABO/RH(D): O POS
Antibody Screen: NEGATIVE

## 2014-12-25 LAB — ABO/RH: ABO/RH(D): O POS

## 2014-12-25 MED ORDER — FENTANYL CITRATE (PF) 100 MCG/2ML IJ SOLN
50.0000 ug | Freq: Once | INTRAMUSCULAR | Status: AC
  Administered 2014-12-25: 50 ug via INTRAVENOUS
  Filled 2014-12-25: qty 2

## 2014-12-25 MED ORDER — HYDROCODONE-ACETAMINOPHEN 5-325 MG PO TABS
1.0000 | ORAL_TABLET | Freq: Four times a day (QID) | ORAL | Status: DC | PRN
  Administered 2014-12-25 – 2014-12-26 (×2): 2 via ORAL
  Filled 2014-12-25 (×2): qty 2

## 2014-12-25 MED ORDER — MORPHINE SULFATE (PF) 2 MG/ML IV SOLN
0.5000 mg | INTRAVENOUS | Status: DC | PRN
  Administered 2014-12-26: 2 mg via INTRAVENOUS
  Administered 2014-12-26 – 2014-12-29 (×10): 0.5 mg via INTRAVENOUS
  Filled 2014-12-25 (×11): qty 1

## 2014-12-25 MED ORDER — LEVOTHYROXINE SODIUM 100 MCG PO TABS
100.0000 ug | ORAL_TABLET | Freq: Every day | ORAL | Status: DC
  Administered 2014-12-26 – 2014-12-30 (×5): 100 ug via ORAL
  Filled 2014-12-25 (×6): qty 1

## 2014-12-25 MED ORDER — DONEPEZIL HCL 10 MG PO TABS
10.0000 mg | ORAL_TABLET | Freq: Every day | ORAL | Status: DC
  Administered 2014-12-26 – 2014-12-30 (×6): 10 mg via ORAL
  Filled 2014-12-25 (×7): qty 1

## 2014-12-25 MED ORDER — TERAZOSIN HCL 5 MG PO CAPS
5.0000 mg | ORAL_CAPSULE | Freq: Every day | ORAL | Status: DC
  Administered 2014-12-26 – 2014-12-29 (×4): 5 mg via ORAL
  Filled 2014-12-25 (×7): qty 1

## 2014-12-25 MED ORDER — PANTOPRAZOLE SODIUM 40 MG PO TBEC
80.0000 mg | DELAYED_RELEASE_TABLET | Freq: Every day | ORAL | Status: DC
  Administered 2014-12-26 – 2014-12-30 (×5): 80 mg via ORAL
  Filled 2014-12-25 (×5): qty 2

## 2014-12-25 MED ORDER — ASPIRIN EC 325 MG PO TBEC
325.0000 mg | DELAYED_RELEASE_TABLET | Freq: Every day | ORAL | Status: DC
  Filled 2014-12-25 (×3): qty 1

## 2014-12-25 NOTE — ED Provider Notes (Signed)
CSN: [id number]     Arrival date & time 12/25/14  1915 History   First MD Initiated Contact with Patient 12/25/14 1917     Chief Complaint  Patient presents with  . Knee Pain   Level V Caveat: Dementia   [name] is a 81 y.o. male with a history of dementia, and hypertension who presents to the ED after a fall complaining of pain from his left hip to his left knee. The patent is accompanied by his wife and cousin. They report he was in the airport and walking out of the bathroom when his legs "gave out" and he fell on his left leg. They report that he did not hit his head or loose consciousness. The patient is complaining of pain from his left hip to his left knee. The patient has a history of left knee TKA. He denies numbness or tingling. Apparently EMS gave the patient 150 mg fentanyl and his respirations dropped below 10 and EMS gave 2 mg narcan and his respirations stabilized.    (Consider location/radiation/quality/duration/timing/severity/associated sxs/prior Treatment) HPI  Past Medical History  Diagnosis Date  . Hypertension   . Hypothyroidism   . Diverticulosis of colon   . BPH (benign prostatic hypertrophy)   . Pancreatitis 2011    and gallstone pancreatitis and Ecoli bacteremia  . 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 followed by [name]- observation as of 09/2010  . Rabbit fever 1940  . History of shingles   . Dementia    Past Surgical History  Procedure Laterality Date  . Appendectomy  1959  . Thyroidectomy, partial  1966  . Knee arthroscopy  06/1995    left Dr Derrel Nip  . Joint replacement  04/30/2001    bilateral TKR  . Cholecystectomy  10/2009  . Eye muscle surgery      12/12 had double vision Dr. Juleen China at [facility] History  Problem Relation Age of Onset  . Hypertension Mother   . Stroke Mother   . Alcohol abuse Father   . Cancer Brother     prostate CA  . Heart disease Brother    Social History  Substance Use Topics  .  Smoking status: Never Smoker   . Smokeless tobacco: Never Used  . Alcohol Use: No    Review of Systems  Unable to perform ROS: Dementia  Musculoskeletal: Positive for arthralgias. Negative for back pain and neck pain.  Neurological: Negative for headaches.      Allergies  Sulfonamide derivatives and Valtrex  Home Medications   Prior to Admission medications   Medication Sig Start Date End Date Taking? Authorizing Provider  aspirin EC 325 MG EC tablet Take 1 tablet (325 mg total) by mouth daily. 07/13/12  Yes Ripudeep Krystal Eaton, MD  B Complex-C (B-COMPLEX WITH VITAMIN C) tablet Take 1 tablet by mouth daily. 07/13/12  Yes Ripudeep Krystal Eaton, MD  donepezil (ARICEPT) 10 MG tablet Take 1 tablet (10 mg total) by mouth daily. 12/11/14  Yes Tonia Ghent, MD  levothyroxine (SYNTHROID, LEVOTHROID) 100 MCG tablet TAKE 1 TABLET EVERY DAY Patient taking differently: TAKE 100 MCG BY MOUTH DAILY 09/26/14  Yes Tonia Ghent, MD  Multiple Vitamin (MULTIVITAMIN) tablet Take 1 tablet by mouth daily.     Yes Historical Provider, MD  omeprazole (PRILOSEC) 40 MG capsule Take 40 mg by mouth every other day.  12/10/14  Yes Historical Provider, MD  Probiotic Product (PROBIOTIC PO) Take  1 tablet by mouth daily.   Yes Historical Provider, MD  terazosin (HYTRIN) 5 MG capsule Take 1 capsule (5 mg total) by mouth at bedtime. 08/11/14  Yes Tonia Ghent, MD   BP 172/79 mmHg  Pulse 79  Temp(Src) 97.8 F (36.6 C) (Oral)  Resp 16  Ht 5\' 10"  (1.778 m)  Wt 150 lb (68.04 kg)  BMI 21.52 kg/m2  SpO2 98% Physical Exam  Constitutional: He appears well-developed and well-nourished. No distress.  HENT:  Head: Normocephalic and atraumatic.  Right Ear: External ear normal.  Left Ear: External ear normal.  No signs of head trauma.   Eyes: Conjunctivae are normal. Pupils are equal, round, and reactive to light. Right eye exhibits no discharge. Left eye exhibits no discharge.  Neck: Normal range of motion. Neck supple.   Cardiovascular: Normal rate, regular rhythm, normal heart sounds and intact distal pulses.   Bilateral radial, posterior tibialis and dorsalis pedis pulses are intact.    Pulmonary/Chest: Effort normal and breath sounds normal. No respiratory distress.  Abdominal: Soft. There is no tenderness.  Musculoskeletal: He exhibits tenderness. He exhibits no edema.  Tenderness to his left hip and left knee. Pain with manipulation of his left lower leg. No rotation or shortening noted. No deformity noted. No edema noted. No pelvic instability noted.   Lymphadenopathy:    He has no cervical adenopathy.  Neurological: He is alert. Coordination normal.  Patient is oriented to person only. Demented. Sensation is intact to his bilateral lower extremities.   Skin: Skin is warm and dry. No rash noted. He is not diaphoretic. No erythema. No pallor.  Psychiatric: He has a normal mood and affect. His behavior is normal.  Nursing note and vitals reviewed.   ED Course  Procedures (including critical care time) Labs Review Labs Reviewed  BASIC METABOLIC PANEL - Abnormal; Notable for the following:    Glucose, Bld 117 (*)    BUN 28 (*)    Calcium 8.7 (*)    GFR calc non Af Amer 54 (*)    All other components within normal limits  CBC WITH DIFFERENTIAL/PLATELET - Abnormal; Notable for the following:    WBC 14.5 (*)    Hemoglobin 12.5 (*)    HCT 38.1 (*)    Neutro Abs 12.7 (*)    All other components within normal limits  TYPE AND SCREEN  ABO/RH    Imaging Review Dg Knee 1-2 Views Left  12/25/2014   CLINICAL DATA:  Legs gave out at airport, patient fell injuring LEFT knee, history of dementia and knee replacement surgery  EXAM: LEFT KNEE - 1-2 VIEW  COMPARISON:  11/29/2014  FINDINGS: Diffuse osseous demineralization.  Components of LEFT knee prosthesis in expected positions.  No definite fracture, dislocation or bone destruction.  No periprosthetic lucency or joint effusion.  IMPRESSION: LEFT knee  prosthesis and osseous demineralization without acute abnormalities.   Electronically Signed   By: Lavonia Dana M.D.   On: 12/25/2014 21:02   Dg Hip Unilat With Pelvis 2-3 Views Left  12/25/2014   CLINICAL DATA:  EMS from airport where his legs "gave out" and he fell, injuring the left knee. Hx of bilateral TKA and dementia.  EXAM: DG HIP (WITH OR WITHOUT PELVIS) 2-3V LEFT  COMPARISON:  None.  FINDINGS: There is a comminuted displaced fracture of the proximal left femur. The fractures intertrochanteric. There separate fracture components of the lesser greater trochanter. The primary fracture components are displaced by approximately 16 mm, the  shaft fracture component displacing laterally and mildly retracting superiorly. Lesser and greater trochanter fracture components are also mildly displaced. There is mild varus angulation.  No other fractures. Hip joint is normally spaced and aligned. Bones are diffusely demineralized.  IMPRESSION: Comminuted displaced intertrochanteric fracture of the proximal left femur.   Electronically Signed   By: Lajean Manes M.D.   On: 12/25/2014 21:00   I have personally reviewed and evaluated these images and lab results as part of my medical decision-making.   EKG Interpretation None      Filed Vitals:   12/25/14 1933 12/25/14 1936 12/25/14 1936  BP:  172/79 172/79  Pulse:  81 79  Temp:  97.8 F (36.6 C)   TempSrc:  Oral   Resp:  20 16  Height: 5\' 10"  (1.778 m)    Weight: 150 lb (68.04 kg)    SpO2:  97% 98%     MDM   Meds given in ED:  Medications  fentaNYL (SUBLIMAZE) injection 50 mcg (50 mcg Intravenous Given 12/25/14 2111)    New Prescriptions   No medications on file    Final diagnoses:  Intertrochanteric fracture of left femur, closed, initial encounter  Fall, initial encounter   This is a 81 y.o. male with a history of dementia, and hypertension who presents to the ED after a fall complaining of pain from his left hip to his left knee. The  patent is accompanied by his wife and cousin. They report he was in the airport and walking out of the bathroom when his legs "gave out" and he fell on his left leg. They report that he did not hit his head or loose consciousness. The patient is complaining of pain from his left hip to his left knee.  On exam the patient is afebrile nontoxic appearing. He has dementia and is oriented to person only. He is pleasantly demented. The patient has tenderness to his left hip and knee and pain with manipulation of his left lower leg. His left leg is neurovascularly intact.  Left hip x-ray indicates a comminuted displaced intertrochanteric fracture of the proximal left femur. Left knee x-ray is negative for acute abnormality. I consulted with orthopedic surgeon Dr. Alvan Dame who would like the patient admitted by medicine and kept NPO for surgery in the AM.  Dr. Alcario Drought accepted the patient for admission and requested med surg bed orders placed.  The family is in agreement with admission.   This patient was discussed with and evaluated by Dr. Tyrone Nine who agrees with assessment and plan.   Waynetta Pean, PA-C 12/25/14 2148  Deno Etienne, DO 12/26/14 [place], DO 12/26/14 0005

## 2014-12-25 NOTE — ED Notes (Signed)
Bed: WA21 Expected date:  Expected time:  Means of arrival:  Comments: EMS- 81yo M, fall/knee pain

## 2014-12-25 NOTE — ED Notes (Signed)
Pt presents via EMS from airport where his legs "gave out" and he fell, injuring the left knee. Hx of bilateral TKA and dementia.  10/10 pain and nausea.  EMS inserted 18g IV in right hand and administered 162mcg Fentanyl, 8mg  Zofran, and 2mg  Narcan when RR dropped below 10.  Pt respirations and oxygen saturation are now WNL on 3L nasal cannula.

## 2014-12-25 NOTE — H&P (Signed)
[facility] History and Physical  Hiran Leard Metzner HUT:[id number] DOB: 12/20/1930 DOA: 12/25/2014  Referring physician: EDP PCP: Elsie Stain, MD   Chief Complaint: Fall   HPI: Darion Milewski Yi is a 81 y.o. male who suffered a mechanical fall at the airport today when his "legs gave out" on him when coming out of the bathroom.  Patient had immediate pain in his left knee after the fall and inability to bear weight on that leg.  Had to get narcan on the ambulance on the way here after 161mcg fentanyl was given and caused oversedation / respiratory depression.  Review of Systems: Systems reviewed.  As above, otherwise negative  Past Medical History  Diagnosis Date  . Hypertension   . Hypothyroidism   . Diverticulosis of colon   . BPH (benign prostatic hypertrophy)   . Pancreatitis 2011    and gallstone pancreatitis and Ecoli bacteremia  . 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 followed by [name]- observation as of 09/2010  . Rabbit fever 1940  . History of shingles   . Dementia    Past Surgical History  Procedure Laterality Date  . Appendectomy  1959  . Thyroidectomy, partial  1966  . Knee arthroscopy  06/1995    left Dr Derrel Nip  . Joint replacement  04/30/2001    bilateral TKR  . Cholecystectomy  10/2009  . Eye muscle surgery      12/12 had double vision Dr. Juleen China at [facility] History:  reports that he has never smoked. He has never used smokeless tobacco. He reports that he does not drink alcohol or use illicit drugs.  Allergies  Allergen Reactions  . Sulfonamide Derivatives Other (See Comments)    Childhood allergy  . Valtrex [Valacyclovir Hcl]     NAV    Family History  Problem Relation Age of Onset  . Hypertension Mother   . Stroke Mother   . Alcohol abuse Father   . Cancer Brother     prostate CA  . Heart disease Brother      Prior to Admission medications   Medication Sig Start Date End Date Taking? Authorizing Provider   aspirin EC 325 MG EC tablet Take 1 tablet (325 mg total) by mouth daily. 07/13/12  Yes Ripudeep Krystal Eaton, MD  B Complex-C (B-COMPLEX WITH VITAMIN C) tablet Take 1 tablet by mouth daily. 07/13/12  Yes Ripudeep Krystal Eaton, MD  donepezil (ARICEPT) 10 MG tablet Take 1 tablet (10 mg total) by mouth daily. 12/11/14  Yes Tonia Ghent, MD  levothyroxine (SYNTHROID, LEVOTHROID) 100 MCG tablet TAKE 1 TABLET EVERY DAY Patient taking differently: TAKE 100 MCG BY MOUTH DAILY 09/26/14  Yes Tonia Ghent, MD  Multiple Vitamin (MULTIVITAMIN) tablet Take 1 tablet by mouth daily.     Yes Historical Provider, MD  omeprazole (PRILOSEC) 40 MG capsule Take 40 mg by mouth every other day.  12/10/14  Yes Historical Provider, MD  Probiotic Product (PROBIOTIC PO) Take 1 tablet by mouth daily.   Yes Historical Provider, MD  terazosin (HYTRIN) 5 MG capsule Take 1 capsule (5 mg total) by mouth at bedtime. 08/11/14  Yes Tonia Ghent, MD   Physical Exam: Filed Vitals:   12/25/14 1936  BP: 172/79  Pulse: 79  Temp:   Resp: 16    BP 172/79 mmHg  Pulse 79  Temp(Src) 97.8 F (36.6 C) (Oral)  Resp 16  Ht 5\' 10"  (1.778 m)  Wt 68.04 kg (150 lb)  BMI 21.52 kg/m2  SpO2 98%  General Appearance:    Alert, oriented, no distress, appears stated age  Head:    Normocephalic, atraumatic  Eyes:    PERRL, EOMI, sclera non-icteric        Nose:   Nares without drainage or epistaxis. Mucosa, turbinates normal  Throat:   Moist mucous membranes. Oropharynx without erythema or exudate.  Neck:   Supple. No carotid bruits.  No thyromegaly.  No lymphadenopathy.   Back:     No CVA tenderness, no spinal tenderness  Lungs:     Clear to auscultation bilaterally, without wheezes, rhonchi or rales  Chest wall:    No tenderness to palpitation  Heart:    Regular rate and rhythm without murmurs, gallops, rubs  Abdomen:     Soft, non-tender, nondistended, normal bowel sounds, no organomegaly  Genitalia:    deferred  Rectal:    deferred   Extremities:   No clubbing, cyanosis or edema.  Pulses:   2+ and symmetric all extremities  Skin:   Skin color, texture, turgor normal, no rashes or lesions  Lymph nodes:   Cervical, supraclavicular, and axillary nodes normal  Neurologic:   CNII-XII intact. Normal strength, sensation and reflexes      throughout    Labs on Admission:  Basic Metabolic Panel:  Recent Labs Lab 12/25/14 1951  NA 140  K 4.0  CL 108  CO2 25  GLUCOSE 117*  BUN 28*  CREATININE 1.20  CALCIUM 8.7*   Liver Function Tests: No results for input(s): AST, ALT, ALKPHOS, BILITOT, PROT, ALBUMIN in the last 168 hours. No results for input(s): LIPASE, AMYLASE in the last 168 hours. No results for input(s): AMMONIA in the last 168 hours. CBC:  Recent Labs Lab 12/25/14 1951  WBC 14.5*  NEUTROABS 12.7*  HGB 12.5*  HCT 38.1*  MCV 82.1  PLT 200   Cardiac Enzymes: No results for input(s): CKTOTAL, CKMB, CKMBINDEX, TROPONINI in the last 168 hours.  BNP (last 3 results) No results for input(s): PROBNP in the last 8760 hours. CBG: No results for input(s): GLUCAP in the last 168 hours.  Radiological Exams on Admission: Dg Knee 1-2 Views Left  12/25/2014   CLINICAL DATA:  Legs gave out at airport, patient fell injuring LEFT knee, history of dementia and knee replacement surgery  EXAM: LEFT KNEE - 1-2 VIEW  COMPARISON:  11/29/2014  FINDINGS: Diffuse osseous demineralization.  Components of LEFT knee prosthesis in expected positions.  No definite fracture, dislocation or bone destruction.  No periprosthetic lucency or joint effusion.  IMPRESSION: LEFT knee prosthesis and osseous demineralization without acute abnormalities.   Electronically Signed   By: Lavonia Dana M.D.   On: 12/25/2014 21:02   Dg Hip Unilat With Pelvis 2-3 Views Left  12/25/2014   CLINICAL DATA:  EMS from airport where his legs "gave out" and he fell, injuring the left knee. Hx of bilateral TKA and dementia.  EXAM: DG HIP (WITH OR WITHOUT  PELVIS) 2-3V LEFT  COMPARISON:  None.  FINDINGS: There is a comminuted displaced fracture of the proximal left femur. The fractures intertrochanteric. There separate fracture components of the lesser greater trochanter. The primary fracture components are displaced by approximately 16 mm, the shaft fracture component displacing laterally and mildly retracting superiorly. Lesser and greater trochanter fracture components are also mildly displaced. There is mild varus angulation.  No other fractures. Hip joint is normally spaced and aligned. Bones are diffusely demineralized.  IMPRESSION: Comminuted displaced intertrochanteric fracture of the proximal left femur.   Electronically Signed   By: Lajean Manes M.D.   On: 12/25/2014 21:00    EKG: Independently reviewed.  Assessment/Plan Principal Problem:   Femur fracture, left   1. Left intertrochanteric femur fracture - 1. Hip fract protocol 2. Dr. Alvan Dame coming to see in AM 3. NPO 4. ASA/SCDs for DVT ppx 2. HTN - continue home meds 3. Dementia - continue home meds  Code Status: Full  Family Communication: Family at bedside Disposition Plan: Admit to inpatient   Time spent: 68 min  GARDNER, JARED M. [facility] Pager [phone]  If 7AM-7PM, please contact the day team taking care of the patient [email or web address] Password [facility] 12/25/2014, 9:51 PM

## 2014-12-26 ENCOUNTER — Inpatient Hospital Stay (HOSPITAL_COMMUNITY): Payer: Commercial Managed Care - HMO

## 2014-12-26 ENCOUNTER — Inpatient Hospital Stay (HOSPITAL_COMMUNITY): Payer: Commercial Managed Care - HMO | Admitting: Anesthesiology

## 2014-12-26 ENCOUNTER — Encounter (HOSPITAL_COMMUNITY)
Admission: EM | Disposition: A | Discharge status: Skilled Nursing Facility | Payer: Self-pay | Source: Home / Self Care | Attending: Internal Medicine

## 2014-12-26 ENCOUNTER — Encounter (HOSPITAL_COMMUNITY): Payer: Self-pay | Admitting: Registered Nurse

## 2014-12-26 DIAGNOSIS — W19XXXA Unspecified fall, initial encounter: Secondary | ICD-10-CM | POA: Diagnosis present

## 2014-12-26 DIAGNOSIS — S72142A Displaced intertrochanteric fracture of left femur, initial encounter for closed fracture: Secondary | ICD-10-CM | POA: Diagnosis present

## 2014-12-26 DIAGNOSIS — D72829 Elevated white blood cell count, unspecified: Secondary | ICD-10-CM

## 2014-12-26 HISTORY — PX: ORIF HIP FRACTURE: SHX2125

## 2014-12-26 LAB — URINALYSIS, ROUTINE W REFLEX MICROSCOPIC
Glucose, UA: NEGATIVE mg/dL
Hgb urine dipstick: NEGATIVE
Ketones, ur: NEGATIVE mg/dL
LEUKOCYTES UA: NEGATIVE
NITRITE: NEGATIVE
PH: 5 (ref 5.0–8.0)
Protein, ur: NEGATIVE mg/dL
SPECIFIC GRAVITY, URINE: 1.035 — AB (ref 1.005–1.030)
Urobilinogen, UA: 1 mg/dL (ref 0.0–1.0)

## 2014-12-26 LAB — SURGICAL PCR SCREEN
MRSA, PCR: NEGATIVE
STAPHYLOCOCCUS AUREUS: NEGATIVE

## 2014-12-26 SURGERY — OPEN REDUCTION INTERNAL FIXATION HIP
Anesthesia: General | Site: Hip | Laterality: Left

## 2014-12-26 MED ORDER — GLYCOPYRROLATE 0.2 MG/ML IJ SOLN
INTRAMUSCULAR | Status: DC | PRN
  Administered 2014-12-26: .5 mg via INTRAVENOUS

## 2014-12-26 MED ORDER — EPHEDRINE SULFATE 50 MG/ML IJ SOLN
INTRAMUSCULAR | Status: DC | PRN
  Administered 2014-12-26 (×3): 5 mg via INTRAVENOUS

## 2014-12-26 MED ORDER — MEPERIDINE HCL 25 MG/ML IJ SOLN: 6.2500 mg | INTRAMUSCULAR | Status: DC | PRN

## 2014-12-26 MED ORDER — DEXAMETHASONE SODIUM PHOSPHATE 10 MG/ML IJ SOLN
INTRAMUSCULAR | Status: DC | PRN
  Administered 2014-12-26: 10 mg via INTRAVENOUS

## 2014-12-26 MED ORDER — SODIUM CHLORIDE 0.9 % IV SOLN
INTRAVENOUS | Status: DC
Start: 2014-12-26 — End: 2014-12-27
  Administered 2014-12-27: 02:00:00 via INTRAVENOUS

## 2014-12-26 MED ORDER — LACTATED RINGERS IV SOLN
INTRAVENOUS | Status: DC
  Administered 2014-12-26: 1000 mL via INTRAVENOUS

## 2014-12-26 MED ORDER — ALUM & MAG HYDROXIDE-SIMETH 200-200-20 MG/5ML PO SUSP: 30.0000 mL | ORAL | Status: DC | PRN

## 2014-12-26 MED ORDER — LIDOCAINE HCL (CARDIAC) 20 MG/ML IV SOLN
INTRAVENOUS | Status: AC
  Filled 2014-12-26: qty 5

## 2014-12-26 MED ORDER — FENTANYL CITRATE (PF) 100 MCG/2ML IJ SOLN
INTRAMUSCULAR | Status: AC
  Filled 2014-12-26: qty 4

## 2014-12-26 MED ORDER — PROMETHAZINE HCL 25 MG/ML IJ SOLN: 6.2500 mg | INTRAMUSCULAR | Status: DC | PRN

## 2014-12-26 MED ORDER — ONDANSETRON HCL 4 MG/2ML IJ SOLN
INTRAMUSCULAR | Status: DC | PRN
  Administered 2014-12-26: 4 mg via INTRAVENOUS

## 2014-12-26 MED ORDER — CEFAZOLIN SODIUM-DEXTROSE 2-3 GM-% IV SOLR
INTRAVENOUS | Status: AC
  Filled 2014-12-26: qty 50

## 2014-12-26 MED ORDER — DOCUSATE SODIUM 100 MG PO CAPS
100.0000 mg | ORAL_CAPSULE | Freq: Two times a day (BID) | ORAL | Status: DC
  Administered 2014-12-27 – 2014-12-30 (×7): 100 mg via ORAL

## 2014-12-26 MED ORDER — NEOSTIGMINE METHYLSULFATE 10 MG/10ML IV SOLN
INTRAVENOUS | Status: DC | PRN
  Administered 2014-12-26: 3.5 mg via INTRAVENOUS

## 2014-12-26 MED ORDER — METOCLOPRAMIDE HCL 10 MG PO TABS: 5.0000 mg | ORAL_TABLET | Freq: Three times a day (TID) | ORAL | Status: DC | PRN

## 2014-12-26 MED ORDER — ACETAMINOPHEN 650 MG RE SUPP: 650.0000 mg | Freq: Four times a day (QID) | RECTAL | Status: DC | PRN

## 2014-12-26 MED ORDER — METOCLOPRAMIDE HCL 5 MG/ML IJ SOLN: 5.0000 mg | Freq: Three times a day (TID) | INTRAMUSCULAR | Status: DC | PRN

## 2014-12-26 MED ORDER — PROPOFOL 10 MG/ML IV BOLUS
INTRAVENOUS | Status: AC
  Filled 2014-12-26: qty 20

## 2014-12-26 MED ORDER — CEFAZOLIN SODIUM 1-5 GM-% IV SOLN
1.0000 g | Freq: Four times a day (QID) | INTRAVENOUS | Status: AC
  Administered 2014-12-27 (×2): 1 g via INTRAVENOUS
  Filled 2014-12-26 (×2): qty 50

## 2014-12-26 MED ORDER — LIDOCAINE HCL (CARDIAC) 20 MG/ML IV SOLN
INTRAVENOUS | Status: DC | PRN
  Administered 2014-12-26: 60 mg via INTRAVENOUS

## 2014-12-26 MED ORDER — GLYCOPYRROLATE 0.2 MG/ML IJ SOLN
INTRAMUSCULAR | Status: AC
  Filled 2014-12-26: qty 3

## 2014-12-26 MED ORDER — LIP MEDEX EX OINT
TOPICAL_OINTMENT | CUTANEOUS | Status: AC
  Administered 2014-12-26: 1
  Filled 2014-12-26: qty 7

## 2014-12-26 MED ORDER — ACETAMINOPHEN 325 MG PO TABS: 650.0000 mg | ORAL_TABLET | Freq: Four times a day (QID) | ORAL | Status: DC | PRN

## 2014-12-26 MED ORDER — CEFAZOLIN SODIUM-DEXTROSE 2-3 GM-% IV SOLR
INTRAVENOUS | Status: DC | PRN
  Administered 2014-12-26: 2 g via INTRAVENOUS

## 2014-12-26 MED ORDER — HYDROMORPHONE HCL 1 MG/ML IJ SOLN
0.2500 mg | INTRAMUSCULAR | Status: DC | PRN
  Administered 2014-12-27 – 2014-12-28 (×3): 0.5 mg via INTRAVENOUS
  Filled 2014-12-26 (×3): qty 1

## 2014-12-26 MED ORDER — ROCURONIUM BROMIDE 100 MG/10ML IV SOLN
INTRAVENOUS | Status: DC | PRN
  Administered 2014-12-26: 30 mg via INTRAVENOUS
  Administered 2014-12-26: 5 mg via INTRAVENOUS

## 2014-12-26 MED ORDER — FERROUS SULFATE 325 (65 FE) MG PO TABS
325.0000 mg | ORAL_TABLET | Freq: Three times a day (TID) | ORAL | Status: DC
  Administered 2014-12-27 – 2014-12-30 (×11): 325 mg via ORAL
  Filled 2014-12-26 (×13): qty 1

## 2014-12-26 MED ORDER — NEOSTIGMINE METHYLSULFATE 10 MG/10ML IV SOLN
INTRAVENOUS | Status: AC
  Filled 2014-12-26: qty 1

## 2014-12-26 MED ORDER — ONDANSETRON HCL 4 MG/2ML IJ SOLN
INTRAMUSCULAR | Status: AC
  Filled 2014-12-26: qty 2

## 2014-12-26 MED ORDER — DEXAMETHASONE SODIUM PHOSPHATE 10 MG/ML IJ SOLN
INTRAMUSCULAR | Status: AC
  Filled 2014-12-26: qty 1

## 2014-12-26 MED ORDER — MENTHOL 3 MG MT LOZG: 1.0000 | LOZENGE | OROMUCOSAL | Status: DC | PRN

## 2014-12-26 MED ORDER — ONDANSETRON HCL 4 MG/2ML IJ SOLN
4.0000 mg | Freq: Four times a day (QID) | INTRAMUSCULAR | Status: DC | PRN
Start: 2014-12-26 — End: 2014-12-30
  Administered 2014-12-27 – 2014-12-28 (×2): 4 mg via INTRAVENOUS
  Filled 2014-12-26 (×2): qty 2

## 2014-12-26 MED ORDER — ONDANSETRON HCL 4 MG PO TABS: 4.0000 mg | ORAL_TABLET | Freq: Four times a day (QID) | ORAL | Status: DC | PRN

## 2014-12-26 MED ORDER — PHENOL 1.4 % MT LIQD: 1.0000 | OROMUCOSAL | Status: DC | PRN

## 2014-12-26 MED ORDER — POLYETHYLENE GLYCOL 3350 17 G PO PACK
17.0000 g | PACK | Freq: Every day | ORAL | Status: DC | PRN
  Administered 2014-12-28: 17 g via ORAL
  Filled 2014-12-26: qty 1

## 2014-12-26 MED ORDER — METHOCARBAMOL 1000 MG/10ML IJ SOLN
500.0000 mg | Freq: Four times a day (QID) | INTRAMUSCULAR | Status: DC | PRN
  Administered 2014-12-26 – 2014-12-30 (×4): 500 mg via INTRAVENOUS
  Filled 2014-12-26 (×7): qty 5

## 2014-12-26 MED ORDER — HYDROCODONE-ACETAMINOPHEN 5-325 MG PO TABS
1.0000 | ORAL_TABLET | Freq: Four times a day (QID) | ORAL | Status: DC | PRN
  Administered 2014-12-27: 2 via ORAL
  Administered 2014-12-27: 1 via ORAL
  Administered 2014-12-28 – 2014-12-29 (×3): 2 via ORAL
  Administered 2014-12-29: 1 via ORAL
  Administered 2014-12-29 – 2014-12-30 (×4): 2 via ORAL
  Filled 2014-12-26 (×2): qty 1
  Filled 2014-12-26 (×8): qty 2

## 2014-12-26 MED ORDER — ASPIRIN EC 325 MG PO TBEC
325.0000 mg | DELAYED_RELEASE_TABLET | Freq: Every day | ORAL | Status: DC
  Administered 2014-12-27 – 2014-12-30 (×4): 325 mg via ORAL
  Filled 2014-12-26 (×5): qty 1

## 2014-12-26 MED ORDER — LACTATED RINGERS IV SOLN
INTRAVENOUS | Status: DC
  Administered 2014-12-27: 01:00:00 via INTRAVENOUS

## 2014-12-26 MED ORDER — PROPOFOL 10 MG/ML IV BOLUS
INTRAVENOUS | Status: DC | PRN
  Administered 2014-12-26: 100 mg via INTRAVENOUS

## 2014-12-26 MED ORDER — FENTANYL CITRATE (PF) 100 MCG/2ML IJ SOLN
INTRAMUSCULAR | Status: DC | PRN
  Administered 2014-12-26: 50 ug via INTRAVENOUS
  Administered 2014-12-26: 25 ug via INTRAVENOUS

## 2014-12-26 SURGICAL SUPPLY — 38 items
BAG SPEC THK2 15X12 ZIP CLS (MISCELLANEOUS) ×1
BAG ZIPLOCK 12X15 (MISCELLANEOUS) ×3 IMPLANT
BIT DRILL CANN LG 4.3MM (BIT) IMPLANT
BNDG GAUZE ELAST 4 BULKY (GAUZE/BANDAGES/DRESSINGS) ×3 IMPLANT
COVER PERINEAL POST (MISCELLANEOUS) ×3 IMPLANT
DRAPE INCISE IOBAN 66X45 STRL (DRAPES) ×3 IMPLANT
DRAPE STERI IOBAN 125X83 (DRAPES) ×3 IMPLANT
DRILL BIT CANN LG 4.3MM (BIT) ×3
DRSG AQUACEL AG ADV 3.5X 4 (GAUZE/BANDAGES/DRESSINGS) ×6 IMPLANT
DRSG AQUACEL AG ADV 3.5X 6 (GAUZE/BANDAGES/DRESSINGS) ×3 IMPLANT
DRSG MEPILEX BORDER 4X12 (GAUZE/BANDAGES/DRESSINGS) ×2 IMPLANT
DURAPREP 26ML APPLICATOR (WOUND CARE) ×3 IMPLANT
ELECT REM PT RETURN 9FT ADLT (ELECTROSURGICAL) ×3
ELECTRODE REM PT RTRN 9FT ADLT (ELECTROSURGICAL) ×1 IMPLANT
GAUZE SPONGE 4X4 12PLY STRL (GAUZE/BANDAGES/DRESSINGS) ×3 IMPLANT
GLOVE BIOGEL PI IND STRL 7.5 (GLOVE) ×1 IMPLANT
GLOVE BIOGEL PI IND STRL 8.5 (GLOVE) ×1 IMPLANT
GLOVE BIOGEL PI INDICATOR 7.5 (GLOVE) ×2
GLOVE BIOGEL PI INDICATOR 8.5 (GLOVE) ×2
GLOVE ECLIPSE 8.0 STRL XLNG CF (GLOVE) IMPLANT
GLOVE ORTHO TXT STRL SZ7.5 (GLOVE) ×6 IMPLANT
GLOVE SURG ORTHO 8.0 STRL STRW (GLOVE) ×3 IMPLANT
GOWN SPEC L3 XXLG W/TWL (GOWN DISPOSABLE) ×6 IMPLANT
GOWN STRL REUS W/TWL LRG LVL3 (GOWN DISPOSABLE) ×3 IMPLANT
GUIDEPIN 3.2X17.5 THRD DISP (PIN) ×4 IMPLANT
HIP FR NAIL LAG SCREW 10.5X110 (Orthopedic Implant) ×3 IMPLANT
KIT BASIN OR (CUSTOM PROCEDURE TRAY) ×3 IMPLANT
MANIFOLD NEPTUNE II (INSTRUMENTS) ×3 IMPLANT
NAIL HIP FRACT 130D 11X180 (Screw) ×2 IMPLANT
PACK GENERAL/GYN (CUSTOM PROCEDURE TRAY) ×3 IMPLANT
POSITIONER SURGICAL ARM (MISCELLANEOUS) ×3 IMPLANT
SCREW BONE CORTICAL 5.0X40 (Screw) ×2 IMPLANT
SCREW LAG HIP FR NAIL 10.5X110 (Orthopedic Implant) IMPLANT
STAPLER VISISTAT 35W (STAPLE) ×2 IMPLANT
SUT VIC AB 1 CT1 27 (SUTURE) ×3
SUT VIC AB 1 CT1 27XBRD ANTBC (SUTURE) ×1 IMPLANT
SUT VIC AB 2-0 CT1 27 (SUTURE) ×6
SUT VIC AB 2-0 CT1 27XBRD (SUTURE) ×2 IMPLANT

## 2014-12-26 NOTE — Anesthesia Procedure Notes (Signed)
Procedure Name: Intubation Date/Time: 12/26/2014 6:41 PM Performed by: Carleene Cooper A Pre-anesthesia Checklist: Patient identified, Timeout performed, Emergency Drugs available, Suction available and Patient being monitored Patient Re-evaluated:Patient Re-evaluated prior to inductionOxygen Delivery Method: Circle system utilized Preoxygenation: Pre-oxygenation with 100% oxygen Intubation Type: IV induction Ventilation: Mask ventilation without difficulty Laryngoscope Size: Mac and 4 Grade View: Grade I Tube type: Oral Tube size: 7.5 mm Number of attempts: 2 Airway Equipment and Method: Stylet Placement Confirmation: ETT inserted through vocal cords under direct vision,  breath sounds checked- equal and bilateral and positive ETCO2 Secured at: 23 cm Tube secured with: Tape Dental Injury: Teeth and Oropharynx as per pre-operative assessment  Comments: DL x 2 with MAC 4. Grade 1 view. First attempt - ETCO2. ETT removed. Pt bagged. Vss. Positive ETCO2 on second attempt. ATOI.

## 2014-12-26 NOTE — Brief Op Note (Signed)
12/25/2014 - 12/26/2014  6:07 PM  PATIENT:  [name]  81 y.o. male  PRE-OPERATIVE DIAGNOSIS:  Comminuted left intertrochantric fracture  POST-OPERATIVE DIAGNOSIS:  Comminuted left intertrochantric fracture  PROCEDURE:  Procedure(s): OPEN REDUCTION INTERNAL FIXATION HIP (Left)  [employer] Affixus IM nail  SURGEON:  Surgeon(s) and Role:    * Paralee Cancel, MD - Primary  ASSISTANTS: surgical team   ANESTHESIA:   general  EBL:  Total I/O In: 0  Out: 300 [Urine:300]  BLOOD ADMINISTERED:none  DRAINS: none   LOCAL MEDICATIONS USED:  NONE  SPECIMEN:  No Specimen  DISPOSITION OF SPECIMEN:  N/A  COUNTS:  YES  TOURNIQUET:  * No tourniquets in log *  DICTATION: .Other Dictation: Dictation Number [id number]  PLAN OF CARE: Admit to inpatient   PATIENT DISPOSITION:  PACU - hemodynamically stable.   Delay start of Pharmacological VTE agent (>24hrs) due to surgical blood loss or risk of bleeding: no

## 2014-12-26 NOTE — H&P (View-Only) (Signed)
Reason for Consult:  Left intertrochanteric femur fracture Referring Physician:   ED Physician  [name] is an 81 y.o. male.  HPI: [name] is a 81 y.o. male who [place] the airport [place] when his "legs gave out" on him when coming out of the bathroom. Patient had immediate pain in his left knee after the fall and inability to bear weight on that leg.  He does have a history of dementia and lives at home with his wife.  Discussion was had between Dr. Alvan Dame and the wife regarding the need for surgery to repair the hip if he wants to be able to walk on the leg again. She appears to understand.   Past Medical History  Diagnosis Date  . Hypertension   . Hypothyroidism   . Diverticulosis of colon   . BPH (benign prostatic hypertrophy)   . Pancreatitis 2011    and gallstone pancreatitis and Ecoli bacteremia  . 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 followed by [name]- observation as of 09/2010  . Rabbit fever 1940  . History of shingles   . Dementia     Past Surgical History  Procedure Laterality Date  . Appendectomy  1959  . Thyroidectomy, partial  1966  . Knee arthroscopy  06/1995    left Dr Derrel Nip  . Joint replacement  04/30/2001    bilateral TKR  . Cholecystectomy  10/2009  . Eye muscle surgery      12/12 had double vision Dr. Juleen China at [facility] History  Problem Relation Age of Onset  . Hypertension Mother   . Stroke Mother   . Alcohol abuse Father   . Cancer Brother     prostate CA  . Heart disease Brother     Social History:  reports that he has never smoked. He has never used smokeless tobacco. He reports that he does not drink alcohol or use illicit drugs.  Allergies:  Allergies  Allergen Reactions  . Sulfonamide Derivatives Other (See Comments)    Childhood allergy  . Valtrex [Valacyclovir Hcl]     NAV      Results for orders placed or performed during the hospital encounter of 12/25/14 (from the  past 48 hour(s))  Basic metabolic panel     Status: Abnormal   Collection Time: 12/25/14  7:51 PM  Result Value Ref Range   Sodium 140 135 - 145 mmol/L   Potassium 4.0 3.5 - 5.1 mmol/L   Chloride 108 101 - 111 mmol/L   CO2 25 22 - 32 mmol/L   Glucose, Bld 117 (H) 65 - 99 mg/dL   BUN 28 (H) 6 - 20 mg/dL   Creatinine, Ser 1.20 0.61 - 1.24 mg/dL   Calcium 8.7 (L) 8.9 - 10.3 mg/dL   GFR calc non Af Amer 54 (L) >60 mL/min   GFR calc Af Amer >60 >60 mL/min    Comment: (NOTE) The eGFR has been calculated using the CKD EPI equation. This calculation has not been validated in all clinical situations. eGFR's persistently <60 mL/min signify possible Chronic Kidney Disease.    Anion gap 7 5 - 15  CBC WITH DIFFERENTIAL     Status: Abnormal   Collection Time: 12/25/14  7:51 PM  Result Value Ref Range   WBC 14.5 (H) 4.0 - 10.5 K/uL   RBC 4.64 4.22 - 5.81 MIL/uL   Hemoglobin 12.5 (L) 13.0 - 17.0 g/dL  HCT 38.1 (L) 39.0 - 52.0 %   MCV 82.1 78.0 - 100.0 fL   MCH 26.9 26.0 - 34.0 pg   MCHC 32.8 30.0 - 36.0 g/dL   RDW 14.4 11.5 - 15.5 %   Platelets 200 150 - 400 K/uL   Neutrophils Relative % 87 %   Neutro Abs 12.7 (H) 1.7 - 7.7 K/uL   Lymphocytes Relative 6 %   Lymphs Abs 0.8 0.7 - 4.0 K/uL   Monocytes Relative 6 %   Monocytes Absolute 0.8 0.1 - 1.0 K/uL   Eosinophils Relative 1 %   Eosinophils Absolute 0.1 0.0 - 0.7 K/uL   Basophils Relative 0 %   Basophils Absolute 0.1 0.0 - 0.1 K/uL  Type and screen     Status: None   Collection Time: 12/25/14  8:08 PM  Result Value Ref Range   ABO/RH(D) O POS    Antibody Screen NEG    Sample Expiration 12/28/2014   ABO/Rh     Status: None   Collection Time: 12/25/14  8:08 PM  Result Value Ref Range   ABO/RH(D) O POS     Dg Knee 1-2 Views Left  12/25/2014   CLINICAL DATA:  Legs gave out at airport, patient fell injuring LEFT knee, history of dementia and knee replacement surgery  EXAM: LEFT KNEE - 1-2 VIEW  COMPARISON:  11/29/2014  FINDINGS:  Diffuse osseous demineralization.  Components of LEFT knee prosthesis in expected positions.  No definite fracture, dislocation or bone destruction.  No periprosthetic lucency or joint effusion.  IMPRESSION: LEFT knee prosthesis and osseous demineralization without acute abnormalities.   Electronically Signed   By: Lavonia Dana M.D.   On: 12/25/2014 21:02   Dg Hip Unilat With Pelvis 2-3 Views Left  12/25/2014   CLINICAL DATA:  EMS from airport where his legs "gave out" and he fell, injuring the left knee. Hx of bilateral TKA and dementia.  EXAM: DG HIP (WITH OR WITHOUT PELVIS) 2-3V LEFT  COMPARISON:  None.  FINDINGS: There is a comminuted displaced fracture of the proximal left femur. The fractures intertrochanteric. There separate fracture components of the lesser greater trochanter. The primary fracture components are displaced by approximately 16 mm, the shaft fracture component displacing laterally and mildly retracting superiorly. Lesser and greater trochanter fracture components are also mildly displaced. There is mild varus angulation.  No other fractures. Hip joint is normally spaced and aligned. Bones are diffusely demineralized.  IMPRESSION: Comminuted displaced intertrochanteric fracture of the proximal left femur.   Electronically Signed   By: Lajean Manes M.D.   On: 12/25/2014 21:00    Review of Systems  Unable to perform ROS Musculoskeletal: Positive for joint pain.  Psychiatric/Behavioral: Positive for memory loss.   Blood pressure 107/76, pulse 86, temperature 98.9 F (37.2 C), temperature source Oral, resp. rate 16, height $RemoveBe'5\' 10"'XhdqyuxXa$  (1.778 m), weight 68.04 kg (150 lb), SpO2 95 %. Physical Exam  Constitutional: He appears well-developed.  HENT:  Head: Normocephalic.  Neck: Neck supple. No JVD present. No tracheal deviation present. No thyromegaly present.  Cardiovascular: Normal rate and intact distal pulses.   Respiratory: Effort normal and breath sounds normal. No respiratory  distress.  GI: Soft. There is no tenderness. There is no guarding.  Musculoskeletal:       Left hip: He exhibits decreased range of motion, decreased strength, tenderness and bony tenderness. He exhibits no deformity and no laceration.  Lymphadenopathy:    He has no cervical adenopathy.  Skin: Skin is  warm and dry.  Psychiatric: Cognition and memory are impaired.    Assessment/Plan: Left intertrochanteric femur fracture   NPO Will obtain consent of ORIF of the left femur fx Will try to keep nurse up to date on timing with the OR, family has been told that it might not be until later today.   Pricilla Loveless 12/26/2014, 8:37 AM

## 2014-12-26 NOTE — Clinical Social Work Note (Signed)
Clinical Social Work Assessment  Patient Details  Name: [name] MRN: [id number] Date of Birth: Maita 03, 1932  Date of referral:  12/26/14               Reason for consult:  Discharge Planning                Permission sought to share information with:    Permission granted to share information::     Name::        Agency::     Relationship::     Contact Information:     Housing/Transportation Living arrangements for the past 2 months:  Single Family Home Source of Information:  Spouse Patient Interpreter Needed:  None Criminal Activity/Legal Involvement Pertinent to Current Situation/Hospitalization:  No - Comment as needed Significant Relationships:  Adult Children, Spouse Lives with:  Spouse Do you feel safe going back to the place where you live?   ([facility] be needed.) Need for family participation in patient care:  Yes (Comment)  Care giving concerns:  SNF placement [facility] be required.   Social Worker assessment / plan: Pt hospitalized on 12/25/14 with a fx of the left femur. Surgery is pending. CSW met with pt / daughter this am  to assist with d/c planning. D/c plans are undetermined at this time. CSW will meet again with pt / family following surgery and PT recommendations.  Employment status:  Retired Nurse, adult PT Recommendations:  Not assessed at this time Information / Referral to community resources:  [place]  Patient/Family's Response to care: Daughters feels SNF placement [facility] be needed.  Patient/Family's Understanding of and Emotional Response to Diagnosis, Current Treatment, and Prognosis:  Pt / daughter are aware of pt's medical status. Pt complains of being in pain and has requested assistance . NSG made aware of pt's request. Pt is irritable due to pain. Both pt / daughter are anxious to have surgery completed. Support and reassurance provided.  Emotional Assessment Appearance:  Appears stated  age Attitude/Demeanor/Rapport:  Other (uncomfortable, irritable) Affect (typically observed):  Frustrated, Irritable Orientation:  Oriented to Self, Oriented to Place, Oriented to  Time, Oriented to Situation Alcohol / Substance use:  Not Applicable Psych involvement (Current and /or in the community):  No (Comment)  Discharge Needs  Concerns to be addressed:  Discharge Planning Concerns Readmission within the last 30 days:  No Current discharge risk:  None Barriers to Discharge:  No Barriers Identified   Luretha Rued, [place] 12/26/2014, 3:01 PM

## 2014-12-26 NOTE — Interval H&P Note (Signed)
History and Physical Interval Note:  12/26/2014 5:45 PM  [name]  has presented today for surgery, with the diagnosis of Left intertrochantric fracture  The various methods of treatment have been discussed with the patient and family. After consideration of risks, benefits and other options for treatment, the patient has consented to  Procedure(s): OPEN REDUCTION INTERNAL FIXATION HIP (Left) as a surgical intervention .  The patient's history has been reviewed, patient examined, no change in status, stable for surgery.  I have reviewed the patient's chart and labs.  Questions were answered to the patient's satisfaction.     Mauri Pole

## 2014-12-26 NOTE — Progress Notes (Signed)
[facility] PROGRESS NOTE  [name] BJY:[id number] DOB: January 22, 1931 DOA: 12/25/2014 PCP: Elsie Stain, MD  Assessment/Plan: Principal Problem:    Femur fracture, left-seen by orthopedics, expected to have ORIF, no cardiac complaints, 2-D echo last year was normal, okay to proceed with surgery from a cardiac standpoint. Place on telemetry postoperatively, DVT prophylaxis per orthopedic recommendations      Fall-rule out underlying causes such as infection, progressive dementia    Leukocytosis-check UA, chest x-ray  Hypothyroidism-continue Synthroid  Dementia-continue Aricept    DVT prophylaxsis per orthopedics  Code Status:      Code Status Orders        Start     Ordered   12/25/14 2149  Full code   Continuous     12/25/14 2149    Advance Directive Documentation        Most Recent Value   Type of Advance Directive  Living will   Pre-existing out of facility DNR order (yellow form or pink MOST form)     "MOST" Form in Place?       Family Communication: family updated about patient's clinical progress Disposition Plan:  As above    Brief narrative: [name] is a 81 y.o. male who suffered a mechanical fall at the airport today when his "legs gave out" on him when coming out of the bathroom. Patient had immediate pain in his left knee after the fall and inability to bear weight on that leg.  Had to get narcan on the ambulance on the way here after 123mcg fentanyl was given and caused oversedation / respiratory depression.  Consultants:  Orthopedics  Procedures:  None  Antibiotics: Anti-infectives    None         HPI/Subjective: Patient is sitting comfortably without any significant pain, daughter states that the patient has declined slowly over the last several months, he had a fall several weeks ago was able to ambulate independently after the fall.  Objective: Filed Vitals:   12/25/14 1936 12/25/14 1936 12/25/14 2202  12/26/14 0646  BP: 172/79 172/79 177/120 107/76  Pulse: 81 79 88 86  Temp: 97.8 F (36.6 C)   98.9 F (37.2 C)  TempSrc: Oral   Oral  Resp: 20 16 18 16   Height:      Weight:      SpO2: 97% 98% 97% 95%    Intake/Output Summary (Last 24 hours) at 12/26/14 1241 Last data filed at 12/26/14 0865  Gross per 24 hour  Intake      0 ml  Output      0 ml  Net      0 ml    Exam:  General: No acute respiratory distress Lungs: Clear to auscultation bilaterally without wheezes or crackles Cardiovascular: Regular rate and rhythm without murmur gallop or rub normal S1 and S2 Abdomen: Nontender, nondistended, soft, bowel sounds positive, no rebound, no ascites, no appreciable mass Extremities: No significant cyanosis, clubbing, or edema bilateral lower extremities     Data Review   Micro Results No results found for this or any previous visit (from the past 240 hour(s)).  Radiology Reports Dg Knee 1-2 Views Left  12/25/2014   CLINICAL DATA:  Legs gave out at airport, patient fell injuring LEFT knee, history of dementia and knee replacement surgery  EXAM: LEFT KNEE - 1-2 VIEW  COMPARISON:  11/29/2014  FINDINGS: Diffuse osseous demineralization.  Components of LEFT knee prosthesis in expected positions.  No  definite fracture, dislocation or bone destruction.  No periprosthetic lucency or joint effusion.  IMPRESSION: LEFT knee prosthesis and osseous demineralization without acute abnormalities.   Electronically Signed   By: Lavonia Dana M.D.   On: 12/25/2014 21:02   Ct Head Wo Contrast  11/29/2014   CLINICAL DATA:  Tripped over shoes and fell. Laceration to left forehead.  EXAM: CT HEAD WITHOUT CONTRAST  CT CERVICAL SPINE WITHOUT CONTRAST  TECHNIQUE: Multidetector CT imaging of the head and cervical spine was performed following the standard protocol without intravenous contrast. Multiplanar CT image reconstructions of the cervical spine were also generated.  COMPARISON:  Head CT 10/02/2013   FINDINGS: CT HEAD FINDINGS  Atrophy and chronic small vessel ischemic change, stable in appearance from prior. Remote lacunar infarcts in bilateral basal ganglia and left thalamus, unchanged. No intracranial hemorrhage, mass effect, or midline shift. No hydrocephalus. The basilar cisterns are patent. No evidence of territorial infarct. No intracranial fluid collection. Left supraorbital soft tissue edema. Calvarium is intact. Mucosal thickening involving left side of sphenoid sinus right greater than left maxillary sinus. Mastoid air cells are well aerated.  CT CERVICAL SPINE FINDINGS  There is no fracture. The dens is intact. There are no jumped or perched facets. Multilevel degenerative change with disc space narrowing from C4-C5 through C7-T1 and associated endplate spurs. Scattered facet arthropathy. No prevertebral soft tissue edema.  IMPRESSION: 1. Advanced but stable chronic small vessel ischemic change and atrophy without acute intracranial abnormality. 2. Chronic sinusitis.  Mild left supraorbital soft tissue edema. 3. Degenerative change in the cervical spine without acute fracture.   Electronically Signed   By: Jeb Levering M.D.   On: 11/29/2014 01:41   Ct Cervical Spine Wo Contrast  11/29/2014   CLINICAL DATA:  Tripped over shoes and fell. Laceration to left forehead.  EXAM: CT HEAD WITHOUT CONTRAST  CT CERVICAL SPINE WITHOUT CONTRAST  TECHNIQUE: Multidetector CT imaging of the head and cervical spine was performed following the standard protocol without intravenous contrast. Multiplanar CT image reconstructions of the cervical spine were also generated.  COMPARISON:  Head CT 10/02/2013  FINDINGS: CT HEAD FINDINGS  Atrophy and chronic small vessel ischemic change, stable in appearance from prior. Remote lacunar infarcts in bilateral basal ganglia and left thalamus, unchanged. No intracranial hemorrhage, mass effect, or midline shift. No hydrocephalus. The basilar cisterns are patent. No evidence  of territorial infarct. No intracranial fluid collection. Left supraorbital soft tissue edema. Calvarium is intact. Mucosal thickening involving left side of sphenoid sinus right greater than left maxillary sinus. Mastoid air cells are well aerated.  CT CERVICAL SPINE FINDINGS  There is no fracture. The dens is intact. There are no jumped or perched facets. Multilevel degenerative change with disc space narrowing from C4-C5 through C7-T1 and associated endplate spurs. Scattered facet arthropathy. No prevertebral soft tissue edema.  IMPRESSION: 1. Advanced but stable chronic small vessel ischemic change and atrophy without acute intracranial abnormality. 2. Chronic sinusitis.  Mild left supraorbital soft tissue edema. 3. Degenerative change in the cervical spine without acute fracture.   Electronically Signed   By: Jeb Levering M.D.   On: 11/29/2014 01:41   Dg Knee Complete 4 Views Left  11/29/2014   CLINICAL DATA:  Trip and fall injury. Left leg pain. Ambulating as per usual.  EXAM: LEFT KNEE - COMPLETE 4+ VIEW  COMPARISON:  None.  FINDINGS: Postoperative changes with left total knee arthroplasty and Patel femoral component. Components appear well seated. Mildly oblique positioning of  the lateral limits evaluation. No evidence of acute fracture or dislocation. No focal bone lesion or bone destruction. No significant effusion.  IMPRESSION: Left total knee arthroplasty. Components appear well seated. No acute bony abnormality suggested.   Electronically Signed   By: Lucienne Capers M.D.   On: 11/29/2014 01:28   Dg Hip Unilat With Pelvis 2-3 Views Left  01/12/15   CLINICAL DATA:  EMS from airport where his legs "gave out" and he fell, injuring the left knee. Hx of bilateral TKA and dementia.  EXAM: DG HIP (WITH OR WITHOUT PELVIS) 2-3V LEFT  COMPARISON:  None.  FINDINGS: There is a comminuted displaced fracture of the proximal left femur. The fractures intertrochanteric. There separate fracture components  of the lesser greater trochanter. The primary fracture components are displaced by approximately 16 mm, the shaft fracture component displacing laterally and mildly retracting superiorly. Lesser and greater trochanter fracture components are also mildly displaced. There is mild varus angulation.  No other fractures. Hip joint is normally spaced and aligned. Bones are diffusely demineralized.  IMPRESSION: Comminuted displaced intertrochanteric fracture of the proximal left femur.   Electronically Signed   By: Lajean Manes M.D.   On: 01/12/2015 21:00     CBC  Recent Labs Lab January 12, 2015 1951  WBC 14.5*  HGB 12.5*  HCT 38.1*  PLT 200  MCV 82.1  MCH 26.9  MCHC 32.8  RDW 14.4  LYMPHSABS 0.8  MONOABS 0.8  EOSABS 0.1  BASOSABS 0.1    Chemistries   Recent Labs Lab January 12, 2015 1951  NA 140  K 4.0  CL 108  CO2 25  GLUCOSE 117*  BUN 28*  CREATININE 1.20  CALCIUM 8.7*   ------------------------------------------------------------------------------------------------------------------ estimated creatinine clearance is 45.6 mL/min (by C-G formula based on Cr of 1.2). ------------------------------------------------------------------------------------------------------------------ No results for input(s): HGBA1C in the last 72 hours. ------------------------------------------------------------------------------------------------------------------ No results for input(s): CHOL, HDL, LDLCALC, TRIG, CHOLHDL, LDLDIRECT in the last 72 hours. ------------------------------------------------------------------------------------------------------------------ No results for input(s): TSH, T4TOTAL, T3FREE, THYROIDAB in the last 72 hours.  Invalid input(s): FREET3 ------------------------------------------------------------------------------------------------------------------ No results for input(s): VITAMINB12, FOLATE, FERRITIN, TIBC, IRON, RETICCTPCT in the last 72 hours.  Coagulation profile No  results for input(s): INR, PROTIME in the last 168 hours.  No results for input(s): DDIMER in the last 72 hours.  Cardiac Enzymes No results for input(s): CKMB, TROPONINI, MYOGLOBIN in the last 168 hours.  Invalid input(s): CK ------------------------------------------------------------------------------------------------------------------ Invalid input(s): POCBNP   CBG: No results for input(s): GLUCAP in the last 168 hours.     Studies: Dg Knee 1-2 Views Left  01/12/15   CLINICAL DATA:  Legs gave out at airport, patient fell injuring LEFT knee, history of dementia and knee replacement surgery  EXAM: LEFT KNEE - 1-2 VIEW  COMPARISON:  11/29/2014  FINDINGS: Diffuse osseous demineralization.  Components of LEFT knee prosthesis in expected positions.  No definite fracture, dislocation or bone destruction.  No periprosthetic lucency or joint effusion.  IMPRESSION: LEFT knee prosthesis and osseous demineralization without acute abnormalities.   Electronically Signed   By: Lavonia Dana M.D.   On: 2015-01-12 21:02   Dg Hip Unilat With Pelvis 2-3 Views Left  01-12-2015   CLINICAL DATA:  EMS from airport where his legs "gave out" and he fell, injuring the left knee. Hx of bilateral TKA and dementia.  EXAM: DG HIP (WITH OR WITHOUT PELVIS) 2-3V LEFT  COMPARISON:  None.  FINDINGS: There is a comminuted displaced fracture of the proximal left femur. The fractures intertrochanteric. There separate  fracture components of the lesser greater trochanter. The primary fracture components are displaced by approximately 16 mm, the shaft fracture component displacing laterally and mildly retracting superiorly. Lesser and greater trochanter fracture components are also mildly displaced. There is mild varus angulation.  No other fractures. Hip joint is normally spaced and aligned. Bones are diffusely demineralized.  IMPRESSION: Comminuted displaced intertrochanteric fracture of the proximal left femur.    Electronically Signed   By: Lajean Manes M.D.   On: 12/25/2014 21:00      Lab Results  Component Value Date   HGBA1C 5.5 07/12/2012   Lab Results  Component Value Date   MICROALBUR 0.2 08/25/2008   LDLCALC 67 09/20/2013   CREATININE 1.20 12/25/2014       Scheduled Meds: . aspirin EC  325 mg Oral Daily  . donepezil  10 mg Oral Daily  . levothyroxine  100 mcg Oral Daily  . pantoprazole  80 mg Oral Daily  . terazosin  5 mg Oral QHS   Continuous Infusions:   Principal Problem:   Femur fracture, left Active Problems:   Fall   Intertrochanteric fracture of left femur   Leukocytosis    Time spent: 45 minutes   [facility] Pager [phone]. If 7PM-7AM, please contact night-coverage at [email or web address], password [facility] 12/26/2014, 12:41 PM  LOS: 1 day

## 2014-12-26 NOTE — Consult Note (Signed)
Reason for Consult:  Left intertrochanteric femur fracture Referring Physician:   ED Physician  [name] is an 81 y.o. male.  HPI: [name] is a 81 y.o. male who [place] airport [place] when his "legs gave out" on him when coming out of the bathroom. Patient had immediate pain in his left knee after the fall and inability to bear weight on that leg.  He does have a history of dementia and lives at home with his wife.  Discussion was had between Dr. Alvan Dame and the wife regarding the need for surgery to repair the hip if he wants to be able to walk on the leg again. She appears to understand.   Past Medical History  Diagnosis Date  . Hypertension   . Hypothyroidism   . Diverticulosis of colon   . BPH (benign prostatic hypertrophy)   . Pancreatitis 2011    and gallstone pancreatitis and Ecoli bacteremia  . 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 followed by [name]- observation as of 09/2010  . Rabbit fever 1940  . History of shingles   . Dementia     Past Surgical History  Procedure Laterality Date  . Appendectomy  1959  . Thyroidectomy, partial  1966  . Knee arthroscopy  06/1995    left Dr Derrel Nip  . Joint replacement  04/30/2001    bilateral TKR  . Cholecystectomy  10/2009  . Eye muscle surgery      12/12 had double vision Dr. Juleen China at [facility] History  Problem Relation Age of Onset  . Hypertension Mother   . Stroke Mother   . Alcohol abuse Father   . Cancer Brother     prostate CA  . Heart disease Brother     Social History:  reports that he has never smoked. He has never used smokeless tobacco. He reports that he does not drink alcohol or use illicit drugs.  Allergies:  Allergies  Allergen Reactions  . Sulfonamide Derivatives Other (See Comments)    Childhood allergy  . Valtrex [Valacyclovir Hcl]     NAV      Results for orders placed or performed during the hospital encounter of 12/25/14 (from the  past 48 hour(s))  Basic metabolic panel     Status: Abnormal   Collection Time: 12/25/14  7:51 PM  Result Value Ref Range   Sodium 140 135 - 145 mmol/L   Potassium 4.0 3.5 - 5.1 mmol/L   Chloride 108 101 - 111 mmol/L   CO2 25 22 - 32 mmol/L   Glucose, Bld 117 (H) 65 - 99 mg/dL   BUN 28 (H) 6 - 20 mg/dL   Creatinine, Ser 1.20 0.61 - 1.24 mg/dL   Calcium 8.7 (L) 8.9 - 10.3 mg/dL   GFR calc non Af Amer 54 (L) >60 mL/min   GFR calc Af Amer >60 >60 mL/min    Comment: (NOTE) The eGFR has been calculated using the CKD EPI equation. This calculation has not been validated in all clinical situations. eGFR's persistently <60 mL/min signify possible Chronic Kidney Disease.    Anion gap 7 5 - 15  CBC WITH DIFFERENTIAL     Status: Abnormal   Collection Time: 12/25/14  7:51 PM  Result Value Ref Range   WBC 14.5 (H) 4.0 - 10.5 K/uL   RBC 4.64 4.22 - 5.81 MIL/uL   Hemoglobin 12.5 (L) 13.0 - 17.0 g/dL  HCT 38.1 (L) 39.0 - 52.0 %   MCV 82.1 78.0 - 100.0 fL   MCH 26.9 26.0 - 34.0 pg   MCHC 32.8 30.0 - 36.0 g/dL   RDW 14.4 11.5 - 15.5 %   Platelets 200 150 - 400 K/uL   Neutrophils Relative % 87 %   Neutro Abs 12.7 (H) 1.7 - 7.7 K/uL   Lymphocytes Relative 6 %   Lymphs Abs 0.8 0.7 - 4.0 K/uL   Monocytes Relative 6 %   Monocytes Absolute 0.8 0.1 - 1.0 K/uL   Eosinophils Relative 1 %   Eosinophils Absolute 0.1 0.0 - 0.7 K/uL   Basophils Relative 0 %   Basophils Absolute 0.1 0.0 - 0.1 K/uL  Type and screen     Status: None   Collection Time: 12/25/14  8:08 PM  Result Value Ref Range   ABO/RH(D) O POS    Antibody Screen NEG    Sample Expiration 12/28/2014   ABO/Rh     Status: None   Collection Time: 12/25/14  8:08 PM  Result Value Ref Range   ABO/RH(D) O POS     Dg Knee 1-2 Views Left  12/25/2014   CLINICAL DATA:  Legs gave out at airport, patient fell injuring LEFT knee, history of dementia and knee replacement surgery  EXAM: LEFT KNEE - 1-2 VIEW  COMPARISON:  11/29/2014  FINDINGS:  Diffuse osseous demineralization.  Components of LEFT knee prosthesis in expected positions.  No definite fracture, dislocation or bone destruction.  No periprosthetic lucency or joint effusion.  IMPRESSION: LEFT knee prosthesis and osseous demineralization without acute abnormalities.   Electronically Signed   By: Lavonia Dana M.D.   On: 12/25/2014 21:02   Dg Hip Unilat With Pelvis 2-3 Views Left  12/25/2014   CLINICAL DATA:  EMS from airport where his legs "gave out" and he fell, injuring the left knee. Hx of bilateral TKA and dementia.  EXAM: DG HIP (WITH OR WITHOUT PELVIS) 2-3V LEFT  COMPARISON:  None.  FINDINGS: There is a comminuted displaced fracture of the proximal left femur. The fractures intertrochanteric. There separate fracture components of the lesser greater trochanter. The primary fracture components are displaced by approximately 16 mm, the shaft fracture component displacing laterally and mildly retracting superiorly. Lesser and greater trochanter fracture components are also mildly displaced. There is mild varus angulation.  No other fractures. Hip joint is normally spaced and aligned. Bones are diffusely demineralized.  IMPRESSION: Comminuted displaced intertrochanteric fracture of the proximal left femur.   Electronically Signed   By: Lajean Manes M.D.   On: 12/25/2014 21:00    Review of Systems  Unable to perform ROS Musculoskeletal: Positive for joint pain.  Psychiatric/Behavioral: Positive for memory loss.   Blood pressure 107/76, pulse 86, temperature 98.9 F (37.2 C), temperature source Oral, resp. rate 16, height $RemoveBe'5\' 10"'[id number]$  (1.778 m), weight 68.04 kg (150 lb), SpO2 95 %. Physical Exam  Constitutional: He appears well-developed.  HENT:  Head: Normocephalic.  Neck: Neck supple. No JVD present. No tracheal deviation present. No thyromegaly present.  Cardiovascular: Normal rate and intact distal pulses.   Respiratory: Effort normal and breath sounds normal. No respiratory  distress.  GI: Soft. There is no tenderness. There is no guarding.  Musculoskeletal:       Left hip: He exhibits decreased range of motion, decreased strength, tenderness and bony tenderness. He exhibits no deformity and no laceration.  Lymphadenopathy:    He has no cervical adenopathy.  Skin: Skin is  warm and dry.  Psychiatric: Cognition and memory are impaired.    Assessment/Plan: Left intertrochanteric femur fracture   NPO Will obtain consent of ORIF of the left femur fx Will try to keep nurse up to date on timing with the OR, family has been told that it might not be until later today.   Pricilla Loveless 12/26/2014, 8:37 AM

## 2014-12-26 NOTE — Progress Notes (Signed)
Initial Nutrition Assessment  DOCUMENTATION CODES:   Not applicable  INTERVENTION:  Ensure Enlive BID per diet advancement   NUTRITION DIAGNOSIS:   Predicted suboptimal nutrient intake related to wound healing, poor appetite as evidenced by estimated needs, per patient/family report.    GOAL:   Patient will meet greater than or equal to 90% of their needs    MONITOR:   Diet advancement, Labs, I & O's, Skin, Weight trends  REASON FOR ASSESSMENT:   Consult Hip fracture protocol  ASSESSMENT:   81 yo male who suffered a mechanical fall after his legs "gave out on him" as he was walking out of the bathroom. Pt had immediate pain in his left knee and the inability to bear weight on that leg. PMH of HTN, Hypothyroidism, Diverticulosis, BPH, Pancreatitis. Pt was receiving a bath when I came to visit. Family gave history. Pt has been experiencing no wt loss, wife reports he has a good appetite normally. Dealing with vomitting as of late his appetite comes and goes with the vomitting. Pt was still active and ambulating prior to fall. Will provide ensure enlive BID following diet advancement to provide extra calories for healing of leg.    Diet Order:  Diet NPO time specified  Skin:  Wound (see comment) (Bruising on arms)  Last BM:  9/22  Height:   Ht Readings from Last 1 Encounters:  12/25/14 5\' 10"  (1.778 m)    Weight:   Wt Readings from Last 1 Encounters:  12/25/14 150 lb (68.04 kg)    Ideal Body Weight:  75.45 kg  BMI:  Body mass index is 21.52 kg/(m^2).  Estimated Nutritional Needs:   Kcal:  1800-2000  Protein:  80-90 grams  Fluid:  >=/ 1.8L  EDUCATION NEEDS:   No education needs identified at this time  Satira Anis. Ward, MS, RD LDN After Hours/Weekend Pager [phone]

## 2014-12-26 NOTE — Transfer of Care (Signed)
Immediate Anesthesia Transfer of Care Note  Patient: [name]  Procedure(s) Performed: Procedure(s): OPEN REDUCTION INTERNAL FIXATION HIP (Left)  Patient Location: PACU  Anesthesia Type:General  Level of Consciousness: awake, alert , oriented and patient cooperative  Airway & Oxygen Therapy: Patient Spontanous Breathing and Patient connected to face mask oxygen  Post-op Assessment: Report given to RN, Post -op Vital signs reviewed and stable and Patient moving all extremities  Post vital signs: Reviewed and stable  Last Vitals:  Filed Vitals:   12/26/14 1400  BP: 118/74  Pulse: 91  Temp: 36.7 C  Resp: 15    Complications: No apparent anesthesia complications

## 2014-12-26 NOTE — Anesthesia Preprocedure Evaluation (Addendum)
Anesthesia Evaluation  Patient identified by MRN, date of birth, ID band Patient awake    Reviewed: Allergy & Precautions, NPO status , Patient's Chart, lab work & pertinent test results  Airway Mallampati: III  TM Distance: >3 FB Neck ROM: Full    Dental  (+) Upper Dentures, Dental Advisory Given   Pulmonary    breath sounds clear to auscultation       Cardiovascular hypertension, + Peripheral Vascular Disease  + dysrhythmias  Rhythm:Regular Rate:Normal     Neuro/Psych PSYCHIATRIC DISORDERS Depression TIA   GI/Hepatic GERD  Medicated,  Endo/Other  Hypothyroidism   Renal/GU      Musculoskeletal   Abdominal   Peds  Hematology   Anesthesia Other Findings   Reproductive/Obstetrics                           Lab Results  Component Value Date   WBC 14.5* 12/25/2014   HGB 12.5* 12/25/2014   HCT 38.1* 12/25/2014   MCV 82.1 12/25/2014   PLT 200 12/25/2014   Lab Results  Component Value Date   CREATININE 1.20 12/25/2014   BUN 28* 12/25/2014   NA 140 12/25/2014   K 4.0 12/25/2014   CL 108 12/25/2014   CO2 25 12/25/2014   Lab Results  Component Value Date   INR 1.01 12/28/2012   INR 0.95 07/11/2012   INR 1.03 10/15/2009   EKG: normal sinus rhythm.   Anesthesia Physical Anesthesia Plan  ASA: III and emergent  Anesthesia Plan: General   Post-op Pain Management:    Induction: Intravenous  Airway Management Planned: Oral ETT  Additional Equipment:   Intra-op Plan:   Post-operative Plan:   Informed Consent: I have reviewed the patients History and Physical, chart, labs and discussed the procedure including the risks, benefits and alternatives for the proposed anesthesia with the patient or authorized representative who has indicated his/her understanding and acceptance.   Dental advisory given  Plan Discussed with: CRNA  Anesthesia Plan Comments:         Anesthesia  Quick Evaluation

## 2014-12-27 DIAGNOSIS — S72142D Displaced intertrochanteric fracture of left femur, subsequent encounter for closed fracture with routine healing: Secondary | ICD-10-CM

## 2014-12-27 LAB — COMPREHENSIVE METABOLIC PANEL
ALT: 13 U/L — AB (ref 17–63)
AST: 22 U/L (ref 15–41)
Albumin: 3 g/dL — ABNORMAL LOW (ref 3.5–5.0)
Alkaline Phosphatase: 52 U/L (ref 38–126)
Anion gap: 8 (ref 5–15)
BILIRUBIN TOTAL: 0.7 mg/dL (ref 0.3–1.2)
BUN: 36 mg/dL — AB (ref 6–20)
CO2: 26 mmol/L (ref 22–32)
CREATININE: 1.11 mg/dL (ref 0.61–1.24)
Calcium: 8.5 mg/dL — ABNORMAL LOW (ref 8.9–10.3)
Chloride: 106 mmol/L (ref 101–111)
GFR calc Af Amer: >60 mL/min (ref >60)
GFR, EST NON AFRICAN AMERICAN: 60 mL/min — AB (ref >60)
Glucose, Bld: 153 mg/dL — ABNORMAL HIGH (ref 65–99)
Potassium: 4.4 mmol/L (ref 3.5–5.1)
Sodium: 140 mmol/L (ref 135–145)
TOTAL PROTEIN: 5.5 g/dL — AB (ref 6.5–8.1)

## 2014-12-27 LAB — CBC
HEMATOCRIT: 29.2 % — AB (ref 39.0–52.0)
HEMOGLOBIN: 9.5 g/dL — AB (ref 13.0–17.0)
MCH: 27.1 pg (ref 26.0–34.0)
MCHC: 32.5 g/dL (ref 30.0–36.0)
MCV: 83.4 fL (ref 78.0–100.0)
Platelets: 175 10*3/uL (ref 150–400)
RBC: 3.5 MIL/uL — AB (ref 4.22–5.81)
RDW: 14.7 % (ref 11.5–15.5)
WBC: 12.5 10*3/uL — AB (ref 4.0–10.5)

## 2014-12-27 LAB — EXPECTORATED SPUTUM ASSESSMENT W REFEX TO RESP CULTURE

## 2014-12-27 LAB — EXPECTORATED SPUTUM ASSESSMENT W GRAM STAIN, RFLX TO RESP C

## 2014-12-27 MED ORDER — DEXTROSE 5 % IV SOLN
1.0000 g | INTRAVENOUS | Status: DC
  Administered 2014-12-27 – 2014-12-29 (×3): 1 g via INTRAVENOUS
  Filled 2014-12-27 (×4): qty 10

## 2014-12-27 MED ORDER — LEVOFLOXACIN IN D5W 750 MG/150ML IV SOLN: 750.0000 mg | INTRAVENOUS | Status: DC

## 2014-12-27 MED ORDER — DEXTROSE 5 % IV SOLN
500.0000 mg | INTRAVENOUS | Status: DC
  Administered 2014-12-27 – 2014-12-29 (×3): 500 mg via INTRAVENOUS
  Filled 2014-12-27 (×4): qty 500

## 2014-12-27 NOTE — Progress Notes (Signed)
[facility] PROGRESS NOTE  [name] MAU:[id number] DOB: 1931-01-31 DOA: 12/25/2014 PCP: Elsie Stain, MD  Assessment/Plan: Principal Problem:    Femur fracture, left-seen by orthopedics, expected to have ORIF, no cardiac complaints, 2-D echo last year was normal, okay to proceed with surgery from a cardiac standpoint. Place on telemetry postoperatively, DVT prophylaxis per orthopedic recommendations      Fall-possibly from pneumonia.     Leukocytosis - possibly from the pneumonia.   Community acquired pneumonia; started on rocephin and zithromax.  Sputum cultures pending. Nasal oxygen as needed to keep sats greater than 90%   Hypothyroidism-continue Synthroid  Dementia-continue Aricept    DVT prophylaxsis per orthopedics  Code Status:      Code Status Orders        Start     Ordered   12/25/14 2149  Full code   Continuous     12/25/14 2149    Advance Directive Documentation        Most Recent Value   Type of Advance Directive  Living will   Pre-existing out of facility DNR order (yellow form or pink MOST form)     "MOST" Form in Place?       Family Communication: family updated about patient's clinical progress Disposition Plan:  As above    Brief narrative: [name] is a 81 y.o. male who [place] "legs gave out" on him when coming out of the bathroom. Patient had immediate pain in his left knee after the fall and inability to bear weight on that leg.   Consultants:  Orthopedics  Procedures:  None  Antibiotics: Anti-infectives    Start     Dose/Rate Route Frequency Ordered Stop   12/27/14 1600  cefTRIAXone (ROCEPHIN) 1 g in dextrose 5 % 50 mL IVPB     1 g 100 mL/hr over 30 Minutes Intravenous Every 24 hours 12/27/14 1441     12/27/14 1500  azithromycin (ZITHROMAX) 500 mg in dextrose 5 % 250 mL IVPB     500 mg 250 mL/hr over 60 Minutes Intravenous Every 24 hours 12/27/14 1429      12/27/14 1400  levofloxacin (LEVAQUIN) IVPB 750 mg  Status:  Discontinued     750 mg 100 mL/hr over 90 Minutes Intravenous Every 24 hours 12/27/14 1349 12/27/14 1429   12/27/14 0200  ceFAZolin (ANCEF) IVPB 1 g/50 mL premix     1 g 100 mL/hr over 30 Minutes Intravenous Every 6 hours 12/26/14 2150 12/27/14 0810         HPI/Subjective: Denies any new complaints. Reports standing at the bedside, with the help of PT. WIFE AT BEDSIDE.    Objective: Filed Vitals:   12/27/14 0040 12/27/14 0250 12/27/14 0600 12/27/14 1525  BP: 135/84 128/82 132/79 142/78  Pulse: 90 98 77 79  Temp: 99.6 F (37.6 C) 98.8 F (37.1 C) 99.4 F (37.4 C) 98.5 F (36.9 C)  TempSrc: Oral Oral Oral Oral  Resp: 16 15 16 16   Height:      Weight:      SpO2: 96% 97% 96% 92%    Intake/Output Summary (Last 24 hours) at 12/27/14 1541 Last data filed at 12/27/14 1533  Gross per 24 hour  Intake   1990 ml  Output    520 ml  Net   1470 ml    Exam:  General: No acute respiratory distress Lungs: Clear to auscultation bilaterally without wheezes  or crackles Cardiovascular: Regular rate and rhythm without murmur gallop or rub normal S1 and S2 Abdomen: Nontender, nondistended, soft, bowel sounds positive, no rebound, no ascites, no appreciable mass Extremities: No significant cyanosis, clubbing, or edema bilateral lower extremities     Data Review   Micro Results Recent Results (from the past 240 hour(s))  Surgical pcr screen     Status: None   Collection Time: 12/26/14  1:04 PM  Result Value Ref Range Status   MRSA, PCR NEGATIVE NEGATIVE Final   Staphylococcus aureus NEGATIVE NEGATIVE Final    Comment:        The Xpert SA Assay (FDA approved for NASAL specimens in patients over 1 years of age), is one component of a comprehensive surveillance program.  Test performance has been validated by [facility] for patients greater than or equal to 35 year old. It is not intended to diagnose infection  nor to guide or monitor treatment.     Radiology Reports Dg Knee 1-2 Views Left  12/25/2014   CLINICAL DATA:  [place] at airport, patient fell injuring LEFT knee, history of dementia and knee replacement surgery  EXAM: LEFT KNEE - 1-2 VIEW  COMPARISON:  11/29/2014  FINDINGS: Diffuse osseous demineralization.  Components of LEFT knee prosthesis in expected positions.  No definite fracture, dislocation or bone destruction.  No periprosthetic lucency or joint effusion.  IMPRESSION: LEFT knee prosthesis and osseous demineralization without acute abnormalities.   Electronically Signed   By: Lavonia Dana M.D.   On: 12/25/2014 21:02   Ct Head Wo Contrast  11/29/2014   CLINICAL DATA:  Tripped over shoes and fell. Laceration to left forehead.  EXAM: CT HEAD WITHOUT CONTRAST  CT CERVICAL SPINE WITHOUT CONTRAST  TECHNIQUE: Multidetector CT imaging of the head and cervical spine was performed following the standard protocol without intravenous contrast. Multiplanar CT image reconstructions of the cervical spine were also generated.  COMPARISON:  Head CT 10/02/2013  FINDINGS: CT HEAD FINDINGS  Atrophy and chronic small vessel ischemic change, stable in appearance from prior. Remote lacunar infarcts in bilateral basal ganglia and left thalamus, unchanged. No intracranial hemorrhage, mass effect, or midline shift. No hydrocephalus. The basilar cisterns are patent. No evidence of territorial infarct. No intracranial fluid collection. Left supraorbital soft tissue edema. Calvarium is intact. Mucosal thickening involving left side of sphenoid sinus right greater than left maxillary sinus. Mastoid air cells are well aerated.  CT CERVICAL SPINE FINDINGS  There is no fracture. The dens is intact. There are no jumped or perched facets. Multilevel degenerative change with disc space narrowing from C4-C5 through C7-T1 and associated endplate spurs. Scattered facet arthropathy. No prevertebral soft tissue edema.  IMPRESSION:  1. Advanced but stable chronic small vessel ischemic change and atrophy without acute intracranial abnormality. 2. Chronic sinusitis.  Mild left supraorbital soft tissue edema. 3. Degenerative change in the cervical spine without acute fracture.   Electronically Signed   By: Jeb Levering M.D.   On: 11/29/2014 01:41   Ct Cervical Spine Wo Contrast  11/29/2014   CLINICAL DATA:  Tripped over shoes and fell. Laceration to left forehead.  EXAM: CT HEAD WITHOUT CONTRAST  CT CERVICAL SPINE WITHOUT CONTRAST  TECHNIQUE: Multidetector CT imaging of the head and cervical spine was performed following the standard protocol without intravenous contrast. Multiplanar CT image reconstructions of the cervical spine were also generated.  COMPARISON:  Head CT 10/02/2013  FINDINGS: CT HEAD FINDINGS  Atrophy and chronic small vessel ischemic change,  stable in appearance from prior. Remote lacunar infarcts in bilateral basal ganglia and left thalamus, unchanged. No intracranial hemorrhage, mass effect, or midline shift. No hydrocephalus. The basilar cisterns are patent. No evidence of territorial infarct. No intracranial fluid collection. Left supraorbital soft tissue edema. Calvarium is intact. Mucosal thickening involving left side of sphenoid sinus right greater than left maxillary sinus. Mastoid air cells are well aerated.  CT CERVICAL SPINE FINDINGS  There is no fracture. The dens is intact. There are no jumped or perched facets. Multilevel degenerative change with disc space narrowing from C4-C5 through C7-T1 and associated endplate spurs. Scattered facet arthropathy. No prevertebral soft tissue edema.  IMPRESSION: 1. Advanced but stable chronic small vessel ischemic change and atrophy without acute intracranial abnormality. 2. Chronic sinusitis.  Mild left supraorbital soft tissue edema. 3. Degenerative change in the cervical spine without acute fracture.   Electronically Signed   By: Jeb Levering M.D.   On: 11/29/2014  01:41   Dg Chest Port 1 View  12/26/2014   CLINICAL DATA:  Leukocytosis.  EXAM: PORTABLE CHEST 1 VIEW  COMPARISON:  09/29/2013  FINDINGS: Left lower lobe airspace opacity noted concerning for pneumonia. Minimal right base atelectasis. Heart is normal size. No effusions. No acute bony abnormality.  IMPRESSION: Left lower lobe opacity concerning for pneumonia.  Right base atelectasis.   Electronically Signed   By: Rolm Baptise M.D.   On: 12/26/2014 13:36   Dg Knee Complete 4 Views Left  11/29/2014   CLINICAL DATA:  Trip and fall injury. Left leg pain. Ambulating as per usual.  EXAM: LEFT KNEE - COMPLETE 4+ VIEW  COMPARISON:  None.  FINDINGS: Postoperative changes with left total knee arthroplasty and Patel femoral component. Components appear well seated. Mildly oblique positioning of the lateral limits evaluation. No evidence of acute fracture or dislocation. No focal bone lesion or bone destruction. No significant effusion.  IMPRESSION: Left total knee arthroplasty. Components appear well seated. No acute bony abnormality suggested.   Electronically Signed   By: Lucienne Capers M.D.   On: 11/29/2014 01:28   Dg C-arm 1-60 Min-no Report  12/26/2014   CLINICAL DATA: surgery   C-ARM 1-60 MINUTES  Fluoroscopy was utilized by the requesting physician.  No radiographic  interpretation.    Dg Hip Operative Unilat With Pelvis Left  12/26/2014   CLINICAL DATA:  ORIF of proximal left femoral fracture  EXAM: DG C-ARM 1-60 MIN-NO REPORT; OPERATIVE LEFT HIP WITH PELVIS  COMPARISON:  None.  FLUOROSCOPY TIME:  Radiation Exposure Index (as provided by the fluoroscopic device): Not available  If the device does not provide the exposure index:  Fluoroscopy Time:  59 seconds  Number of Acquired Images:  3  FINDINGS: A proximal medullary rod is noted with fixation screw. The fracture fragments are in near anatomic alignment.  IMPRESSION: ORIF of left femoral fracture   Electronically Signed   By: Inez Catalina M.D.   On:  12/26/2014 19:55   Dg Hip Unilat With Pelvis 2-3 Views Left  12/25/2014   CLINICAL DATA:  EMS from airport where his legs "gave out" and he fell, injuring the left knee. Hx of bilateral TKA and dementia.  EXAM: DG HIP (WITH OR WITHOUT PELVIS) 2-3V LEFT  COMPARISON:  None.  FINDINGS: There is a comminuted displaced fracture of the proximal left femur. The fractures intertrochanteric. There separate fracture components of the lesser greater trochanter. The primary fracture components are displaced by approximately 16 mm, the shaft fracture component displacing  laterally and mildly retracting superiorly. Lesser and greater trochanter fracture components are also mildly displaced. There is mild varus angulation.  No other fractures. Hip joint is normally spaced and aligned. Bones are diffusely demineralized.  IMPRESSION: Comminuted displaced intertrochanteric fracture of the proximal left femur.   Electronically Signed   By: Lajean Manes M.D.   On: 2015/01/10 21:00     CBC  Recent Labs Lab 2015-01-10 1951 12/27/14 0543  WBC 14.5* 12.5*  HGB 12.5* 9.5*  HCT 38.1* 29.2*  PLT 200 175  MCV 82.1 83.4  MCH 26.9 27.1  MCHC 32.8 32.5  RDW 14.4 14.7  LYMPHSABS 0.8  --   MONOABS 0.8  --   EOSABS 0.1  --   BASOSABS 0.1  --     Chemistries   Recent Labs Lab 10-Jan-2015 1951 12/27/14 0543  NA 140 140  K 4.0 4.4  CL 108 106  CO2 25 26  GLUCOSE 117* 153*  BUN 28* 36*  CREATININE 1.20 1.11  CALCIUM 8.7* 8.5*  AST  --  22  ALT  --  13*  ALKPHOS  --  52  BILITOT  --  0.7   ------------------------------------------------------------------------------------------------------------------ estimated creatinine clearance is 49.3 mL/min (by C-G formula based on Cr of 1.11). ------------------------------------------------------------------------------------------------------------------ No results for input(s): HGBA1C in the last 72  hours. ------------------------------------------------------------------------------------------------------------------ No results for input(s): CHOL, HDL, LDLCALC, TRIG, CHOLHDL, LDLDIRECT in the last 72 hours. ------------------------------------------------------------------------------------------------------------------ No results for input(s): TSH, T4TOTAL, T3FREE, THYROIDAB in the last 72 hours.  Invalid input(s): FREET3 ------------------------------------------------------------------------------------------------------------------ No results for input(s): VITAMINB12, FOLATE, FERRITIN, TIBC, IRON, RETICCTPCT in the last 72 hours.  Coagulation profile No results for input(s): INR, PROTIME in the last 168 hours.  No results for input(s): DDIMER in the last 72 hours.  Cardiac Enzymes No results for input(s): CKMB, TROPONINI, MYOGLOBIN in the last 168 hours.  Invalid input(s): CK ------------------------------------------------------------------------------------------------------------------ Invalid input(s): POCBNP   CBG: No results for input(s): GLUCAP in the last 168 hours.     Studies: Dg Knee 1-2 Views Left  2015/01/10   CLINICAL DATA:  Legs gave out at airport, patient fell injuring LEFT knee, history of dementia and knee replacement surgery  EXAM: LEFT KNEE - 1-2 VIEW  COMPARISON:  11/29/2014  FINDINGS: Diffuse osseous demineralization.  Components of LEFT knee prosthesis in expected positions.  No definite fracture, dislocation or bone destruction.  No periprosthetic lucency or joint effusion.  IMPRESSION: LEFT knee prosthesis and osseous demineralization without acute abnormalities.   Electronically Signed   By: Lavonia Dana M.D.   On: 10-Jan-2015 21:02   Dg Chest Port 1 View  12/26/2014   CLINICAL DATA:  Leukocytosis.  EXAM: PORTABLE CHEST 1 VIEW  COMPARISON:  09/29/2013  FINDINGS: Left lower lobe airspace opacity noted concerning for pneumonia. Minimal right base  atelectasis. Heart is normal size. No effusions. No acute bony abnormality.  IMPRESSION: Left lower lobe opacity concerning for pneumonia.  Right base atelectasis.   Electronically Signed   By: Rolm Baptise M.D.   On: 12/26/2014 13:36   Dg C-arm 1-60 Min-no Report  12/26/2014   CLINICAL DATA: surgery   C-ARM 1-60 MINUTES  Fluoroscopy was utilized by the requesting physician.  No radiographic  interpretation.    Dg Hip Operative Unilat With Pelvis Left  12/26/2014   CLINICAL DATA:  ORIF of proximal left femoral fracture  EXAM: DG C-ARM 1-60 MIN-NO REPORT; OPERATIVE LEFT HIP WITH PELVIS  COMPARISON:  None.  FLUOROSCOPY TIME:  Radiation Exposure Index (as  provided by the fluoroscopic device): Not available  If the device does not provide the exposure index:  Fluoroscopy Time:  59 seconds  Number of Acquired Images:  3  FINDINGS: A proximal medullary rod is noted with fixation screw. The fracture fragments are in near anatomic alignment.  IMPRESSION: ORIF of left femoral fracture   Electronically Signed   By: Inez Catalina M.D.   On: 12/26/2014 19:55   Dg Hip Unilat With Pelvis 2-3 Views Left  12/25/2014   CLINICAL DATA:  EMS from airport where his legs "gave out" and he fell, injuring the left knee. Hx of bilateral TKA and dementia.  EXAM: DG HIP (WITH OR WITHOUT PELVIS) 2-3V LEFT  COMPARISON:  None.  FINDINGS: There is a comminuted displaced fracture of the proximal left femur. The fractures intertrochanteric. There separate fracture components of the lesser greater trochanter. The primary fracture components are displaced by approximately 16 mm, the shaft fracture component displacing laterally and mildly retracting superiorly. Lesser and greater trochanter fracture components are also mildly displaced. There is mild varus angulation.  No other fractures. Hip joint is normally spaced and aligned. Bones are diffusely demineralized.  IMPRESSION: Comminuted displaced intertrochanteric fracture of the proximal  left femur.   Electronically Signed   By: Lajean Manes M.D.   On: 12/25/2014 21:00      Lab Results  Component Value Date   HGBA1C 5.5 07/12/2012   Lab Results  Component Value Date   MICROALBUR 0.2 08/25/2008   LDLCALC 67 09/20/2013   CREATININE 1.11 12/27/2014       Scheduled Meds: . aspirin EC  325 mg Oral Q breakfast  . azithromycin  500 mg Intravenous Q24H  . cefTRIAXone (ROCEPHIN)  IV  1 g Intravenous Q24H  . docusate sodium  100 mg Oral BID  . donepezil  10 mg Oral Daily  . ferrous sulfate  325 mg Oral TID PC  . levothyroxine  100 mcg Oral Daily  . pantoprazole  80 mg Oral Daily  . terazosin  5 mg Oral QHS   Continuous Infusions: . lactated ringers 125 mL/hr at 12/27/14 0041    Principal Problem:   Femur fracture, left Active Problems:   Fall   Intertrochanteric fracture of left femur   Leukocytosis    Time spent: 25 minutes   Channin Agustin  [facility] Pager [phone] If 7PM-7AM, please contact night-coverage at [email or web address], password [facility] 12/27/2014, 3:41 PM  LOS: 2 days

## 2014-12-27 NOTE — Progress Notes (Signed)
ANTIBIOTIC CONSULT NOTE - INITIAL  Pharmacy Consult for Ceftriaxone Indication: pneumonia  Allergies  Allergen Reactions  . Sulfonamide Derivatives Other (See Comments)    Childhood allergy  . Valtrex [Valacyclovir Hcl]     NAV   Patient Measurements: Height: 5\' 10"  (177.8 cm) Weight: 150 lb (68.04 kg) IBW/kg (Calculated) : 73  Vital Signs: Temp: 99.4 F (37.4 C) (09/24 0600) Temp Source: Oral (09/24 0600) BP: 132/79 mmHg (09/24 0600) Pulse Rate: 77 (09/24 0600) Intake/Output from previous day: 09/23 0701 - 09/24 0700 In: [place] [P.O.:120; I.V.:1600] Out: 670 [Urine:570; Blood:100] Intake/Output from this shift: Total I/O In: 270 [P.O.:120; I.V.:150] Out: -   Labs:  Recent Labs  12/25/14 1951 12/27/14 0543  WBC 14.5* 12.5*  HGB 12.5* 9.5*  PLT 200 175  CREATININE 1.20 1.11   Estimated Creatinine Clearance: 49.3 mL/min (by C-G formula based on Cr of 1.11). No results for input(s): VANCOTROUGH, VANCOPEAK, VANCORANDOM, GENTTROUGH, GENTPEAK, GENTRANDOM, TOBRATROUGH, TOBRAPEAK, TOBRARND, AMIKACINPEAK, AMIKACINTROU, AMIKACIN in the last 72 hours.   Microbiology: Recent Results (from the past 720 hour(s))  Surgical pcr screen     Status: None   Collection Time: 12/26/14  1:04 PM  Result Value Ref Range Status   MRSA, PCR NEGATIVE NEGATIVE Final   Staphylococcus aureus NEGATIVE NEGATIVE Final    Comment:        The Xpert SA Assay (FDA approved for NASAL specimens in patients over 1 years of age), is one component of a comprehensive surveillance program.  Test performance has been validated by [facility] for patients greater than or equal to 79 year old. It is not intended to diagnose infection nor to guide or monitor treatment.    Medical History: Past Medical History  Diagnosis Date  . Hypertension   . Hypothyroidism   . Diverticulosis of colon   . BPH (benign prostatic hypertrophy)   . Pancreatitis 2011    and gallstone pancreatitis and Ecoli  bacteremia  . 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 followed by [name]- observation as of 09/2010  . Rabbit fever 1940  . History of shingles   . Dementia    Medications:  Scheduled:  . aspirin EC  325 mg Oral Q breakfast  . azithromycin  500 mg Intravenous Q24H  . docusate sodium  100 mg Oral BID  . donepezil  10 mg Oral Daily  . ferrous sulfate  325 mg Oral TID PC  . levothyroxine  100 mcg Oral Daily  . pantoprazole  80 mg Oral Daily  . terazosin  5 mg Oral QHS   Anti-infectives    Start     Dose/Rate Route Frequency Ordered Stop   12/27/14 1430  azithromycin (ZITHROMAX) 500 mg in dextrose 5 % 250 mL IVPB     500 mg 250 mL/hr over 60 Minutes Intravenous Every 24 hours 12/27/14 1429     12/27/14 1400  levofloxacin (LEVAQUIN) IVPB 750 mg  Status:  Discontinued     750 mg 100 mL/hr over 90 Minutes Intravenous Every 24 hours 12/27/14 1349 12/27/14 1429   12/27/14 0200  ceFAZolin (ANCEF) IVPB 1 g/50 mL premix     1 g 100 mL/hr over 30 Minutes Intravenous Every 6 hours 12/26/14 2150 12/27/14 0810     Assessment: 81 yoM s/p hip fx repair due to fall. CXray with LLL opacity, rule out PNA. Azithromycin and Ceftriaxone ordered for CAP. Pharmacy dosing requested for Ceftriaxone, no dosing adjustment needed for either  antibiotic as they are not renally excreted.  Plan:   Ceftriaxone 1gm q24  Azithromycin 500mg  daily appropriate  Pharmacy will sign off protocol  Thank you,  Minda Ditto PharmD Pager [phone] 12/27/2014, 2:38 PM

## 2014-12-27 NOTE — Anesthesia Postprocedure Evaluation (Signed)
  Anesthesia Post-op Note  Patient: [name]  Procedure(s) Performed: Procedure(s): OPEN REDUCTION INTERNAL FIXATION HIP (Left)  Patient Location: PACU  Anesthesia Type:General  Level of Consciousness: awake and alert   Airway and Oxygen Therapy: Patient Spontanous Breathing  Post-op Pain: mild  Post-op Assessment: Post-op Vital signs reviewed              Post-op Vital Signs: Reviewed and stable  Last Vitals:  Filed Vitals:   12/26/14 2336  BP: 145/84  Pulse: 99  Temp: 37.2 C  Resp: 16    Complications: No apparent anesthesia complications

## 2014-12-27 NOTE — Progress Notes (Addendum)
Subjective: 1 Day Post-Op Procedure(s) (LRB): OPEN REDUCTION INTERNAL FIXATION HIP (Left) Patient reports pain as moderate.   C/o spasms L leg with motion Seen by myself and Dr. Tonita Cong  Objective: Vital signs in last 24 hours: Temp:  [97.7 F (36.5 C)-99.6 F (37.6 C)] 99.4 F (37.4 C) (09/24 0600) Pulse Rate:  [77-118] 77 (09/24 0600) Resp:  [15-21] 16 (09/24 0600) BP: (118-148)/(74-93) 132/79 mmHg (09/24 0600) SpO2:  [87 %-98 %] 96 % (09/24 0600)  Intake/Output from previous day: 09/23 0701 - 09/24 0700 In: [place] [P.O.:120; I.V.:1600] Out: 670 [Urine:570; Blood:100] Intake/Output this shift: Total I/O In: 120 [P.O.:120] Out: -    Recent Labs  12/25/14 1951 12/27/14 0543  HGB 12.5* 9.5*    Recent Labs  12/25/14 1951 12/27/14 0543  WBC 14.5* 12.5*  RBC 4.64 3.50*  HCT 38.1* 29.2*  PLT 200 175    Recent Labs  12/25/14 1951 12/27/14 0543  NA 140 140  K 4.0 4.4  CL 108 106  CO2 25 26  BUN 28* 36*  CREATININE 1.20 1.11  GLUCOSE 117* 153*  CALCIUM 8.7* 8.5*   No results for input(s): LABPT, INR in the last 72 hours.  Neurologically intact ABD soft Neurovascular intact Sensation intact distally Intact pulses distally Dorsiflexion/Plantar flexion intact Incision: dressing C/D/I and mild drainage proximal aspect of dressing No cellulitis present Compartment soft no sign of DVT  Assessment/Plan: 1 Day Post-Op Procedure(s) (LRB): OPEN REDUCTION INTERNAL FIXATION HIP (Left) Advance diet Up with therapy D/C IV fluids  PWB LLE 50%  [name], Christopher M. 12/27/2014, 8:22 AM

## 2014-12-27 NOTE — Evaluation (Signed)
Physical Therapy Evaluation Patient Details Name: [name] MRN: [id number] DOB: 12/15/1930 Today's Date: 12/27/2014   History of Present Illness  s/p  ORIF L hip; PMHx: dementia  Clinical Impression  Pt admitted with above diagnosis. Pt currently with functional limitations due to the deficits listed below (see PT Problem List).  Pt will benefit from skilled PT to increase their independence and safety with mobility to allow discharge to the venue listed below.  Pt easily agitated at time of eval, states he is going to "throw everyone out"; will continue to follow and attempt to progress mobility     Follow Up Recommendations SNF;Supervision/Assistance - 24 hour    Equipment Recommendations  None recommended by PT    Recommendations for Other Services       Precautions / Restrictions Precautions Precautions: Fall Restrictions LLE Weight Bearing: Partial weight bearing LLE Partial Weight Bearing Percentage or Pounds: 50%      Mobility  Bed Mobility Overal bed mobility: Needs Assistance;+2 for physical assistance Bed Mobility: Supine to Sit;Sit to Supine     Supine to sit: +2 for physical assistance;Max assist Sit to supine: Total assist   General bed mobility comments: bed pad utiliazed to scoot in supine, assist with trunk and LEs; multi-modal cues throughout for technique, self assist  Transfers Overall transfer level: Needs assistance Equipment used: Rolling walker (2 wheeled) Transfers: Sit to/from Stand           General transfer comment: attmepted but unable to to stand with +2 assist, pt unwilling to WB on LEs d/t pain with movement  Ambulation/Gait                Stairs            Wheelchair Mobility    Modified Rankin (Stroke Patients Only)       Balance Overall balance assessment: Needs assistance;History of Falls Sitting-balance support: Feet supported;Bilateral upper extremity supported Sitting balance-Leahy Scale: Fair    Postural control: Posterior lean                                   Pertinent Vitals/Pain Pain Assessment: Faces Faces Pain Scale: Hurts even more Pain Location: L hip Pain Descriptors / Indicators: Grimacing;Guarding Pain Intervention(s): Limited activity within patient's tolerance;Monitored during session;Repositioned;Premedicated before session;Ice applied    Home Living Family/patient expects to be discharged to:: Skilled nursing facility                      Prior Function Level of Independence: Independent with assistive device(s)         Comments: uses cane     Hand Dominance        Extremity/Trunk Assessment   Upper Extremity Assessment: Defer to OT evaluation           Lower Extremity Assessment: LLE deficits/detail         Communication   Communication: No difficulties  Cognition Arousal/Alertness: Awake/alert Behavior During Therapy: Agitated (easily agitated) Overall Cognitive Status: History of cognitive impairments - at baseline Area of Impairment: Following commands;Problem solving;Safety/judgement;Memory     Memory: Decreased recall of precautions Following Commands: Follows one step commands inconsistently Safety/Judgement: Decreased awareness of safety;Decreased awareness of deficits   Problem Solving: Difficulty sequencing;Requires verbal cues;Requires tactile cues      General Comments      Exercises General Exercises - Lower Extremity Heel Slides: AAROM;PROM;Left;5 reps  Assessment/Plan    PT Assessment Patient needs continued PT services  PT Diagnosis Difficulty walking   PT Problem List Decreased strength;Decreased range of motion;Decreased activity tolerance;Decreased mobility;Decreased balance;Decreased knowledge of use of DME;Decreased cognition;Decreased safety awareness;Decreased knowledge of precautions  PT Treatment Interventions DME instruction;Gait training;Functional mobility  training;Therapeutic activities;Patient/family education;Therapeutic exercise   PT Goals (Current goals can be found in the Care Plan section) Acute Rehab PT Goals PT Goal Formulation: Patient unable to participate in goal setting Time For Goal Achievement: 01/03/15 Potential to Achieve Goals: Good    Frequency Min 3X/week   Barriers to discharge        Co-evaluation               End of Session   Activity Tolerance: Patient limited by pain Patient left: in bed;with call bell/phone within reach;with family/visitor present Nurse Communication: Mobility status         Time: 7903-8333 PT Time Calculation (min) (ACUTE ONLY): 28 min   Charges:   PT Evaluation $Initial PT Evaluation Tier I: 1 Procedure PT Treatments $Therapeutic Activity: 8-22 mins   PT G Codes:        WILLIAMS,TARA Jan 19, 2015, 2:20 PM

## 2014-12-28 LAB — BASIC METABOLIC PANEL
Anion gap: 7 (ref 5–15)
BUN: 37 mg/dL — AB (ref 6–20)
CALCIUM: 8.1 mg/dL — AB (ref 8.9–10.3)
CO2: 26 mmol/L (ref 22–32)
Chloride: 102 mmol/L (ref 101–111)
Creatinine, Ser: 1 mg/dL (ref 0.61–1.24)
GFR calc Af Amer: >60 mL/min (ref >60)
GLUCOSE: 124 mg/dL — AB (ref 65–99)
Potassium: 4.2 mmol/L (ref 3.5–5.1)
Sodium: 135 mmol/L (ref 135–145)

## 2014-12-28 LAB — CBC
HCT: 26.1 % — ABNORMAL LOW (ref 39.0–52.0)
Hemoglobin: 8.5 g/dL — ABNORMAL LOW (ref 13.0–17.0)
MCH: 27.2 pg (ref 26.0–34.0)
MCHC: 32.6 g/dL (ref 30.0–36.0)
MCV: 83.7 fL (ref 78.0–100.0)
PLATELETS: 161 10*3/uL (ref 150–400)
RBC: 3.12 MIL/uL — ABNORMAL LOW (ref 4.22–5.81)
RDW: 14.5 % (ref 11.5–15.5)
WBC: 12.6 10*3/uL — ABNORMAL HIGH (ref 4.0–10.5)

## 2014-12-28 MED ORDER — HYDROCODONE-ACETAMINOPHEN 5-325 MG PO TABS: 1.0000 | ORAL_TABLET | Freq: Four times a day (QID) | ORAL | Status: DC | PRN

## 2014-12-28 NOTE — Progress Notes (Addendum)
[facility] PROGRESS NOTE  [name] WJX:[id number] DOB: December 23, 1930 DOA: 12/25/2014 PCP: Elsie Stain, MD  Assessment/Plan: Principal Problem:    Femur fracture, left, status post open reduction and internal fixation, day #2 postop, followed by Dr. Wynelle Link.-seen by orthopedics, expected to have ORIF, no cardiac complaints, 2-D echo last year was normal, continue telemetry postoperatively, DVT prophylaxis per orthopedic recommendations, on aspirin 325 mg per day. DC Foley   Acute blood loss anemia-hemoglobin drop from 12.5> 8.5. Continue to monitor. Will transfuse if hemoglobin drops below 8    Fall-possibly from pneumonia and weakness.     Leukocytosis - continue Rocephin azithromycin  Community acquired pneumonia; started on rocephin and zithromax. Day #2 Sputum cultures pending. Nasal oxygen as needed to keep sats greater than 90%   Hypothyroidism-continue Synthroid  Dementia-continue Aricept    DVT prophylaxsis per orthopedics-aspirin 325 mg a day  Code Status:      Code Status Orders        Start     Ordered   12/25/14 2149  Full code   Continuous     12/25/14 2149    Advance Directive Documentation        Most Recent Value   Type of Advance Directive  Living will   Pre-existing out of facility DNR order (yellow form or pink MOST form)     "MOST" Form in Place?       Family Communication: family updated about patient's clinical progress Disposition Plan:  SNF, social work consultation will be placed   Brief narrative: [name] is a 81 y.o. male who [place] "legs gave out" on him when coming out of the bathroom. Patient had immediate pain in his left knee after the fall and inability to bear weight on that leg.   Consultants:  Orthopedics  Procedures:  None  Antibiotics: Anti-infectives    Start     Dose/Rate Route Frequency Ordered Stop   12/27/14 1600  cefTRIAXone (ROCEPHIN) 1 g in  dextrose 5 % 50 mL IVPB     1 g 100 mL/hr over 30 Minutes Intravenous Every 24 hours 12/27/14 1441     12/27/14 1500  azithromycin (ZITHROMAX) 500 mg in dextrose 5 % 250 mL IVPB     500 mg 250 mL/hr over 60 Minutes Intravenous Every 24 hours 12/27/14 1429     12/27/14 1400  levofloxacin (LEVAQUIN) IVPB 750 mg  Status:  Discontinued     750 mg 100 mL/hr over 90 Minutes Intravenous Every 24 hours 12/27/14 1349 12/27/14 1429   12/27/14 0200  ceFAZolin (ANCEF) IVPB 1 g/50 mL premix     1 g 100 mL/hr over 30 Minutes Intravenous Every 6 hours 12/26/14 2150 12/27/14 0810         HPI/Subjective: Patient is well, and has had no acute complaints or problems other than pain in the left leg   Objective: Filed Vitals:   12/27/14 1525 12/27/14 2100 12/28/14 0040 12/28/14 0700  BP: 142/78 122/83  128/78  Pulse: 79 90  84  Temp: 98.5 F (36.9 C) 99.3 F (37.4 C) 98.5 F (36.9 C) 98.5 F (36.9 C)  TempSrc: Oral Oral  Oral  Resp: 16 18  16   Height:      Weight:      SpO2: 92% 91%  96%    Intake/Output Summary (Last 24 hours) at 12/28/14 1003 Last data filed at 12/28/14 0720  Gross  per 24 hour  Intake   1615 ml  Output    626 ml  Net    989 ml    Exam:  General: No acute respiratory distress Lungs: Clear to auscultation bilaterally without wheezes or crackles Cardiovascular: Regular rate and rhythm without murmur gallop or rub normal S1 and S2 Abdomen: Nontender, nondistended, soft, bowel sounds positive, no rebound, no ascites, no appreciable mass Extremities: No significant cyanosis, clubbing, or edema bilateral lower extremities Motor Function - intact, moving foot and toes well on exam.     Data Review   Micro Results Recent Results (from the past 240 hour(s))  Surgical pcr screen     Status: None   Collection Time: 12/26/14  1:04 PM  Result Value Ref Range Status   MRSA, PCR NEGATIVE NEGATIVE Final   Staphylococcus aureus NEGATIVE NEGATIVE Final    Comment:         The Xpert SA Assay (FDA approved for NASAL specimens in patients over 65 years of age), is one component of a comprehensive surveillance program.  Test performance has been validated by [facility] for patients greater than or equal to 28 year old. It is not intended to diagnose infection nor to guide or monitor treatment.   Culture, expectorated sputum-assessment     Status: None   Collection Time: 12/27/14  5:00 PM  Result Value Ref Range Status   Specimen Description SPUTUM  Final   Special Requests NONE  Final   Sputum evaluation   Final    THIS SPECIMEN IS ACCEPTABLE. RESPIRATORY CULTURE REPORT TO FOLLOW.   Report Status 12/27/2014 FINAL  Final    Radiology Reports Dg Knee 1-2 Views Left  12/25/2014   CLINICAL DATA:  [place] at airport, patient fell injuring LEFT knee, history of dementia and knee replacement surgery  EXAM: LEFT KNEE - 1-2 VIEW  COMPARISON:  11/29/2014  FINDINGS: Diffuse osseous demineralization.  Components of LEFT knee prosthesis in expected positions.  No definite fracture, dislocation or bone destruction.  No periprosthetic lucency or joint effusion.  IMPRESSION: LEFT knee prosthesis and osseous demineralization without acute abnormalities.   Electronically Signed   By: Lavonia Dana M.D.   On: 12/25/2014 21:02   Ct Head Wo Contrast  11/29/2014   CLINICAL DATA:  Tripped over shoes and fell. Laceration to left forehead.  EXAM: CT HEAD WITHOUT CONTRAST  CT CERVICAL SPINE WITHOUT CONTRAST  TECHNIQUE: Multidetector CT imaging of the head and cervical spine was performed following the standard protocol without intravenous contrast. Multiplanar CT image reconstructions of the cervical spine were also generated.  COMPARISON:  Head CT 10/02/2013  FINDINGS: CT HEAD FINDINGS  Atrophy and chronic small vessel ischemic change, stable in appearance from prior. Remote lacunar infarcts in bilateral basal ganglia and left thalamus, unchanged. No intracranial hemorrhage,  mass effect, or midline shift. No hydrocephalus. The basilar cisterns are patent. No evidence of territorial infarct. No intracranial fluid collection. Left supraorbital soft tissue edema. Calvarium is intact. Mucosal thickening involving left side of sphenoid sinus right greater than left maxillary sinus. Mastoid air cells are well aerated.  CT CERVICAL SPINE FINDINGS  There is no fracture. The dens is intact. There are no jumped or perched facets. Multilevel degenerative change with disc space narrowing from C4-C5 through C7-T1 and associated endplate spurs. Scattered facet arthropathy. No prevertebral soft tissue edema.  IMPRESSION: 1. Advanced but stable chronic small vessel ischemic change and atrophy without acute intracranial abnormality. 2. Chronic sinusitis.  Mild  left supraorbital soft tissue edema. 3. Degenerative change in the cervical spine without acute fracture.   Electronically Signed   By: Jeb Levering M.D.   On: 11/29/2014 01:41   Ct Cervical Spine Wo Contrast  11/29/2014   CLINICAL DATA:  Tripped over shoes and fell. Laceration to left forehead.  EXAM: CT HEAD WITHOUT CONTRAST  CT CERVICAL SPINE WITHOUT CONTRAST  TECHNIQUE: Multidetector CT imaging of the head and cervical spine was performed following the standard protocol without intravenous contrast. Multiplanar CT image reconstructions of the cervical spine were also generated.  COMPARISON:  Head CT 10/02/2013  FINDINGS: CT HEAD FINDINGS  Atrophy and chronic small vessel ischemic change, stable in appearance from prior. Remote lacunar infarcts in bilateral basal ganglia and left thalamus, unchanged. No intracranial hemorrhage, mass effect, or midline shift. No hydrocephalus. The basilar cisterns are patent. No evidence of territorial infarct. No intracranial fluid collection. Left supraorbital soft tissue edema. Calvarium is intact. Mucosal thickening involving left side of sphenoid sinus right greater than left maxillary sinus. Mastoid  air cells are well aerated.  CT CERVICAL SPINE FINDINGS  There is no fracture. The dens is intact. There are no jumped or perched facets. Multilevel degenerative change with disc space narrowing from C4-C5 through C7-T1 and associated endplate spurs. Scattered facet arthropathy. No prevertebral soft tissue edema.  IMPRESSION: 1. Advanced but stable chronic small vessel ischemic change and atrophy without acute intracranial abnormality. 2. Chronic sinusitis.  Mild left supraorbital soft tissue edema. 3. Degenerative change in the cervical spine without acute fracture.   Electronically Signed   By: Jeb Levering M.D.   On: 11/29/2014 01:41   Dg Chest Port 1 View  12/26/2014   CLINICAL DATA:  Leukocytosis.  EXAM: PORTABLE CHEST 1 VIEW  COMPARISON:  09/29/2013  FINDINGS: Left lower lobe airspace opacity noted concerning for pneumonia. Minimal right base atelectasis. Heart is normal size. No effusions. No acute bony abnormality.  IMPRESSION: Left lower lobe opacity concerning for pneumonia.  Right base atelectasis.   Electronically Signed   By: Rolm Baptise M.D.   On: 12/26/2014 13:36   Dg Knee Complete 4 Views Left  11/29/2014   CLINICAL DATA:  Trip and fall injury. Left leg pain. Ambulating as per usual.  EXAM: LEFT KNEE - COMPLETE 4+ VIEW  COMPARISON:  None.  FINDINGS: Postoperative changes with left total knee arthroplasty and Patel femoral component. Components appear well seated. Mildly oblique positioning of the lateral limits evaluation. No evidence of acute fracture or dislocation. No focal bone lesion or bone destruction. No significant effusion.  IMPRESSION: Left total knee arthroplasty. Components appear well seated. No acute bony abnormality suggested.   Electronically Signed   By: Lucienne Capers M.D.   On: 11/29/2014 01:28   Dg C-arm 1-60 Min-no Report  12/26/2014   CLINICAL DATA: surgery   C-ARM 1-60 MINUTES  Fluoroscopy was utilized by the requesting physician.  No radiographic   interpretation.    Dg Hip Operative Unilat With Pelvis Left  12/26/2014   CLINICAL DATA:  ORIF of proximal left femoral fracture  EXAM: DG C-ARM 1-60 MIN-NO REPORT; OPERATIVE LEFT HIP WITH PELVIS  COMPARISON:  None.  FLUOROSCOPY TIME:  Radiation Exposure Index (as provided by the fluoroscopic device): Not available  If the device does not provide the exposure index:  Fluoroscopy Time:  59 seconds  Number of Acquired Images:  3  FINDINGS: A proximal medullary rod is noted with fixation screw. The fracture fragments are in near anatomic  alignment.  IMPRESSION: ORIF of left femoral fracture   Electronically Signed   By: Inez Catalina M.D.   On: 12/26/2014 19:55   Dg Hip Unilat With Pelvis 2-3 Views Left  12/25/2014   CLINICAL DATA:  EMS from airport where his legs "gave out" and he fell, injuring the left knee. Hx of bilateral TKA and dementia.  EXAM: DG HIP (WITH OR WITHOUT PELVIS) 2-3V LEFT  COMPARISON:  None.  FINDINGS: There is a comminuted displaced fracture of the proximal left femur. The fractures intertrochanteric. There separate fracture components of the lesser greater trochanter. The primary fracture components are displaced by approximately 16 mm, the shaft fracture component displacing laterally and mildly retracting superiorly. Lesser and greater trochanter fracture components are also mildly displaced. There is mild varus angulation.  No other fractures. Hip joint is normally spaced and aligned. Bones are diffusely demineralized.  IMPRESSION: Comminuted displaced intertrochanteric fracture of the proximal left femur.   Electronically Signed   By: Lajean Manes M.D.   On: 12/25/2014 21:00     CBC  Recent Labs Lab 12/25/14 1951 12/27/14 0543 12/28/14 0456  WBC 14.5* 12.5* 12.6*  HGB 12.5* 9.5* 8.5*  HCT 38.1* 29.2* 26.1*  PLT 200 175 161  MCV 82.1 83.4 83.7  MCH 26.9 27.1 27.2  MCHC 32.8 32.5 32.6  RDW 14.4 14.7 14.5  LYMPHSABS 0.8  --   --   MONOABS 0.8  --   --   EOSABS 0.1  --    --   BASOSABS 0.1  --   --     Chemistries   Recent Labs Lab 12/25/14 1951 12/27/14 0543 12/28/14 0456  NA 140 140 135  K 4.0 4.4 4.2  CL 108 106 102  CO2 25 26 26   GLUCOSE 117* 153* 124*  BUN 28* 36* 37*  CREATININE 1.20 1.11 1.00  CALCIUM 8.7* 8.5* 8.1*  AST  --  22  --   ALT  --  13*  --   ALKPHOS  --  52  --   BILITOT  --  0.7  --    ------------------------------------------------------------------------------------------------------------------ estimated creatinine clearance is 54.8 mL/min (by C-G formula based on Cr of 1). ------------------------------------------------------------------------------------------------------------------ No results for input(s): HGBA1C in the last 72 hours. ------------------------------------------------------------------------------------------------------------------ No results for input(s): CHOL, HDL, LDLCALC, TRIG, CHOLHDL, LDLDIRECT in the last 72 hours. ------------------------------------------------------------------------------------------------------------------ No results for input(s): TSH, T4TOTAL, T3FREE, THYROIDAB in the last 72 hours.  Invalid input(s): FREET3 ------------------------------------------------------------------------------------------------------------------ No results for input(s): VITAMINB12, FOLATE, FERRITIN, TIBC, IRON, RETICCTPCT in the last 72 hours.  Coagulation profile No results for input(s): INR, PROTIME in the last 168 hours.  No results for input(s): DDIMER in the last 72 hours.  Cardiac Enzymes No results for input(s): CKMB, TROPONINI, MYOGLOBIN in the last 168 hours.  Invalid input(s): CK ------------------------------------------------------------------------------------------------------------------ Invalid input(s): POCBNP   CBG: No results for input(s): GLUCAP in the last 168 hours.     Studies: Dg Chest Port 1 View  12/26/2014   CLINICAL DATA:  Leukocytosis.  EXAM:  PORTABLE CHEST 1 VIEW  COMPARISON:  09/29/2013  FINDINGS: Left lower lobe airspace opacity noted concerning for pneumonia. Minimal right base atelectasis. Heart is normal size. No effusions. No acute bony abnormality.  IMPRESSION: Left lower lobe opacity concerning for pneumonia.  Right base atelectasis.   Electronically Signed   By: Rolm Baptise M.D.   On: 12/26/2014 13:36   Dg C-arm 1-60 Min-no Report  12/26/2014   CLINICAL DATA: surgery   C-ARM  1-60 MINUTES  Fluoroscopy was utilized by the requesting physician.  No radiographic  interpretation.    Dg Hip Operative Unilat With Pelvis Left  12/26/2014   CLINICAL DATA:  ORIF of proximal left femoral fracture  EXAM: DG C-ARM 1-60 MIN-NO REPORT; OPERATIVE LEFT HIP WITH PELVIS  COMPARISON:  None.  FLUOROSCOPY TIME:  Radiation Exposure Index (as provided by the fluoroscopic device): Not available  If the device does not provide the exposure index:  Fluoroscopy Time:  59 seconds  Number of Acquired Images:  3  FINDINGS: A proximal medullary rod is noted with fixation screw. The fracture fragments are in near anatomic alignment.  IMPRESSION: ORIF of left femoral fracture   Electronically Signed   By: Inez Catalina M.D.   On: 12/26/2014 19:55      Lab Results  Component Value Date   HGBA1C 5.5 07/12/2012   Lab Results  Component Value Date   MICROALBUR 0.2 08/25/2008   LDLCALC 67 09/20/2013   CREATININE 1.00 12/28/2014       Scheduled Meds: . aspirin EC  325 mg Oral Q breakfast  . azithromycin  500 mg Intravenous Q24H  . cefTRIAXone (ROCEPHIN)  IV  1 g Intravenous Q24H  . docusate sodium  100 mg Oral BID  . donepezil  10 mg Oral Daily  . ferrous sulfate  325 mg Oral TID PC  . levothyroxine  100 mcg Oral Daily  . pantoprazole  80 mg Oral Daily  . terazosin  5 mg Oral QHS   Continuous Infusions:    Principal Problem:   Femur fracture, left Active Problems:   Fall   Intertrochanteric fracture of left femur   Leukocytosis    Time  spent: 25 minutes   [facility]  [facility] Pager [phone] If 7PM-7AM, please contact night-coverage at [email or web address], password [facility] 12/28/2014, 10:03 AM  LOS: 3 days

## 2014-12-28 NOTE — Progress Notes (Signed)
   Subjective: 2 Days Post-Op Procedure(s) (LRB): OPEN REDUCTION INTERNAL FIXATION HIP (Left) Patient reports pain as moderate.   Patient seen in rounds with Dr. Wynelle Link. Patient is well, and has had no acute complaints or problems other than pain in the left leg. He reports that he is unsure of what he has done with therapy thus far. He is unsure of his disposition upon DC.    Objective: Vital signs in last 24 hours: Temp:  [98.5 F (36.9 C)-99.3 F (37.4 C)] 98.5 F (36.9 C) (09/25 0700) Pulse Rate:  [79-90] 84 (09/25 0700) Resp:  [16-18] 16 (09/25 0700) BP: (122-142)/(78-83) 128/78 mmHg (09/25 0700) SpO2:  [91 %-96 %] 96 % (09/25 0700)  Intake/Output from previous day:  Intake/Output Summary (Last 24 hours) at 12/28/14 0859 Last data filed at 12/28/14 0720  Gross per 24 hour  Intake   1765 ml  Output    626 ml  Net   1139 ml    Intake/Output this shift: Total I/O In: 60 [P.O.:60] Out: 300 [Urine:300]  Labs:  Recent Labs  12/25/14 1951 12/27/14 0543 12/28/14 0456  HGB 12.5* 9.5* 8.5*    Recent Labs  12/27/14 0543 12/28/14 0456  WBC 12.5* 12.6*  RBC 3.50* 3.12*  HCT 29.2* 26.1*  PLT 175 161    Recent Labs  12/27/14 0543 12/28/14 0456  NA 140 135  K 4.4 4.2  CL 106 102  CO2 26 26  BUN 36* 37*  CREATININE 1.11 1.00  GLUCOSE 153* 124*  CALCIUM 8.5* 8.1*    EXAM General - Patient is Alert Extremity - Intact pulses distally Dorsiflexion/Plantar flexion intact Compartment soft Dressing/Incision - clean, dry, no drainage Motor Function - intact, moving foot and toes well on exam.   Past Medical History  Diagnosis Date  . Hypertension   . Hypothyroidism   . Diverticulosis of colon   . BPH (benign prostatic hypertrophy)   . Pancreatitis 2011    and gallstone pancreatitis and Ecoli bacteremia  . 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 followed by [name]- observation as of 09/2010  . Rabbit fever 1940  . History of  shingles   . Dementia     Assessment/Plan: 2 Days Post-Op Procedure(s) (LRB): OPEN REDUCTION INTERNAL FIXATION HIP (Left) Principal Problem:   Femur fracture, left Active Problems:   Fall   Intertrochanteric fracture of left femur   Leukocytosis  Estimated body mass index is 21.52 kg/(m^2) as calculated from the following:   Height as of this encounter: 5\' 10"  (1.778 m).   Weight as of this encounter: 68.04 kg (150 lb). Advance diet Up with therapy   Will have him continue work with therapy. Will DC foley today unless medicine has reason for continuation. Stable for DC from ortho perspective  DVT Prophylaxis - Aspirin PWB 50% left LE  Ardeen Jourdain, PA-C Orthopaedic Surgery 12/28/2014, 8:59 AM

## 2014-12-28 NOTE — Op Note (Signed)
NAMEMarland Kitchen  TOAN, MORT NO.:  [id number]  MEDICAL RECORD NO.:  [id number]  LOCATION:  9833                         FACILITY:  [facility]  PHYSICIAN:  Pietro Cassis. Alvan Dame, M.D.  DATE OF BIRTH:  [birth year]  DATE OF PROCEDURE:  12/26/2014 DATE OF DISCHARGE:                              OPERATIVE REPORT   PREOPERATIVE DIAGNOSIS:  Comminuted left intertrochanteric femur fracture.  POSTOPERATIVE DIAGNOSIS:  Comminuted left intertrochanteric femur fracture.  PROCEDURE:  Open reduction and internal fixation of left proximal femur fracture utilizing a Biomet Affixus nail 11 x 180 mm with a 110 mm lag screw at a 130 degrees with a 40 mm distal interlock.  SURGEON:  Pietro Cassis. Alvan Dame, M.D.  ASSISTANT:  Surgical Team.  ANESTHESIA:  General.  SPECIMENS:  None.  COMPLICATION:  None.  ESTIMATED BLOOD LOSS:  Probably about a 100 mL or less.  INDICATION FOR THE PROCEDURE:  Mr. Schear is an 81 year old male with a history of dementia, who unfortunately had a ground level fall at home. He was brought to the emergency room, where  radiographs revealed a proximal femur fracture.  He was admitted to the Medical Service based on hip fracture protocol.  Risks, benefits, and necessity of the procedure were discussed with his wife, who acts as the power of attorney.  This was obtained for management of the fracture and pain control.  PROCEDURE IN DETAIL:  The patient was brought to operative theater. Once adequate anesthesia, preoperative antibiotics, Ancef administered, he was positioned supine on the OSI [place] table.  His right lower extremity was flexed and abducted out of way with bony prominences padded, particularly the peroneal nerve.  Peroneal post was placed in the groin.  The left foot was placed in a traction boot.  Once positioned adequately and safely, fluoroscopy was brought to the field.  I evaluated the fracture pattern, identifying a very comminuted proximal femur  fracture.  This was particularly evident on the lateral radiograph with significant posterior displacement of the femoral shaft in relationship to the head and neck segment.  He was noted to have a comminuted intertrochanteric segment.  Once I identified what I was going to be dealing with, I then had the left hip prepped and draped in sterile fashion from the iliac wing to the knee using shower curtain technique.  Once draped, a time-out was performed identifying the patient, planned procedure, and extremity.  The fluoroscopy was brought back to the field.  Using a guidewire, the position of the tip of the trochanter was identified.  Incision was then made laterally and then proximal to this.  Soft tissue dissection was carried through the gluteal fascia.  A guidewire was then inserted into the tip of the trochanter as best it could be identified radiographically, and the guidewire was passed in the proximal femur.  With this, I then reamed the proximal femur, opened to allow for placement of the intramedullary nail.  The 11 x 180 mm nail was then passed into the proximal femoral shaft.  At this point, I evaluated and felt that we had nearly anatomic orientation in the AP view; however, laterally, there was still this posterior displacement.  I then had the orientation for the placement of the lag screw, and I then made an incision laterally.  With the guidewire insertion jig loaded into this incision site, I then also placed a Cobb elevator over the anterior aspect of the femoral neck.  Under lateral radiographs, I confirmed the reduction of the neck segment within the intertrochanteric segment back to the shaft; and once I had this into an anatomic position, I passed a guidewire into the center of the head in the AP and lateral planes slightly posterior but centrally on the AP view.  Once this was positioned and maintaining this reduction, I measured the depths, selected a 110  mm lag screw.  We drilled for this and then passed a 110 mm lag screw.  With the lag screw in position, I then used the compression wheel and medialized the shaft of the fracture providing a stable connection here at this point.  At this point, the proximal femoral apparatus was tightened down with a locking screw and backed off a quarter turn to allow further compression with ambulation.  At this point, I placed a distal interlock.  I did not feel based on the medialization of the shaft the head and neck segment. Then, an anti-rotation screw was necessary based on the reduction as obtained.  Once the distal screw was placed, the insertion jig was removed.  Final radiographs were obtained in the AP and lateral planes.  The wounds were irrigated with normal saline solution.  Both proximal incisions were closed in layers with #1 Vicryl on the gluteal fascia and iliotibial band.  I then used 2-0 Vicryl and staples on the skin.  The skin was cleaned, dried, and dressed sterilely with a long Mepilex dressing.  The patient was then brought to the recovery room, extubated in stable condition, tolerating the procedure well.  PLAN:  I will have instructions for partial weightbearing activities despite his memory loss issues which just might be a bit of a challenge. We would like to provide some support to the fracture union as much as possible.  Findings were reviewed with the family.    Pietro Cassis Alvan Dame, M.D.    MDO/MEDQ  D:  12/28/2014  T:  12/28/2014  Job:  [id number]

## 2014-12-28 NOTE — Clinical Social Work Note (Signed)
CSW met with pt and his wife at bedside to discuss discharge needs.  CSW encouraged pt and wife to discuss history and needs with regards to pt discharge  Pt has not been to rehab in past but is open to rehab at discharge.  Pt has slight dementia per wife.  Wife [facility] at discharge.    Dede Query, LCSW [place] Worker - Weekend Coverage cell #: [phone]

## 2014-12-28 NOTE — Discharge Instructions (Signed)
Partial weightbearing right LE 50% Change dressing daily with gauze and paper tape Shower only, no tub bath

## 2014-12-28 NOTE — Evaluation (Signed)
Occupational Therapy Evaluation Patient Details Name: [name] MRN: [id number] DOB: 1930/06/26 Today's Date: 12/28/2014    History of Present Illness s/p  ORIF L hip; PMHx: dementia   Clinical Impression   This 81 year old man was admitted after fall and is s/p L ORIF.  He was able to complete bathing, dressing and toilet transfers at mod I level prior to admission. Pt currently needs up to total A +2 for LB adls. He will benefit from skilled OT in acute setting and follow up OT at SNF.  Goals in acute are still at +2 assistance level for toilet transfers and will also focus on sitting tolerance during UB adls.      Follow Up Recommendations  SNF    Equipment Recommendations   (likely 3:1 commode)    Recommendations for Other Services       Precautions / Restrictions Precautions Precautions: Fall Restrictions LLE Weight Bearing: Partial weight bearing LLE Partial Weight Bearing Percentage or Pounds: 50%      Mobility Bed Mobility     Rolling: +2 for physical assistance;Max assist         General bed mobility comments: to Left; utilized pad; did not roll completely  Transfers                 General transfer comment: not attempted    Balance                                            ADL Overall ADL's : Needs assistance/impaired Eating/Feeding: Supervision/ safety;Set up;Bed level   Grooming: Wash/dry face;Set up;Bed level   Upper Body Bathing: Supervision/ safety;Bed level   Lower Body Bathing: Total assistance;+2 for physical assistance;Bed level   Upper Body Dressing : Minimal assistance;Bed level   Lower Body Dressing: Total assistance;+2 for physical assistance;Bed level                 General ADL Comments: Pt seen at bed level.  Initiated rolling to L, but need +2 assist to complete.  Pt needs cues and extra time to initiate.  Pt has a catheter at this time     Vision     Perception     Praxis       Pertinent Vitals/Pain Pain Assessment: Faces Faces Pain Scale: Hurts even more Pain Location: L hip and thigh Pain Intervention(s): Limited activity within patient's tolerance;Monitored during session;Premedicated before session;Repositioned;Ice applied     Hand Dominance Left   Extremity/Trunk Assessment Upper Extremity Assessment Upper Extremity Assessment: Generalized weakness           Communication Communication Communication: HOH   Cognition Arousal/Alertness: Awake/alert Behavior During Therapy: WFL for tasks assessed/performed Overall Cognitive Status: History of cognitive impairments - at baseline                     General Comments       Exercises       Shoulder Instructions      Home Living Family/patient expects to be discharged to:: Skilled nursing facility Living Arrangements: Spouse/significant other                                      Prior Functioning/Environment Level of Independence: Independent with assistive device(s)  Comments: pt was able to bathe and dress himself    OT Diagnosis: Acute pain;Cognitive deficits;Generalized weakness   OT Problem List: Decreased strength;Decreased activity tolerance;Decreased cognition;Decreased safety awareness;Decreased knowledge of use of DME or AE;Pain (balance NT)   OT Treatment/Interventions: Self-care/ADL training;DME and/or AE instruction;Patient/family education;Balance training;Cognitive remediation/compensation;Therapeutic activities    OT Goals(Current goals can be found in the care plan section) Acute Rehab OT Goals Patient Stated Goal: wife's:  for pt to get back to baseline OT Goal Formulation: With patient/family Time For Goal Achievement: 01/04/15 Potential to Achieve Goals: Good ADL Goals Pt Will Transfer to Toilet: with +2 assist;with max assist;bedside commode;stand pivot transfer Additional ADL Goal #1: Pt will perform bed mobility with mod A +2 in  preparation for adls Additional ADL Goal #2: pt will go from sit to stand with max A +2 and maintain for 2 minutes for ADLs with mod A Additional ADL Goal #3: Pt will tolerate sitting EOB once assisted and complete UB adls with min guard for balance and cues for activities  OT Frequency: Min 2X/week   Barriers to D/C:            Co-evaluation              End of Session    Activity Tolerance: No increased pain Patient left: in bed;with call bell/phone within reach;with bed alarm set;with family/visitor present   Time: 9675-9163 OT Time Calculation (min): 18 min Charges:  OT General Charges $OT Visit: 1 Procedure OT Evaluation $Initial OT Evaluation Tier I: 1 Procedure G-Codes:    SPENCER,MARYELLEN 2015/01/18, 11:05 AM Lesle Chris, OTR/L [phone] Jan 18, 2015

## 2014-12-29 ENCOUNTER — Encounter (HOSPITAL_COMMUNITY): Payer: Self-pay | Admitting: Orthopedic Surgery

## 2014-12-29 DIAGNOSIS — R0902 Hypoxemia: Secondary | ICD-10-CM

## 2014-12-29 DIAGNOSIS — S72142S Displaced intertrochanteric fracture of left femur, sequela: Secondary | ICD-10-CM

## 2014-12-29 DIAGNOSIS — J189 Pneumonia, unspecified organism: Secondary | ICD-10-CM

## 2014-12-29 DIAGNOSIS — K5900 Constipation, unspecified: Secondary | ICD-10-CM

## 2014-12-29 DIAGNOSIS — W19XXXD Unspecified fall, subsequent encounter: Secondary | ICD-10-CM

## 2014-12-29 DIAGNOSIS — E039 Hypothyroidism, unspecified: Secondary | ICD-10-CM

## 2014-12-29 LAB — COMPREHENSIVE METABOLIC PANEL
ALBUMIN: 2.9 g/dL — AB (ref 3.5–5.0)
ALK PHOS: 54 U/L (ref 38–126)
ALT: 11 U/L — AB (ref 17–63)
AST: 24 U/L (ref 15–41)
Anion gap: 7 (ref 5–15)
BILIRUBIN TOTAL: 0.4 mg/dL (ref 0.3–1.2)
BUN: 29 mg/dL — AB (ref 6–20)
CALCIUM: 8.1 mg/dL — AB (ref 8.9–10.3)
CO2: 28 mmol/L (ref 22–32)
Chloride: 100 mmol/L — ABNORMAL LOW (ref 101–111)
Creatinine, Ser: 0.85 mg/dL (ref 0.61–1.24)
GFR calc Af Amer: >60 mL/min (ref >60)
GFR calc non Af Amer: >60 mL/min (ref >60)
GLUCOSE: 111 mg/dL — AB (ref 65–99)
Potassium: 3.8 mmol/L (ref 3.5–5.1)
Sodium: 135 mmol/L (ref 135–145)
TOTAL PROTEIN: 5.5 g/dL — AB (ref 6.5–8.1)

## 2014-12-29 LAB — CBC
HEMATOCRIT: 24.8 % — AB (ref 39.0–52.0)
HEMOGLOBIN: 8 g/dL — AB (ref 13.0–17.0)
MCH: 26.8 pg (ref 26.0–34.0)
MCHC: 32.3 g/dL (ref 30.0–36.0)
MCV: 82.9 fL (ref 78.0–100.0)
Platelets: 195 10*3/uL (ref 150–400)
RBC: 2.99 MIL/uL — ABNORMAL LOW (ref 4.22–5.81)
RDW: 14.5 % (ref 11.5–15.5)
WBC: 9.2 10*3/uL (ref 4.0–10.5)

## 2014-12-29 MED ORDER — BISACODYL 10 MG RE SUPP
10.0000 mg | Freq: Once | RECTAL | Status: AC
  Administered 2014-12-29: 10 mg via RECTAL
  Filled 2014-12-29: qty 1

## 2014-12-29 MED ORDER — POLYETHYLENE GLYCOL 3350 17 G PO PACK
17.0000 g | PACK | Freq: Every day | ORAL | Status: DC
  Administered 2014-12-29 – 2014-12-30 (×2): 17 g via ORAL

## 2014-12-29 NOTE — Progress Notes (Signed)
Patient ID: [name], male   DOB: 1931-02-14, 81 y.o.   MRN: [id number] Subjective: 3 Days Post-Op Procedure(s) (LRB): OPEN REDUCTION INTERNAL FIXATION HIP (Left)    Patient remains confused but calm down after some of the pain meds wore off.  Wife asleep in room with him  Objective:   VITALS:   Filed Vitals:   12/29/14 0415  BP: 160/90  Pulse: 91  Temp: 98.4 F (36.9 C)  Resp: 20    Neurovascular intact Incision: dressing C/D/I  LABS  Recent Labs  12/27/14 0543 12/28/14 0456 12/29/14 0444  HGB 9.5* 8.5* 8.0*  HCT 29.2* 26.1* 24.8*  WBC 12.5* 12.6* 9.2  PLT 175 161 195     Recent Labs  12/27/14 0543 12/28/14 0456 12/29/14 0444  NA 140 135 135  K 4.4 4.2 3.8  BUN 36* 37* 29*  CREATININE 1.11 1.00 0.85  GLUCOSE 153* 124* 111*    No results for input(s): LABPT, INR in the last 72 hours.   Assessment/Plan: 3 Days Post-Op Procedure(s) (LRB): OPEN REDUCTION INTERNAL FIXATION HIP (Left)   Up with therapy Discharge to SNF today or tomorrow pending social work  RTC in 2 weeks DVT prophylaxis - ECASA PWB LLE

## 2014-12-29 NOTE — Care Management Note (Signed)
Case Management Note  Patient Details  Name: [name] MRN: [id number] Date of Birth: 22-Oct-1930  Subjective/Objective:  81 y/o m admitted w/L hip pain. POD# 3 ORIF, PNA. UL:[id number]. IV abx. PT-SNF. CSW following for SNF.                  Action/Plan:d/c SNF.   Expected Discharge Date:  12/29/14               Expected Discharge Plan:  Skilled Nursing Facility  In-House Referral:  Clinical Social Work  Discharge planning Services  CM Consult  Post Acute Care Choice:    Choice offered to:     DME Arranged:    DME Agency:     HH Arranged:    [place] Agency:     Status of Service:  In process, will continue to follow  Medicare Important Message Given:  Yes-second notification given Date Medicare IM Given:    Medicare IM give by:    Date Additional Medicare IM Given:    Additional Medicare Important Message give by:     If discussed at [place] of Stay Meetings, dates discussed:    Additional Comments:  Dessa Phi, RN 12/29/2014, 7:33 PM

## 2014-12-29 NOTE — Progress Notes (Signed)
CSW met with pt / spouse today to assist with d/c planning. [name] is able to admit pt once he is stable for d/c. Pt has [employer] which requires prior authorization. CSW will assist with authorization process.  Werner Lean LCSW [phone]

## 2014-12-29 NOTE — Care Management Important Message (Signed)
Important Message  Patient Details  Name: [name] MRN: [id number] Date of Birth: 24-Jan-1931   Medicare Important Message Given:  [facility] notification given    Camillo Flaming 12/29/2014, 12:43 [place] Message  Patient Details  Name: [name] MRN: [id number] Date of Birth: 16-Apr-1930   Medicare Important Message Given:  Yes-second notification given    Camillo Flaming 12/29/2014, 12:42 PM

## 2014-12-29 NOTE — Progress Notes (Signed)
Physical Therapy Treatment Patient Details Name: Avaneesh Pepitone Snoke MRN: [id number] DOB: 04/09/1930 Today's Date: 12/29/2014    History of Present Illness s/p  ORIF L hip; PMHx: dementia    PT Comments    Pt will benefit from continued PT, making progress although small gains, discussed with pt wife; they are hoping for [facility] rehab post acute  Follow Up Recommendations  SNF;Supervision/Assistance - 24 hour     Equipment Recommendations  None recommended by PT    Recommendations for Other Services       Precautions / Restrictions Restrictions LLE Weight Bearing: Partial weight bearing LLE Partial Weight Bearing Percentage or Pounds: 50%    Mobility  Bed Mobility   Bed Mobility: Supine to Sit;Sit to Supine     Supine to sit: +2 for physical assistance;Max assist;Total assist Sit to supine: +2 for physical assistance;+2 for safety/equipment;Total assist   General bed mobility comments: bed pad utilized to scoot in supine, assist with trunk and LEs; multi-modal cues throughout for technique, self assist  Transfers Overall transfer level: Needs assistance Equipment used: Rolling walker (2 wheeled) Transfers: Sit to/from Stand Sit to Stand: +2 physical assistance;Max assist         General transfer comment: assist to rise, stabilize; pt stood for brief period and then began to push himself back in to sitting position; +2 and cues throughout for safety  Ambulation/Gait                 Stairs            Wheelchair Mobility    Modified Rankin (Stroke Patients Only)       Balance Overall balance assessment: Needs assistance Sitting-balance support: No upper extremity supported;Bilateral upper extremity supported;Feet supported Sitting balance-Leahy Scale: Poor   Postural control: Posterior lean;Right lateral lean Standing balance support: Bilateral upper extremity supported Standing balance-Leahy Scale: Zero                       Cognition Arousal/Alertness: Awake/alert Behavior During Therapy: WFL for tasks assessed/performed Overall Cognitive Status: History of cognitive impairments - at baseline Area of Impairment: Following commands;Problem solving;Safety/judgement;Memory     Memory: Decreased recall of precautions Following Commands: Follows one step commands inconsistently Safety/Judgement: Decreased awareness of safety;Decreased awareness of deficits   Problem Solving: Difficulty sequencing;Requires verbal cues;Requires tactile cues General Comments: pt appears to be less painful, more cooperative today    Exercises General Exercises - Lower Extremity Heel Slides: AAROM;PROM;Left;5 reps    General Comments        Pertinent Vitals/Pain Pain Assessment: Faces Faces Pain Scale: Hurts little more Pain Location: L hip Pain Descriptors / Indicators: Guarding;Grimacing Pain Intervention(s): Limited activity within patient's tolerance;Monitored during session;Premedicated before session;Repositioned    Home Living                      Prior Function            PT Goals (current goals can now be found in the care plan section) Acute Rehab PT Goals Patient Stated Goal: wife's:  for pt to get back to baseline PT Goal Formulation: Patient unable to participate in goal setting Time For Goal Achievement: 01/03/15 Potential to Achieve Goals: Good Progress towards PT goals: Progressing toward goals    Frequency  Min 3X/week    PT Plan Current plan remains appropriate    Co-evaluation             End  of Session Equipment Utilized During Treatment: Gait belt Activity Tolerance: Patient limited by fatigue Patient left: in bed;with call bell/phone within reach;with family/visitor present;with bed alarm set     Time:  -     Charges:  $Therapeutic Activity: 8-22 mins                    G Codes:      WILLIAMS,TARA 31-Dec-2014, 2:07 PM

## 2014-12-29 NOTE — Progress Notes (Signed)
Progress Note   [name] MHD:[id number] DOB: 1931/03/19 DOA: 12/25/2014 PCP: Elsie Stain, MD   Brief Narrative:   [name] is an 81 y.o. male who was admitted 12/25/14 with an acute left femur fracture after suffering from a mechanical fall. He underwent ORIF of his left proximal femur on 12/26/14. His initial chest x-ray also showed findings consistent with community-acquired pneumonia.  Assessment/Plan:   Principal Problem:   Left femur intertrochanteric fracture secondary to a mechanical fall in the setting of generalized weakness - Status post left ORIF 12/26/14. - Continue physical therapy with plans for discharge to a SNF when medically stable.  Active Problems:   CAP (community acquired pneumonia)/leukocytosis/hypoxia - Generalized weakness likely related to acute pneumonia. - Initial chest radiograph consistent with pneumonia and leukocytosis resolved with antibiotics. - Continue empiric azithromycin/Rocephin. Follow-up sputum cultures. - Continue oxygen via nasal cannula to maintain oxygen saturations greater than 92%.    Acute blood loss anemia status post surgery - Hemoglobin dropped 4 g noted postoperatively.    Hypothyroidism  - Continue Synthroid.    Constipation  - Likely secondary to pain medication. We'll give a Dulcolax suppository today and place on a bowel regimen.    DVT Prophylaxis - Aspirin ordered per orthopedics.  Family Communication: Wife at the bedside. Disposition Plan:SNF when stable. Code Status:     Code Status Orders        Start     Ordered   12/26/14 2151  Full code   Continuous     12/26/14 2150    Advance Directive Documentation        Most Recent Value   Type of Advance Directive  Living will   Pre-existing out of facility DNR order (yellow form or pink MOST form)     "MOST" Form in Place?          IV Access:    Peripheral IV   Procedures and diagnostic studies:   Dg Knee 1-2 Views  Left  12/25/2014   CLINICAL DATA:  [place], patient fell injuring LEFT knee, history of dementia and knee replacement surgery  EXAM: LEFT KNEE - 1-2 VIEW  COMPARISON:  11/29/2014  FINDINGS: Diffuse osseous demineralization.  Components of LEFT knee prosthesis in expected positions.  No definite fracture, dislocation or bone destruction.  No periprosthetic lucency or joint effusion.  IMPRESSION: LEFT knee prosthesis and osseous demineralization without acute abnormalities.   Electronically Signed   By: Lavonia Dana M.D.   On: 12/25/2014 21:02   Dg Chest Port 1 View  12/26/2014   CLINICAL DATA:  Leukocytosis.  EXAM: PORTABLE CHEST 1 VIEW  COMPARISON:  09/29/2013  FINDINGS: Left lower lobe airspace opacity noted concerning for pneumonia. Minimal right base atelectasis. Heart is normal size. No effusions. No acute bony abnormality.  IMPRESSION: Left lower lobe opacity concerning for pneumonia.  Right base atelectasis.   Electronically Signed   By: Rolm Baptise M.D.   On: 12/26/2014 13:36   Dg C-arm 1-60 Min-no Report  12/26/2014   CLINICAL DATA: surgery   C-ARM 1-60 MINUTES  Fluoroscopy was utilized by the requesting physician.  No radiographic  interpretation.    Dg Hip Operative Unilat With Pelvis Left  12/26/2014   CLINICAL DATA:  ORIF of proximal left femoral fracture  EXAM: DG C-ARM 1-60 MIN-NO REPORT; OPERATIVE LEFT HIP WITH PELVIS  COMPARISON:  None.  FLUOROSCOPY TIME:  Radiation Exposure Index (as provided by the fluoroscopic device):  Not available  If the device does not provide the exposure index:  Fluoroscopy Time:  59 seconds  Number of Acquired Images:  3  FINDINGS: A proximal medullary rod is noted with fixation screw. The fracture fragments are in near anatomic alignment.  IMPRESSION: ORIF of left femoral fracture   Electronically Signed   By: Inez Catalina M.D.   On: 12/26/2014 19:55   Dg Hip Unilat With Pelvis 2-3 Views Left  12/25/2014   CLINICAL DATA:  EMS from airport  where his legs "gave out" and he fell, injuring the left knee. Hx of bilateral TKA and dementia.  EXAM: DG HIP (WITH OR WITHOUT PELVIS) 2-3V LEFT  COMPARISON:  None.  FINDINGS: There is a comminuted displaced fracture of the proximal left femur. The fractures intertrochanteric. There separate fracture components of the lesser greater trochanter. The primary fracture components are displaced by approximately 16 mm, the shaft fracture component displacing laterally and mildly retracting superiorly. Lesser and greater trochanter fracture components are also mildly displaced. There is mild varus angulation.  No other fractures. Hip joint is normally spaced and aligned. Bones are diffusely demineralized.  IMPRESSION: Comminuted displaced intertrochanteric fracture of the proximal left femur.   Electronically Signed   By: Lajean Manes M.D.   On: 12/25/2014 21:00     Medical Consultants:    None.  Anti-Infectives:   Anti-infectives    Start     Dose/Rate Route Frequency Ordered Stop   12/27/14 1600  cefTRIAXone (ROCEPHIN) 1 g in dextrose 5 % 50 mL IVPB     1 g 100 mL/hr over 30 Minutes Intravenous Every 24 hours 12/27/14 1441     12/27/14 1500  azithromycin (ZITHROMAX) 500 mg in dextrose 5 % 250 mL IVPB     500 mg 250 mL/hr over 60 Minutes Intravenous Every 24 hours 12/27/14 1429     12/27/14 1400  levofloxacin (LEVAQUIN) IVPB 750 mg  Status:  Discontinued     750 mg 100 mL/hr over 90 Minutes Intravenous Every 24 hours 12/27/14 1349 12/27/14 1429   12/27/14 0200  ceFAZolin (ANCEF) IVPB 1 g/50 mL premix     1 g 100 mL/hr over 30 Minutes Intravenous Every 6 hours 12/26/14 2150 12/27/14 0810      Subjective:   [name] does not feel well.  He is sleepy/lethargic.  No N/V since 2 nights ago.  Has not had a BM in 4 days.  Appetite poor.  Has hip pain.  Objective:    Filed Vitals:   12/28/14 0700 12/28/14 1400 12/28/14 2045 12/29/14 0415  BP: 128/78 147/71 168/78 160/90  Pulse: 84 86  84 91  Temp: 98.5 F (36.9 C) 97.9 F (36.6 C) 98.4 F (36.9 C) 98.4 F (36.9 C)  TempSrc: Oral Oral Oral Oral  Resp: 16 15 20 20   Height:      Weight:      SpO2: 96% 92% 91% 92%    Intake/Output Summary (Last 24 hours) at 12/29/14 1028 Last data filed at 12/29/14 0415  Gross per 24 hour  Intake   1090 ml  Output    325 ml  Net    765 ml   Filed Weights   12/25/14 1933  Weight: 68.04 kg (150 lb)    Exam: Gen:  Lethargic Cardiovascular:  RRR, No M/R/G Respiratory:  Lungs diminished Gastrointestinal:  Abdomen soft, NT/ND, + BS Extremities:  No C/E/C   Data Reviewed:    Labs: Basic Metabolic Panel:  Recent  Labs Lab 12/25/14 1951 12/27/14 0543 12/28/14 0456 12/29/14 0444  NA 140 140 135 135  K 4.0 4.4 4.2 3.8  CL 108 106 102 100*  CO2 25 26 26 28   GLUCOSE 117* 153* 124* 111*  BUN 28* 36* 37* 29*  CREATININE 1.20 1.11 1.00 0.85  CALCIUM 8.7* 8.5* 8.1* 8.1*   GFR Estimated Creatinine Clearance: 64.4 mL/min (by C-G formula based on Cr of 0.85). Liver Function Tests:  Recent Labs Lab 12/27/14 0543 12/29/14 0444  AST 22 24  ALT 13* 11*  ALKPHOS 52 54  BILITOT 0.7 0.4  PROT 5.5* 5.5*  ALBUMIN 3.0* 2.9*   CBC:  Recent Labs Lab 12/25/14 1951 12/27/14 0543 12/28/14 0456 12/29/14 0444  WBC 14.5* 12.5* 12.6* 9.2  NEUTROABS 12.7*  --   --   --   HGB 12.5* 9.5* 8.5* 8.0*  HCT 38.1* 29.2* 26.1* 24.8*  MCV 82.1 83.4 83.7 82.9  PLT 200 175 161 195   Microbiology Recent Results (from the past 240 hour(s))  Surgical pcr screen     Status: None   Collection Time: 12/26/14  1:04 PM  Result Value Ref Range Status   MRSA, PCR NEGATIVE NEGATIVE Final   Staphylococcus aureus NEGATIVE NEGATIVE Final    Comment:        The Xpert SA Assay (FDA approved for NASAL specimens in patients over 83 years of age), is one component of a comprehensive surveillance program.  Test performance has been validated by [facility] for patients greater than or equal  to 42 year old. It is not intended to diagnose infection nor to guide or monitor treatment.   Culture, expectorated sputum-assessment     Status: None   Collection Time: 12/27/14  5:00 PM  Result Value Ref Range Status   Specimen Description SPUTUM  Final   Special Requests NONE  Final   Sputum evaluation   Final    THIS SPECIMEN IS ACCEPTABLE. RESPIRATORY CULTURE REPORT TO FOLLOW.   Report Status 12/27/2014 FINAL  Final  Culture, respiratory (NON-Expectorated)     Status: None (Preliminary result)   Collection Time: 12/27/14  5:00 PM  Result Value Ref Range Status   Specimen Description SPUTUM  Final   Special Requests NONE  Final   Gram Stain   Final    ABUNDANT WBC PRESENT, PREDOMINANTLY PMN RARE SQUAMOUS EPITHELIAL CELLS PRESENT FEW GRAM POSITIVE COCCI IN PAIRS FEW GRAM VARIABLE ROD Performed at [facility]    Culture PENDING  Incomplete   Report Status PENDING  Incomplete     Medications:   . aspirin EC  325 mg Oral Q breakfast  . azithromycin  500 mg Intravenous Q24H  . cefTRIAXone (ROCEPHIN)  IV  1 g Intravenous Q24H  . docusate sodium  100 mg Oral BID  . donepezil  10 mg Oral Daily  . ferrous sulfate  325 mg Oral TID PC  . levothyroxine  100 mcg Oral Daily  . pantoprazole  80 mg Oral Daily  . terazosin  5 mg Oral QHS   Continuous Infusions:   Time spent: 25 minutes.   LOS: 4 days   [facility] Pager [phone]. If unable to reach me by pager, please call my cell phone at [phone].  *Please refer to [email or web address], password [email or web address] to get updated schedule on who will round on this patient, as hospitalists switch teams weekly. If 7PM-7AM, please contact night-coverage at [email or web address], password [email or web address] for any overnight needs.  12/29/2014, 10:28 AM

## 2014-12-30 DIAGNOSIS — W19XXXS Unspecified fall, sequela: Secondary | ICD-10-CM

## 2014-12-30 LAB — HEMOGLOBIN AND HEMATOCRIT, BLOOD
HCT: 25.9 % — ABNORMAL LOW (ref 39.0–52.0)
Hemoglobin: 8.4 g/dL — ABNORMAL LOW (ref 13.0–17.0)

## 2014-12-30 LAB — CULTURE, RESPIRATORY W GRAM STAIN: Culture: NORMAL

## 2014-12-30 LAB — CULTURE, RESPIRATORY

## 2014-12-30 MED ORDER — ENSURE ENLIVE PO LIQD: 237.0000 mL | Freq: Two times a day (BID) | ORAL | Status: DC

## 2014-12-30 MED ORDER — POLYETHYLENE GLYCOL 3350 17 G PO PACK
17.0000 g | PACK | Freq: Every day | ORAL | Status: DC
Start: 2014-12-30 — End: 2017-06-01

## 2014-12-30 MED ORDER — ENSURE ENLIVE PO LIQD
237.0000 mL | Freq: Two times a day (BID) | ORAL | Status: DC
  Administered 2014-12-30 (×2): 237 mL via ORAL

## 2014-12-30 MED ORDER — FERROUS SULFATE 325 (65 FE) MG PO TABS: 325.0000 mg | ORAL_TABLET | Freq: Two times a day (BID) | ORAL | Status: DC

## 2014-12-30 MED ORDER — LEVOFLOXACIN 750 MG PO TABS: 750.0000 mg | ORAL_TABLET | Freq: Every day | ORAL | Status: AC

## 2014-12-30 MED ORDER — ONDANSETRON HCL 4 MG PO TABS: 4.0000 mg | ORAL_TABLET | Freq: Four times a day (QID) | ORAL | Status: DC | PRN

## 2014-12-30 NOTE — Discharge Summary (Addendum)
Physician Discharge Summary  [name] ZOX:[id number] DOB: 08-Sep-1930 DOA: 12/25/2014  PCP: Elsie Stain, MD  Admit date: 12/25/2014 Discharge date: 12/30/2014   Recommendations for Outpatient Follow-Up:   1. Recommend repeat CXR in 4-6 weeks to ensure resolution of pneumonia and to exclude any underlying pathology. 2. Continue supplemental oxygen to maintain oxygen saturations > 92%.  Wean as tolerated.   Discharge Diagnosis:   Principal Problem:    Intertrochanteric fracture of left femur Active Problems:    Fall at home    Leukocytosis    CAP (community acquired pneumonia)    Hypoxia    Constipation    Hypothyroidism   Discharge disposition:  SNF: [facility]  Discharge Condition: Stable.  Diet recommendation: Low sodium, heart healthy.    Wound care: Change dressing to hip daily as needed.   History of Present Illness:   [name] is an 81 y.o. male who was admitted 12/25/14 with an acute left femur fracture after suffering from a mechanical fall. He underwent ORIF of his left proximal femur on 12/26/14. His initial chest x-ray also showed findings consistent with community-acquired pneumonia.  Hospital Course by Problem:   Principal Problem:  Left femur intertrochanteric fracture secondary to a mechanical fall in the setting of generalized weakness - Status post left ORIF 12/26/14. - Continue physical therapy with plans for discharge to a SNF today.  Active Problems:  CAP (community acquired pneumonia)/leukocytosis/hypoxia - Generalized weakness likely related to acute pneumonia. - Initial chest radiograph consistent with pneumonia and leukocytosis resolved with antibiotics. - Initially treated with empiric azithromycin/Rocephin. D/C on oral Levaquin to complete a total course of 7 days. - Sputum cultures only grew normal oropharyngeal flora. - Continue oxygen via nasal cannula to maintain oxygen saturations greater than 92%, wean as  tolerated.   Acute blood loss anemia status post surgery - Hemoglobin dropped 4 g noted postoperatively. Started on iron therapy.  H&H stable at D/C.   Hypothyroidism  - Continue Synthroid.   Constipation  - Bowels moved after being given a Dulcolax suppository 12/29/14, daily MiraLAX ordered at D/C.    Medical Consultants:    Dr. Paralee Cancel, Orthopedic Surgery.   Discharge Exam:   Filed Vitals:   12/30/14 0430  BP: 148/76  Pulse: 86  Temp: 98.6 F (37 C)  Resp: 16   Filed Vitals:   12/29/14 0415 12/29/14 1418 12/29/14 2000 12/30/14 0430  BP: 160/90 138/78 142/73 148/76  Pulse: 91 82 83 86  Temp: 98.4 F (36.9 C) 98.2 F (36.8 C) 98.4 F (36.9 C) 98.6 F (37 C)  TempSrc: Oral Oral Oral Oral  Resp: 20 16 16 16   Height:      Weight:      SpO2: 92% 94% 92% 93%    Gen:  NAD, weak Cardiovascular:  RRR, No M/R/G Respiratory: Lungs diminshed Gastrointestinal: Abdomen soft, NT/ND with normal active bowel sounds. Extremities: No C/E/C.  Staples intact left hip, no signs of infection, but some irritation/erythema from tape    The results of significant diagnostics from this hospitalization (including imaging, microbiology, ancillary and laboratory) are listed below for reference.     Procedures and Diagnostic Studies:   Dg Knee 1-2 Views Left  12/25/2014   CLINICAL DATA:  [place], patient fell injuring LEFT knee, history of dementia and knee replacement surgery  EXAM: LEFT KNEE - 1-2 VIEW  COMPARISON:  11/29/2014  FINDINGS: Diffuse osseous demineralization.  Components of LEFT knee prosthesis in  expected positions.  No definite fracture, dislocation or bone destruction.  No periprosthetic lucency or joint effusion.  IMPRESSION: LEFT knee prosthesis and osseous demineralization without acute abnormalities.   Electronically Signed   By: Lavonia Dana M.D.   On: 12/25/2014 21:02   Dg Chest Port 1 View  12/26/2014   CLINICAL DATA:  Leukocytosis.   EXAM: PORTABLE CHEST 1 VIEW  COMPARISON:  09/29/2013  FINDINGS: Left lower lobe airspace opacity noted concerning for pneumonia. Minimal right base atelectasis. Heart is normal size. No effusions. No acute bony abnormality.  IMPRESSION: Left lower lobe opacity concerning for pneumonia.  Right base atelectasis.   Electronically Signed   By: Rolm Baptise M.D.   On: 12/26/2014 13:36   Dg C-arm 1-60 Min-no Report  12/26/2014   CLINICAL DATA: surgery   C-ARM 1-60 MINUTES  Fluoroscopy was utilized by the requesting physician.  No radiographic  interpretation.    Dg Hip Operative Unilat With Pelvis Left  12/26/2014   CLINICAL DATA:  ORIF of proximal left femoral fracture  EXAM: DG C-ARM 1-60 MIN-NO REPORT; OPERATIVE LEFT HIP WITH PELVIS  COMPARISON:  None.  FLUOROSCOPY TIME:  Radiation Exposure Index (as provided by the fluoroscopic device): Not available  If the device does not provide the exposure index:  Fluoroscopy Time:  59 seconds  Number of Acquired Images:  3  FINDINGS: A proximal medullary rod is noted with fixation screw. The fracture fragments are in near anatomic alignment.  IMPRESSION: ORIF of left femoral fracture   Electronically Signed   By: Inez Catalina M.D.   On: 12/26/2014 19:55   Dg Hip Unilat With Pelvis 2-3 Views Left  12/25/2014   CLINICAL DATA:  EMS from airport where his legs "gave out" and he fell, injuring the left knee. Hx of bilateral TKA and dementia.  EXAM: DG HIP (WITH OR WITHOUT PELVIS) 2-3V LEFT  COMPARISON:  None.  FINDINGS: There is a comminuted displaced fracture of the proximal left femur. The fractures intertrochanteric. There separate fracture components of the lesser greater trochanter. The primary fracture components are displaced by approximately 16 mm, the shaft fracture component displacing laterally and mildly retracting superiorly. Lesser and greater trochanter fracture components are also mildly displaced. There is mild varus angulation.  No other fractures. Hip  joint is normally spaced and aligned. Bones are diffusely demineralized.  IMPRESSION: Comminuted displaced intertrochanteric fracture of the proximal left femur.   Electronically Signed   By: Lajean Manes M.D.   On: 12/25/2014 21:00     Labs:   Basic Metabolic Panel:  Recent Labs Lab 12/25/14 1951 12/27/14 0543 12/28/14 0456 12/29/14 0444  NA 140 140 135 135  K 4.0 4.4 4.2 3.8  CL 108 106 102 100*  CO2 25 26 26 28   GLUCOSE 117* 153* 124* 111*  BUN 28* 36* 37* 29*  CREATININE 1.20 1.11 1.00 0.85  CALCIUM 8.7* 8.5* 8.1* 8.1*   GFR Estimated Creatinine Clearance: 64.4 mL/min (by C-G formula based on Cr of 0.85). Liver Function Tests:  Recent Labs Lab 12/27/14 0543 12/29/14 0444  AST 22 24  ALT 13* 11*  ALKPHOS 52 54  BILITOT 0.7 0.4  PROT 5.5* 5.5*  ALBUMIN 3.0* 2.9*   CBC:  Recent Labs Lab 12/25/14 1951 12/27/14 0543 12/28/14 0456 12/29/14 0444 12/30/14 0935  WBC 14.5* 12.5* 12.6* 9.2  --   NEUTROABS 12.7*  --   --   --   --   HGB 12.5* 9.5* 8.5* 8.0* 8.4*  HCT 38.1* 29.2* 26.1* 24.8* 25.9*  MCV 82.1 83.4 83.7 82.9  --   PLT 200 175 161 195  --    Microbiology Recent Results (from the past 240 hour(s))  Surgical pcr screen     Status: None   Collection Time: 12/26/14  1:04 PM  Result Value Ref Range Status   MRSA, PCR NEGATIVE NEGATIVE Final   Staphylococcus aureus NEGATIVE NEGATIVE Final    Comment:        The Xpert SA Assay (FDA approved for NASAL specimens in patients over 16 years of age), is one component of a comprehensive surveillance program.  Test performance has been validated by [facility] for patients greater than or equal to 42 year old. It is not intended to diagnose infection nor to guide or monitor treatment.   Culture, expectorated sputum-assessment     Status: None   Collection Time: 12/27/14  5:00 PM  Result Value Ref Range Status   Specimen Description SPUTUM  Final   Special Requests NONE  Final   Sputum evaluation    Final    THIS SPECIMEN IS ACCEPTABLE. RESPIRATORY CULTURE REPORT TO FOLLOW.   Report Status 12/27/2014 FINAL  Final  Culture, respiratory (NON-Expectorated)     Status: None   Collection Time: 12/27/14  5:00 PM  Result Value Ref Range Status   Specimen Description SPUTUM  Final   Special Requests NONE  Final   Gram Stain   Final    ABUNDANT WBC PRESENT, PREDOMINANTLY PMN RARE SQUAMOUS EPITHELIAL CELLS PRESENT FEW GRAM POSITIVE COCCI IN PAIRS FEW GRAM VARIABLE ROD Performed at [facility]    Culture   Final    NORMAL OROPHARYNGEAL FLORA Performed at [facility]    Report Status 12/30/2014 FINAL  Final     Discharge Instructions:       Discharge Instructions    Call MD for:  extreme fatigue    Complete by:  As directed      Call MD for:  persistant nausea and vomiting    Complete by:  As directed      Call MD for:  redness, tenderness, or signs of infection (pain, swelling, redness, odor or green/yellow discharge around incision site)    Complete by:  As directed      Call MD for:  severe uncontrolled pain    Complete by:  As directed      Call MD for:  temperature >100.4    Complete by:  As directed      Diet - low sodium heart healthy    Complete by:  As directed      Increase activity slowly    Complete by:  As directed      Partial weight bearing    Complete by:  As directed   % Body Weight:  50%  Laterality:  left  Extremity:  Lower     Walk with assistance    Complete by:  As directed      Walker     Complete by:  As directed             Medication List    TAKE these medications        aspirin 325 MG EC tablet  Take 1 tablet (325 mg total) by mouth daily.     B-complex with vitamin C tablet  Take 1 tablet by mouth daily.     donepezil 10 MG tablet  Commonly known as:  ARICEPT  Take  1 tablet (10 mg total) by mouth daily.     feeding supplement (ENSURE ENLIVE) Liqd  Take 237 mLs by mouth 2 (two) times daily between meals.      ferrous sulfate 325 (65 FE) MG tablet  Take 1 tablet (325 mg total) by mouth 2 (two) times daily with a meal.     HYDROcodone-acetaminophen 5-325 MG per tablet  Commonly known as:  NORCO/VICODIN  Take 1-2 tablets by mouth every 6 (six) hours as needed for moderate pain.     levofloxacin 750 MG tablet  Commonly known as:  LEVAQUIN  Take 1 tablet (750 mg total) by mouth daily.     levothyroxine 100 MCG tablet  Commonly known as:  SYNTHROID, LEVOTHROID  TAKE 1 TABLET EVERY DAY     multivitamin tablet  Take 1 tablet by mouth daily.     omeprazole 40 MG capsule  Commonly known as:  PRILOSEC  Take 40 mg by mouth every other day.     ondansetron 4 MG tablet  Commonly known as:  ZOFRAN  Take 1 tablet (4 mg total) by mouth every 6 (six) hours as needed for nausea.     polyethylene glycol packet  Commonly known as:  MIRALAX / GLYCOLAX  Take 17 g by mouth daily.     PROBIOTIC PO  Take 1 tablet by mouth daily.     terazosin 5 MG capsule  Commonly known as:  HYTRIN  Take 1 capsule (5 mg total) by mouth at bedtime.       Follow-up Information    Follow up with Mauri Pole, MD. Schedule an appointment as soon as possible for a visit in 2 weeks.   Specialty:  Orthopedic Surgery   Contact information:   [address] [place] [address] [phone]       Follow up In 2 weeks.   Why:  For wound check and X-rays       Time coordinating discharge: 35 minutes.  Signed:  RAMA,CHRISTINA  Pager [phone] [facility] 12/30/2014, 10:09 AM

## 2014-12-30 NOTE — Clinical Social Work Placement (Signed)
   CLINICAL SOCIAL WORK PLACEMENT  NOTE  Date:  12/30/2014  Patient Details  Name: [name] MRN: [id number] Date of Birth: 05-Apr-1930  Clinical Social Work is seeking post-discharge placement for this patient at the [place] level of care (*CSW will initial, date and re-position this form in  chart as items are completed):  Yes   Patient/family provided with [facility] Work Department's list of facilities offering this level of care within the geographic area requested by the patient (or if unable, by the patient's family).  Yes   Patient/family informed of their freedom to choose among providers that offer the needed level of care, that participate in Medicare, Medicaid or managed care program needed by the patient, have an available bed and are willing to accept the patient.  Yes   Patient/family informed of [facility]'s ownership interest in [facility] and [facility], as well as of the fact that they are under no obligation to receive care at these facilities.  PASRR submitted to EDS on 12/26/14     PASRR number received on 12/26/14     Existing PASRR number confirmed on       FL2 transmitted to all facilities in geographic area requested by pt/family on 12/28/14     FL2 transmitted to all facilities within larger geographic area on       Patient informed that his/her managed care company has contracts with or will negotiate with certain facilities, including the following:        Yes   Patient/family informed of bed offers received.  Patient chooses bed at [facility]     Physician recommends and patient chooses bed at      Patient to be transferred to [facility] on 12/30/14.  Patient to be transferred to facility by [facility]     Patient family notified on 12/30/14 of transfer.  Name of family member notified:  SPOUSE     PHYSICIAN       Additional Comment: Pt / spouse are in agreement with d/c to [facility] today.  [facility] transport is required. Pt / spouse are aware out of pocket costs [place] be associated with [facility] transport. NSG reviewed d/c summary, scripts, avs. Scripts included in d/c packet. D/C Summary sent to SNF for review prior to d/c.   _______________________________________________ Luretha Rued, LCSW 12/30/2014, 3:54 PM

## 2014-12-30 NOTE — Progress Notes (Signed)
Nutrition Follow-up  INTERVENTION:   Continue Ensure Enlive po BID, each supplement provides 350 kcal and 20 grams of protein Encourage PO intake RD to continue to monitor  NUTRITION DIAGNOSIS:   Increased nutrient needs related to wound healing, poor appetite as evidenced by estimated needs, per patient/family report.  Ongoing.  GOAL:   Patient will meet greater than or equal to 90% of their needs  Not meeting.  MONITOR:   PO intake, Supplement acceptance, Labs, Weight trends, Skin, I & O's  REASON FOR ASSESSMENT:   Consult Poor PO  ASSESSMENT:   81 yo male who suffered a mechanical fall after his legs "gave out on him" as he was walking out of the bathroom. Pt had immediate pain in his left knee and the inability to bear weight on that leg. PMH of HTN, Hypothyroidism, Diverticulosis, BPH, Pancreatitis. Pt was receiving a bath when I came to visit. Family gave history. Pt has been experiencing no wt loss, wife reports he has a good appetite normally. Dealing with vomitting as of late his appetite comes and goes with the vomitting. Pt was still active and ambulating prior to fall. Will provide ensure enlive BID following diet advancement to provide extra calories for healing of leg. Status post left ORIF 12/26/14  Pt in room with wife at bedside. Pt's wife reports he has not been eating that much since his surgery. Wife reports he ate bites of french toast and pineapple this morning. Pt has been drinking his Ensure supplements. Pt is to discharge to rehab today. Encouraged wife to provide supplements at home to aid in healing.  Labs reviewed: Elevated BUN  Diet Order:  Diet regular Room service appropriate?: Yes; Fluid consistency:: Thin Diet - low sodium heart healthy  Skin:  Wound (see comment) (hip incision)  Last BM:  9/27  Height:   Ht Readings from Last 1 Encounters:  12/25/14 5\' 10"  (1.778 m)    Weight:   Wt Readings from Last 1 Encounters:  12/25/14 150  lb (68.04 kg)    Ideal Body Weight:  75.45 kg  BMI:  Body mass index is 21.52 kg/(m^2).  Estimated Nutritional Needs:   Kcal:  1800-2000  Protein:  80-90 grams  Fluid:  >=/ 1.8L  EDUCATION NEEDS:   No education needs identified at this time  Clayton Bibles, MS, RD, LDN Pager: [phone] After Hours Pager: [phone]

## 2014-12-30 NOTE — Progress Notes (Signed)
Patient ID: [name], male   DOB: Jan 01, 1931, 81 y.o.   MRN: [id number] Subjective: 4 Days Post-Op Procedure(s) (LRB): OPEN REDUCTION INTERNAL FIXATION HIP (Left)    Patient stable with no reported events.  Remains confused at baseline  Objective:   VITALS:   Filed Vitals:   12/30/14 0430  BP: 148/76  Pulse: 86  Temp: 98.6 F (37 C)  Resp: 16    Neurovascular intact Incision: dressing C/D/I  LABS  Recent Labs  12/28/14 0456 12/29/14 0444  HGB 8.5* 8.0*  HCT 26.1* 24.8*  WBC 12.6* 9.2  PLT 161 195     Recent Labs  12/28/14 0456 12/29/14 0444  NA 135 135  K 4.2 3.8  BUN 37* 29*  CREATININE 1.00 0.85  GLUCOSE 124* 111*    No results for input(s): LABPT, INR in the last 72 hours.   Assessment/Plan: 4 Days Post-Op Procedure(s) (LRB): OPEN REDUCTION INTERNAL FIXATION HIP (Left)   Up with therapy Discharge to SNF when arranged  RTC in 2 weeks for wound check and X-rays

## 2014-12-31 ENCOUNTER — Non-Acute Institutional Stay (SKILLED_NURSING_FACILITY): Payer: Commercial Managed Care - HMO | Admitting: Nurse Practitioner

## 2014-12-31 DIAGNOSIS — K5900 Constipation, unspecified: Secondary | ICD-10-CM | POA: Diagnosis not present

## 2014-12-31 DIAGNOSIS — E039 Hypothyroidism, unspecified: Secondary | ICD-10-CM | POA: Diagnosis not present

## 2014-12-31 DIAGNOSIS — S72142S Displaced intertrochanteric fracture of left femur, sequela: Secondary | ICD-10-CM | POA: Diagnosis not present

## 2014-12-31 DIAGNOSIS — D62 Acute posthemorrhagic anemia: Secondary | ICD-10-CM

## 2014-12-31 DIAGNOSIS — J189 Pneumonia, unspecified organism: Secondary | ICD-10-CM

## 2014-12-31 DIAGNOSIS — R413 Other amnesia: Secondary | ICD-10-CM | POA: Diagnosis not present

## 2014-12-31 LAB — STREP PNEUMONIAE ANTIBODY SEROTYPES
STREP PNEUMONIAE TYPE 14 ABS: 0.6 ug/mL
STREP PNEUMONIAE TYPE 23F ABS: 1.2 ug/mL
STREP PNEUMONIAE TYPE 7F ABS: 0.5 ug/mL
STREP PNEUMONIAE TYPE 9N ABS: 3.9 ug/mL
Strep pneumo Type 19: 2.3 ug/mL
Strep pneumo Type 4: 0.5 ug/mL
Strep pneumo Type 9: 3.4 ug/mL
Strep pneumoniae Type 1 Abs: 1 ug/mL
Strep pneumoniae Type 18C Abs: 2.2 ug/mL
Strep pneumoniae Type 3 Abs: <0.3 ug/mL
Strep pneumoniae Type 5 Abs: 6.6 ug/mL
Strep pneumoniae Type 6B Abs: 4.1 ug/mL
Strep pneumoniae Type 8 Abs: 2.1 ug/mL

## 2014-12-31 NOTE — Progress Notes (Signed)
Patient ID: [name], male   DOB: Jun 08, 1930, 80 y.o.   MRN: [id number]    Nursing Home Location:  [place] of Service: SNF [phone])  PCP: Elsie Stain, MD  Allergies  Allergen Reactions  . Sulfonamide Derivatives Other (See Comments)    Childhood allergy  . Valtrex [Valacyclovir Hcl]     NAV    Chief Complaint  Patient presents with  . Hospitalization Follow-up    HPI:  Patient is a 81 y.o. male seen today at [facility] to follow up hospitalization. Pt with a pmh of HTN, hypothyroid, BPH, dementia. Who had a mechanical fall and was admitted to the hospital 12/25/14 with an acute left femur fracture. He underwent ORIF of his left proximal femur on 12/26/14. Pain controlled on current regimen. Reports constipation despite miralax use. His initial chest x-ray also showed findings consistent with community-acquired pneumonia and is currently being treated with Levaquin for 7 days. Remains on O2 at 2L. At [facility] place for short term rehab for gait and strength training after ORIF.   Review of Systems:  Review of Systems  Constitutional: Negative for activity change, appetite change, fatigue and unexpected weight change.  HENT: Negative for congestion and hearing loss.   Eyes: Negative.   Respiratory: Negative for cough and shortness of breath.   Cardiovascular: Negative for chest pain, palpitations and leg swelling.  Gastrointestinal: Positive for constipation. Negative for abdominal pain and diarrhea.  Genitourinary: Negative for dysuria and difficulty urinating.  Musculoskeletal:       Pain managed on current regimen   Skin: Negative for color change and wound.  Neurological: Negative for dizziness and weakness.  Psychiatric/Behavioral: Negative for behavioral problems, confusion and agitation.    Past Medical History  Diagnosis Date  . Hypertension   . Hypothyroidism   . Diverticulosis of colon   . BPH (benign prostatic  hypertrophy)   . Pancreatitis 2011    and gallstone pancreatitis and Ecoli bacteremia  . 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 followed by [name]- observation as of 09/2010  . Rabbit fever 1940  . History of shingles   . Dementia    Past Surgical History  Procedure Laterality Date  . Appendectomy  1959  . Thyroidectomy, partial  1966  . Knee arthroscopy  06/1995    left Dr Derrel Nip  . Joint replacement  04/30/2001    bilateral TKR  . Cholecystectomy  10/2009  . Eye muscle surgery      12/12 had double vision Dr. Juleen China at [facility]  . Orif hip fracture Left 12/26/2014    Procedure: OPEN REDUCTION INTERNAL FIXATION HIP;  Surgeon: Paralee Cancel, MD;  Location: [facility];  Service: Orthopedics;  Laterality: Left;   Social History:   reports that he has never smoked. He has never used smokeless tobacco. He reports that he does not drink alcohol or use illicit drugs.  Family History  Problem Relation Age of Onset  . Hypertension Mother   . Stroke Mother   . Alcohol abuse Father   . Cancer Brother     prostate CA  . Heart disease Brother     Medications: Patient's Medications  New Prescriptions   No medications on file  Previous Medications   ASPIRIN EC 325 MG EC TABLET    Take 1 tablet (325 mg total) by mouth daily.   B COMPLEX-C (B-COMPLEX WITH VITAMIN C) TABLET  Take 1 tablet by mouth daily.   DONEPEZIL (ARICEPT) 10 MG TABLET    Take 1 tablet (10 mg total) by mouth daily.   FEEDING SUPPLEMENT, ENSURE ENLIVE, (ENSURE ENLIVE) LIQD    Take 237 mLs by mouth 2 (two) times daily between meals.   FERROUS SULFATE 325 (65 FE) MG TABLET    Take 1 tablet (325 mg total) by mouth 2 (two) times daily with a meal.   HYDROCODONE-ACETAMINOPHEN (NORCO/VICODIN) 5-325 MG PER TABLET    Take 1-2 tablets by mouth every 6 (six) hours as needed for moderate pain.   LEVOFLOXACIN (LEVAQUIN) 750 MG TABLET    Take 1 tablet (750 mg total) by mouth daily.   LEVOTHYROXINE (SYNTHROID,  LEVOTHROID) 100 MCG TABLET    TAKE 1 TABLET EVERY DAY   MULTIPLE VITAMIN (MULTIVITAMIN) TABLET    Take 1 tablet by mouth daily.     OMEPRAZOLE (PRILOSEC) 40 MG CAPSULE    Take 40 mg by mouth every other day.    ONDANSETRON (ZOFRAN) 4 MG TABLET    Take 1 tablet (4 mg total) by mouth every 6 (six) hours as needed for nausea.   POLYETHYLENE GLYCOL (MIRALAX / GLYCOLAX) PACKET    Take 17 g by mouth daily.   PROBIOTIC PRODUCT (PROBIOTIC PO)    Take 1 tablet by mouth daily.   TERAZOSIN (HYTRIN) 5 MG CAPSULE    Take 1 capsule (5 mg total) by mouth at bedtime.  Modified Medications   No medications on file  Discontinued Medications   No medications on file     Physical Exam: Filed Vitals:   12/31/14 1309  BP: 143/78  Pulse: 78  Temp: 98.3 F (36.8 C)  Resp: 18  SpO2: 93%    Physical Exam  Constitutional: He is oriented to person, place, and time. He appears well-developed and well-nourished. No distress.  HENT:  Head: Normocephalic and atraumatic.  Mouth/Throat: Oropharynx is clear and moist. No oropharyngeal exudate.  Eyes: Conjunctivae and EOM are normal. Pupils are equal, round, and reactive to light.  Neck: Normal range of motion. Neck supple.  Cardiovascular: Normal rate, regular rhythm and normal heart sounds.   Pulmonary/Chest: Effort normal and breath sounds normal.  Abdominal: Soft. Bowel sounds are normal.  Musculoskeletal: He exhibits no edema or tenderness.  Neurological: He is alert and oriented to person, place, and time.  Skin: Skin is warm and dry. He is not diaphoretic.  Left hip incision with dressing covering, CDI, surrounding area with ecchymosis noted  Psychiatric: He has a normal mood and affect.    Labs reviewed: Basic Metabolic Panel:  Recent Labs  12/27/14 0543 12/28/14 0456 12/29/14 0444  NA 140 135 135  K 4.4 4.2 3.8  CL 106 102 100*  CO2 26 26 28   GLUCOSE 153* 124* 111*  BUN 36* 37* 29*  CREATININE 1.11 1.00 0.85  CALCIUM 8.5* 8.1* 8.1*    Liver Function Tests:  Recent Labs  05/09/14 1610 12/27/14 0543 12/29/14 0444  AST 16 22 24   ALT 12 13* 11*  ALKPHOS 65 52 54  BILITOT 0.8 0.7 0.4  PROT 6.9 5.5* 5.5*  ALBUMIN 4.0 3.0* 2.9*    Recent Labs  01/12/14 2049 05/09/14 1610  LIPASE 22 10   No results for input(s): AMMONIA in the last 8760 hours. CBC:  Recent Labs  01/12/14 2049 05/09/14 1610 12/25/14 1951 12/27/14 0543 12/28/14 0456 12/29/14 0444 12/30/14 0935  WBC 6.7 7.2 14.5* 12.5* 12.6* 9.2  --  NEUTROABS 4.3 5.1 12.7*  --   --   --   --   HGB 13.7 13.6 12.5* 9.5* 8.5* 8.0* 8.4*  HCT 41.3 40.5 38.1* 29.2* 26.1* 24.8* 25.9*  MCV 81.6 80.5 82.1 83.4 83.7 82.9  --   PLT 237 265 200 175 161 195  --    TSH: No results for input(s): TSH in the last 8760 hours. A1C: Lab Results  Component Value Date   HGBA1C 5.5 07/12/2012   Lipid Panel: No results for input(s): CHOL, HDL, LDLCALC, TRIG, CHOLHDL, LDLDIRECT in the last 8760 hours.  Radiological Exams: Dg Knee 1-2 Views Left  12/25/2014   CLINICAL DATA:  [place] at [place], patient fell injuring LEFT knee, history of dementia and knee replacement surgery  EXAM: LEFT KNEE - 1-2 VIEW  COMPARISON:  11/29/2014  FINDINGS: Diffuse osseous demineralization.  Components of LEFT knee prosthesis in expected positions.  No definite fracture, dislocation or bone destruction.  No periprosthetic lucency or joint effusion.  IMPRESSION: LEFT knee prosthesis and osseous demineralization without acute abnormalities.   Electronically Signed   By: Lavonia Dana M.D.   On: 12/25/2014 21:02   Dg Chest Port 1 View  12/26/2014   CLINICAL DATA:  Leukocytosis.  EXAM: PORTABLE CHEST 1 VIEW  COMPARISON:  09/29/2013  FINDINGS: Left lower lobe airspace opacity noted concerning for pneumonia. Minimal right base atelectasis. Heart is normal size. No effusions. No acute bony abnormality.  IMPRESSION: Left lower lobe opacity concerning for pneumonia.  Right base atelectasis.    Electronically Signed   By: Rolm Baptise M.D.   On: 12/26/2014 13:36   Dg C-arm 1-60 Min-no Report  12/26/2014   CLINICAL DATA: surgery   C-ARM 1-60 MINUTES  Fluoroscopy was utilized by the requesting physician.  No radiographic  interpretation.    Dg Hip Operative Unilat With Pelvis Left  12/26/2014   CLINICAL DATA:  ORIF of proximal left femoral fracture  EXAM: DG C-ARM 1-60 MIN-NO REPORT; OPERATIVE LEFT HIP WITH PELVIS  COMPARISON:  None.  FLUOROSCOPY TIME:  Radiation Exposure Index (as provided by the fluoroscopic device): Not available  If the device does not provide the exposure index:  Fluoroscopy Time:  59 seconds  Number of Acquired Images:  3  FINDINGS: A proximal medullary rod is noted with fixation screw. The fracture fragments are in near anatomic alignment.  IMPRESSION: ORIF of left femoral fracture   Electronically Signed   By: Inez Catalina M.D.   On: 12/26/2014 19:55   Dg Hip Unilat With Pelvis 2-3 Views Left  12/25/2014   CLINICAL DATA:  EMS from airport where his legs "gave out" and he fell, injuring the left knee. Hx of bilateral TKA and dementia.  EXAM: DG HIP (WITH OR WITHOUT PELVIS) 2-3V LEFT  COMPARISON:  None.  FINDINGS: There is a comminuted displaced fracture of the proximal left femur. The fractures intertrochanteric. There separate fracture components of the lesser greater trochanter. The primary fracture components are displaced by approximately 16 mm, the shaft fracture component displacing laterally and mildly retracting superiorly. Lesser and greater trochanter fracture components are also mildly displaced. There is mild varus angulation.  No other fractures. Hip joint is normally spaced and aligned. Bones are diffusely demineralized.  IMPRESSION: Comminuted displaced intertrochanteric fracture of the proximal left femur.   Electronically Signed   By: Lajean Manes M.D.   On: 12/25/2014 21:00    Assessment/Plan 1. Intertrochanteric fracture of left femur, sequela -Status  post left ORIF  12/26/14. Pain well controlled on current regimen - Continues on OT/PT for gait and strength training while at SNF.  2. Hypothyroidism, unspecified hypothyroidism type -conts on synthroid 100 mcg daily   3. Constipation, unspecified constipation type -ongoing issues, currently on miralax 17 gm daily -will add senna S 2 tablets PO qhs -encouraged hydration  4. CAP (community acquired pneumonia) Will need follow up chest xray in 4 weeks to ensure resolution of pneumonia -conts on Levaquin for 7 day course -encouraged deep breathing -to wean O2 for sats greater than 92 %  5. Acute blood loss anemia -conts on ferrous sulfate 325 mg BID, will follow up CBC on 10/2  6. Memory loss -lives at home with wife. conts on aricept 10 mg daily      Malaak Stach K. Harle Battiest  [facility] [phone] 8 am - 5 pm) [phone] (after hours)

## 2015-01-06 ENCOUNTER — Non-Acute Institutional Stay (SKILLED_NURSING_FACILITY): Payer: Commercial Managed Care - HMO | Admitting: Internal Medicine

## 2015-01-06 DIAGNOSIS — K5900 Constipation, unspecified: Secondary | ICD-10-CM | POA: Diagnosis not present

## 2015-01-06 DIAGNOSIS — F0390 Unspecified dementia without behavioral disturbance: Secondary | ICD-10-CM

## 2015-01-06 DIAGNOSIS — D62 Acute posthemorrhagic anemia: Secondary | ICD-10-CM | POA: Diagnosis not present

## 2015-01-06 DIAGNOSIS — R600 Localized edema: Secondary | ICD-10-CM | POA: Diagnosis not present

## 2015-01-06 DIAGNOSIS — R2681 Unsteadiness on feet: Secondary | ICD-10-CM | POA: Diagnosis not present

## 2015-01-06 DIAGNOSIS — J189 Pneumonia, unspecified organism: Secondary | ICD-10-CM | POA: Diagnosis not present

## 2015-01-06 DIAGNOSIS — S72142S Displaced intertrochanteric fracture of left femur, sequela: Secondary | ICD-10-CM

## 2015-01-06 DIAGNOSIS — K219 Gastro-esophageal reflux disease without esophagitis: Secondary | ICD-10-CM

## 2015-01-06 DIAGNOSIS — E46 Unspecified protein-calorie malnutrition: Secondary | ICD-10-CM

## 2015-01-06 LAB — CBC AND DIFFERENTIAL
HCT: 30 % — AB (ref 41–53)
Hemoglobin: 9 g/dL — AB (ref 13.5–17.5)
PLATELETS: 444 10*3/uL — AB (ref 150–399)
WBC: 7.6 10*3/mL

## 2015-01-06 LAB — BASIC METABOLIC PANEL
BUN: 32 mg/dL — AB (ref 4–21)
CREATININE: 1.3 mg/dL (ref 0.6–1.3)
Glucose: 87 mg/dL
POTASSIUM: 4.2 mmol/L (ref 3.4–5.3)
Sodium: 143 mmol/L (ref 137–147)

## 2015-01-06 NOTE — Progress Notes (Signed)
Patient ID: Stephone Gum Retter, male   DOB: July 01, 1930, 81 y.o.   MRN: [id number]     Facility: [facility]    PCP: Elsie Stain, MD  Code Status: full code  Allergies  Allergen Reactions  . Sulfonamide Derivatives Other (See Comments)    Childhood allergy  . Valtrex [Valacyclovir Hcl]     NAV    Chief Complaint  Patient presents with  . New Admit To SNF     HPI:  81 y.o. patient is here for short term rehabilitation post hospital admission from 12/25/14-12/30/14 with intertrochanteric fracture of left femur after a fall at home. He also had community acquired pneumonia. He underwent ORIF on 12/26/14 and was started on antibiotics. he also had a drop in her hemoglobin and was started on iron supplement. He is seen in her room today. His breathing is better. He is on o2 and has occasional cough with yellow phlegm. Has been constipated. Pain is under control with current regimen. His wife is present at bedside.  Review of Systems:  Constitutional: Negative for fever, chills, diaphoresis.  HENT: Negative for headache, congestion, nasal discharge, difficulty swallowing.   Eyes: Negative for eye pain, blurred vision, double vision and discharge.  Respiratory: Negative for wheezing.   Cardiovascular: Negative for chest pain, palpitations, leg swelling.  Gastrointestinal: Negative for heartburn, nausea, vomiting, abdominal pain.last bowel movement 3 days back. At home has bowel movement daily Genitourinary: Negative for dysuria, flank pain.  Musculoskeletal: Negative for back pain, falls in facility Skin: Negative for itching, rash.  Neurological: Negative for dizziness, tingling, focal weakness Psychiatric/Behavioral: Negative for depression    Past Medical History  Diagnosis Date  . Hypertension   . Hypothyroidism   . Diverticulosis of colon   . BPH (benign prostatic hypertrophy)   . Pancreatitis 2011    and gallstone pancreatitis and Ecoli bacteremia    . 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 followed by [name]- observation as of 09/2010  . Rabbit fever 1940  . History of shingles   . Dementia    Past Surgical History  Procedure Laterality Date  . Appendectomy  1959  . Thyroidectomy, partial  1966  . Knee arthroscopy  06/1995    left Dr Derrel Nip  . Joint replacement  04/30/2001    bilateral TKR  . Cholecystectomy  10/2009  . Eye muscle surgery      12/12 had double vision Dr. Juleen China at [facility]  . Orif hip fracture Left 12/26/2014    Procedure: OPEN REDUCTION INTERNAL FIXATION HIP;  Surgeon: Paralee Cancel, MD;  Location: [facility];  Service: Orthopedics;  Laterality: Left;   Social History:   reports that he has never smoked. He has never used smokeless tobacco. He reports that he does not drink alcohol or use illicit drugs.  Family History  Problem Relation Age of Onset  . Hypertension Mother   . Stroke Mother   . Alcohol abuse Father   . Cancer Brother     prostate CA  . Heart disease Brother     Medications:   Medication List       This list is accurate as of: 01/06/15  6:21 PM.  Always use your most recent med list.               aspirin 325 MG EC tablet  Take 1 tablet (325 mg total) by mouth daily.     B-complex with vitamin C  tablet  Take 1 tablet by mouth daily.     donepezil 10 MG tablet  Commonly known as:  ARICEPT  Take 1 tablet (10 mg total) by mouth daily.     feeding supplement (ENSURE ENLIVE) Liqd  Take 237 mLs by mouth 2 (two) times daily between meals.     ferrous sulfate 325 (65 FE) MG tablet  Take 1 tablet (325 mg total) by mouth 2 (two) times daily with a meal.     HYDROcodone-acetaminophen 5-325 MG tablet  Commonly known as:  NORCO/VICODIN  Take 1-2 tablets by mouth every 6 (six) hours as needed for moderate pain.     levothyroxine 100 MCG tablet  Commonly known as:  SYNTHROID, LEVOTHROID  TAKE 1 TABLET EVERY DAY     multivitamin tablet  Take 1 tablet by mouth  daily.     omeprazole 40 MG capsule  Commonly known as:  PRILOSEC  Take 40 mg by mouth every other day.     ondansetron 4 MG tablet  Commonly known as:  ZOFRAN  Take 1 tablet (4 mg total) by mouth every 6 (six) hours as needed for nausea.     polyethylene glycol packet  Commonly known as:  MIRALAX / GLYCOLAX  Take 17 g by mouth daily.     PROBIOTIC PO  Take 1 tablet by mouth daily.     sennosides-docusate sodium 8.6-50 MG tablet  Commonly known as:  SENOKOT-S  Take 2 tablets by mouth daily.     terazosin 5 MG capsule  Commonly known as:  HYTRIN  Take 1 capsule (5 mg total) by mouth at bedtime.         Physical Exam: Filed Vitals:   01/06/15 1811  BP: 134/76  Pulse: 70  Temp: 97.9 F (36.6 C)  Resp: 18  SpO2: 95%    General- elderly male, well built, in no acute distress Head- normocephalic, atraumatic Nose- normal nasal mucosa, no maxillary or frontal sinus tenderness, no nasal discharge Throat- moist mucus membrane Eyes- PERRLA, EOMI, no pallor, no icterus, no discharge, normal conjunctiva, normal sclera Neck- no cervical lymphadenopathy Cardiovascular- normal s1,s2, no murmurs, palpable dorsalis pedis and radial pulses, left leg edema 1+ Respiratory- bilateral clear to auscultation, no wheeze, + rhonchi, no crackles, no use of accessory muscles, o2 Abdomen- bowel sounds present, soft, non tender Musculoskeletal- able to move all 4 extremities, limited left hip range of motion, generalized weakness and unsteady gait Neurological- no focal deficit, alert and oriented to person Skin- warm and dry, left hip surgical incision with dressing clean and dry Psychiatry- normal mood and affect    Labs reviewed: Basic Metabolic Panel:  Recent Labs  12/27/14 0543 12/28/14 0456 12/29/14 0444  NA 140 135 135  K 4.4 4.2 3.8  CL 106 102 100*  CO2 26 26 28   GLUCOSE 153* 124* 111*  BUN 36* 37* 29*  CREATININE 1.11 1.00 0.85  CALCIUM 8.5* 8.1* 8.1*   Liver  Function Tests:  Recent Labs  05/09/14 1610 12/27/14 0543 12/29/14 0444  AST 16 22 24   ALT 12 13* 11*  ALKPHOS 65 52 54  BILITOT 0.8 0.7 0.4  PROT 6.9 5.5* 5.5*  ALBUMIN 4.0 3.0* 2.9*    Recent Labs  01/12/14 2049 05/09/14 1610  LIPASE 22 10   No results for input(s): AMMONIA in the last 8760 hours. CBC:  Recent Labs  01/12/14 2049 05/09/14 1610 12/25/14 1951 12/27/14 0543 12/28/14 0456 12/29/14 0444 12/30/14 0935  WBC 6.7 7.2  14.5* 12.5* 12.6* 9.2  --   NEUTROABS 4.3 5.1 12.7*  --   --   --   --   HGB 13.7 13.6 12.5* 9.5* 8.5* 8.0* 8.4*  HCT 41.3 40.5 38.1* 29.2* 26.1* 24.8* 25.9*  MCV 81.6 80.5 82.1 83.4 83.7 82.9  --   PLT 237 265 200 175 161 195  --    01/05/15 wbc 7.6, hb 9, hct 29.5, plt 444, na 143, k 4.2, glu 87, bun 32, cr 1.27, ca 8.6   Radiological Exams: Dg Knee 1-2 Views Left  12/25/2014   CLINICAL DATA:  [place], patient fell injuring LEFT knee, history of dementia and knee replacement surgery  EXAM: LEFT KNEE - 1-2 VIEW  COMPARISON:  11/29/2014  FINDINGS: Diffuse osseous demineralization.  Components of LEFT knee prosthesis in expected positions.  No definite fracture, dislocation or bone destruction.  No periprosthetic lucency or joint effusion.  IMPRESSION: LEFT knee prosthesis and osseous demineralization without acute abnormalities.   Electronically Signed   By: Lavonia Dana M.D.   On: 12/25/2014 21:02   Dg Chest Port 1 View  12/26/2014   CLINICAL DATA:  Leukocytosis.  EXAM: PORTABLE CHEST 1 VIEW  COMPARISON:  09/29/2013  FINDINGS: Left lower lobe airspace opacity noted concerning for pneumonia. Minimal right base atelectasis. Heart is normal size. No effusions. No acute bony abnormality.  IMPRESSION: Left lower lobe opacity concerning for pneumonia.  Right base atelectasis.   Electronically Signed   By: Rolm Baptise M.D.   On: 12/26/2014 13:36   Dg C-arm 1-60 Min-no Report  12/26/2014   CLINICAL DATA: surgery   C-ARM 1-60 MINUTES   Fluoroscopy was utilized by the requesting physician.  No radiographic  interpretation.    Dg Hip Operative Unilat With Pelvis Left  12/26/2014   CLINICAL DATA:  ORIF of proximal left femoral fracture  EXAM: DG C-ARM 1-60 MIN-NO REPORT; OPERATIVE LEFT HIP WITH PELVIS  COMPARISON:  None.  FLUOROSCOPY TIME:  Radiation Exposure Index (as provided by the fluoroscopic device): Not available  If the device does not provide the exposure index:  Fluoroscopy Time:  59 seconds  Number of Acquired Images:  3  FINDINGS: A proximal medullary rod is noted with fixation screw. The fracture fragments are in near anatomic alignment.  IMPRESSION: ORIF of left femoral fracture   Electronically Signed   By: Inez Catalina M.D.   On: 12/26/2014 19:55   Dg Hip Unilat With Pelvis 2-3 Views Left  12/25/2014   CLINICAL DATA:  EMS from airport where his legs "gave out" and he fell, injuring the left knee. Hx of bilateral TKA and dementia.  EXAM: DG HIP (WITH OR WITHOUT PELVIS) 2-3V LEFT  COMPARISON:  None.  FINDINGS: There is a comminuted displaced fracture of the proximal left femur. The fractures intertrochanteric. There separate fracture components of the lesser greater trochanter. The primary fracture components are displaced by approximately 16 mm, the shaft fracture component displacing laterally and mildly retracting superiorly. Lesser and greater trochanter fracture components are also mildly displaced. There is mild varus angulation.  No other fractures. Hip joint is normally spaced and aligned. Bones are diffusely demineralized.  IMPRESSION: Comminuted displaced intertrochanteric fracture of the proximal left femur.   Electronically Signed   By: Lajean Manes M.D.   On: 12/25/2014 21:00     Assessment/Plan  Unsteady gait With recent left femur intertrochanteric fracture, s/p surgical repair. Will have patient work with PT/OT as tolerated to regain strength  and restore function.  Fall precautions are in place.  Left  femur intertrochanteric fracture S/p ORIF 12/26/14. Has f/u with orthopedics. Continue norco 5-325 mg 1-2 tab q6h prn pain and aspirin 325 mg daily for dvt prophylaxis. Will have him work with physical therapy and occupational therapy team to help with gait training and muscle strengthening exercises.fall precautions. Skin care. Encourage to be out of bed. PWB to LLE for now.  Blood loss anemia Post op, hb improved to 9. Monitor h&h. Continue feso4 325 mg bid  Leg edema Add ted hose  CAP Completed course of levaquin yesterday. Add incentive spirometer for now and monitor. Wean o2 as tolerated.  Constipation On senna s 2 tab qhs with miralax daily. Change miralax to bid for now and reassess  Dementia Without behavioral disturbance, continue aricpet  Protein calorie malnutrition Monitor po intake, continue ensure supple,emt with multivitamin and b complex  gerd Stable symptom, continue prilosec current regimen  Goals of care: short term rehabilitation   Labs/tests ordered: cbc 1 week  Family/ staff Communication: reviewed care plan with patient and nursing supervisor    Blanchie Serve, MD  [facility] [phone] (Monday-Friday 8 am - 5 pm) [phone] (afterhours)

## 2015-01-19 ENCOUNTER — Non-Acute Institutional Stay (SKILLED_NURSING_FACILITY): Payer: Commercial Managed Care - HMO | Admitting: Nurse Practitioner

## 2015-01-19 DIAGNOSIS — E46 Unspecified protein-calorie malnutrition: Secondary | ICD-10-CM

## 2015-01-19 DIAGNOSIS — E039 Hypothyroidism, unspecified: Secondary | ICD-10-CM | POA: Diagnosis not present

## 2015-01-19 DIAGNOSIS — S72142S Displaced intertrochanteric fracture of left femur, sequela: Secondary | ICD-10-CM

## 2015-01-19 DIAGNOSIS — J189 Pneumonia, unspecified organism: Secondary | ICD-10-CM | POA: Diagnosis not present

## 2015-01-19 DIAGNOSIS — F0390 Unspecified dementia without behavioral disturbance: Secondary | ICD-10-CM

## 2015-01-19 DIAGNOSIS — K219 Gastro-esophageal reflux disease without esophagitis: Secondary | ICD-10-CM | POA: Diagnosis not present

## 2015-01-19 DIAGNOSIS — D62 Acute posthemorrhagic anemia: Secondary | ICD-10-CM

## 2015-01-19 DIAGNOSIS — K5900 Constipation, unspecified: Secondary | ICD-10-CM

## 2015-01-19 NOTE — Progress Notes (Signed)
Patient ID: [name], male   DOB: 05-Feb-1931, 81 y.o.   MRN: [id number]    Nursing Home Location:  [place] of Service: SNF [phone])  PCP: Elsie Stain, MD  Allergies  Allergen Reactions  . Sulfonamide Derivatives Other (See Comments)    Childhood allergy  . Valtrex [Valacyclovir Hcl]     NAV    Chief Complaint  Patient presents with  . Discharge Note    HPI:  Patient is a 81 y.o. male seen today at [facility] to follow up hospitalization. Pt with a pmh of HTN, hypothyroid, BPH, dementia. Who had a mechanical fall and was admitted to the hospital 12/25/14 with an acute left femur fracture. He underwent ORIF of his left proximal femur on 12/26/14. Pain controlled on current regimen. Patient currently doing well with therapy, now stable to discharge home with home health.  Review of Systems:  Review of Systems  Constitutional: Negative for activity change, appetite change, fatigue and unexpected weight change.  HENT: Negative for congestion and hearing loss.   Eyes: Negative.   Respiratory: Negative for cough and shortness of breath.   Cardiovascular: Negative for chest pain, palpitations and leg swelling.  Gastrointestinal: Negative for abdominal pain, diarrhea and constipation.  Genitourinary: Negative for dysuria and difficulty urinating.  Musculoskeletal:       Pain managed on current regimen   Skin: Negative for color change and wound.  Neurological: Negative for dizziness and weakness.  Psychiatric/Behavioral: Negative for behavioral problems, confusion and agitation.    Past Medical History  Diagnosis Date  . Hypertension   . Hypothyroidism   . Diverticulosis of colon   . BPH (benign prostatic hypertrophy)   . Pancreatitis 2011    and gallstone pancreatitis and Ecoli bacteremia  . 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 followed by [name]- observation as of 09/2010  . Rabbit fever 1940  .  History of shingles   . Dementia    Past Surgical History  Procedure Laterality Date  . Appendectomy  1959  . Thyroidectomy, partial  1966  . Knee arthroscopy  06/1995    left Dr Derrel Nip  . Joint replacement  04/30/2001    bilateral TKR  . Cholecystectomy  10/2009  . Eye muscle surgery      12/12 had double vision Dr. Juleen China at [facility]  . Orif hip fracture Left 12/26/2014    Procedure: OPEN REDUCTION INTERNAL FIXATION HIP;  Surgeon: Paralee Cancel, MD;  Location: [facility];  Service: Orthopedics;  Laterality: Left;   Social History:   reports that he has never smoked. He has never used smokeless tobacco. He reports that he does not drink alcohol or use illicit drugs.  Family History  Problem Relation Age of Onset  . Hypertension Mother   . Stroke Mother   . Alcohol abuse Father   . Cancer Brother     prostate CA  . Heart disease Brother     Medications: Patient's Medications  New Prescriptions   No medications on file  Previous Medications   ASPIRIN EC 325 MG EC TABLET    Take 1 tablet (325 mg total) by mouth daily.   B COMPLEX-C (B-COMPLEX WITH VITAMIN C) TABLET    Take 1 tablet by mouth daily.   DONEPEZIL (ARICEPT) 10 MG TABLET    Take 1 tablet (10 mg total) by mouth daily.   FEEDING SUPPLEMENT, ENSURE ENLIVE, (ENSURE  ENLIVE) LIQD    Take 237 mLs by mouth 2 (two) times daily between meals.   FERROUS SULFATE 325 (65 FE) MG TABLET    Take 1 tablet (325 mg total) by mouth 2 (two) times daily with a meal.   HYDROCODONE-ACETAMINOPHEN (NORCO/VICODIN) 5-325 MG PER TABLET    Take 1-2 tablets by mouth every 6 (six) hours as needed for moderate pain.   LEVOTHYROXINE (SYNTHROID, LEVOTHROID) 100 MCG TABLET    TAKE 1 TABLET EVERY DAY   MULTIPLE VITAMIN (MULTIVITAMIN) TABLET    Take 1 tablet by mouth daily.     OMEPRAZOLE (PRILOSEC) 40 MG CAPSULE    Take 40 mg by mouth every other day.    ONDANSETRON (ZOFRAN) 4 MG TABLET    Take 1 tablet (4 mg total) by mouth every 6 (six) hours as needed for  nausea.   POLYETHYLENE GLYCOL (MIRALAX / GLYCOLAX) PACKET    Take 17 g by mouth daily.   PROBIOTIC PRODUCT (PROBIOTIC PO)    Take 1 tablet by mouth daily.   SENNOSIDES-DOCUSATE SODIUM (SENOKOT-S) 8.6-50 MG TABLET    Take 2 tablets by mouth daily.   TERAZOSIN (HYTRIN) 5 MG CAPSULE    Take 1 capsule (5 mg total) by mouth at bedtime.  Modified Medications   No medications on file  Discontinued Medications   No medications on file     Physical Exam: Filed Vitals:   01/19/15 1518  BP: 144/84  Pulse: 86  Temp: 98.8 F (37.1 C)  Resp: 20    Physical Exam  Constitutional: He is oriented to person, place, and time. He appears well-developed and well-nourished. No distress.  HENT:  Head: Normocephalic and atraumatic.  Mouth/Throat: Oropharynx is clear and moist. No oropharyngeal exudate.  Eyes: Conjunctivae and EOM are normal. Pupils are equal, round, and reactive to light.  Neck: Normal range of motion. Neck supple.  Cardiovascular: Normal rate, regular rhythm and normal heart sounds.   Pulmonary/Chest: Effort normal and breath sounds normal.  Abdominal: Soft. Bowel sounds are normal.  Musculoskeletal: He exhibits no edema or tenderness.  Neurological: He is alert and oriented to person, place, and time.  Skin: Skin is warm and dry. He is not diaphoretic.  Left hip incision well healed  Psychiatric: He has a normal mood and affect.    Labs reviewed: Basic Metabolic Panel:  Recent Labs  12/27/14 0543 12/28/14 0456 12/29/14 0444 01/06/15  NA 140 135 135 143  K 4.4 4.2 3.8 4.2  CL 106 102 100*  --   CO2 26 26 28   --   GLUCOSE 153* 124* 111*  --   BUN 36* 37* 29* 32*  CREATININE 1.11 1.00 0.85 1.3  CALCIUM 8.5* 8.1* 8.1*  --    Liver Function Tests:  Recent Labs  05/09/14 1610 12/27/14 0543 12/29/14 0444  AST 16 22 24   ALT 12 13* 11*  ALKPHOS 65 52 54  BILITOT 0.8 0.7 0.4  PROT 6.9 5.5* 5.5*  ALBUMIN 4.0 3.0* 2.9*    Recent Labs  05/09/14 1610  LIPASE 10     No results for input(s): AMMONIA in the last 8760 hours. CBC:  Recent Labs  05/09/14 1610 12/25/14 1951 12/27/14 0543 12/28/14 0456 12/29/14 0444 12/30/14 0935 01/06/15  WBC 7.2 14.5* 12.5* 12.6* 9.2  --  7.6  NEUTROABS 5.1 12.7*  --   --   --   --   --   HGB 13.6 12.5* 9.5* 8.5* 8.0* 8.4* 9.0*  HCT 40.5  38.1* 29.2* 26.1* 24.8* 25.9* 30*  MCV 80.5 82.1 83.4 83.7 82.9  --   --   PLT 265 200 175 161 195  --  444*   TSH: No results for input(s): TSH in the last 8760 hours. A1C: Lab Results  Component Value Date   HGBA1C 5.5 07/12/2012   Lipid Panel: No results for input(s): CHOL, HDL, LDLCALC, TRIG, CHOLHDL, LDLDIRECT in the last 8760 hours.  Radiological Exams: Dg Knee 1-2 Views Left  12/25/2014   CLINICAL DATA:  [place], patient fell injuring LEFT knee, history of dementia and knee replacement surgery  EXAM: LEFT KNEE - 1-2 VIEW  COMPARISON:  11/29/2014  FINDINGS: Diffuse osseous demineralization.  Components of LEFT knee prosthesis in expected positions.  No definite fracture, dislocation or bone destruction.  No periprosthetic lucency or joint effusion.  IMPRESSION: LEFT knee prosthesis and osseous demineralization without acute abnormalities.   Electronically Signed   By: Lavonia Dana M.D.   On: 12/25/2014 21:02   Dg Chest Port 1 View  12/26/2014   CLINICAL DATA:  Leukocytosis.  EXAM: PORTABLE CHEST 1 VIEW  COMPARISON:  09/29/2013  FINDINGS: Left lower lobe airspace opacity noted concerning for pneumonia. Minimal right base atelectasis. Heart is normal size. No effusions. No acute bony abnormality.  IMPRESSION: Left lower lobe opacity concerning for pneumonia.  Right base atelectasis.   Electronically Signed   By: Rolm Baptise M.D.   On: 12/26/2014 13:36   Dg C-arm 1-60 Min-no Report  12/26/2014   CLINICAL DATA: surgery   C-ARM 1-60 MINUTES  Fluoroscopy was utilized by the requesting physician.  No radiographic  interpretation.    Dg Hip Operative Unilat  With Pelvis Left  12/26/2014   CLINICAL DATA:  ORIF of proximal left femoral fracture  EXAM: DG C-ARM 1-60 MIN-NO REPORT; OPERATIVE LEFT HIP WITH PELVIS  COMPARISON:  None.  FLUOROSCOPY TIME:  Radiation Exposure Index (as provided by the fluoroscopic device): Not available  If the device does not provide the exposure index:  Fluoroscopy Time:  59 seconds  Number of Acquired Images:  3  FINDINGS: A proximal medullary rod is noted with fixation screw. The fracture fragments are in near anatomic alignment.  IMPRESSION: ORIF of left femoral fracture   Electronically Signed   By: Inez Catalina M.D.   On: 12/26/2014 19:55   Dg Hip Unilat With Pelvis 2-3 Views Left  12/25/2014   CLINICAL DATA:  EMS from airport where his legs "gave out" and he fell, injuring the left knee. Hx of bilateral TKA and dementia.  EXAM: DG HIP (WITH OR WITHOUT PELVIS) 2-3V LEFT  COMPARISON:  None.  FINDINGS: There is a comminuted displaced fracture of the proximal left femur. The fractures intertrochanteric. There separate fracture components of the lesser greater trochanter. The primary fracture components are displaced by approximately 16 mm, the shaft fracture component displacing laterally and mildly retracting superiorly. Lesser and greater trochanter fracture components are also mildly displaced. There is mild varus angulation.  No other fractures. Hip joint is normally spaced and aligned. Bones are diffusely demineralized.  IMPRESSION: Comminuted displaced intertrochanteric fracture of the proximal left femur.   Electronically Signed   By: Lajean Manes M.D.   On: 12/25/2014 21:00    Assessment/Plan 1. Intertrochanteric fracture of left femur, sequela -S/p ORIF 12/26/14 -Pain controlled with norco 5-325 mg 1-2 tab q6h prn -cont with aspirin 325 mg daily for dvt prophylaxis -conts PWB to LLE for now, to follow up  with ortho  2. CAP (community acquired pneumonia) -resolved, completed antibiotic and has weaned from O2  3.  Constipation, unspecified constipation type -well controlled on current regimen.  4. Acute blood loss anemia Stable, cont iron twice daily  5. Protein-calorie malnutrition ([place]) -cont supplement, with MVI  6. Gastroesophageal reflux disease, esophagitis presence not specified Stable on omeprazole daily  7. Dementia without behavioral disturbance Home with wife who helps with care, cont on aricept  8. Hypothyroidism, unspecified hypothyroidism type Cont on synthroid 100 mcg daily.  pt is stable for discharge-will need PT/OT/HHA per home health.  DME needed includes 3N1, standard WC. Rx written.  will need to follow up with PCP within 2 weeks.      Carlos American. Harle Battiest  [facility] [phone] 8 am - 5 pm) [phone] (after hours)

## 2015-01-27 ENCOUNTER — Telehealth: Payer: Self-pay | Admitting: Cardiovascular Disease

## 2015-01-27 NOTE — Telephone Encounter (Signed)
3 attempts made to schedule OD fu from recall.  LM with wife to call office for scheduling    Deleting Recall.

## 2015-02-03 DIAGNOSIS — I639 Cerebral infarction, unspecified: Secondary | ICD-10-CM

## 2015-02-03 HISTORY — DX: Cerebral infarction, unspecified: I63.9

## 2015-02-06 ENCOUNTER — Encounter: Payer: Self-pay | Admitting: Family Medicine

## 2015-02-06 ENCOUNTER — Telehealth: Payer: Self-pay

## 2015-02-06 ENCOUNTER — Ambulatory Visit (INDEPENDENT_AMBULATORY_CARE_PROVIDER_SITE_OTHER): Payer: Commercial Managed Care - HMO | Admitting: Family Medicine

## 2015-02-06 ENCOUNTER — Ambulatory Visit (INDEPENDENT_AMBULATORY_CARE_PROVIDER_SITE_OTHER)
Admission: RE | Admit: 2015-02-06 | Discharge: 2015-02-06 | Disposition: A | Discharge status: Home / Self Care | Payer: Commercial Managed Care - HMO | Source: Ambulatory Visit | Attending: Family Medicine | Admitting: Family Medicine

## 2015-02-06 VITALS — BP 122/78 | HR 87 | Temp 97.5°F | Wt 150.5 lb

## 2015-02-06 DIAGNOSIS — J189 Pneumonia, unspecified organism: Secondary | ICD-10-CM

## 2015-02-06 DIAGNOSIS — S72142S Displaced intertrochanteric fracture of left femur, sequela: Secondary | ICD-10-CM

## 2015-02-06 LAB — CBC WITH DIFFERENTIAL/PLATELET
BASOS ABS: 0.1 10*3/uL (ref 0.0–0.1)
Basophils Relative: 0.6 % (ref 0.0–3.0)
EOS PCT: 0.8 % (ref 0.0–5.0)
Eosinophils Absolute: 0.1 10*3/uL (ref 0.0–0.7)
HEMATOCRIT: 45.3 % (ref 39.0–52.0)
HEMOGLOBIN: 14.5 g/dL (ref 13.0–17.0)
LYMPHS ABS: 1.2 10*3/uL (ref 0.7–4.0)
LYMPHS PCT: 14.3 % (ref 12.0–46.0)
MCHC: 32 g/dL (ref 30.0–36.0)
MCV: 87.7 fl (ref 78.0–100.0)
MONOS PCT: 5.2 % (ref 3.0–12.0)
Monocytes Absolute: 0.4 10*3/uL (ref 0.1–1.0)
NEUTROS PCT: 79.1 % — AB (ref 43.0–77.0)
Neutro Abs: 6.7 10*3/uL (ref 1.4–7.7)
Platelets: 309 10*3/uL (ref 150.0–400.0)
RBC: 5.16 Mil/uL (ref 4.22–5.81)
RDW: 16.4 % — ABNORMAL HIGH (ref 11.5–15.5)
WBC: 8.4 10*3/uL (ref 4.0–10.5)

## 2015-02-06 NOTE — Telephone Encounter (Signed)
Mrs Sibilia left v/m; pt was seen earlier today and [name] wanted PT continued; Mrs Tolson said ins co. Wants to discontinue physical therapy; Mrs Ellenbecker wants to know if [name] could contact ins co and let them know pt needs more PT.Please advise.

## 2015-02-06 NOTE — Progress Notes (Signed)
Pre visit review using our clinic review tool, if applicable. No additional management support is needed unless otherwise documented below in the visit note.  Admitted with L hip fx and PNA.  S/p repair and inpatient rehab for fx along with abx for PNA.  Due for f/u labs and CXR.   Images with the fx reviewed with patient and spouse at the [facility].  Prev labs and cxr d/w pt at [facility], esp re: PNA.  Has had HHPT at home.  Wife is getting by caring for patient.  He can bear some weight, using a walker.  Here in W/c at [place].   Pain controlled, with less pain meds now.  Pain controlled.  No lower GI sx but still with occ vomiting episodes about every 2-3 weeks, last event last night.  No clear triggers and this appears to be his baseline.    PMH and SH reviewed  ROS: See HPI, otherwise noncontributory.  Meds, vitals, and allergies reviewed.   nad ncat Mmm Neck supple, no LA rrr Ctab, no W/R/R abd soft Ext w/o edema L hip with weakness on resisted flexion but able to bear weight with assistance.  Distally grossly NV intact but still with some expected weakness in gait on the L leg.

## 2015-02-06 NOTE — Patient Instructions (Addendum)
If PT doesn't keep coming out, then let me know.   Go to the lab on the way out.  We'll contact you with your lab and xray report. Take care. Glad to see you.

## 2015-02-08 ENCOUNTER — Encounter: Payer: Self-pay | Admitting: Family Medicine

## 2015-02-08 NOTE — Assessment & Plan Note (Addendum)
S/p repair, with less pain med requirement and less pain.  Getting by at home, would ask for HHPT to continue.  He isn't fully ambulatory but I don't know how much he'll be able to get back.  I would still be in favor of continued PT to try to max out his ability and strength.  He and wife agree.  Recheck labs today, recheck cxr today re: his PNA prev, see notes on both.  He'll keep f/u with ortho and update me as needed.  Okay for outpatient f/u at this point.  The episode of vomiting appears to be his baseline and it wouldn't benefit him for invasive w/u at this point.  He has seen GI prev.  >25 minutes spent in face to face time with patient, >50% spent in counselling or coordination of care.

## 2015-02-08 NOTE — Telephone Encounter (Signed)
Please give the order to continue PT- see if this is possible.  Still with some weakness in the L hip.  Thanks.

## 2015-02-09 ENCOUNTER — Emergency Department (HOSPITAL_COMMUNITY): Payer: Commercial Managed Care - HMO

## 2015-02-09 ENCOUNTER — Encounter (HOSPITAL_COMMUNITY): Payer: Self-pay | Admitting: Emergency Medicine

## 2015-02-09 ENCOUNTER — Inpatient Hospital Stay (HOSPITAL_COMMUNITY)
Admission: EM | Admit: 2015-02-09 | Discharge: 2015-02-12 | DRG: 065 | Disposition: A | Discharge status: Home Health | Payer: Commercial Managed Care - HMO | Attending: Internal Medicine | Admitting: Internal Medicine

## 2015-02-09 DIAGNOSIS — K219 Gastro-esophageal reflux disease without esophagitis: Secondary | ICD-10-CM | POA: Diagnosis present

## 2015-02-09 DIAGNOSIS — S72142S Displaced intertrochanteric fracture of left femur, sequela: Secondary | ICD-10-CM

## 2015-02-09 DIAGNOSIS — Z7982 Long term (current) use of aspirin: Secondary | ICD-10-CM

## 2015-02-09 DIAGNOSIS — N400 Enlarged prostate without lower urinary tract symptoms: Secondary | ICD-10-CM | POA: Diagnosis not present

## 2015-02-09 DIAGNOSIS — I63511 Cerebral infarction due to unspecified occlusion or stenosis of right middle cerebral artery: Secondary | ICD-10-CM | POA: Diagnosis not present

## 2015-02-09 DIAGNOSIS — F0280 Dementia in other diseases classified elsewhere without behavioral disturbance: Secondary | ICD-10-CM | POA: Diagnosis present

## 2015-02-09 DIAGNOSIS — R059 Cough, unspecified: Secondary | ICD-10-CM

## 2015-02-09 DIAGNOSIS — G451 Carotid artery syndrome (hemispheric): Secondary | ICD-10-CM

## 2015-02-09 DIAGNOSIS — Z96653 Presence of artificial knee joint, bilateral: Secondary | ICD-10-CM | POA: Diagnosis present

## 2015-02-09 DIAGNOSIS — G459 Transient cerebral ischemic attack, unspecified: Secondary | ICD-10-CM

## 2015-02-09 DIAGNOSIS — G8194 Hemiplegia, unspecified affecting left nondominant side: Secondary | ICD-10-CM | POA: Diagnosis present

## 2015-02-09 DIAGNOSIS — E039 Hypothyroidism, unspecified: Secondary | ICD-10-CM | POA: Diagnosis present

## 2015-02-09 DIAGNOSIS — R05 Cough: Secondary | ICD-10-CM

## 2015-02-09 DIAGNOSIS — I10 Essential (primary) hypertension: Secondary | ICD-10-CM | POA: Diagnosis present

## 2015-02-09 DIAGNOSIS — E7800 Pure hypercholesterolemia, unspecified: Secondary | ICD-10-CM | POA: Diagnosis present

## 2015-02-09 DIAGNOSIS — S72142A Displaced intertrochanteric fracture of left femur, initial encounter for closed fracture: Secondary | ICD-10-CM | POA: Diagnosis present

## 2015-02-09 DIAGNOSIS — G3183 Dementia with Lewy bodies: Secondary | ICD-10-CM | POA: Diagnosis present

## 2015-02-09 DIAGNOSIS — E785 Hyperlipidemia, unspecified: Secondary | ICD-10-CM | POA: Diagnosis present

## 2015-02-09 DIAGNOSIS — M6289 Other specified disorders of muscle: Secondary | ICD-10-CM | POA: Diagnosis not present

## 2015-02-09 DIAGNOSIS — R29810 Facial weakness: Secondary | ICD-10-CM | POA: Diagnosis present

## 2015-02-09 LAB — I-STAT CHEM 8, ED
BUN: 35 mg/dL — ABNORMAL HIGH (ref 6–20)
Calcium, Ion: 1.2 mmol/L (ref 1.13–1.30)
Chloride: 104 mmol/L (ref 101–111)
Creatinine, Ser: 1.2 mg/dL (ref 0.61–1.24)
Glucose, Bld: 100 mg/dL — ABNORMAL HIGH (ref 65–99)
HCT: 48 % (ref 39.0–52.0)
Hemoglobin: 16.3 g/dL (ref 13.0–17.0)
Potassium: 3.7 mmol/L (ref 3.5–5.1)
Sodium: 142 mmol/L (ref 135–145)
TCO2: 24 mmol/L (ref 0–100)

## 2015-02-09 LAB — CBC
HCT: 44.1 % (ref 39.0–52.0)
Hemoglobin: 14.3 g/dL (ref 13.0–17.0)
MCH: 28.6 pg (ref 26.0–34.0)
MCHC: 32.4 g/dL (ref 30.0–36.0)
MCV: 88.2 fL (ref 78.0–100.0)
Platelets: 235 10*3/uL (ref 150–400)
RBC: 5 MIL/uL (ref 4.22–5.81)
RDW: 14.5 % (ref 11.5–15.5)
WBC: 6.8 10*3/uL (ref 4.0–10.5)

## 2015-02-09 LAB — COMPREHENSIVE METABOLIC PANEL
ALT: 35 U/L (ref 17–63)
AST: 41 U/L (ref 15–41)
Albumin: 3.7 g/dL (ref 3.5–5.0)
Alkaline Phosphatase: 102 U/L (ref 38–126)
Anion gap: 9 (ref 5–15)
BUN: 31 mg/dL — ABNORMAL HIGH (ref 6–20)
CO2: 24 mmol/L (ref 22–32)
Calcium: 9.3 mg/dL (ref 8.9–10.3)
Chloride: 106 mmol/L (ref 101–111)
Creatinine, Ser: 1.26 mg/dL — ABNORMAL HIGH (ref 0.61–1.24)
GFR calc Af Amer: 60 mL/min — ABNORMAL LOW (ref >60)
GFR calc non Af Amer: 51 mL/min — ABNORMAL LOW (ref >60)
Glucose, Bld: 108 mg/dL — ABNORMAL HIGH (ref 65–99)
Potassium: 3.7 mmol/L (ref 3.5–5.1)
Sodium: 139 mmol/L (ref 135–145)
Total Bilirubin: 0.6 mg/dL (ref 0.3–1.2)
Total Protein: 7 g/dL (ref 6.5–8.1)

## 2015-02-09 LAB — RAPID URINE DRUG SCREEN, HOSP PERFORMED
Amphetamines: NOT DETECTED
Barbiturates: NOT DETECTED
Benzodiazepines: NOT DETECTED
Cocaine: NOT DETECTED
Opiates: POSITIVE — AB
Tetrahydrocannabinol: NOT DETECTED

## 2015-02-09 LAB — DIFFERENTIAL
Basophils Absolute: 0 10*3/uL (ref 0.0–0.1)
Basophils Relative: 0 %
Eosinophils Absolute: 0.1 10*3/uL (ref 0.0–0.7)
Eosinophils Relative: 2 %
Lymphocytes Relative: 19 %
Lymphs Abs: 1.3 10*3/uL (ref 0.7–4.0)
Monocytes Absolute: 0.9 10*3/uL (ref 0.1–1.0)
Monocytes Relative: 13 %
Neutro Abs: 4.5 10*3/uL (ref 1.7–7.7)
Neutrophils Relative %: 66 %

## 2015-02-09 LAB — URINE MICROSCOPIC-ADD ON

## 2015-02-09 LAB — URINALYSIS, ROUTINE W REFLEX MICROSCOPIC
Bilirubin Urine: NEGATIVE
Glucose, UA: NEGATIVE mg/dL
Hgb urine dipstick: NEGATIVE
Ketones, ur: NEGATIVE mg/dL
Nitrite: NEGATIVE
Protein, ur: NEGATIVE mg/dL
Specific Gravity, Urine: 1.028 (ref 1.005–1.030)
Urobilinogen, UA: 1 mg/dL (ref 0.0–1.0)
pH: 5.5 (ref 5.0–8.0)

## 2015-02-09 LAB — I-STAT TROPONIN, ED: Troponin i, poc: 0.01 ng/mL (ref 0.00–0.08)

## 2015-02-09 LAB — PROTIME-INR
INR: 1.05 (ref 0.00–1.49)
Prothrombin Time: 13.9 seconds (ref 11.6–15.2)

## 2015-02-09 LAB — APTT: aPTT: 35 seconds (ref 24–37)

## 2015-02-09 LAB — ETHANOL: Alcohol, Ethyl (B): <5 mg/dL (ref <5)

## 2015-02-09 MED ORDER — PANTOPRAZOLE SODIUM 40 MG PO TBEC: 80.0000 mg | DELAYED_RELEASE_TABLET | Freq: Every day | ORAL | Status: DC

## 2015-02-09 MED ORDER — ONDANSETRON HCL 4 MG PO TABS
4.0000 mg | ORAL_TABLET | Freq: Four times a day (QID) | ORAL | Status: DC | PRN
  Administered 2015-02-12: 4 mg via ORAL
  Filled 2015-02-09: qty 1

## 2015-02-09 MED ORDER — B COMPLEX-C PO TABS
1.0000 | ORAL_TABLET | Freq: Every day | ORAL | Status: DC
  Administered 2015-02-09 – 2015-02-12 (×3): 1 via ORAL
  Filled 2015-02-09 (×4): qty 1

## 2015-02-09 MED ORDER — DONEPEZIL HCL 10 MG PO TABS
10.0000 mg | ORAL_TABLET | Freq: Every day | ORAL | Status: DC
Start: 2015-02-10 — End: 2015-02-12
  Administered 2015-02-11 – 2015-02-12 (×2): 10 mg via ORAL
  Filled 2015-02-09 (×2): qty 1

## 2015-02-09 MED ORDER — TERAZOSIN HCL 5 MG PO CAPS
5.0000 mg | ORAL_CAPSULE | Freq: Every day | ORAL | Status: DC
  Administered 2015-02-09 – 2015-02-11 (×3): 5 mg via ORAL
  Filled 2015-02-09 (×3): qty 1

## 2015-02-09 MED ORDER — ADULT MULTIVITAMIN W/MINERALS CH
1.0000 | ORAL_TABLET | Freq: Every day | ORAL | Status: DC
  Administered 2015-02-11 – 2015-02-12 (×2): 1 via ORAL
  Filled 2015-02-09 (×2): qty 1

## 2015-02-09 MED ORDER — ENOXAPARIN SODIUM 40 MG/0.4ML ~~LOC~~ SOLN
40.0000 mg | SUBCUTANEOUS | Status: DC
  Administered 2015-02-09 – 2015-02-11 (×3): 40 mg via SUBCUTANEOUS
  Filled 2015-02-09 (×3): qty 0.4

## 2015-02-09 MED ORDER — ASPIRIN EC 81 MG PO TBEC
81.0000 mg | DELAYED_RELEASE_TABLET | Freq: Every day | ORAL | Status: DC
  Administered 2015-02-09: 81 mg via ORAL
  Filled 2015-02-09: qty 1

## 2015-02-09 MED ORDER — LEVOTHYROXINE SODIUM 100 MCG PO TABS
100.0000 ug | ORAL_TABLET | Freq: Every day | ORAL | Status: DC
Start: 2015-02-10 — End: 2015-02-12
  Administered 2015-02-10 – 2015-02-12 (×3): 100 ug via ORAL
  Filled 2015-02-09 (×3): qty 1

## 2015-02-09 MED ORDER — HYDRALAZINE HCL 20 MG/ML IJ SOLN: 10.0000 mg | INTRAMUSCULAR | Status: DC | PRN

## 2015-02-09 MED ORDER — RISAQUAD PO CAPS
1.0000 | ORAL_CAPSULE | Freq: Every day | ORAL | Status: DC
  Administered 2015-02-11 – 2015-02-12 (×2): 1 via ORAL
  Filled 2015-02-09 (×2): qty 1

## 2015-02-09 MED ORDER — POLYETHYLENE GLYCOL 3350 17 G PO PACK: 17.0000 g | PACK | Freq: Every day | ORAL | Status: DC | PRN

## 2015-02-09 MED ORDER — ASPIRIN EC 325 MG PO TBEC
325.0000 mg | DELAYED_RELEASE_TABLET | Freq: Every day | ORAL | Status: DC
  Administered 2015-02-09: 325 mg via ORAL

## 2015-02-09 MED ORDER — SODIUM CHLORIDE 0.9 % IV SOLN
INTRAVENOUS | Status: DC
  Administered 2015-02-09: 23:00:00 via INTRAVENOUS

## 2015-02-09 MED ORDER — PANTOPRAZOLE SODIUM 40 MG PO TBEC
80.0000 mg | DELAYED_RELEASE_TABLET | Freq: Every day | ORAL | Status: DC
  Administered 2015-02-11 – 2015-02-12 (×2): 80 mg via ORAL
  Filled 2015-02-09 (×3): qty 2

## 2015-02-09 MED ORDER — STROKE: EARLY STAGES OF RECOVERY BOOK
Freq: Once | Status: AC
  Administered 2015-02-09: 22:00:00

## 2015-02-09 MED ORDER — TERAZOSIN HCL 5 MG PO CAPS
5.0000 mg | ORAL_CAPSULE | Freq: Every day | ORAL | Status: DC
  Filled 2015-02-09: qty 1

## 2015-02-09 MED ORDER — HYDROCODONE-ACETAMINOPHEN 5-325 MG PO TABS
1.0000 | ORAL_TABLET | Freq: Four times a day (QID) | ORAL | Status: DC | PRN
  Administered 2015-02-10: 2 via ORAL
  Filled 2015-02-09: qty 2

## 2015-02-09 NOTE — Telephone Encounter (Signed)
Verbal order given to [facility] with [facility] care. Jalene Mullet stated that she plans to continue to see the patient for physical therapy.

## 2015-02-09 NOTE — Telephone Encounter (Signed)
Spoke to patient's wife and was advised that she has the information regarding the therapist that has been coming to the house and will call back with this information.Marland Kitchen

## 2015-02-09 NOTE — H&P (Addendum)
[facility] History and Physical  [name] QPY:[id number] DOB: Mar 29, 1931 DOA: 02/09/2015  Referring physician: ED  PCP: Elsie Stain, MD   Chief Complaint: Left-sided facial droop  HPI:  Patient is a 81 year old male left-hand dominant with a past medical history significant for TIA, HTN, dementia, hypothyroidism, left hip fracture on 9/22 and subsequent surgery on 9/23; who presents with complaints of left-sided facial droop and weakness. Patient's history is obtained by the patient's wife who is present at bedside.  Patient was last seen to be normal around noon and later this afternoon was seen to have some left-sided facial drooping by his son. There was some slurred speech and the patient was noted to be showing some signs of weakness in trying to transfer and walk. He had been receiving physical therapy at home after his previous hip fracture. At baseline patient was noted to have been able to transfer and able to walk some short distances with a rolling walker. He had been unable to use the bathroom or bathe without assistance. Associated symptoms included reports of numbness of the left side of his face .  On admission to the emergency department today the CT of the brain showed no acute abnormality. However MRI of the brain showed acute right MCA stroke.    Review of Systems  Constitutional: Negative for fever and chills.  Eyes: Negative for blurred vision and double vision.  Respiratory: Positive for cough and sputum production. Negative for stridor.   Cardiovascular: Negative for palpitations and leg swelling.  Gastrointestinal: Negative for nausea and vomiting.  Genitourinary: Positive for frequency. Negative for dysuria and urgency.  Musculoskeletal: Positive for joint pain.  Skin: Positive for itching. Negative for rash.  Neurological: Positive for sensory change, speech change, focal weakness and weakness. Negative for seizures.  Endo/Heme/Allergies: Negative  for polydipsia. Does not bruise/bleed easily.  Psychiatric/Behavioral: Positive for memory loss. Negative for suicidal ideas.        Past Medical History  Diagnosis Date  . Hypertension   . Hypothyroidism   . Diverticulosis of colon   . BPH (benign prostatic hypertrophy)   . Pancreatitis 2011    and gallstone pancreatitis and Ecoli bacteremia  . 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 followed by [name]- observation as of 09/2010  . Rabbit fever 1940  . History of shingles   . Dementia   . Pneumonia     2016     Past Surgical History  Procedure Laterality Date  . Appendectomy  1959  . Thyroidectomy, partial  1966  . Knee arthroscopy  06/1995    left Dr Derrel Nip  . Joint replacement  04/30/2001    bilateral TKR  . Cholecystectomy  10/2009  . Eye muscle surgery      12/12 had double vision Dr. Juleen China at [facility]  . Orif hip fracture Left 12/26/2014    Procedure: OPEN REDUCTION INTERNAL FIXATION HIP;  Surgeon: Paralee Cancel, MD;  Location: [facility] ORS;  Service: Orthopedics;  Laterality: Left;      Social History:  reports that he has never smoked. He has never used smokeless tobacco. He reports that he does not drink alcohol or use illicit drugs. where does patient live--home  and with whom if at home? Wife Can patient participate in ADLs? Needs assistance with bathing and using restroom  Allergies  Allergen Reactions  . Sulfonamide Derivatives Other (See Comments)    Childhood allergy  . Valtrex [Valacyclovir Hcl]  NAV    Family History  Problem Relation Age of Onset  . Hypertension Mother   . Stroke Mother   . Alcohol abuse Father   . Cancer Brother     prostate CA  . Heart disease Brother        Prior to Admission medications   Medication Sig Start Date End Date Taking? Authorizing Provider  aspirin EC 325 MG EC tablet Take 1 tablet (325 mg total) by mouth daily. 07/13/12  Yes Ripudeep Krystal Eaton, MD  B Complex-C (B-COMPLEX WITH VITAMIN C)  tablet Take 1 tablet by mouth daily. 07/13/12  Yes Ripudeep Krystal Eaton, MD  donepezil (ARICEPT) 10 MG tablet Take 1 tablet (10 mg total) by mouth daily. 12/11/14  Yes Tonia Ghent, MD  HYDROcodone-acetaminophen (NORCO/VICODIN) 5-325 MG per tablet Take 1-2 tablets by mouth every 6 (six) hours as needed for moderate pain. 12/28/14  Yes Amber Constable, PA-C  levothyroxine (SYNTHROID, LEVOTHROID) 100 MCG tablet TAKE 1 TABLET EVERY DAY Patient taking differently: TAKE 100 MCG BY MOUTH DAILY 09/26/14  Yes Tonia Ghent, MD  Multiple Vitamin (MULTIVITAMIN) tablet Take 1 tablet by mouth daily.     Yes Historical Provider, MD  omeprazole (PRILOSEC) 40 MG capsule Take 40 mg by mouth daily.  12/10/14  Yes Historical Provider, MD  ondansetron (ZOFRAN) 4 MG tablet Take 1 tablet (4 mg total) by mouth every 6 (six) hours as needed for nausea. 12/30/14  Yes Venetia Maxon Rama, MD  polyethylene glycol (MIRALAX / GLYCOLAX) packet Take 17 g by mouth daily. Patient taking differently: Take 17 g by mouth daily as needed.  12/30/14  Yes Venetia Maxon Rama, MD  Probiotic Product (PROBIOTIC PO) Take 1 tablet by mouth daily.   Yes Historical Provider, MD  terazosin (HYTRIN) 5 MG capsule Take 1 capsule (5 mg total) by mouth at bedtime. 08/11/14  Yes Tonia Ghent, MD  ferrous sulfate 325 (65 FE) MG tablet Take 1 tablet (325 mg total) by mouth 2 (two) times daily with a meal. Patient not taking: Reported on 02/09/2015 12/30/14   Venetia Maxon Rama, MD     Physical Exam: Filed Vitals:   02/09/15 1945 02/09/15 2003 02/09/15 2015 02/09/15 2030  BP:  174/101 167/96 157/94  Pulse:  70 70 69  Temp: 98 F (36.7 C)     TempSrc:      Resp:  15 13 15   Height:      Weight:      SpO2:  99% 97% 96%    Constitutional: Vital signs reviewed. Patient is a well-developed and well-nourished in no acute distress and cooperative with exam. Alert and oriented only to person and place Head: Normocephalic and atraumatic  Ear: TM normal bilaterally   Mouth: no erythema or exudates, MMM  Eyes: PERRL, EOMI, conjunctivae normal, No scleral icterus.  Neck: Supple, Trachea midline normal ROM, No JVD, mass, thyromegaly, or carotid bruit present.  Cardiovascular: RRR, S1 normal, S2 normal, no MRG, pulses symmetric and intact bilaterally  Pulmonary/Chest: CTAB, no wheezes, rales, or rhonchi  Abdominal: Soft. Non-tender, non-distended, bowel sounds are normal, no masses, organomegaly, or guarding present.  GU: no CVA tenderness Musculoskeletal: No joint deformities, erythema, or stiffness, Ext: no edema and no cyanosis, pulses palpable bilaterally (DP and PT)  Hematology: no cervical, inginal, or axillary adenopathy.  Neurological: A&O x2, left-sided facial droop still present. Strenght in left upper and lower extremity 4 out of 5. Right upper and lower extremity strength 5 out of 5.  otherwise cranial nerve II-XII are grossly intact, sensory intact to light touch bilaterally.  Skin: Warm, dry and intact. No rash, cyanosis, or clubbing.  Psychiatric: Normal mood and affect. Decreased recent memory.   Data Review   Micro Results No results found for this or any previous visit (from the past 240 hour(s)).  Radiology Reports Dg Chest 2 View  02/06/2015  CLINICAL DATA:  Community-acquired pneumonia EXAM: CHEST  2 VIEW COMPARISON:  12/26/2014 FINDINGS: COPD with pulmonary hyperinflation. Resolving left lower lobe pneumonia. Mild residual scar or atelectasis in the left lung base. No significant effusion. Right lung is clear. Bilateral nipple shadows. IMPRESSION: COPD with clearing of left lower lobe pneumonia. Electronically Signed   By: Franchot Gallo M.D.   On: 02/06/2015 16:03   Ct Head Wo Contrast  02/09/2015  CLINICAL DATA:  Code stroke, left facial droop, slurred speech EXAM: CT HEAD WITHOUT CONTRAST TECHNIQUE: Contiguous axial images were obtained from the base of the skull through the vertex without contrast. COMPARISON:  11/09/2014 FINDINGS:  Advanced brain atrophy and chronic white matter microvascular ischemic change throughout the cerebral hemispheres. There is associated ventricular enlargement. No acute intracranial hemorrhage, definite infarction, midline shift, herniation, or extra-axial fluid collection. No focal mass effect or edema. Cisterns are patent. Cerebellar atrophy as well. Orbits are symmetric. Chronic maxillary sinus disease. Other sinuses and mastoids remain clear. Intact skull. IMPRESSION: Advanced atrophy, chronic small vessel ischemic change, and ventricular enlargement. No interval change or acute finding by noncontrast CT These results were called by telephone at the time of interpretation on 02/09/2015 at 5:53 pm to Dr. Aram Beecham, who verbally acknowledged these results. Electronically Signed   By: Jerilynn Mages.  Shick M.D.   On: 02/09/2015 17:54   Mr Jodene Nam Head Wo Contrast  02/09/2015  CLINICAL DATA:  Patient with dementia developed LEFT facial droop, LEFT face numbness and dysarthria earlier today. EXAM: MRI HEAD WITHOUT CONTRAST MRA HEAD WITHOUT CONTRAST TECHNIQUE: Multiplanar, multiecho pulse sequences of the brain and surrounding structures were obtained without intravenous contrast. Angiographic images of the head were obtained using MRA technique without contrast. COMPARISON:  CT head earlier today. MR head 12/29/2012. MRI and MRA 07/11/2012. FINDINGS: MRI HEAD FINDINGS There is a curvilinear area of restricted diffusion, affecting the superior most insula RIGHT as well as the subcortical and periventricular white matter on the RIGHT, just posterior to the lentiform nucleus, consistent with an acute lenticulostriate infarct. No other areas of restricted diffusion are seen. No acute hemorrhage, intra-axial mass lesion, or extra-axial fluid. There is global atrophy with hydrocephalus ex vacuo. Significant prominence of the cortical sulci and extracerebral CSF spaces and cisterns. Moderately advanced T2 and FLAIR hyperintensities  throughout the periventricular and subcortical white matter representing probable chronic microvascular ischemic change. Small foci of chronic hemorrhage are seen in the deep nuclei, particularly the thalamus, and to a lesser degree the cortex and subcortical white matter of the cerebral hemispheres consistent with hypertensive cerebral vascular disease. Flow voids are maintained. Partial empty sella. No tonsillar herniation. Cervical spondylosis. BILATERAL cataract extraction. Chronic BILATERAL maxillary sinus disease but no sinus or mastoid air fluid level. Compared with 2014, similar appearance except for the acute infarct. MRA HEAD FINDINGS Widely patent and dolichoectatic internal carotid arteries and basilar artery. LEFT vertebral dominant with RIGHT vertebral supplying primarily the PICA. No MCA stenosis affecting the proximal M1 segments. Moderately diseased distal MCA branches, without definite M3 or beyond occlusion. Moderately diseased RIGHT A1 ACA. This is non worrisome given the robust LEFT A1  ACA. Fetal origin RIGHT PCA. LEFT PCA displays a moderate stenosis of the P2 segment. No intracranial aneurysm. No cerebellar branch occlusion is evident. Similar appearance to prior MRA. IMPRESSION: Acute RIGHT MCA lenticulostriate territory infarct affecting the superior most insula as well as the subcortical and periventricular white matter of the RIGHT hemisphere. This is nonhemorrhagic. Advanced atrophy and small vessel disease. Numerous chronic microbleeds representing sequelae of hypertensive cerebrovascular disease. No proximal vascular occlusion or stenosis of significance. Electronically Signed   By: Staci Righter M.D.   On: 02/09/2015 20:13   Mr Brain Wo Contrast  02/09/2015  CLINICAL DATA:  Patient with dementia developed LEFT facial droop, LEFT face numbness and dysarthria earlier today. EXAM: MRI HEAD WITHOUT CONTRAST MRA HEAD WITHOUT CONTRAST TECHNIQUE: Multiplanar, multiecho pulse sequences of  the brain and surrounding structures were obtained without intravenous contrast. Angiographic images of the head were obtained using MRA technique without contrast. COMPARISON:  CT head earlier today. MR head 12/29/2012. MRI and MRA 07/11/2012. FINDINGS: MRI HEAD FINDINGS There is a curvilinear area of restricted diffusion, affecting the superior most insula RIGHT as well as the subcortical and periventricular white matter on the RIGHT, just posterior to the lentiform nucleus, consistent with an acute lenticulostriate infarct. No other areas of restricted diffusion are seen. No acute hemorrhage, intra-axial mass lesion, or extra-axial fluid. There is global atrophy with hydrocephalus ex vacuo. Significant prominence of the cortical sulci and extracerebral CSF spaces and cisterns. Moderately advanced T2 and FLAIR hyperintensities throughout the periventricular and subcortical white matter representing probable chronic microvascular ischemic change. Small foci of chronic hemorrhage are seen in the deep nuclei, particularly the thalamus, and to a lesser degree the cortex and subcortical white matter of the cerebral hemispheres consistent with hypertensive cerebral vascular disease. Flow voids are maintained. Partial empty sella. No tonsillar herniation. Cervical spondylosis. BILATERAL cataract extraction. Chronic BILATERAL maxillary sinus disease but no sinus or mastoid air fluid level. Compared with 2014, similar appearance except for the acute infarct. MRA HEAD FINDINGS Widely patent and dolichoectatic internal carotid arteries and basilar artery. LEFT vertebral dominant with RIGHT vertebral supplying primarily the PICA. No MCA stenosis affecting the proximal M1 segments. Moderately diseased distal MCA branches, without definite M3 or beyond occlusion. Moderately diseased RIGHT A1 ACA. This is non worrisome given the robust LEFT A1 ACA. Fetal origin RIGHT PCA. LEFT PCA displays a moderate stenosis of the P2 segment.  No intracranial aneurysm. No cerebellar branch occlusion is evident. Similar appearance to prior MRA. IMPRESSION: Acute RIGHT MCA lenticulostriate territory infarct affecting the superior most insula as well as the subcortical and periventricular white matter of the RIGHT hemisphere. This is nonhemorrhagic. Advanced atrophy and small vessel disease. Numerous chronic microbleeds representing sequelae of hypertensive cerebrovascular disease. No proximal vascular occlusion or stenosis of significance. Electronically Signed   By: Staci Righter M.D.   On: 02/09/2015 20:13     CBC  Recent Labs Lab 02/06/15 1226 02/09/15 1750 02/09/15 1755  WBC 8.4  --  6.8  HGB 14.5 16.3 14.3  HCT 45.3 48.0 44.1  PLT 309.0  --  235  MCV 87.7  --  88.2  MCH  --   --  28.6  MCHC 32.0  --  32.4  RDW 16.4*  --  14.5  LYMPHSABS 1.2  --  1.3  MONOABS 0.4  --  0.9  EOSABS 0.1  --  0.1  BASOSABS 0.1  --  0.0    Chemistries   Recent Labs Lab 02/09/15  1750 02/09/15 1755  NA 142 139  K 3.7 3.7  CL 104 106  CO2  --  24  GLUCOSE 100* 108*  BUN 35* 31*  CREATININE 1.20 1.26*  CALCIUM  --  9.3  AST  --  41  ALT  --  35  ALKPHOS  --  102  BILITOT  --  0.6   ------------------------------------------------------------------------------------------------------------------ estimated creatinine clearance is 44.7 mL/min (by C-G formula based on Cr of 1.26). ------------------------------------------------------------------------------------------------------------------ No results for input(s): HGBA1C in the last 72 hours. ------------------------------------------------------------------------------------------------------------------ No results for input(s): CHOL, HDL, LDLCALC, TRIG, CHOLHDL, LDLDIRECT in the last 72 hours. ------------------------------------------------------------------------------------------------------------------ No results for input(s): TSH, T4TOTAL, T3FREE, THYROIDAB in the last 72  hours.  Invalid input(s): FREET3 ------------------------------------------------------------------------------------------------------------------ No results for input(s): VITAMINB12, FOLATE, FERRITIN, TIBC, IRON, RETICCTPCT in the last 72 hours.  Coagulation profile  Recent Labs Lab 02/09/15 1755  INR 1.05    No results for input(s): DDIMER in the last 72 hours.  Cardiac Enzymes No results for input(s): CKMB, TROPONINI, MYOGLOBIN in the last 168 hours.  Invalid input(s): CK ------------------------------------------------------------------------------------------------------------------ Invalid input(s): POCBNP   CBG: No results for input(s): GLUCAP in the last 168 hours.     EKG: Independently reviewed. Normal sinus rhythm Assessment/Plan Principal Problem:  Acute right MCA stroke [facility]): New problem. Patient is left-hand dominant with new onset of left-sided facial droop with left-sided weakness most notably in the left upper extremity found on MRI to be positive for a acute right MCA stroke. Neurology consult to Dr. Aram Beecham completed stroke workup in progress -checking Echocardiogram, Carotid dopplers, lipid panel,  neuro checks q 2hrs -  PT/OT/SLP - telemetry  Hyperlipidemia. Patient's last lipid panel was performed approximately 1-1/2 years ago was seen to be within normal limits with a total cholesterol 141 HDL 47.9 LDL 67.  -follow-up Lipid panel    Hypothyroidism: Chronic. Last TSH shows approximately 1-1/2 years ago and noted to be low at 0.19 - Check a TSH - Continue levothyroxine per home dose of 100 mcg daily    Acute left-sided weakness. Secondary to above. -PT/OT eval  Slurred speech. Secondary to above -Speech eval  - Bedside swallow eval prior to allowing patient to eat -Normal saline IV fluids at 75 mL per hour    Essential hypertension.  -Allowing for permissive hypertension  -prn hydralazine for systolic blood pressures greater than 409 and  diastolic blood pressures greater than 120  -Restart Terazosin tomorrow  BPH (benign prostatic hyperplasia. Patient's creatinine appears to be around his baseline of 1.2.  -We will bladder scan q 8 hours. -Place Foley if urinary retention greater than 300 mL of   Dementia. Stable -Continue Aricept  Code Status:   full Family Communication: bedside Disposition Plan: admit   Total time spent 55 minutes.Greater than 50% of this time was spent in counseling, explanation of diagnosis, planning of further management, and coordination of care  [facility] Pager [phone]  If 7PM-7AM, please contact night-coverage [email or web address] Password [email or web address] 02/09/2015, 9:08 PM

## 2015-02-09 NOTE — Telephone Encounter (Signed)
Wife called back, the therapist is Anne Shutter with [facility] . The phone number is [phone] Thank you

## 2015-02-09 NOTE — Consult Note (Signed)
Referring Physician: ED    Chief Complaint: code stroke, left face droop, left face numbness, dysarthria,   HPI:                                                                                                                                         [name] is an 81 y.o. male with a past medical history significant for HTN, TIA, dementia, lefft hip surgery 9/16, and hypothyroidism, brought in by EMS due to acute onset of the above stated symptoms. Wife is at the bedside. Patient was home by himself, last known well around noon time, slept the whole afternoon, and when his son came back from work he was taken to the bathroom in his wheelchair when his son noticed that the left side of the face was droopier than the right and he was having some mild slurred speech. Further, he reportedly had left face numbness. Patient denies HA, vertigo, double vision, focal weakness, language or vision impairment. NIHSS 1 for mild left face weakness. CT brain was personally reviewed and showed no acute abnormality.  Date last known well:  Time last known well:  tPA Given: no, late presentation, major surgery few weeks ago, minimal deficits. NIHSS: 1  MRS: 3  Past Medical History  Diagnosis Date  . Hypertension   . Hypothyroidism   . Diverticulosis of colon   . BPH (benign prostatic hypertrophy)   . Pancreatitis 2011    and gallstone pancreatitis and Ecoli bacteremia  . 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 followed by [name]- observation as of 09/2010  . Rabbit fever 1940  . History of shingles   . Dementia   . Pneumonia     2016    Past Surgical History  Procedure Laterality Date  . Appendectomy  1959  . Thyroidectomy, partial  1966  . Knee arthroscopy  06/1995    left Dr Derrel Nip  . Joint replacement  04/30/2001    bilateral TKR  . Cholecystectomy  10/2009  . Eye muscle surgery      12/12 had double vision Dr. Juleen China [facility]  . Orif hip fracture Left 12/26/2014     Procedure: OPEN REDUCTION INTERNAL FIXATION HIP;  Surgeon: Paralee Cancel, MD;  Location: [facility] ORS;  Service: Orthopedics;  Laterality: Left;    Family History  Problem Relation Age of Onset  . Hypertension Mother   . Stroke Mother   . Alcohol abuse Father   . Cancer Brother     prostate CA  . Heart disease Brother    Social History:  reports that he has never smoked. He has never used smokeless tobacco. He reports that he does not drink alcohol or use illicit drugs. Family history: no epilepsy, MS, or brain tumor. Allergies:  Allergies  Allergen Reactions  . Sulfonamide Derivatives Other (See Comments)  Childhood allergy  . Valtrex [Valacyclovir Hcl]     NAV    Medications:                                                                                                                           I have reviewed the patient's current medications.  ROS:                                                                                                                                       History obtained from wife, chart review and the patient  General ROS: negative for - chills, fatigue, fever, night sweats, weight gain or weight loss Psychological ROS: negative for - behavioral disorder, hallucinations, or suicidal ideation Ophthalmic ROS: negative for - blurry vision, double vision, eye pain or loss of vision ENT ROS: negative for - epistaxis, nasal discharge, oral lesions, sore throat, tinnitus or vertigo Allergy and Immunology ROS: negative for - hives or itchy/watery eyes Hematological and Lymphatic ROS: negative for - bleeding problems, bruising or swollen lymph nodes Endocrine ROS: negative for - galactorrhea, hair pattern changes, polydipsia/polyuria or temperature intolerance Respiratory ROS: negative for - cough, hemoptysis, shortness of breath or wheezing Cardiovascular ROS: negative for - chest pain, dyspnea on exertion, edema or irregular heartbeat Gastrointestinal  ROS: negative for - abdominal pain, diarrhea, hematemesis, nausea/vomiting or stool incontinence Genito-Urinary ROS: negative for - dysuria, hematuria, incontinence or urinary frequency/urgency Musculoskeletal ROS: negative for - joint swelling or muscular weakness Neurological ROS: as noted in HPI Dermatological ROS: negative for rash and skin lesion changes  Physical exam:  Constitutional: well developed, pleasant male in no apparent distress. Blood pressure 173/106, pulse 74, temperature 98 F (36.7 C), temperature source Oral, resp. rate 16, height 5\' 10"  (1.778 m), weight 69.854 kg (154 lb), SpO2 96 %. Eyes: no jaundice or exophthalmos.  Head: normocephalic. Neck: supple, no bruits, no JVD. Cardiac: no murmurs. Lungs: clear. Abdomen: soft, no tender, no mass. Extremities: no edema, clubbing, or cyanosis.  Skin: no rash  Neurologic Examination:  General: NAD Mental Status: Alert, oriented, thought content appropriate.  Speech fluent without evidence of aphasia.  Able to follow 3 step commands without difficulty. Cranial Nerves: II: Discs flat bilaterally; Visual fields grossly normal, pupils equal, round, reactive to light and accommodation III,IV, VI: ptosis not present, extra-ocular motions intact bilaterally V,VII: smile mildly asymmetric due to left face weakness, facial light touch sensation normal bilaterally VIII: hearing normal bilaterally IX,X: uvula rises symmetrically XI: bilateral shoulder shrug XII: midline tongue extension without atrophy or fasciculations  Motor: Can not move the left LE due to recent hip surgery, but otherwise no focal weakness noted. Tone and bulk:normal tone throughout; no atrophy noted Sensory: Pinprick and light touch intact throughout, bilaterally Deep Tendon Reflexes:  1 all over Plantars: Right: downgoing   Left:  downgoing Cerebellar: normal finger-to-nose,  normal heel-to-shin test on the right but can not be performed in the left due to hip surgery Gait:  No tested due to recent left hip surgery and multiple leads    Results for orders placed or performed during the hospital encounter of 02/09/15 (from the past 48 hour(s))  I-Stat Chem 8, ED  (not at [facility], [facility])     Status: Abnormal   Collection Time: 02/09/15  5:50 PM  Result Value Ref Range   Sodium 142 135 - 145 mmol/L   Potassium 3.7 3.5 - 5.1 mmol/L   Chloride 104 101 - 111 mmol/L   BUN 35 (H) 6 - 20 mg/dL   Creatinine, Ser 1.20 0.61 - 1.24 mg/dL   Glucose, Bld 100 (H) 65 - 99 mg/dL   Calcium, Ion 1.20 1.13 - 1.30 mmol/L   TCO2 24 0 - 100 mmol/L   Hemoglobin 16.3 13.0 - 17.0 g/dL   HCT 48.0 39.0 - 52.0 %   Ct Head Wo Contrast  02/09/2015  CLINICAL DATA:  Code stroke, left facial droop, slurred speech EXAM: CT HEAD WITHOUT CONTRAST TECHNIQUE: Contiguous axial images were obtained from the base of the skull through the vertex without contrast. COMPARISON:  11/09/2014 FINDINGS: Advanced brain atrophy and chronic white matter microvascular ischemic change throughout the cerebral hemispheres. There is associated ventricular enlargement. No acute intracranial hemorrhage, definite infarction, midline shift, herniation, or extra-axial fluid collection. No focal mass effect or edema. Cisterns are patent. Cerebellar atrophy as well. Orbits are symmetric. Chronic maxillary sinus disease. Other sinuses and mastoids remain clear. Intact skull. IMPRESSION: Advanced atrophy, chronic small vessel ischemic change, and ventricular enlargement. No interval change or acute finding by noncontrast CT These results were called by telephone at the time of interpretation on 02/09/2015 at 5:53 pm to Dr. Aram Beecham, who verbally acknowledged these results. Electronically Signed   By: Jerilynn Mages.  Shick M.D.   On: 02/09/2015 17:54    Assessment: 81 y.o. male with acute onset left  face weakness, dysarthria and left face numbness (resolved). NIHSS 1 for mild left face weakness.  CT brain without acute abnormality. Patient did not receive iv tpa due to late presentation, recent major surgery, and minimal deficit. Possible right brain TIA versus small subcortical  Infarct. Admit to medicine .TIA/stroke work up.  Stroke team will follow up tomorrow.  Stroke Risk Factors - age,  HTN, TIA  Plan: 1. HgbA1c, fasting lipid panel 2. MRI, MRA  of the brain without contrast 3. Echocardiogram 4. Carotid dopplers 5. Prophylactic therapy-aspirin after passing swallowing evaluation 6. Risk factor modification 7. Telemetry monitoring 8. Frequent neuro checks 9. PT/OT SLP  Dorian Pod, MD [facility] [phone]  02/09/2015,  5:59 PM

## 2015-02-09 NOTE — ED Notes (Signed)
Pt was last seen normal at 1200 by family at home. When wife seen pt again around 1700 he has left sided facial droop and he seemed to be talking out of the right side of his mouth. Pt is alert and oriented to person. Equal grip states, no weakness noticed in extremities.

## 2015-02-10 ENCOUNTER — Inpatient Hospital Stay (HOSPITAL_COMMUNITY): Payer: Commercial Managed Care - HMO

## 2015-02-10 DIAGNOSIS — G459 Transient cerebral ischemic attack, unspecified: Secondary | ICD-10-CM

## 2015-02-10 DIAGNOSIS — M6289 Other specified disorders of muscle: Secondary | ICD-10-CM

## 2015-02-10 DIAGNOSIS — R29810 Facial weakness: Secondary | ICD-10-CM

## 2015-02-10 DIAGNOSIS — I63511 Cerebral infarction due to unspecified occlusion or stenosis of right middle cerebral artery: Principal | ICD-10-CM

## 2015-02-10 LAB — LIPID PANEL
CHOL/HDL RATIO: 2.5 ratio
CHOLESTEROL: 108 mg/dL (ref 0–200)
HDL: 43 mg/dL (ref >40)
LDL Cholesterol: 55 mg/dL (ref 0–99)
Triglycerides: 50 mg/dL (ref <150)
VLDL: 10 mg/dL (ref 0–40)

## 2015-02-10 LAB — TSH: TSH: 0.809 u[IU]/mL (ref 0.350–4.500)

## 2015-02-10 MED ORDER — SODIUM CHLORIDE 0.9 % IV SOLN: INTRAVENOUS | Status: AC

## 2015-02-10 MED ORDER — CLOPIDOGREL BISULFATE 75 MG PO TABS
75.0000 mg | ORAL_TABLET | Freq: Every day | ORAL | Status: DC
  Administered 2015-02-11 – 2015-02-12 (×2): 75 mg via ORAL
  Filled 2015-02-10 (×2): qty 1

## 2015-02-10 NOTE — Progress Notes (Addendum)
Patient Demographics:    [name], is a 81 y.o. male, DOB - 11-06-1930, [id number]  Admit date - 02/09/2015   Admitting Physician Norval Morton, MD  Outpatient Primary MD for the patient is Elsie Stain, MD  LOS - 1   Chief Complaint  Patient presents with  . Code Stroke        Subjective:    [name] today has, No headache, No chest pain, No abdominal pain - No Nausea, No new weakness tingling or numbness, No Cough - SOB. He is weak all over   Assessment  & Plan :    1. Acute right MCA stroke. Confirmed on MRI, full stroke workup underway, currently on aspirin for secondary prevention, LDL appears to be less than 70. A1c pending. Echo carotid pending. Will be seen by PT-OT and speech. He most likely will require SNF placement due to underlying weakness and recent right hip fracture.   2. Hypothyroidism. Continue home dose Synthroid TSH is stable.   3. Essential hypertension. Allow for permissive hypertension due to stroke, as needed when necessary hydralazine with written parameters.   4. Recent mechanical fall at home with right femoral neck fracture. Months ago. Continues to be weak, initiate PT. Ouk require placement.   5. BPH. Continue alpha blocker.   6. Early dementia. Continue donepezil. At risk for delirium. Minimize narcotics and benzodiazepine.   7. GERD. On PPI continue   8. Borderline UA. Monitor urine cultures and signs of any acute infection.    Code Status : Full  Family Communication  : Wife bedside  Disposition Plan  : Likely SNF  Consults  :  Neurology  Procedures  :   Echogram, carotid duplex both pending.   CT head nonacute, MRI brain showing right MCA stroke.  DVT Prophylaxis  :  Lovenox    Lab Results  Component Value Date   PLT  235 02/09/2015    Inpatient Medications  Scheduled Meds: . acidophilus  1 capsule Oral Daily  . aspirin EC  325 mg Oral Daily  . B-complex with vitamin C  1 tablet Oral Daily  . donepezil  10 mg Oral Daily  . enoxaparin (LOVENOX) injection  40 mg Subcutaneous Q24H  . levothyroxine  100 mcg Oral QAC breakfast  . multivitamin with minerals  1 tablet Oral Daily  . pantoprazole  80 mg Oral Daily  . terazosin  5 mg Oral QHS   Continuous Infusions: . sodium chloride     PRN Meds:.hydrALAZINE, HYDROcodone-acetaminophen, ondansetron, polyethylene glycol  Antibiotics  :    Anti-infectives    None        Objective:   Filed Vitals:   02/10/15 0300 02/10/15 0500 02/10/15 0655 02/10/15 0827  BP: 141/78 121/81 128/84 134/97  Pulse: 92 90 98 79  Temp: 98.7 F (37.1 C) 98.6 F (37 C) 98.6 F (37 C) 98.4 F (36.9 C)  TempSrc: Oral Oral Oral Oral  Resp: 18 18 18 18   Height:      Weight:      SpO2: 100% 98% 98% 95%    Wt Readings from Last 3 Encounters:  02/09/15 70.1 kg (154 lb 8.7 oz)  02/06/15 68.266 kg (150 lb 8 oz)  12/25/14 68.04 kg (  150 lb)    No intake or output data in the 24 hours ending 02/10/15 1058   Physical Exam  Awake Alert, Oriented X 3, No new F.N deficits, continues to have some left-sided facial droop, Normal affect [place].AT,PERRAL Supple Neck,No JVD, No cervical lymphadenopathy appriciated.  Symmetrical Chest wall movement, Good air movement bilaterally, CTAB RRR,No Gallops,Rubs or new Murmurs, No Parasternal Heave +ve B.Sounds, Abd Soft, No tenderness, No organomegaly appriciated, No rebound - guarding or rigidity. No Cyanosis, Clubbing or edema, No new Rash or bruise     Data Review:   Micro Results No results found for this or any previous visit (from the past 240 hour(s)).  Radiology Reports Dg Chest 2 View  02/10/2015  CLINICAL DATA:  Cough and weakness today EXAM: CHEST - 2 VIEW COMPARISON:  02/06/2015 FINDINGS: Cardiac shadow is at the  upper limits of normal in size accentuated by the portable technique. Mild left basilar atelectasis is seen. No effusion is noted. No other focal infiltrative density is noted. Bony structures are stable. IMPRESSION: Mild left basilar atelectasis. Electronically Signed   By: Inez Catalina M.D.   On: 02/10/2015 10:03   Dg Chest 2 View  02/06/2015  CLINICAL DATA:  Community-acquired pneumonia EXAM: CHEST  2 VIEW COMPARISON:  12/26/2014 FINDINGS: COPD with pulmonary hyperinflation. Resolving left lower lobe pneumonia. Mild residual scar or atelectasis in the left lung base. No significant effusion. Right lung is clear. Bilateral nipple shadows. IMPRESSION: COPD with clearing of left lower lobe pneumonia. Electronically Signed   By: Franchot Gallo M.D.   On: 02/06/2015 16:03   Ct Head Wo Contrast  02/09/2015  CLINICAL DATA:  Code stroke, left facial droop, slurred speech EXAM: CT HEAD WITHOUT CONTRAST TECHNIQUE: Contiguous axial images were obtained from the base of the skull through the vertex without contrast. COMPARISON:  11/09/2014 FINDINGS: Advanced brain atrophy and chronic white matter microvascular ischemic change throughout the cerebral hemispheres. There is associated ventricular enlargement. No acute intracranial hemorrhage, definite infarction, midline shift, herniation, or extra-axial fluid collection. No focal mass effect or edema. Cisterns are patent. Cerebellar atrophy as well. Orbits are symmetric. Chronic maxillary sinus disease. Other sinuses and mastoids remain clear. Intact skull. IMPRESSION: Advanced atrophy, chronic small vessel ischemic change, and ventricular enlargement. No interval change or acute finding by noncontrast CT These results were called by telephone at the time of interpretation on 02/09/2015 at 5:53 pm to Dr. Aram Beecham, who verbally acknowledged these results. Electronically Signed   By: Jerilynn Mages.  Shick M.D.   On: 02/09/2015 17:54   Mr Jodene Nam Head Wo Contrast  02/09/2015  CLINICAL  DATA:  Patient with dementia developed LEFT facial droop, LEFT face numbness and dysarthria earlier today. EXAM: MRI HEAD WITHOUT CONTRAST MRA HEAD WITHOUT CONTRAST TECHNIQUE: Multiplanar, multiecho pulse sequences of the brain and surrounding structures were obtained without intravenous contrast. Angiographic images of the head were obtained using MRA technique without contrast. COMPARISON:  CT head earlier today. MR head 12/29/2012. MRI and MRA 07/11/2012. FINDINGS: MRI HEAD FINDINGS There is a curvilinear area of restricted diffusion, affecting the superior most insula RIGHT as well as the subcortical and periventricular white matter on the RIGHT, just posterior to the lentiform nucleus, consistent with an acute lenticulostriate infarct. No other areas of restricted diffusion are seen. No acute hemorrhage, intra-axial mass lesion, or extra-axial fluid. There is global atrophy with hydrocephalus ex vacuo. Significant prominence of the cortical sulci and extracerebral CSF spaces and cisterns. Moderately advanced T2 and FLAIR hyperintensities throughout  the periventricular and subcortical white matter representing probable chronic microvascular ischemic change. Small foci of chronic hemorrhage are seen in the deep nuclei, particularly the thalamus, and to a lesser degree the cortex and subcortical white matter of the cerebral hemispheres consistent with hypertensive cerebral vascular disease. Flow voids are maintained. Partial empty sella. No tonsillar herniation. Cervical spondylosis. BILATERAL cataract extraction. Chronic BILATERAL maxillary sinus disease but no sinus or mastoid air fluid level. Compared with 2014, similar appearance except for the acute infarct. MRA HEAD FINDINGS Widely patent and dolichoectatic internal carotid arteries and basilar artery. LEFT vertebral dominant with RIGHT vertebral supplying primarily the PICA. No MCA stenosis affecting the proximal M1 segments. Moderately diseased distal MCA  branches, without definite M3 or beyond occlusion. Moderately diseased RIGHT A1 ACA. This is non worrisome given the robust LEFT A1 ACA. Fetal origin RIGHT PCA. LEFT PCA displays a moderate stenosis of the P2 segment. No intracranial aneurysm. No cerebellar branch occlusion is evident. Similar appearance to prior MRA. IMPRESSION: Acute RIGHT MCA lenticulostriate territory infarct affecting the superior most insula as well as the subcortical and periventricular white matter of the RIGHT hemisphere. This is nonhemorrhagic. Advanced atrophy and small vessel disease. Numerous chronic microbleeds representing sequelae of hypertensive cerebrovascular disease. No proximal vascular occlusion or stenosis of significance. Electronically Signed   By: Staci Righter M.D.   On: 02/09/2015 20:13   Mr Brain Wo Contrast  02/09/2015  CLINICAL DATA:  Patient with dementia developed LEFT facial droop, LEFT face numbness and dysarthria earlier today. EXAM: MRI HEAD WITHOUT CONTRAST MRA HEAD WITHOUT CONTRAST TECHNIQUE: Multiplanar, multiecho pulse sequences of the brain and surrounding structures were obtained without intravenous contrast. Angiographic images of the head were obtained using MRA technique without contrast. COMPARISON:  CT head earlier today. MR head 12/29/2012. MRI and MRA 07/11/2012. FINDINGS: MRI HEAD FINDINGS There is a curvilinear area of restricted diffusion, affecting the superior most insula RIGHT as well as the subcortical and periventricular white matter on the RIGHT, just posterior to the lentiform nucleus, consistent with an acute lenticulostriate infarct. No other areas of restricted diffusion are seen. No acute hemorrhage, intra-axial mass lesion, or extra-axial fluid. There is global atrophy with hydrocephalus ex vacuo. Significant prominence of the cortical sulci and extracerebral CSF spaces and cisterns. Moderately advanced T2 and FLAIR hyperintensities throughout the periventricular and subcortical  white matter representing probable chronic microvascular ischemic change. Small foci of chronic hemorrhage are seen in the deep nuclei, particularly the thalamus, and to a lesser degree the cortex and subcortical white matter of the cerebral hemispheres consistent with hypertensive cerebral vascular disease. Flow voids are maintained. Partial empty sella. No tonsillar herniation. Cervical spondylosis. BILATERAL cataract extraction. Chronic BILATERAL maxillary sinus disease but no sinus or mastoid air fluid level. Compared with 2014, similar appearance except for the acute infarct. MRA HEAD FINDINGS Widely patent and dolichoectatic internal carotid arteries and basilar artery. LEFT vertebral dominant with RIGHT vertebral supplying primarily the PICA. No MCA stenosis affecting the proximal M1 segments. Moderately diseased distal MCA branches, without definite M3 or beyond occlusion. Moderately diseased RIGHT A1 ACA. This is non worrisome given the robust LEFT A1 ACA. Fetal origin RIGHT PCA. LEFT PCA displays a moderate stenosis of the P2 segment. No intracranial aneurysm. No cerebellar branch occlusion is evident. Similar appearance to prior MRA. IMPRESSION: Acute RIGHT MCA lenticulostriate territory infarct affecting the superior most insula as well as the subcortical and periventricular white matter of the RIGHT hemisphere. This is nonhemorrhagic. Advanced atrophy and  small vessel disease. Numerous chronic microbleeds representing sequelae of hypertensive cerebrovascular disease. No proximal vascular occlusion or stenosis of significance. Electronically Signed   By: Staci Righter M.D.   On: 02/09/2015 20:13     CBC  Recent Labs Lab 02/06/15 1226 02/09/15 1750 02/09/15 1755  WBC 8.4  --  6.8  HGB 14.5 16.3 14.3  HCT 45.3 48.0 44.1  PLT 309.0  --  235  MCV 87.7  --  88.2  MCH  --   --  28.6  MCHC 32.0  --  32.4  RDW 16.4*  --  14.5  LYMPHSABS 1.2  --  1.3  MONOABS 0.4  --  0.9  EOSABS 0.1  --  0.1    BASOSABS 0.1  --  0.0    Chemistries   Recent Labs Lab 02/09/15 1750 02/09/15 1755  NA 142 139  K 3.7 3.7  CL 104 106  CO2  --  24  GLUCOSE 100* 108*  BUN 35* 31*  CREATININE 1.20 1.26*  CALCIUM  --  9.3  AST  --  41  ALT  --  35  ALKPHOS  --  102  BILITOT  --  0.6   ------------------------------------------------------------------------------------------------------------------ estimated creatinine clearance is 44.8 mL/min (by C-G formula based on Cr of 1.26). ------------------------------------------------------------------------------------------------------------------ No results for input(s): HGBA1C in the last 72 hours. ------------------------------------------------------------------------------------------------------------------  Recent Labs  02/10/15 0455  CHOL 108  HDL 43  LDLCALC 55  TRIG 50  CHOLHDL 2.5   ------------------------------------------------------------------------------------------------------------------  Recent Labs  02/10/15 0455  TSH 0.809   ------------------------------------------------------------------------------------------------------------------ No results for input(s): VITAMINB12, FOLATE, FERRITIN, TIBC, IRON, RETICCTPCT in the last 72 hours.  Coagulation profile  Recent Labs Lab 02/09/15 1755  INR 1.05    No results for input(s): DDIMER in the last 72 hours.  Cardiac Enzymes No results for input(s): CKMB, TROPONINI, MYOGLOBIN in the last 168 hours.  Invalid input(s): CK ------------------------------------------------------------------------------------------------------------------ Invalid input(s): POCBNP   Time Spent in minutes  35   Joseangel Nettleton K M.D on 02/10/2015 at 10:58 AM  Between 7am to 7pm - Pager - [phone]  After 7pm go to [email or web address] - password [facility]  [facility] -  Office  [phone]

## 2015-02-10 NOTE — Progress Notes (Signed)
VASCULAR LAB PRELIMINARY  PRELIMINARY  PRELIMINARY  PRELIMINARY  Carotid duplex completed.    Preliminary report:  Bilateral:  1-39% ICA stenosis.  Vertebral artery flow is antegrade.     Salihah Peckham, RVS 02/10/2015, 1:31 PM

## 2015-02-10 NOTE — Care Management Note (Signed)
Case Management Note  Patient Details  Name: [name] MRN: [id number] Date of Birth: [birth year]  Subjective/Objective:                    Action/Plan: Patient admitted with CVA. Patient having frequent falls at home. He lives with his wife at home. Awaiting PT/OT recommendations for discharge disposition. CM will continue to follow for discharge needs.   Expected Discharge Date:  02/12/15               Expected Discharge Plan:  [place]  In-House Referral:     Discharge planning Services     Post Acute Care Choice:    Choice offered to:     DME Arranged:    DME Agency:     HH Arranged:    HH Agency:     Status of Service:  In process, will continue to follow  Medicare Important Message Given:    Date Medicare IM Given:    Medicare IM give by:    Date Additional Medicare IM Given:    Additional Medicare Important Message give by:     If discussed at [place] of Stay Meetings, dates discussed:    Additional Comments:  Pollie Friar, RN 02/10/2015, 10:08 AM

## 2015-02-10 NOTE — Progress Notes (Signed)
Inpatient Rehabilitation  Patient was screened by Charme[facility] [place] for appropriateness for an Inpatient Acute Rehab consult.  At this time, we are recommending Inpatient Rehab consult.  Please order when you feel appropriate.   Navya Timmons PT Inpatient Rehab Admissions Coordinator Cell [phone] Office [phone]   

## 2015-02-10 NOTE — Evaluation (Signed)
Physical Therapy Evaluation Patient Details Name: [name] MRN: [id number] DOB: 05-Jul-1930 Today's Date: 02/10/2015   History of Present Illness  pt is an 81 y/o male with dementia and recent h/o L hip fx and ORIF, admitted with L facial droop, slurred speech and L side weakness.  MRI shows R sided MCA CVA.  Clinical Impression  Pt admitted with/for left facial droop and L weakness.  Pt currently limited functionally due to the problems listed. ( See problems list.)   Pt will benefit from PT to maximize function and safety in order to get ready for next venue listed below.     Follow Up Recommendations CIR;Supervision/Assistance - 24 hour    Equipment Recommendations  None recommended by PT    Recommendations for Other Services Rehab consult     Precautions / Restrictions Precautions Precautions: Fall      Mobility  Bed Mobility Overal bed mobility: Needs Assistance Bed Mobility: Rolling;Sidelying to Sit Rolling: Min assist Sidelying to sit: Min assist       General bed mobility comments: pt needed cues for technique, truncal assist to come up and to help with momentum to EOB  Transfers Overall transfer level: Needs assistance   Transfers: Sit to/from Stand;Stand Pivot Transfers Sit to Stand: Mod assist Stand pivot transfers: Mod assist       General transfer comment: assist for stability due to moderated list to Left  Ambulation/Gait             General Gait Details: pt deferred  Stairs            Wheelchair Mobility    Modified Rankin (Stroke Patients Only) Modified Rankin (Stroke Patients Only) Pre-Morbid Rankin Score: No significant disability Modified Rankin: Moderately severe disability     Balance Overall balance assessment: Needs assistance Sitting-balance support: Single extremity supported Sitting balance-Leahy Scale: Poor Sitting balance - Comments: lists left with difficulty getting upright to midline and drifts posteriorly  with physical or verbal cues.   Standing balance support: Bilateral upper extremity supported Standing balance-Leahy Scale: Poor Standing balance comment: list to L, difficult to attain midline.                             Pertinent Vitals/Pain Pain Assessment: No/denies pain    Home Living Family/patient expects to be discharged to:: Unsure Living Arrangements: Spouse/significant other (son) Available Help at Discharge: Family;Available 24 hours/day Type of Home: House Home Access: Ramped entrance     Home Layout: One level Home Equipment: Walker - 2 wheels;Bedside commode;Shower seat;Grab bars - toilet;Grab bars - tub/shower;Wheelchair - manual      Prior Function Level of Independence: Needs assistance   Gait / Transfers Assistance Needed: assisted with RW or transfer to the chair           Hand Dominance   Dominant Hand: Left    Extremity/Trunk Assessment   Upper Extremity Assessment: Defer to OT evaluation (moving L UE with some incoordination)           Lower Extremity Assessment: RLE deficits/detail;LLE deficits/detail RLE Deficits / Details: WFL LLE Deficits / Details: uncoordinated and grossly weak at 3+ to 4-/5     Communication   Communication: HOH  Cognition Arousal/Alertness: Awake/alert Behavior During Therapy: Agitated;WFL for tasks assessed/performed;Impulsive (appears disgusted with the situation) Overall Cognitive Status: History of cognitive impairments - at baseline  General Comments      Exercises        Assessment/Plan    PT Assessment Patient needs continued PT services  PT Diagnosis Difficulty walking;Generalized weakness;Other (comment) (hemiparesis)   PT Problem List Decreased strength;Decreased activity tolerance;Decreased balance;Decreased mobility;Decreased coordination;Decreased knowledge of use of DME;Decreased safety awareness  PT Treatment Interventions DME instruction;Gait  training;Functional mobility training;Therapeutic activities;Balance training;Therapeutic exercise;Neuromuscular re-education;Patient/family education   PT Goals (Current goals can be found in the Care Plan section) Acute Rehab PT Goals Patient Stated Goal: home PT Goal Formulation: With patient Time For Goal Achievement: 02/24/15 Potential to Achieve Goals: Good    Frequency Min 3X/week   Barriers to discharge        Co-evaluation               End of Session   Activity Tolerance: Patient limited by fatigue Patient left: in chair;with call bell/phone within reach;with family/visitor present Nurse Communication: Mobility status         Time: 1125-1200 PT Time Calculation (min) (ACUTE ONLY): 35 min   Charges:   PT Evaluation $Initial PT Evaluation Tier I: 1 Procedure PT Treatments $Therapeutic Activity: 8-22 mins   PT G Codes:        Sabriya Yono, Tessie Fass 02/10/2015, 12:35 PM  02/10/2015  Donnella Sham, PT [phone]  (pager)

## 2015-02-10 NOTE — Progress Notes (Signed)
STROKE TEAM PROGRESS NOTE   HISTORY [name] is an 81 y.o. male with a past medical history significant for HTN, TIA, dementia, lefft hip surgery 9/16, and hypothyroidism, brought in by EMS due to acute onset of the above stated symptoms. Wife is at the bedside. Patient was home by himself, last known well around noon time, slept the whole afternoon, and when his son came back from work he was taken to the bathroom in his wheelchair when his son noticed that the left side of the face was droopier than the right and he was having some mild slurred speech. Further, he reportedly had left face numbness. Patient denies HA, vertigo, double vision, focal weakness, language or vision impairment. NIHSS 1 for mild left face weakness. CT brain was personally reviewed and showed no acute abnormality.  Patient was not administered TPA secondary to late presentation, major surgery few weeks ago, minimal deficits. He was admitted for further evaluation and treatment.  SUBJECTIVE (INTERVAL HISTORY) No family is at the bedside. He still disorientated but following simple commands. Still has right facial droop and mild right LE weakness.    OBJECTIVE Temp:  [98 F (36.7 C)-98.7 F (37.1 C)] 98.4 F (36.9 C) (11/08 0827) Pulse Rate:  [65-98] 79 (11/08 0827) Cardiac Rhythm:  [-] Normal sinus rhythm (11/08 0702) Resp:  [13-18] 18 (11/08 0827) BP: (121-174)/(78-106) 134/97 mmHg (11/08 0827) SpO2:  [95 %-100 %] 95 % (11/08 0827) Weight:  [69.854 kg (154 lb)-70.1 kg (154 lb 8.7 oz)] 70.1 kg (154 lb 8.7 oz) (11/07 2100)  CBC:  Recent Labs Lab 02/06/15 1226 02/09/15 1750 02/09/15 1755  WBC 8.4  --  6.8  NEUTROABS 6.7  --  4.5  HGB 14.5 16.3 14.3  HCT 45.3 48.0 44.1  MCV 87.7  --  88.2  PLT 309.0  --  371    Basic Metabolic Panel:  Recent Labs Lab 02/09/15 1750 02/09/15 1755  NA 142 139  K 3.7 3.7  CL 104 106  CO2  --  24  GLUCOSE 100* 108*  BUN 35* 31*  CREATININE 1.20 1.26*   CALCIUM  --  9.3    Lipid Panel:    Component Value Date/Time   CHOL 108 02/10/2015 0455   TRIG 50 02/10/2015 0455   TRIG 85 04/17/2006 1006   HDL 43 02/10/2015 0455   CHOLHDL 2.5 02/10/2015 0455   CHOLHDL 2.7 CALC 04/17/2006 1006   VLDL 10 02/10/2015 0455   LDLCALC 55 02/10/2015 0455   HgbA1c:  Lab Results  Component Value Date   HGBA1C 5.5 07/12/2012   Urine Drug Screen:    Component Value Date/Time   LABOPIA POSITIVE* 02/09/2015 1804   COCAINSCRNUR NONE DETECTED 02/09/2015 1804   LABBENZ NONE DETECTED 02/09/2015 1804   AMPHETMU NONE DETECTED 02/09/2015 1804   THCU NONE DETECTED 02/09/2015 1804   LABBARB NONE DETECTED 02/09/2015 1804      IMAGING I have personally reviewed the radiological images below and agree with the radiology interpretations.  Ct Head Wo Contrast 02/09/2015   Advanced atrophy, chronic small vessel ischemic change, and ventricular enlargement.   Mri & Mra Brain Wo Contrast 02/09/2015  Acute RIGHT MCA lenticulostriate territory infarct affecting the superior most insula as well as the subcortical and periventricular white matter of the RIGHT hemisphere. This is nonhemorrhagic. Advanced atrophy and small vessel disease. Numerous chronic microbleeds representing sequelae of hypertensive cerebrovascular disease. No proximal vascular occlusion or stenosis of significance.   TTE - - Left  ventricle: The cavity size was normal. There was moderate focal basal and mild concentric hypertrophy. Systolic function was vigorous. The estimated ejection fraction was in the range of 65% to 70%. Wall motion was normal; there were no regional wall motion abnormalities. Doppler parameters are consistent with abnormal left ventricular relaxation (grade 1 diastolic dysfunction). - Aortic valve: There was trivial regurgitation. - Aorta: Aortic root dimension: 38 mm (ED). - Ascending aorta: The ascending aorta was mildly dilated. Impressions: - Compared to  the prior study, there has been no significant interval change.  CUS - Bilateral: 1-39% ICA stenosis. Vertebral artery flow is antegrade.    PHYSICAL EXAM  Temp:  [98 F (36.7 C)-98.7 F (37.1 C)] 98.4 F (36.9 C) (11/08 0827) Pulse Rate:  [65-98] 79 (11/08 0827) Resp:  [13-18] 18 (11/08 0827) BP: (121-174)/(78-106) 134/97 mmHg (11/08 0827) SpO2:  [95 %-100 %] 95 % (11/08 0827) Weight:  [154 lb (69.854 kg)-154 lb 8.7 oz (70.1 kg)] 154 lb 8.7 oz (70.1 kg) (11/07 2100)  General - Well nourished, well developed, mild agitation.  Ophthalmologic - Fundi not visualized due to noncooperation.  Cardiovascular - Regular rate and rhythm.  Mental Status -  Awake alert, not orientated to place time or person. Follows simple commands, able to name and repeat, normal expression but mild dysarthria.  Cranial Nerves II - XII - II - Visual field intact OU. III, IV, VI - Extraocular movements intact. V - Facial sensation intact bilaterally. VII - left facial droop. VIII - Hearing & vestibular intact bilaterally. X - Palate elevates symmetrically, mild dysarthria. XI - Chin turning & shoulder shrug intact bilaterally. XII - Tongue protrusion intact.  Motor Strength - The patient's strength was normal in all extremities except LLE 4/5 likely due to left hip surgery in 12/2014 and pronator drift was absent.  Bulk was normal and fasciculations were absent.   Motor Tone - Muscle tone was assessed at the neck and appendages and was normal.  Reflexes - The patient's reflexes were 1+ in all extremities and he had no pathological reflexes.  Sensory - Light touch, temperature/pinprick were assessed and were symmetrical.    Coordination - The patient had dysmetria on the left hand FTN, intact coordination on the right hand.  Tremor was absent.  Gait and Station - not tested.   ASSESSMENT/PLAN Mr. [name] is a 81 y.o. male with history of TIA, HTN, dementia, hypothyroidism, left hip  fracture on 9/22 and subsequent surgery on 9/23 presenting with left facial droop, left side weakness. He did not receive IV t-PA due to late presentation, major surgery few weeks ago, minimal deficits.   Stroke:  R MCA lenticulostriate vs. Long insular artery infarct likely secondary to small vessel disease source  Resultant  Left facial droop and left hand incoordination  MRI  R MCA lenticulostriate infarct. chronic microbleeds  MRA  No significant large vessel stenosis  Carotid Doppler  unremarkable  2D Echo  EF 65-70%  LDL 55  HgbA1c pending  Lovenox 40 mg sq daily for VTE prophylaxis  Diet Heart Room service appropriate?: Yes; Fluid consistency:: Thin  aspirin 325 mg daily prior to admission, now on aspirin 325 mg daily. Recommend to switch to plavix for stroke prevention.  Recommend 30 day cardiac event monitoring as outpt to rule out afib as stroke location suspicious for M2 territory with lack of significant risk factors.  Patient counseled to be compliant with his antithrombotic medications  Ongoing aggressive stroke risk factor management  Therapy recommendations:  CIR  Disposition:  pending  Essential Hypertension  Stable Permissive hypertension (OK if < 220/120) but gradually normalize in 5-7 days  Other Stroke Risk Factors  Advanced age  Hx TIA  Family hx stroke (mother)  Other Active Problems  Baseline dementia, on aricept  Hypothyroidism  [facility] day # 1  Neurology will sign off. Please call with questions. Pt will follow up with Dr. Erlinda Hong at [facility] in about 2 months. Thanks for the consult.  Rosalin Hawking, MD PhD Stroke Neurology 02/10/2015 4:49 PM   To contact Stroke Continuity provider, please refer to [email or web address] After hours, contact General Neurology

## 2015-02-10 NOTE — Consult Note (Signed)
Physical Medicine and Rehabilitation Consult Reason for Consult: Right MCA infarct Referring Physician: [facility]   HPI: [name] is a 81 y.o. right handed male with history of hypertension , dementia maintained on Aricept, left hip fracture 12/25/2014, bilateral knee replacement 2003. Patient lives with spouse and son with good family support in [place]. One level home with ramped entrance. Patient used a rolling walker prior to admission. Presented 02/09/2015 with left-sided weakness and facial droop as well as slurred speech. MRI showed acute right MCA lenticulostriate territory infarct. MRA of the head with no proximal vascular occlusion or stenosis. Echocardiogram with ejection fraction of 32% grade 1 diastolic dysfunction. Venous Doppler studies with no ICA stenosis. Neurology consulted placed on aspirin for CVA prophylaxis as well as subcutaneous Lovenox for DVT prophylaxis. Patient did not receive TPA. Regular diet consistency. Physical therapy evaluation 02/10/2015 with recommendations of physical medicine rehabilitation consult.   Review of Systems  Constitutional: Negative for fever and chills.  HENT: Positive for hearing loss.   Eyes: Positive for blurred vision. Negative for double vision.  Respiratory: Negative for cough and shortness of breath.   Cardiovascular: Positive for leg swelling. Negative for chest pain and palpitations.  Gastrointestinal: Positive for constipation. Negative for nausea and vomiting.  Genitourinary: Negative for dysuria.  Musculoskeletal: Positive for myalgias and joint pain.  Skin: Negative for rash.  Neurological: Positive for speech change and weakness. Negative for seizures, loss of consciousness and headaches.  Psychiatric/Behavioral: Positive for memory loss.   Past Medical History  Diagnosis Date  . Hypertension   . Hypothyroidism   . Diverticulosis of colon   . BPH (benign prostatic hypertrophy)   . Pancreatitis  2011    and gallstone pancreatitis and Ecoli bacteremia  . 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 followed by [name]- observation as of 09/2010  . Rabbit fever 1940  . History of shingles   . Dementia   . Pneumonia     2016   Past Surgical History  Procedure Laterality Date  . Appendectomy  1959  . Thyroidectomy, partial  1966  . Knee arthroscopy  06/1995    left Dr Derrel Nip  . Joint replacement  04/30/2001    bilateral TKR  . Cholecystectomy  10/2009  . Eye muscle surgery      12/12 had double vision Dr. Juleen China at [facility]  . Orif hip fracture Left 12/26/2014    Procedure: OPEN REDUCTION INTERNAL FIXATION HIP;  Surgeon: Paralee Cancel, MD;  Location: [facility];  Service: Orthopedics;  Laterality: Left;   Family History  Problem Relation Age of Onset  . Hypertension Mother   . Stroke Mother   . Alcohol abuse Father   . Cancer Brother     prostate CA  . Heart disease Brother    Social History:  reports that he has never smoked. He has never used smokeless tobacco. He reports that he does not drink alcohol or use illicit drugs. Allergies:  Allergies  Allergen Reactions  . Sulfonamide Derivatives Other (See Comments)    Childhood allergy  . Valtrex [Valacyclovir Hcl]     NAV   Medications Prior to Admission  Medication Sig Dispense Refill  . aspirin EC 325 MG EC tablet Take 1 tablet (325 mg total) by mouth daily. 30 tablet 3  . B Complex-C (B-COMPLEX WITH VITAMIN C) tablet Take 1 tablet by mouth daily. 30 tablet 3  . donepezil (ARICEPT) 10  MG tablet Take 1 tablet (10 mg total) by mouth daily.    Marland Kitchen HYDROcodone-acetaminophen (NORCO/VICODIN) 5-325 MG per tablet Take 1-2 tablets by mouth every 6 (six) hours as needed for moderate pain. 60 tablet 0  . levothyroxine (SYNTHROID, LEVOTHROID) 100 MCG tablet TAKE 1 TABLET EVERY DAY (Patient taking differently: TAKE 100 MCG BY MOUTH DAILY) 90 tablet 1  . Multiple Vitamin (MULTIVITAMIN) tablet Take 1 tablet by mouth daily.       Marland Kitchen omeprazole (PRILOSEC) 40 MG capsule Take 40 mg by mouth daily.     . ondansetron (ZOFRAN) 4 MG tablet Take 1 tablet (4 mg total) by mouth every 6 (six) hours as needed for nausea. 20 tablet 0  . polyethylene glycol (MIRALAX / GLYCOLAX) packet Take 17 g by mouth daily. (Patient taking differently: Take 17 g by mouth daily as needed. ) 14 each 0  . Probiotic Product (PROBIOTIC PO) Take 1 tablet by mouth daily.    Marland Kitchen terazosin (HYTRIN) 5 MG capsule Take 1 capsule (5 mg total) by mouth at bedtime. 90 capsule 3  . ferrous sulfate 325 (65 FE) MG tablet Take 1 tablet (325 mg total) by mouth 2 (two) times daily with a meal. (Patient not taking: Reported on 02/09/2015)  3    Home: Home Living Family/patient expects to be discharged to:: Unsure Living Arrangements: Spouse/significant other (son) Available Help at Discharge: Family, Available 24 hours/day Type of Home: House Home Access: Ramped entrance Home Layout: One level Bathroom Shower/Tub: Multimedia programmer: Standard Bathroom Accessibility: Yes Home Equipment: Environmental consultant - 2 wheels, Bedside commode, Shower seat, Grab bars - toilet, Grab bars - tub/shower, Wheelchair - manual  Functional History: Prior Function Level of Independence: Needs assistance Gait / Transfers Assistance Needed: assisted with RW or transfer to the chair Functional Status:  Mobility: Bed Mobility Overal bed mobility: Needs Assistance Bed Mobility: Rolling, Sidelying to Sit Rolling: Min assist Sidelying to sit: Min assist General bed mobility comments: pt needed cues for technique, truncal assist to come up and to help with momentum to EOB Transfers Overall transfer level: Needs assistance Transfers: Sit to/from Stand, Stand Pivot Transfers Sit to Stand: Mod assist Stand pivot transfers: Mod assist General transfer comment: assist for stability due to moderated list to Left Ambulation/Gait General Gait Details: pt deferred    ADL:     Cognition: Cognition Overall Cognitive Status: History of cognitive impairments - at baseline Orientation Level: Oriented to person, Oriented to situation, Disoriented to place, Disoriented to time Cognition Arousal/Alertness: Awake/alert Behavior During Therapy: Agitated, WFL for tasks assessed/performed, Impulsive (appears disgusted with the situation) Overall Cognitive Status: History of cognitive impairments - at baseline  Blood pressure 134/97, pulse 79, temperature 98.4 F (36.9 C), temperature source Oral, resp. rate 18, height 5\' 10"  (1.778 m), weight 70.1 kg (154 lb 8.7 oz), SpO2 95 %. Physical Exam  Constitutional:  81 year old right-handed elderly male  HENT:  Mild left facial droop  Eyes: EOM are normal.  Neck: Normal range of motion. Neck supple. No thyromegaly present.  Cardiovascular: Normal rate and regular rhythm.   Respiratory: Effort normal and breath sounds normal. No respiratory distress.  GI: Soft. Bowel sounds are normal. He exhibits no distension.  Neurological: He is alert.  Pleasantly confused. HOH. Poor medical historian. He was able to tell me they lived in [place]. He was not able to provide his age or date of birth. Followed simple commands. RIght motor function and sensory function normal. Mild left sided  weakness but inconsistent. Left hemisensory loss to LT and pain is partial. Left central 7, speech slightly slurred.   Skin: Skin is warm and dry.  Psychiatric:  Confused but appropriate    Results for orders placed or performed during the hospital encounter of 02/09/15 (from the past 24 hour(s))  I-stat troponin, ED (not at [facility], [facility])     Status: None   Collection Time: 02/09/15  5:48 PM  Result Value Ref Range   Troponin i, poc 0.01 0.00 - 0.08 ng/mL   Comment 3          I-Stat Chem 8, ED  (not at [facility], [facility])     Status: Abnormal   Collection Time: 02/09/15  5:50 PM  Result Value Ref Range   Sodium 142 135 - 145 mmol/L    Potassium 3.7 3.5 - 5.1 mmol/L   Chloride 104 101 - 111 mmol/L   BUN 35 (H) 6 - 20 mg/dL   Creatinine, Ser 1.20 0.61 - 1.24 mg/dL   Glucose, Bld 100 (H) 65 - 99 mg/dL   Calcium, Ion 1.20 1.13 - 1.30 mmol/L   TCO2 24 0 - 100 mmol/L   Hemoglobin 16.3 13.0 - 17.0 g/dL   HCT 48.0 39.0 - 52.0 %  Protime-INR     Status: None   Collection Time: 02/09/15  5:55 PM  Result Value Ref Range   Prothrombin Time 13.9 11.6 - 15.2 seconds   INR 1.05 0.00 - 1.49  APTT     Status: None   Collection Time: 02/09/15  5:55 PM  Result Value Ref Range   aPTT 35 24 - 37 seconds  CBC     Status: None   Collection Time: 02/09/15  5:55 PM  Result Value Ref Range   WBC 6.8 4.0 - 10.5 K/uL   RBC 5.00 4.22 - 5.81 MIL/uL   Hemoglobin 14.3 13.0 - 17.0 g/dL   HCT 44.1 39.0 - 52.0 %   MCV 88.2 78.0 - 100.0 fL   MCH 28.6 26.0 - 34.0 pg   MCHC 32.4 30.0 - 36.0 g/dL   RDW 14.5 11.5 - 15.5 %   Platelets 235 150 - 400 K/uL  Differential     Status: None   Collection Time: 02/09/15  5:55 PM  Result Value Ref Range   Neutrophils Relative % 66 %   Neutro Abs 4.5 1.7 - 7.7 K/uL   Lymphocytes Relative 19 %   Lymphs Abs 1.3 0.7 - 4.0 K/uL   Monocytes Relative 13 %   Monocytes Absolute 0.9 0.1 - 1.0 K/uL   Eosinophils Relative 2 %   Eosinophils Absolute 0.1 0.0 - 0.7 K/uL   Basophils Relative 0 %   Basophils Absolute 0.0 0.0 - 0.1 K/uL  Comprehensive metabolic panel     Status: Abnormal   Collection Time: 02/09/15  5:55 PM  Result Value Ref Range   Sodium 139 135 - 145 mmol/L   Potassium 3.7 3.5 - 5.1 mmol/L   Chloride 106 101 - 111 mmol/L   CO2 24 22 - 32 mmol/L   Glucose, Bld 108 (H) 65 - 99 mg/dL   BUN 31 (H) 6 - 20 mg/dL   Creatinine, Ser 1.26 (H) 0.61 - 1.24 mg/dL   Calcium 9.3 8.9 - 10.3 mg/dL   Total Protein 7.0 6.5 - 8.1 g/dL   Albumin 3.7 3.5 - 5.0 g/dL   AST 41 15 - 41 U/L   ALT 35 17 - 63 U/L   Alkaline  Phosphatase 102 38 - 126 U/L   Total Bilirubin 0.6 0.3 - 1.2 mg/dL   GFR calc non Af Amer  51 (L) >60 mL/min   GFR calc Af Amer 60 (L) >60 mL/min   Anion gap 9 5 - 15  Ethanol     Status: None   Collection Time: 02/09/15  5:58 PM  Result Value Ref Range   Alcohol, Ethyl (B) <5 <5 mg/dL  Urine rapid drug screen (hosp performed)not at [facility]     Status: Abnormal   Collection Time: 02/09/15  6:04 PM  Result Value Ref Range   Opiates POSITIVE (A) NONE DETECTED   Cocaine NONE DETECTED NONE DETECTED   Benzodiazepines NONE DETECTED NONE DETECTED   Amphetamines NONE DETECTED NONE DETECTED   Tetrahydrocannabinol NONE DETECTED NONE DETECTED   Barbiturates NONE DETECTED NONE DETECTED  Urinalysis, Routine w reflex microscopic (not at [facility])     Status: Abnormal   Collection Time: 02/09/15  6:23 PM  Result Value Ref Range   Color, Urine YELLOW YELLOW   APPearance HAZY (A) CLEAR   Specific Gravity, Urine 1.028 1.005 - 1.030   pH 5.5 5.0 - 8.0   Glucose, UA NEGATIVE NEGATIVE mg/dL   Hgb urine dipstick NEGATIVE NEGATIVE   Bilirubin Urine NEGATIVE NEGATIVE   Ketones, ur NEGATIVE NEGATIVE mg/dL   Protein, ur NEGATIVE NEGATIVE mg/dL   Urobilinogen, UA 1.0 0.0 - 1.0 mg/dL   Nitrite NEGATIVE NEGATIVE   Leukocytes, UA SMALL (A) NEGATIVE  Urine microscopic-add on     Status: Abnormal   Collection Time: 02/09/15  6:23 PM  Result Value Ref Range   Squamous Epithelial / LPF FEW (A) RARE   WBC, UA 3-6 <3 WBC/hpf   RBC / HPF 0-2 <3 RBC/hpf   Bacteria, UA FEW (A) RARE   Crystals CA OXALATE CRYSTALS (A) NEGATIVE   Urine-Other MUCOUS PRESENT   Lipid panel     Status: None   Collection Time: 02/10/15  4:55 AM  Result Value Ref Range   Cholesterol 108 0 - 200 mg/dL   Triglycerides 50 <150 mg/dL   HDL 43 >40 mg/dL   Total CHOL/HDL Ratio 2.5 RATIO   VLDL 10 0 - 40 mg/dL   LDL Cholesterol 55 0 - 99 mg/dL  TSH     Status: None   Collection Time: 02/10/15  4:55 AM  Result Value Ref Range   TSH 0.809 0.350 - 4.500 uIU/mL   Dg Chest 2 View  02/10/2015  CLINICAL DATA:  Cough and weakness today  EXAM: CHEST - 2 VIEW COMPARISON:  02/06/2015 FINDINGS: Cardiac shadow is at the upper limits of normal in size accentuated by the portable technique. Mild left basilar atelectasis is seen. No effusion is noted. No other focal infiltrative density is noted. Bony structures are stable. IMPRESSION: Mild left basilar atelectasis. Electronically Signed   By: Inez Catalina M.D.   On: 02/10/2015 10:03   Ct Head Wo Contrast  02/09/2015  CLINICAL DATA:  Code stroke, left facial droop, slurred speech EXAM: CT HEAD WITHOUT CONTRAST TECHNIQUE: Contiguous axial images were obtained from the base of the skull through the vertex without contrast. COMPARISON:  11/09/2014 FINDINGS: Advanced brain atrophy and chronic white matter microvascular ischemic change throughout the cerebral hemispheres. There is associated ventricular enlargement. No acute intracranial hemorrhage, definite infarction, midline shift, herniation, or extra-axial fluid collection. No focal mass effect or edema. Cisterns are patent. Cerebellar atrophy as well. Orbits are symmetric. Chronic maxillary sinus disease. Other sinuses and  mastoids remain clear. Intact skull. IMPRESSION: Advanced atrophy, chronic small vessel ischemic change, and ventricular enlargement. No interval change or acute finding by noncontrast CT These results were called by telephone at the time of interpretation on 02/09/2015 at 5:53 pm to Dr. Aram Beecham, who verbally acknowledged these results. Electronically Signed   By: Jerilynn Mages.  Shick M.D.   On: 02/09/2015 17:54   Mr Jodene Nam Head Wo Contrast  02/09/2015  CLINICAL DATA:  Patient with dementia developed LEFT facial droop, LEFT face numbness and dysarthria earlier today. EXAM: MRI HEAD WITHOUT CONTRAST MRA HEAD WITHOUT CONTRAST TECHNIQUE: Multiplanar, multiecho pulse sequences of the brain and surrounding structures were obtained without intravenous contrast. Angiographic images of the head were obtained using MRA technique without contrast.  COMPARISON:  CT head earlier today. MR head 12/29/2012. MRI and MRA 07/11/2012. FINDINGS: MRI HEAD FINDINGS There is a curvilinear area of restricted diffusion, affecting the superior most insula RIGHT as well as the subcortical and periventricular white matter on the RIGHT, just posterior to the lentiform nucleus, consistent with an acute lenticulostriate infarct. No other areas of restricted diffusion are seen. No acute hemorrhage, intra-axial mass lesion, or extra-axial fluid. There is global atrophy with hydrocephalus ex vacuo. Significant prominence of the cortical sulci and extracerebral CSF spaces and cisterns. Moderately advanced T2 and FLAIR hyperintensities throughout the periventricular and subcortical white matter representing probable chronic microvascular ischemic change. Small foci of chronic hemorrhage are seen in the deep nuclei, particularly the thalamus, and to a lesser degree the cortex and subcortical white matter of the cerebral hemispheres consistent with hypertensive cerebral vascular disease. Flow voids are maintained. Partial empty sella. No tonsillar herniation. Cervical spondylosis. BILATERAL cataract extraction. Chronic BILATERAL maxillary sinus disease but no sinus or mastoid air fluid level. Compared with 2014, similar appearance except for the acute infarct. MRA HEAD FINDINGS Widely patent and dolichoectatic internal carotid arteries and basilar artery. LEFT vertebral dominant with RIGHT vertebral supplying primarily the PICA. No MCA stenosis affecting the proximal M1 segments. Moderately diseased distal MCA branches, without definite M3 or beyond occlusion. Moderately diseased RIGHT A1 ACA. This is non worrisome given the robust LEFT A1 ACA. Fetal origin RIGHT PCA. LEFT PCA displays a moderate stenosis of the P2 segment. No intracranial aneurysm. No cerebellar branch occlusion is evident. Similar appearance to prior MRA. IMPRESSION: Acute RIGHT MCA lenticulostriate territory infarct  affecting the superior most insula as well as the subcortical and periventricular white matter of the RIGHT hemisphere. This is nonhemorrhagic. Advanced atrophy and small vessel disease. Numerous chronic microbleeds representing sequelae of hypertensive cerebrovascular disease. No proximal vascular occlusion or stenosis of significance. Electronically Signed   By: Staci Righter M.D.   On: 02/09/2015 20:13   Mr Brain Wo Contrast  02/09/2015  CLINICAL DATA:  Patient with dementia developed LEFT facial droop, LEFT face numbness and dysarthria earlier today. EXAM: MRI HEAD WITHOUT CONTRAST MRA HEAD WITHOUT CONTRAST TECHNIQUE: Multiplanar, multiecho pulse sequences of the brain and surrounding structures were obtained without intravenous contrast. Angiographic images of the head were obtained using MRA technique without contrast. COMPARISON:  CT head earlier today. MR head 12/29/2012. MRI and MRA 07/11/2012. FINDINGS: MRI HEAD FINDINGS There is a curvilinear area of restricted diffusion, affecting the superior most insula RIGHT as well as the subcortical and periventricular white matter on the RIGHT, just posterior to the lentiform nucleus, consistent with an acute lenticulostriate infarct. No other areas of restricted diffusion are seen. No acute hemorrhage, intra-axial mass lesion, or extra-axial fluid. There is  global atrophy with hydrocephalus ex vacuo. Significant prominence of the cortical sulci and extracerebral CSF spaces and cisterns. Moderately advanced T2 and FLAIR hyperintensities throughout the periventricular and subcortical white matter representing probable chronic microvascular ischemic change. Small foci of chronic hemorrhage are seen in the deep nuclei, particularly the thalamus, and to a lesser degree the cortex and subcortical white matter of the cerebral hemispheres consistent with hypertensive cerebral vascular disease. Flow voids are maintained. Partial empty sella. No tonsillar herniation.  Cervical spondylosis. BILATERAL cataract extraction. Chronic BILATERAL maxillary sinus disease but no sinus or mastoid air fluid level. Compared with 2014, similar appearance except for the acute infarct. MRA HEAD FINDINGS Widely patent and dolichoectatic internal carotid arteries and basilar artery. LEFT vertebral dominant with RIGHT vertebral supplying primarily the PICA. No MCA stenosis affecting the proximal M1 segments. Moderately diseased distal MCA branches, without definite M3 or beyond occlusion. Moderately diseased RIGHT A1 ACA. This is non worrisome given the robust LEFT A1 ACA. Fetal origin RIGHT PCA. LEFT PCA displays a moderate stenosis of the P2 segment. No intracranial aneurysm. No cerebellar branch occlusion is evident. Similar appearance to prior MRA. IMPRESSION: Acute RIGHT MCA lenticulostriate territory infarct affecting the superior most insula as well as the subcortical and periventricular white matter of the RIGHT hemisphere. This is nonhemorrhagic. Advanced atrophy and small vessel disease. Numerous chronic microbleeds representing sequelae of hypertensive cerebrovascular disease. No proximal vascular occlusion or stenosis of significance. Electronically Signed   By: Staci Righter M.D.   On: 02/09/2015 20:13    Assessment/Plan: Diagnosis: right MCA infarct with left inattention and hemiparesis/hemisensory deficits.  1. Does the need for close, 24 hr/day medical supervision in concert with the patient's rehab needs make it unreasonable for this patient to be served in a less intensive setting? Yes 2. Co-Morbidities requiring supervision/potential complications: htn, post-stroke sequelae 3. Due to bladder management, bowel management, safety, skin/wound care, disease management, medication administration, pain management and patient education, does the patient require 24 hr/day rehab nursing? Yes 4. Does the patient require coordinated care of a physician, rehab nurse, PT (1-2 hrs/day,  5 days/week), OT (1-2 hrs/day, 5 days/week) and SLP (1-2 hrs/day, 5 days/week) to address physical and functional deficits in the context of the above medical diagnosis(es)? Yes Addressing deficits in the following areas: balance, endurance, locomotion, strength, transferring, bowel/bladder control, bathing, dressing, feeding, grooming, toileting, cognition, speech and psychosocial support 5. Can the patient actively participate in an intensive therapy program of at least 3 hrs of therapy per day at least 5 days per week? Yes 6. The potential for patient to make measurable gains while on inpatient rehab is excellent 7. Anticipated functional outcomes upon discharge from inpatient rehab are modified independent and supervision  with PT, modified independent and supervision with OT, supervision with SLP. 8. Estimated rehab length of stay to reach the above functional goals is: 9-13 days 9. Does the patient have adequate social supports and living environment to accommodate these discharge functional goals? Yes 10. Anticipated D/C setting: Home 11. Anticipated post D/C treatments: HH therapy and Outpatient therapy 12. Overall Rehab/Functional Prognosis: excellent  RECOMMENDATIONS: This patient's condition is appropriate for continued rehabilitative care in the following setting: CIR Patient has agreed to participate in recommended program. Potentially Note that insurance prior authorization [place] be required for reimbursement for recommended care.  Comment: Rehab Admissions Coordinator to follow up.  Thanks,  Meredith Staggers, MD, Mellody Drown     02/10/2015

## 2015-02-10 NOTE — Progress Notes (Signed)
  Echocardiogram 2D Echocardiogram has been performed.  Donata Clay 02/10/2015, 10:48 AM

## 2015-02-11 DIAGNOSIS — G3183 Dementia with Lewy bodies: Secondary | ICD-10-CM

## 2015-02-11 DIAGNOSIS — N400 Enlarged prostate without lower urinary tract symptoms: Secondary | ICD-10-CM

## 2015-02-11 DIAGNOSIS — I10 Essential (primary) hypertension: Secondary | ICD-10-CM

## 2015-02-11 LAB — HEMOGLOBIN A1C
Hgb A1c MFr Bld: 5.3 % (ref 4.8–5.6)
Mean Plasma Glucose: 105 mg/dL

## 2015-02-11 MED ORDER — AMLODIPINE BESYLATE 10 MG PO TABS
10.0000 mg | ORAL_TABLET | Freq: Every day | ORAL | Status: DC
  Administered 2015-02-11 – 2015-02-12 (×2): 10 mg via ORAL
  Filled 2015-02-11 (×2): qty 1

## 2015-02-11 NOTE — Evaluation (Signed)
Speech Language Pathology Evaluation Patient Details Name: [name] MRN: [id number] DOB: [birth year] Today's Date: 02/11/2015 Time: 4098-1191 SLP Time Calculation (min) (ACUTE ONLY): 20 min  Problem List:  Patient Active Problem List   Diagnosis Date Noted  . Facial droop 02/09/2015  . Acute right MCA stroke ([place]) 02/09/2015  . Acute left-sided weakness 02/09/2015  . Essential hypertension 02/09/2015  . BPH (benign prostatic hyperplasia) 02/09/2015  . Lewy body dementia without behavioral disturbance   . CAP (community acquired pneumonia) 12/29/2014  . Hypoxia 12/29/2014  . Constipation 12/29/2014  . Hypothyroidism 12/29/2014  . Intertrochanteric fracture of left femur ([place])   . Fall at home 12/03/2014  . Advance care planning 09/29/2013  . Memory loss 09/29/2013  . Shingles 09/18/2013  . Dizziness and giddiness 12/29/2012  . Ascending aortic aneurysm ([place]) 07/19/2012  . TIA (transient ischemic attack) 07/11/2012  . Blurry vision 07/11/2012  . Balance problem 07/11/2012  . Intracranial atherosclerosis 07/11/2012  . Ventricular enlargement due to brain atrophy 07/11/2012  . Abnormal heart rhythm 07/11/2012  . Pulmonary nodule 04/29/2012  . Cough 02/05/2012  . Medicare annual wellness visit, subsequent 11/06/2011  . Renal mass, right 12/16/2010  . DEPRESSION 03/31/2010  . URINARY INCONTINENCE, MALE 03/31/2010  . GALLSTONE PANCREATITIS 11/03/2009  . FATIGUE 09/03/2009  . TREMOR, ESSENTIAL, LEFT HAND 03/05/2009  . Pure hypercholesterolemia 04/09/2007  . BENIGN PROSTATIC HYPERTROPHY 10/02/2006  . HYPOTHYROIDISM 09/28/2006  . HYPERTENSION 09/28/2006  . Barrett's esophagus 09/28/2006  . DIVERTICULOSIS, COLON 09/28/2006  . EPIDIDYMAL CYST 09/28/2006  . POLYPECTOMY, HX OF 09/28/2006   Past Medical History:  Past Medical History  Diagnosis Date  . Hypertension   . Hypothyroidism   . Diverticulosis of colon   . BPH (benign prostatic hypertrophy)   . Pancreatitis  2011    and gallstone pancreatitis and Ecoli bacteremia  . 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 followed by [name]- observation as of 09/2010  . Rabbit fever 1940  . History of shingles   . Dementia   . Pneumonia     2016   Past Surgical History:  Past Surgical History  Procedure Laterality Date  . Appendectomy  1959  . Thyroidectomy, partial  1966  . Knee arthroscopy  06/1995    left Dr Derrel Nip  . Joint replacement  04/30/2001    bilateral TKR  . Cholecystectomy  10/2009  . Eye muscle surgery      12/12 had double vision Dr. Juleen China [facility]  . Orif hip fracture Left 12/26/2014    Procedure: OPEN REDUCTION INTERNAL FIXATION HIP;  Surgeon: Paralee Cancel, MD;  Location: [facility];  Service: Orthopedics;  Laterality: Left;   HPI:  81 year old male left-hand dominant with a past medical history significant for TIA, HTN, dementia, hypothyroidism, left hip fracture on 9/22 and subsequent surgery on 9/23; who presents with complaints of left-sided facial droop and weakness. MRI of the brain showed acute right MCA stroke.   Assessment / Plan / Recommendation Clinical Impression  Pt has history of dementia with wife reporting he is not responsible for finances or money management at home and is typically not oriented to time. Pt is left handed placing him at slightly higher risk for aphasia with right brain infarct however language is functional. Speech is intelligible. Left sided saliva leakage due decreased sensation. Pt/wife educated to attend to oral saliva and swallow frequently and check for left sided saliva residual. SLP provided education re: assisting with pt's  orientation and organization in attempts to "keep pt at present cognitive level as long as able.      SLP Assessment  Patient does not need any further Speech Lanaguage Pathology Services    Follow Up Recommendations  None    Frequency and Duration        Pertinent Vitals/Pain Pain Assessment:  No/denies pain   SLP Goals     SLP Evaluation Prior Functioning  Cognitive/Linguistic Baseline: Baseline deficits Baseline deficit details:  (working memory, problem solving) Type of Home: House  Lives With: Spouse Available Help at Discharge: Family;Available 24 hours/day   Cognition  Overall Cognitive Status: History of cognitive impairments - at baseline Arousal/Alertness: Awake/alert Orientation Level: Oriented to situation;Oriented to person (not oriented to time at baseline, stated Nov correctly) Attention: Sustained Sustained Attention: Impaired Sustained Attention Impairment: Verbal basic Memory: Impaired Awareness: Impaired Problem Solving: Impaired Safety/Judgment: Impaired    Comprehension  Auditory Comprehension Overall Auditory Comprehension: Appears within functional limits for tasks assessed Commands: Within Functional Limits (simple 2 step) Visual Recognition/Discrimination Discrimination: Not tested Reading Comprehension Reading Status: Not tested    Expression Expression Primary Mode of Expression: Verbal Verbal Expression Overall Verbal Expression: Appears within functional limits for tasks assessed Pragmatics: No impairment Written Expression Dominant Hand: Left Written Expression: Not tested   Oral / Motor Oral Motor/Sensory Function Overall Oral Motor/Sensory Function: Impaired Labial ROM: Reduced left Labial Strength: Reduced Labial Sensation: Reduced Lingual ROM: Within Functional Limits Lingual Symmetry: Within Functional Limits Lingual Strength: Within Functional Limits Facial ROM: Within Functional Limits Mandible: Within Functional Limits Motor Speech Overall Motor Speech: Appears within functional limits for tasks assessed Intelligibility: Intelligible Motor Planning: Witnin functional limits   GO     Houston Siren 02/11/2015, 1:15 PM  Orbie Pyo Colvin Caroli.Ed [employer] [phone]

## 2015-02-11 NOTE — Clinical Social Work Note (Signed)
Clinical Social Work Assessment  Patient Details  Name: [name] MRN: [id number] Date of Birth: 07/14/1930  Date of referral:  02/11/15               Reason for consult:  Facility Placement                Permission sought to share information with:  Family Supports Permission granted to share information::     Name::     Reppert,Martha  Relationship::  wife   Contact Information:      Housing/Transportation Living arrangements for the past 2 months:  Single Family Home Source of Information:  Spouse Patient Interpreter Needed:  None Criminal Activity/Legal Involvement Pertinent to Current Situation/Hospitalization:  No - Comment as needed Significant Relationships:  Adult Children Lives with:  Spouse Do you feel safe going back to the place where you live?  No Need for family participation in patient care:  Yes (Comment)  Care giving concerns:  [name] expressed concerns with the SNF co-pays.    Social Worker assessment / plan: CSW met with the pt's wife [name] at the bedside. CSW introduced self and purpose of the visit. CSW discussed a preliminary discharge plan. [name] explained that the pt is now in his co-pay day. CSW confirmed with Florentina Jenny at [facility] that the pt co-pays are 160 per day. [name] explained the barrier to paying co-pays when she is unsure how long the pt will need rehab. CSW answered all questions in which [name] inquired about. CSW will continue to follow this pt and assist with discharge as needed.   Employment status:  Retired Nurse, adult PT Recommendations:  [place] / Referral to community resources:  [place]  Patient/Family's Response to care:  [name] reported that the pt has been well cared for.   Patient/Family's Understanding of and Emotional Response to Diagnosis, Current Treatment, and Prognosis: [name] acknowledged the pt's diagnosis and current treatment. [name] expressed  the desire for the pt to get well enough to go home.    Emotional Assessment Appearance:  Appears stated age Attitude/Demeanor/Rapport:  Unable to Assess Affect (typically observed):  Unable to Assess Orientation:  Oriented to Self Alcohol / Substance use:  Not Applicable Psych involvement (Current and /or in the community):  No (Comment)  Discharge Needs  Concerns to be addressed:  Financial / Insurance Concerns Readmission within the last 30 days:  No Current discharge risk:  None Barriers to Discharge:  No Barriers Identified   Urie Loughner, LCSW 02/11/2015, 2:19 PM

## 2015-02-11 NOTE — Progress Notes (Addendum)
Arrived to pt room with him sitting edge of bed incontinent of urine. Requesting to get up into chair. Assisted with CNA to [facility] and then OT arrived to assist pt up into chair. I met with pt and wife at bedside to discuss his rehab venue options. Pt with recent L hip fx and was at [facility] until 2 weeks ago. Wife reports pt was 50% WB to left hip and was to see Dr. Alvan Dame today to have Xray of hip and to hopefully change WB status. Wife requests ortho follow up here to assist with mobility limitations. Wife is agreeable to inpt rehab admit if [facility] will approve or return to [facility] if they will not approve. I will initiate insurance approval for possible admit.I will alert RN CM and SW. [phone]

## 2015-02-11 NOTE — Progress Notes (Signed)
Patient Demographics:    [name], is a 81 y.o. male, DOB - February 28, 1931, FOY:[id number]  Admit date - 02/09/2015   Admitting Physician Norval Morton, MD  Outpatient Primary MD for the patient is Elsie Stain, MD  LOS - 2   Chief Complaint  Patient presents with  . Code Stroke        Subjective:    [name] today has, No headache, No chest pain, No abdominal pain - No Nausea, No new weakness tingling or numbness, No Cough - SOB. He is weak all over   Assessment  & Plan :    1. Acute right MCA stroke.  MRI of head performed 02/09/2059 showing acute right MCA infarct involving lenticulostriate territory.  Transthoracic echocardiogram performed on 02/10/2015 showing EF of 65-70% without wall motion abnormalities, grade 1 diastolic dysfunction, LDL appears to be less than 70. A1c pending. Will be seen by PT-OT and speech. He most likely will require SNF placement due to underlying weakness and recent right hip fracture. Neurology recommended changing antiplatelet therapy to Plavix on 02/10/2015. We also recommended 30 day cardiac event monitor  2. Hypothyroidism. Continue home dose Synthroid TSH is stable.   3. Essential hypertension.  Blood pressures elevated with systolic blood pressures in the 160s, added Norvasc 10 mg by mouth daily   4. Recent mechanical fall at home with right femoral neck fracture. Months ago. Continues to be weak, initiate PT. [place] require placement.   5. BPH. Continue alpha blocker.   6. Early dementia. Continue donepezil. At risk for delirium. Minimize narcotics and benzodiazepine.   7. GERD. On PPI continue   8. Borderline UA. Monitor urine cultures and signs of any acute infection.    Code Status : Full  Family Communication  : Wife  bedside  Disposition Plan  : Likely SNF  Consults  :  Neurology  Procedures  :   Echogram, carotid duplex both pending.   CT head nonacute, MRI brain showing right MCA stroke.  DVT Prophylaxis  :  Lovenox    Lab Results  Component Value Date   PLT 235 02/09/2015    Inpatient Medications  Scheduled Meds: . acidophilus  1 capsule Oral Daily  . B-complex with vitamin C  1 tablet Oral Daily  . clopidogrel  75 mg Oral Daily  . donepezil  10 mg Oral Daily  . enoxaparin (LOVENOX) injection  40 mg Subcutaneous Q24H  . levothyroxine  100 mcg Oral QAC breakfast  . multivitamin with minerals  1 tablet Oral Daily  . pantoprazole  80 mg Oral Daily  . terazosin  5 mg Oral QHS   Continuous Infusions:   PRN Meds:.hydrALAZINE, HYDROcodone-acetaminophen, ondansetron, polyethylene glycol  Antibiotics  :    Anti-infectives    None        Objective:   Filed Vitals:   02/10/15 1836 02/10/15 2115 02/11/15 0114 02/11/15 0520  BP: 147/84 157/56 170/101 169/103  Pulse: 85 75 73 81  Temp: 98.9 F (37.2 C) 98.3 F (36.8 C) 98.5 F (36.9 C) 98.6 F (37 C)  TempSrc: Oral Oral Oral Oral  Resp: 18 18 18 16   Height:      Weight:      SpO2: 97% 96% 96% 97%  Wt Readings from Last 3 Encounters:  02/09/15 70.1 kg (154 lb 8.7 oz)  02/06/15 68.266 kg (150 lb 8 oz)  12/25/14 68.04 kg (150 lb)     Intake/Output Summary (Last 24 hours) at 02/11/15 1717 Last data filed at 02/11/15 0520  Gross per 24 hour  Intake      0 ml  Output    250 ml  Net   -250 ml     Physical Exam  Awake Alert, Oriented X 3, No new F.N deficits, continues to have some left-sided facial droop, Normal affect [place].AT,PERRAL Supple Neck,No JVD, No cervical lymphadenopathy appriciated.  Symmetrical Chest wall movement, Good air movement bilaterally, CTAB RRR,No Gallops,Rubs or new Murmurs, No Parasternal Heave +ve B.Sounds, Abd Soft, No tenderness, No organomegaly appriciated, No rebound - guarding or  rigidity. No Cyanosis, Clubbing or edema, No new Rash or bruise     Data Review:   Micro Results No results found for this or any previous visit (from the past 240 hour(s)).  Radiology Reports Dg Chest 2 View  02/10/2015  CLINICAL DATA:  Cough and weakness today EXAM: CHEST - 2 VIEW COMPARISON:  02/06/2015 FINDINGS: Cardiac shadow is at the upper limits of normal in size accentuated by the portable technique. Mild left basilar atelectasis is seen. No effusion is noted. No other focal infiltrative density is noted. Bony structures are stable. IMPRESSION: Mild left basilar atelectasis. Electronically Signed   By: Inez Catalina M.D.   On: 02/10/2015 10:03   Dg Chest 2 View  02/06/2015  CLINICAL DATA:  Community-acquired pneumonia EXAM: CHEST  2 VIEW COMPARISON:  12/26/2014 FINDINGS: COPD with pulmonary hyperinflation. Resolving left lower lobe pneumonia. Mild residual scar or atelectasis in the left lung base. No significant effusion. Right lung is clear. Bilateral nipple shadows. IMPRESSION: COPD with clearing of left lower lobe pneumonia. Electronically Signed   By: Franchot Gallo M.D.   On: 02/06/2015 16:03   Ct Head Wo Contrast  02/09/2015  CLINICAL DATA:  Code stroke, left facial droop, slurred speech EXAM: CT HEAD WITHOUT CONTRAST TECHNIQUE: Contiguous axial images were obtained from the base of the skull through the vertex without contrast. COMPARISON:  11/09/2014 FINDINGS: Advanced brain atrophy and chronic white matter microvascular ischemic change throughout the cerebral hemispheres. There is associated ventricular enlargement. No acute intracranial hemorrhage, definite infarction, midline shift, herniation, or extra-axial fluid collection. No focal mass effect or edema. Cisterns are patent. Cerebellar atrophy as well. Orbits are symmetric. Chronic maxillary sinus disease. Other sinuses and mastoids remain clear. Intact skull. IMPRESSION: Advanced atrophy, chronic small vessel ischemic change,  and ventricular enlargement. No interval change or acute finding by noncontrast CT These results were called by telephone at the time of interpretation on 02/09/2015 at 5:53 pm to Dr. Aram Beecham, who verbally acknowledged these results. Electronically Signed   By: Jerilynn Mages.  Shick M.D.   On: 02/09/2015 17:54   Mr Jodene Nam Head Wo Contrast  02/09/2015  CLINICAL DATA:  Patient with dementia developed LEFT facial droop, LEFT face numbness and dysarthria earlier today. EXAM: MRI HEAD WITHOUT CONTRAST MRA HEAD WITHOUT CONTRAST TECHNIQUE: Multiplanar, multiecho pulse sequences of the brain and surrounding structures were obtained without intravenous contrast. Angiographic images of the head were obtained using MRA technique without contrast. COMPARISON:  CT head earlier today. MR head 12/29/2012. MRI and MRA 07/11/2012. FINDINGS: MRI HEAD FINDINGS There is a curvilinear area of restricted diffusion, affecting the superior most insula RIGHT as well as the subcortical and periventricular white matter on  the RIGHT, just posterior to the lentiform nucleus, consistent with an acute lenticulostriate infarct. No other areas of restricted diffusion are seen. No acute hemorrhage, intra-axial mass lesion, or extra-axial fluid. There is global atrophy with hydrocephalus ex vacuo. Significant prominence of the cortical sulci and extracerebral CSF spaces and cisterns. Moderately advanced T2 and FLAIR hyperintensities throughout the periventricular and subcortical white matter representing probable chronic microvascular ischemic change. Small foci of chronic hemorrhage are seen in the deep nuclei, particularly the thalamus, and to a lesser degree the cortex and subcortical white matter of the cerebral hemispheres consistent with hypertensive cerebral vascular disease. Flow voids are maintained. Partial empty sella. No tonsillar herniation. Cervical spondylosis. BILATERAL cataract extraction. Chronic BILATERAL maxillary sinus disease but no sinus or  mastoid air fluid level. Compared with 2014, similar appearance except for the acute infarct. MRA HEAD FINDINGS Widely patent and dolichoectatic internal carotid arteries and basilar artery. LEFT vertebral dominant with RIGHT vertebral supplying primarily the PICA. No MCA stenosis affecting the proximal M1 segments. Moderately diseased distal MCA branches, without definite M3 or beyond occlusion. Moderately diseased RIGHT A1 ACA. This is non worrisome given the robust LEFT A1 ACA. Fetal origin RIGHT PCA. LEFT PCA displays a moderate stenosis of the P2 segment. No intracranial aneurysm. No cerebellar branch occlusion is evident. Similar appearance to prior MRA. IMPRESSION: Acute RIGHT MCA lenticulostriate territory infarct affecting the superior most insula as well as the subcortical and periventricular white matter of the RIGHT hemisphere. This is nonhemorrhagic. Advanced atrophy and small vessel disease. Numerous chronic microbleeds representing sequelae of hypertensive cerebrovascular disease. No proximal vascular occlusion or stenosis of significance. Electronically Signed   By: Staci Righter M.D.   On: 02/09/2015 20:13   Mr Brain Wo Contrast  02/09/2015  CLINICAL DATA:  Patient with dementia developed LEFT facial droop, LEFT face numbness and dysarthria earlier today. EXAM: MRI HEAD WITHOUT CONTRAST MRA HEAD WITHOUT CONTRAST TECHNIQUE: Multiplanar, multiecho pulse sequences of the brain and surrounding structures were obtained without intravenous contrast. Angiographic images of the head were obtained using MRA technique without contrast. COMPARISON:  CT head earlier today. MR head 12/29/2012. MRI and MRA 07/11/2012. FINDINGS: MRI HEAD FINDINGS There is a curvilinear area of restricted diffusion, affecting the superior most insula RIGHT as well as the subcortical and periventricular white matter on the RIGHT, just posterior to the lentiform nucleus, consistent with an acute lenticulostriate infarct. No other  areas of restricted diffusion are seen. No acute hemorrhage, intra-axial mass lesion, or extra-axial fluid. There is global atrophy with hydrocephalus ex vacuo. Significant prominence of the cortical sulci and extracerebral CSF spaces and cisterns. Moderately advanced T2 and FLAIR hyperintensities throughout the periventricular and subcortical white matter representing probable chronic microvascular ischemic change. Small foci of chronic hemorrhage are seen in the deep nuclei, particularly the thalamus, and to a lesser degree the cortex and subcortical white matter of the cerebral hemispheres consistent with hypertensive cerebral vascular disease. Flow voids are maintained. Partial empty sella. No tonsillar herniation. Cervical spondylosis. BILATERAL cataract extraction. Chronic BILATERAL maxillary sinus disease but no sinus or mastoid air fluid level. Compared with 2014, similar appearance except for the acute infarct. MRA HEAD FINDINGS Widely patent and dolichoectatic internal carotid arteries and basilar artery. LEFT vertebral dominant with RIGHT vertebral supplying primarily the PICA. No MCA stenosis affecting the proximal M1 segments. Moderately diseased distal MCA branches, without definite M3 or beyond occlusion. Moderately diseased RIGHT A1 ACA. This is non worrisome given the robust LEFT A1 ACA. Fetal  origin RIGHT PCA. LEFT PCA displays a moderate stenosis of the P2 segment. No intracranial aneurysm. No cerebellar branch occlusion is evident. Similar appearance to prior MRA. IMPRESSION: Acute RIGHT MCA lenticulostriate territory infarct affecting the superior most insula as well as the subcortical and periventricular white matter of the RIGHT hemisphere. This is nonhemorrhagic. Advanced atrophy and small vessel disease. Numerous chronic microbleeds representing sequelae of hypertensive cerebrovascular disease. No proximal vascular occlusion or stenosis of significance. Electronically Signed   By: Staci Righter M.D.   On: 02/09/2015 20:13     CBC  Recent Labs Lab 02/06/15 1226 02/09/15 1750 02/09/15 1755  WBC 8.4  --  6.8  HGB 14.5 16.3 14.3  HCT 45.3 48.0 44.1  PLT 309.0  --  235  MCV 87.7  --  88.2  MCH  --   --  28.6  MCHC 32.0  --  32.4  RDW 16.4*  --  14.5  LYMPHSABS 1.2  --  1.3  MONOABS 0.4  --  0.9  EOSABS 0.1  --  0.1  BASOSABS 0.1  --  0.0    Chemistries   Recent Labs Lab 02/09/15 1750 02/09/15 1755  NA 142 139  K 3.7 3.7  CL 104 106  CO2  --  24  GLUCOSE 100* 108*  BUN 35* 31*  CREATININE 1.20 1.26*  CALCIUM  --  9.3  AST  --  41  ALT  --  35  ALKPHOS  --  102  BILITOT  --  0.6   ------------------------------------------------------------------------------------------------------------------ estimated creatinine clearance is 44.8 mL/min (by C-G formula based on Cr of 1.26). ------------------------------------------------------------------------------------------------------------------  Recent Labs  02/09/15 1755  HGBA1C 5.3   ------------------------------------------------------------------------------------------------------------------  Recent Labs  02/10/15 0455  CHOL 108  HDL 43  LDLCALC 55  TRIG 50  CHOLHDL 2.5   ------------------------------------------------------------------------------------------------------------------  Recent Labs  02/10/15 0455  TSH 0.809   ------------------------------------------------------------------------------------------------------------------ No results for input(s): VITAMINB12, FOLATE, FERRITIN, TIBC, IRON, RETICCTPCT in the last 72 hours.  Coagulation profile  Recent Labs Lab 02/09/15 1755  INR 1.05    No results for input(s): DDIMER in the last 72 hours.  Cardiac Enzymes No results for input(s): CKMB, TROPONINI, MYOGLOBIN in the last 168 hours.  Invalid input(s):  CK ------------------------------------------------------------------------------------------------------------------ Invalid input(s): POCBNP   Time Spent in minutes  25   Kelvin Cellar M.D on 02/11/2015 at 5:17 PM  Between 7am to 7pm - Pager - [phone]  After 7pm go to [email or web address] - password [facility]  [facility] -  Office  [phone]

## 2015-02-11 NOTE — Evaluation (Signed)
Occupational Therapy Evaluation Patient Details Name: [name] MRN: [id number] DOB: 1930/06/04 Today's Date: 02/11/2015    History of Present Illness pt is an 81 y/o male with dementia and recent h/o L hip fx and ORIF, admitted with L facial droop, slurred speech and L side weakness.  MRI shows R sided MCA CVA.   Clinical Impression   Pt has been assisted with bathing, dressing and toileting and ambulating with a RW since discharge from SNF following hip fx. Pt demonstrating ability to use L UE functionally for self feeding and to assist with transfer.  Presents with impaired balance and required 2 person assist for transfer this visit.  Will follow acutely. Recommending SNF.   Follow Up Recommendations  CIR;Supervision/Assistance - 24 hour    Equipment Recommendations   (to be determined in next venue)    Recommendations for Other Services       Precautions / Restrictions Precautions Precautions: Fall Restrictions Other Position/Activity Restrictions: per wife, pt left SNF with 50% WB precautions on his L LE, need clarification      Mobility Bed Mobility               General bed mobility comments: pt seated on 3 in 1 upon arrival   Transfers Overall transfer level: Needs assistance Equipment used: 2 person hand held assist Transfers: Sit to/from Stand Sit to Stand: +2 physical assistance;Mod assist Stand pivot transfers: +2 physical assistance;Mod assist       General transfer comment: assist to rise and for stability with pt reaching for arm of chair    Balance Overall balance assessment: Needs assistance Sitting-balance support: Feet supported Sitting balance-Leahy Scale: Fair       Standing balance-Leahy Scale: Poor                              ADL Overall ADL's : Needs assistance/impaired Eating/Feeding: Sitting;Moderate assistance Eating/Feeding Details (indicate cue type and reason): assist to set up plate and load utensil at  times with L hand             Upper Body Dressing : Maximal assistance;Sitting   Lower Body Dressing: Maximal assistance;Sit to/from stand   Toilet Transfer: +2 for physical assistance;Moderate assistance;Stand-pivot;BSC   Toileting- Clothing Manipulation and Hygiene: Total assistance;+2 for physical assistance;Sit to/from stand               Vision Vision Assessment?: No apparent visual deficits   Perception     Praxis      Pertinent Vitals/Pain Pain Assessment: Faces Faces Pain Scale: Hurts little more Pain Location: L hip Pain Descriptors / Indicators: Sore Pain Intervention(s): Repositioned     Hand Dominance Left   Extremity/Trunk Assessment Upper Extremity Assessment Upper Extremity Assessment: LUE deficits/detail LUE Deficits / Details: Pt with ability to use L hand to reach forward, full AROM elbow, forearm, wrist, hand.  LUE Coordination: decreased fine motor;decreased gross motor   Lower Extremity Assessment Lower Extremity Assessment: Defer to PT evaluation       Communication Communication Communication: HOH   Cognition Arousal/Alertness: Awake/alert Behavior During Therapy: Agitated;WFL for tasks assessed/performed (agitated initially) Overall Cognitive Status: History of cognitive impairments - at baseline                     General Comments       Exercises       Shoulder Instructions      Home Living  Family/patient expects to be discharged to:: Unsure Living Arrangements: Spouse/significant other (son) Available Help at Discharge: Family;Available 24 hours/day Type of Home: House Home Access: Ramped entrance     Home Layout: One level     Bathroom Shower/Tub: Occupational psychologist: Standard Bathroom Accessibility: Yes   Home Equipment: Environmental consultant - 2 wheels;Bedside commode;Shower seat;Grab bars - toilet;Grab bars - tub/shower;Wheelchair - manual          Prior Functioning/Environment Level of  Independence: Needs assistance  Gait / Transfers Assistance Needed: assisted with RW or transfer to the chair ADL's / Homemaking Assistance Needed: wife has been assisting since his hip fx in September with bathing, dressing.         OT Diagnosis: Generalized weakness;Cognitive deficits;Acute pain   OT Problem List: Decreased strength;Decreased activity tolerance;Impaired balance (sitting and/or standing);Decreased coordination;Decreased cognition;Decreased safety awareness;Decreased knowledge of use of DME or AE;Impaired UE functional use;Pain   OT Treatment/Interventions: Self-care/ADL training;Neuromuscular education;Therapeutic activities;Patient/family education;Balance training    OT Goals(Current goals can be found in the care plan section) Acute Rehab OT Goals Patient Stated Goal: home OT Goal Formulation: With family Time For Goal Achievement: 02/25/15 Potential to Achieve Goals: Good ADL Goals Pt Will Perform Eating: with supervision;sitting Pt Will Perform Grooming: with supervision;sitting Pt Will Perform Upper Body Bathing: with min assist;sitting Pt Will Perform Lower Body Bathing: with mod assist;sit to/from stand Pt Will Perform Upper Body Dressing: with supervision;sitting Pt Will Perform Lower Body Dressing: sit to/from stand;with min assist Pt Will Transfer to Toilet: with min assist;ambulating;bedside commode Pt Will Perform Toileting - Clothing Manipulation and hygiene: with min assist;sit to/from stand  OT Frequency: Min 2X/week   Barriers to D/C:            Co-evaluation              End of Session Nurse Communication: Mobility status  Activity Tolerance: Treatment limited secondary to agitation Patient left: in chair;with call bell/phone within reach;with family/visitor present   Time: 4174-0814 OT Time Calculation (min): 27 min Charges:  OT General Charges $OT Visit: 1 Procedure OT Evaluation $Initial OT Evaluation Tier I: 1 Procedure OT  Treatments $Self Care/Home Management : 8-22 mins G-Codes:    Clavin, Ruhlman 02/11/2015, 9:50 AM  [phone]

## 2015-02-12 DIAGNOSIS — F0280 Dementia in other diseases classified elsewhere without behavioral disturbance: Secondary | ICD-10-CM

## 2015-02-12 MED ORDER — AMLODIPINE BESYLATE 10 MG PO TABS: 10.0000 mg | ORAL_TABLET | Freq: Every day | ORAL | Status: DC

## 2015-02-12 MED ORDER — CLOPIDOGREL BISULFATE 75 MG PO TABS: 75.0000 mg | ORAL_TABLET | Freq: Every day | ORAL | Status: DC

## 2015-02-12 NOTE — Progress Notes (Signed)
I met with pt's wife at bedside. I have insurance approval to admit pt to inpt rehab today and I discussed copays for inpt rehab vs SNF. Wife inquiring into taking pt home directly home.  She is contacting her children this morning to discuss their preference. I will return at 11 am for her decision. I will update RN CM and SW. [phone]

## 2015-02-12 NOTE — Progress Notes (Signed)
Requested home health orders and clinicals were faxed to [facility] for discharge today.  Lorne Skeens RN, MSN [phone]

## 2015-02-12 NOTE — Care Management Note (Signed)
Case Management Note  Patient Details  Name: Eilan Mcinerny Honsinger MRN: [id number] Date of Birth: 1930-05-28  Subjective/Objective:                    Action/Plan: CM met with patient's wife to discuss discharge planning. Patient's wife has chosen to take patient home with home health services. Patient is active with [facility] for HHPT/OT and would like to resume services at discharge.  CM spoke with Judeen Hammans at [facility] to notify of patient's discharge. Will fax orders when available. MD aware and will place orders.  Expected Discharge Date:  02/12/15               Expected Discharge Plan:  [place]  In-House Referral:     Discharge planning Services  CM Consult  Post Acute Care Choice:    Choice offered to:  Spouse  DME Arranged:    DME Agency:     HH Arranged:  PT, OT HH Agency:  [facility]  Status of Service:  Completed, signed off  Medicare Important Message Given:    Date Medicare IM Given:    Medicare IM give by:    Date Additional Medicare IM Given:    Additional Medicare Important Message give by:     If discussed at [place] of Stay Meetings, dates discussed:    Additional CommentsRolm Baptise, RN 02/12/2015, 11:28 AM [phone]

## 2015-02-12 NOTE — Progress Notes (Signed)
Discharge orders received, Pt for discharge home. IV d/c'd. D/c instructions and RX given with verbalized understanding. Family at bedside to assist patient with discharge. Staff bought pt downstairs via wheelchair. 02/12/15 1408

## 2015-02-12 NOTE — Progress Notes (Signed)
Physical Therapy Treatment Patient Details Name: [name] MRN: [id number] DOB: Aug 01, 1930 Today's Date: 02/12/2015    History of Present Illness pt is an 81 y/o male with dementia and recent h/o L hip fx and ORIF, admitted with L facial droop, slurred speech and L side weakness.  MRI shows R sided MCA CVA.    PT Comments    Progressing slowly.  Needs a significant amount of rehab, but insurance will only cover HHPT at 2-3x/wk   Follow Up Recommendations  Home health PT (insurance to paying for SNF rehab.)     Equipment Recommendations  None recommended by PT    Recommendations for Other Services       Precautions / Restrictions Precautions Precautions: Fall    Mobility  Bed Mobility Overal bed mobility: Needs Assistance Bed Mobility: Supine to Sit   Sidelying to sit: Min assist       General bed mobility comments: pt needed some help with trunk and legs, but once upright, needed only support while he scoot to EOB  Transfers Overall transfer level: Needs assistance Equipment used: Rolling walker (2 wheeled);2 person hand held assist Transfers: Sit to/from Stand Sit to Stand: +2 physical assistance;Mod assist         General transfer comment: assist to come forward, poser up and control for moderate to heavy list L.  Ambulation/Gait Ambulation/Gait assistance: Mod assist;Max assist;+2 safety/equipment Ambulation Distance (Feet): 10 Feet Assistive device: Rolling walker (2 wheeled) Gait Pattern/deviations: Step-to pattern;Step-through pattern     General Gait Details: moderate to heavy list  with up in the RW.  Increasingly more difficult to manage w/shift to [facility] LE to advance each foot.  With fatigue pt became unsafe to assist further   Stairs            Wheelchair Mobility    Modified Rankin (Stroke Patients Only) Modified Rankin (Stroke Patients Only) Pre-Morbid Rankin Score: No significant disability Modified Rankin: Severe  disability     Balance Overall balance assessment: Needs assistance Sitting-balance support: Single extremity supported Sitting balance-Leahy Scale: Fair Sitting balance - Comments: lists left with difficulty getting upright to midline and drifts posteriorly with physical or verbal cues.   Standing balance support: Bilateral upper extremity supported Standing balance-Leahy Scale: Poor Standing balance comment: Again lists left and worsens with fatigue                    Cognition Arousal/Alertness: Awake/alert Behavior During Therapy: WFL for tasks assessed/performed Overall Cognitive Status: History of cognitive impairments - at baseline                      Exercises      General Comments        Pertinent Vitals/Pain Pain Assessment: Faces Faces Pain Scale: Hurts little more Pain Location: L hip/leg Pain Descriptors / Indicators: Grimacing;Discomfort Pain Intervention(s): Monitored during session;Repositioned    Home Living                      Prior Function            PT Goals (current goals can now be found in the care plan section) Acute Rehab PT Goals Patient Stated Goal: home PT Goal Formulation: With patient Time For Goal Achievement: 02/24/15 Potential to Achieve Goals: Good Progress towards PT goals: Progressing toward goals    Frequency  Min 3X/week    PT Plan Discharge plan needs to be updated  Co-evaluation             End of Session   Activity Tolerance: Patient limited by fatigue Patient left: in chair;with call bell/phone within reach;with family/visitor present     Time: [id number] PT Time Calculation (min) (ACUTE ONLY): 36 min  Charges:  $Therapeutic Activity: 23-37 mins                    G Codes:      [name], [name] 02/12/2015, 5:28 PM 02/12/2015  [name], PT [phone]  (pager)

## 2015-02-12 NOTE — Discharge Summary (Signed)
Physician Discharge Summary  [name] [id number] DOB: [birth year] DOA: 02/09/2015  PCP: Elsie Stain, MD  Admit date: 02/09/2015 Discharge date: 02/12/2015  Time spent: 35 minutes  Recommendations for Outpatient Follow-up:  1. Patient was discharged home with home services; PT and RN  2. Please follow-up on blood pressures 3. He was given a 2 month follow-up appointment with neurology  Discharge Diagnoses:  Principal Problem:   Acute right MCA stroke ([place]) Active Problems:   Pure hypercholesterolemia   Intertrochanteric fracture of left femur (HCC)   Hypothyroidism   Facial droop   Acute left-sided weakness   Essential hypertension   BPH (benign prostatic hyperplasia)   Lewy body dementia without behavioral disturbance   Discharge Condition: Stable/improved  Diet recommendation: Heart healthy  Filed Weights   02/09/15 1755 02/09/15 2100  Weight: 69.854 kg (154 lb) 70.1 kg (154 lb 8.7 oz)    History of present illness:  Patient is a 81 year old male left-hand dominant with a past medical history significant for TIA, HTN, dementia, hypothyroidism, left hip fracture on 9/22 and subsequent surgery on 9/23; who presents with complaints of left-sided facial droop and weakness. Patient's history is obtained by the patient's wife who is present at bedside. Patient was last seen to be normal around noon and later this afternoon was seen to have some left-sided facial drooping by his son. There was some slurred speech and the patient was noted to be showing some signs of weakness in trying to transfer and walk. He had been receiving physical therapy at home after his previous hip fracture. At baseline patient was noted to have been able to transfer and able to walk some short distances with a rolling walker. He had been unable to use the bathroom or bathe without assistance. Associated symptoms included reports of numbness of the left side of his face .  Hospital Course:  [name] is a pleasant 81 year old gentleman with a past medical history of advanced dementia, hypertension, recurrent TIAs, having a recent hip fracture on 12/25/2014 admitted to medicine service on 02/09/2015 when he presented with complaints of left-sided facial droop and hemiparesis. He was initially worked up with a CT scan of brain which did not show acute intracranial abnormality. This is follow-up with an MRI that revealed an acute right MCA territory infarct. He was seen and evaluated by neurology. Stroke workup included a transthoracic echocardiogram performed on 02/10/2015 that showed a preserved ejection fraction of 65-70% without wall motion abnormalities and grade 1 diastolic dysfunction. Carotid Dopplers performed on 02/10/2015 showed bilateral 1-39% ICA stenosis. Neurology recommended treating with Plavix any 5 mg by mouth daily. Inpatient rehabilitation have been recommended however family members expressed concerns over at a pocket expense. They expressed desire for him to go home with home health services. Prior to discharge she was set up with home health physical therapy and RN. He was discharged in stable condition on 02/12/2015.   Procedures:  Transthoracic echocardiogram performed on 02/10/2015 impression: - Left ventricle: The cavity size was normal. There was moderate focal basal and mild concentric hypertrophy. Systolic function was vigorous. The estimated ejection fraction was in the range of 65% to 70%. Wall motion was normal; there were no regional wall motion abnormalities. Doppler parameters are consistent with abnormal left ventricular relaxation (grade 1 diastolic dysfunction).  Consultations:  Neurology  Discharge Exam: Filed Vitals:   02/12/15 0637  BP: 151/92  Pulse: 92  Temp: 98.3 F (36.8 C)  Resp: 20    Awake  Alert, Oriented X 3, No new F.N deficits, continues to have some left-sided facial droop, Normal affect Barry.AT,PERRAL Supple Neck,No  JVD, No cervical lymphadenopathy appriciated.  Symmetrical Chest wall movement, Good air movement bilaterally, CTAB RRR,No Gallops,Rubs or new Murmurs, No Parasternal Heave +ve B.Sounds, Abd Soft, No tenderness, No organomegaly appriciated, No rebound - guarding or rigidity. No Cyanosis, Clubbing or edema, No new Rash or bruise   Discharge Instructions   Discharge Instructions    Ambulatory referral to Neurology    Complete by:  As directed   Pt will follow up with Dr. Erlinda Hong at [facility] in about 2 months. Thanks.     Call MD for:  difficulty breathing, headache or visual disturbances    Complete by:  As directed      Call MD for:  extreme fatigue    Complete by:  As directed      Call MD for:  hives    Complete by:  As directed      Call MD for:  persistant dizziness or light-headedness    Complete by:  As directed      Call MD for:  persistant nausea and vomiting    Complete by:  As directed      Call MD for:  redness, tenderness, or signs of infection (pain, swelling, redness, odor or green/yellow discharge around incision site)    Complete by:  As directed      Call MD for:  severe uncontrolled pain    Complete by:  As directed      Call MD for:  temperature >100.4    Complete by:  As directed      Call MD for:    Complete by:  As directed      Diet - low sodium heart healthy    Complete by:  As directed      Increase activity slowly    Complete by:  As directed           Current Discharge Medication List    START taking these medications   Details  amLODipine (NORVASC) 10 MG tablet Take 1 tablet (10 mg total) by mouth daily. Qty: 30 tablet, Refills: 1    clopidogrel (PLAVIX) 75 MG tablet Take 1 tablet (75 mg total) by mouth daily. Qty: 30 tablet, Refills: 1      CONTINUE these medications which have NOT CHANGED   Details  B Complex-C (B-COMPLEX WITH VITAMIN C) tablet Take 1 tablet by mouth daily. Qty: 30 tablet, Refills: 3    donepezil (ARICEPT) 10 MG tablet Take 1  tablet (10 mg total) by mouth daily.    HYDROcodone-acetaminophen (NORCO/VICODIN) 5-325 MG per tablet Take 1-2 tablets by mouth every 6 (six) hours as needed for moderate pain. Qty: 60 tablet, Refills: 0    levothyroxine (SYNTHROID, LEVOTHROID) 100 MCG tablet TAKE 1 TABLET EVERY DAY Qty: 90 tablet, Refills: 1    Multiple Vitamin (MULTIVITAMIN) tablet Take 1 tablet by mouth daily.      omeprazole (PRILOSEC) 40 MG capsule Take 40 mg by mouth daily.     ondansetron (ZOFRAN) 4 MG tablet Take 1 tablet (4 mg total) by mouth every 6 (six) hours as needed for nausea. Qty: 20 tablet, Refills: 0    polyethylene glycol (MIRALAX / GLYCOLAX) packet Take 17 g by mouth daily. Qty: 14 each, Refills: 0    Probiotic Product (PROBIOTIC PO) Take 1 tablet by mouth daily.    terazosin (HYTRIN) 5 MG capsule Take 1 capsule (  5 mg total) by mouth at bedtime. Qty: 90 capsule, Refills: 3    ferrous sulfate 325 (65 FE) MG tablet Take 1 tablet (325 mg total) by mouth 2 (two) times daily with a meal. Refills: 3      STOP taking these medications     aspirin EC 325 MG EC tablet        Allergies  Allergen Reactions  . Sulfonamide Derivatives Other (See Comments)    Childhood allergy  . Valtrex [Valacyclovir Hcl]     NAV   Follow-up Information    Follow up with Xu,Jindong, MD. Schedule an appointment as soon as possible for a visit in 2 months.   Specialty:  Neurology   Why:  stroke clinic   Contact information:   [address] [place] [address] [phone]       Follow up with Elsie Stain, MD In 2 weeks.   Specialty:  Family Medicine   Contact information:   [address] [place] [address] [phone]        The results of significant diagnostics from this hospitalization (including imaging, microbiology, ancillary and laboratory) are listed below for reference.    Significant Diagnostic Studies: Dg Chest 2 View  02/10/2015  CLINICAL DATA:  Cough  and weakness today EXAM: CHEST - 2 VIEW COMPARISON:  02/06/2015 FINDINGS: Cardiac shadow is at the upper limits of normal in size accentuated by the portable technique. Mild left basilar atelectasis is seen. No effusion is noted. No other focal infiltrative density is noted. Bony structures are stable. IMPRESSION: Mild left basilar atelectasis. Electronically Signed   By: Inez Catalina M.D.   On: 02/10/2015 10:03   Dg Chest 2 View  02/06/2015  CLINICAL DATA:  Community-acquired pneumonia EXAM: CHEST  2 VIEW COMPARISON:  12/26/2014 FINDINGS: COPD with pulmonary hyperinflation. Resolving left lower lobe pneumonia. Mild residual scar or atelectasis in the left lung base. No significant effusion. Right lung is clear. Bilateral nipple shadows. IMPRESSION: COPD with clearing of left lower lobe pneumonia. Electronically Signed   By: Franchot Gallo M.D.   On: 02/06/2015 16:03   Ct Head Wo Contrast  02/09/2015  CLINICAL DATA:  Code stroke, left facial droop, slurred speech EXAM: CT HEAD WITHOUT CONTRAST TECHNIQUE: Contiguous axial images were obtained from the base of the skull through the vertex without contrast. COMPARISON:  11/09/2014 FINDINGS: Advanced brain atrophy and chronic white matter microvascular ischemic change throughout the cerebral hemispheres. There is associated ventricular enlargement. No acute intracranial hemorrhage, definite infarction, midline shift, herniation, or extra-axial fluid collection. No focal mass effect or edema. Cisterns are patent. Cerebellar atrophy as well. Orbits are symmetric. Chronic maxillary sinus disease. Other sinuses and mastoids remain clear. Intact skull. IMPRESSION: Advanced atrophy, chronic small vessel ischemic change, and ventricular enlargement. No interval change or acute finding by noncontrast CT These results were called by telephone at the time of interpretation on 02/09/2015 at 5:53 pm to Dr. Aram Beecham, who verbally acknowledged these results. Electronically Signed    By: Jerilynn Mages.  Shick M.D.   On: 02/09/2015 17:54   Mr Jodene Nam Head Wo Contrast  02/09/2015  CLINICAL DATA:  Patient with dementia developed LEFT facial droop, LEFT face numbness and dysarthria earlier today. EXAM: MRI HEAD WITHOUT CONTRAST MRA HEAD WITHOUT CONTRAST TECHNIQUE: Multiplanar, multiecho pulse sequences of the brain and surrounding structures were obtained without intravenous contrast. Angiographic images of the head were obtained using MRA technique without contrast. COMPARISON:  CT head earlier today. MR  head 12/29/2012. MRI and MRA 07/11/2012. FINDINGS: MRI HEAD FINDINGS There is a curvilinear area of restricted diffusion, affecting the superior most insula RIGHT as well as the subcortical and periventricular white matter on the RIGHT, just posterior to the lentiform nucleus, consistent with an acute lenticulostriate infarct. No other areas of restricted diffusion are seen. No acute hemorrhage, intra-axial mass lesion, or extra-axial fluid. There is global atrophy with hydrocephalus ex vacuo. Significant prominence of the cortical sulci and extracerebral CSF spaces and cisterns. Moderately advanced T2 and FLAIR hyperintensities throughout the periventricular and subcortical white matter representing probable chronic microvascular ischemic change. Small foci of chronic hemorrhage are seen in the deep nuclei, particularly the thalamus, and to a lesser degree the cortex and subcortical white matter of the cerebral hemispheres consistent with hypertensive cerebral vascular disease. Flow voids are maintained. Partial empty sella. No tonsillar herniation. Cervical spondylosis. BILATERAL cataract extraction. Chronic BILATERAL maxillary sinus disease but no sinus or mastoid air fluid level. Compared with 2014, similar appearance except for the acute infarct. MRA HEAD FINDINGS Widely patent and dolichoectatic internal carotid arteries and basilar artery. LEFT vertebral dominant with RIGHT vertebral supplying  primarily the PICA. No MCA stenosis affecting the proximal M1 segments. Moderately diseased distal MCA branches, without definite M3 or beyond occlusion. Moderately diseased RIGHT A1 ACA. This is non worrisome given the robust LEFT A1 ACA. Fetal origin RIGHT PCA. LEFT PCA displays a moderate stenosis of the P2 segment. No intracranial aneurysm. No cerebellar branch occlusion is evident. Similar appearance to prior MRA. IMPRESSION: Acute RIGHT MCA lenticulostriate territory infarct affecting the superior most insula as well as the subcortical and periventricular white matter of the RIGHT hemisphere. This is nonhemorrhagic. Advanced atrophy and small vessel disease. Numerous chronic microbleeds representing sequelae of hypertensive cerebrovascular disease. No proximal vascular occlusion or stenosis of significance. Electronically Signed   By: Staci Righter M.D.   On: 02/09/2015 20:13   Mr Brain Wo Contrast  02/09/2015  CLINICAL DATA:  Patient with dementia developed LEFT facial droop, LEFT face numbness and dysarthria earlier today. EXAM: MRI HEAD WITHOUT CONTRAST MRA HEAD WITHOUT CONTRAST TECHNIQUE: Multiplanar, multiecho pulse sequences of the brain and surrounding structures were obtained without intravenous contrast. Angiographic images of the head were obtained using MRA technique without contrast. COMPARISON:  CT head earlier today. MR head 12/29/2012. MRI and MRA 07/11/2012. FINDINGS: MRI HEAD FINDINGS There is a curvilinear area of restricted diffusion, affecting the superior most insula RIGHT as well as the subcortical and periventricular white matter on the RIGHT, just posterior to the lentiform nucleus, consistent with an acute lenticulostriate infarct. No other areas of restricted diffusion are seen. No acute hemorrhage, intra-axial mass lesion, or extra-axial fluid. There is global atrophy with hydrocephalus ex vacuo. Significant prominence of the cortical sulci and extracerebral CSF spaces and  cisterns. Moderately advanced T2 and FLAIR hyperintensities throughout the periventricular and subcortical white matter representing probable chronic microvascular ischemic change. Small foci of chronic hemorrhage are seen in the deep nuclei, particularly the thalamus, and to a lesser degree the cortex and subcortical white matter of the cerebral hemispheres consistent with hypertensive cerebral vascular disease. Flow voids are maintained. Partial empty sella. No tonsillar herniation. Cervical spondylosis. BILATERAL cataract extraction. Chronic BILATERAL maxillary sinus disease but no sinus or mastoid air fluid level. Compared with 2014, similar appearance except for the acute infarct. MRA HEAD FINDINGS Widely patent and dolichoectatic internal carotid arteries and basilar artery. LEFT vertebral dominant with RIGHT vertebral supplying primarily the PICA. No  MCA stenosis affecting the proximal M1 segments. Moderately diseased distal MCA branches, without definite M3 or beyond occlusion. Moderately diseased RIGHT A1 ACA. This is non worrisome given the robust LEFT A1 ACA. Fetal origin RIGHT PCA. LEFT PCA displays a moderate stenosis of the P2 segment. No intracranial aneurysm. No cerebellar branch occlusion is evident. Similar appearance to prior MRA. IMPRESSION: Acute RIGHT MCA lenticulostriate territory infarct affecting the superior most insula as well as the subcortical and periventricular white matter of the RIGHT hemisphere. This is nonhemorrhagic. Advanced atrophy and small vessel disease. Numerous chronic microbleeds representing sequelae of hypertensive cerebrovascular disease. No proximal vascular occlusion or stenosis of significance. Electronically Signed   By: Staci Righter M.D.   On: 02/09/2015 20:13    Microbiology: No results found for this or any previous visit (from the past 240 hour(s)).   Labs: Basic Metabolic Panel:  Recent Labs Lab 02/09/15 1750 02/09/15 1755  NA 142 139  K 3.7 3.7   CL 104 106  CO2  --  24  GLUCOSE 100* 108*  BUN 35* 31*  CREATININE 1.20 1.26*  CALCIUM  --  9.3   Liver Function Tests:  Recent Labs Lab 02/09/15 1755  AST 41  ALT 35  ALKPHOS 102  BILITOT 0.6  PROT 7.0  ALBUMIN 3.7   No results for input(s): LIPASE, AMYLASE in the last 168 hours. No results for input(s): AMMONIA in the last 168 hours. CBC:  Recent Labs Lab 02/06/15 1226 02/09/15 1750 02/09/15 1755  WBC 8.4  --  6.8  NEUTROABS 6.7  --  4.5  HGB 14.5 16.3 14.3  HCT 45.3 48.0 44.1  MCV 87.7  --  88.2  PLT 309.0  --  235   Cardiac Enzymes: No results for input(s): CKTOTAL, CKMB, CKMBINDEX, TROPONINI in the last 168 hours. BNP: BNP (last 3 results) No results for input(s): BNP in the last 8760 hours.  ProBNP (last 3 results) No results for input(s): PROBNP in the last 8760 hours.  CBG: No results for input(s): GLUCAP in the last 168 hours.     SignedKelvin Cellar  [facility] 02/12/2015, 11:35 AM

## 2015-02-12 NOTE — Progress Notes (Signed)
I returned to pt's bedside to meet with wife concerning her decision for dispo. She has spoken with family and wishes to dc home today with Melvina. I have made MD, RN CM and SW aware. [phone]

## 2015-02-12 NOTE — Care Management Important Message (Signed)
Important Message  Patient Details  Name: [name] MRN: [id number] Date of Birth: 06/15/1930   Medicare Important Message Given:  Yes    [name] [name] 02/12/2015, 3:24 PM

## 2015-02-12 NOTE — Progress Notes (Signed)
Patients spouse called the nurses station to report that the patient had fallen when she had attempted to stand the patient up unassisted. The Patient fell because the chair was not locked and there was not a chair alarm present. The patient was assisted back to bed; patient showed no signs or injury or distress. Vitals signs obtained will continue to monitor.

## 2015-02-13 ENCOUNTER — Telehealth: Payer: Self-pay | Admitting: *Deleted

## 2015-02-13 NOTE — Telephone Encounter (Signed)
Transition Care Management Follow-up Telephone Call   Date discharged? 02/12/15   How have you been since you were released from the hospital? Doing well, decreased mobility, weak   Do you understand why you were in the hospital? yes   Do you understand the discharge instructions? yes   Where were you discharged to? Home   Items Reviewed:  Medications reviewed: yes  Allergies reviewed: yes  Dietary changes reviewed: no  Referrals reviewed: yes, home health/PT/neurology   Functional Questionnaire:   Activities of Daily Living (ADLs):   He states they are independent in the following: feeding, continence and grooming States they require assistance with the following: ambulation, bathing and hygiene, toileting and dressing   Any transportation issues/concerns?: no   Any patient concerns? no   Confirmed importance and date/time of follow-up visits scheduled yes, 02/18/15 @ 1500  Provider Appointment booked with Renford Dills, MD  Confirmed with patient if condition begins to worsen call PCP or go to the ER.  Patient was given the office number and encouraged to call back with question or concerns.  : yes

## 2015-02-18 ENCOUNTER — Encounter: Payer: Self-pay | Admitting: Family Medicine

## 2015-02-18 ENCOUNTER — Ambulatory Visit (INDEPENDENT_AMBULATORY_CARE_PROVIDER_SITE_OTHER): Payer: Commercial Managed Care - HMO | Admitting: Family Medicine

## 2015-02-18 VITALS — BP 102/62 | HR 76 | Temp 97.6°F

## 2015-02-18 DIAGNOSIS — F0631 Mood disorder due to known physiological condition with depressive features: Secondary | ICD-10-CM | POA: Diagnosis not present

## 2015-02-18 DIAGNOSIS — I638 Other cerebral infarction: Secondary | ICD-10-CM | POA: Diagnosis not present

## 2015-02-18 DIAGNOSIS — I6389 Other cerebral infarction: Secondary | ICD-10-CM

## 2015-02-18 DIAGNOSIS — IMO0002 Reserved for concepts with insufficient information to code with codable children: Secondary | ICD-10-CM

## 2015-02-18 MED ORDER — ESCITALOPRAM OXALATE 5 MG PO TABS: 5.0000 mg | ORAL_TABLET | Freq: Every day | ORAL | Status: DC

## 2015-02-18 NOTE — Progress Notes (Signed)
Pre visit review using our clinic review tool, if applicable. No additional management support is needed unless otherwise documented below in the visit note.  Admit date: 02/09/2015 Discharge date: 02/12/2015  Time spent: 35 minutes  Recommendations for Outpatient Follow-up:  1. [name] was discharged home with home services; PT and RN  2. Please follow-up on blood pressures 3. [name] was given a 2 month follow-up appointment with neurology  Discharge Diagnoses:  Principal Problem:  Acute right MCA stroke ([name]) Active Problems:  Pure hypercholesterolemia  Intertrochanteric fracture of left femur (HCC)  Hypothyroidism  Facial droop  Acute left-sided weakness  Essential hypertension  BPH (benign prostatic hyperplasia)  Lewy body dementia without behavioral disturbance   Discharge Condition: Stable/improved  Diet recommendation: Heart healthy  Filed Weights   02/09/15 1755 02/09/15 2100  Weight: 69.854 kg (154 lb) 70.1 kg (154 lb 8.7 oz)    History of present illness:  [name] is a 81 year old male left-hand dominant with a past medical history significant for TIA, HTN, dementia, hypothyroidism, left hip fracture on 9/22 and subsequent surgery on 9/23; who presents with complaints of left-sided facial droop and weakness. [name]'s history is obtained by the [name]'s wife who is present at bedside. [name] was last seen to be normal around noon and later this afternoon was seen to have some left-sided facial drooping by his son. There was some slurred speech and the [name] was noted to be showing some signs of weakness in trying to transfer and walk. [name] had been receiving physical therapy at home after his previous hip fracture. At baseline [name] was noted to have been able to transfer and able to walk some short distances with a rolling walker. [name] had been unable to use the bathroom or bathe without assistance. Associated symptoms included reports of numbness  of the left side of his face .  Hospital Course:  [name] is a pleasant 81 year old gentleman with a past medical history of advanced dementia, hypertension, recurrent TIAs, having a recent hip fracture on 12/25/2014 admitted to medicine service on 02/09/2015 when [name] presented with complaints of left-sided facial droop and hemiparesis. [name] was initially worked up with a CT scan of brain which did not show acute intracranial abnormality. This is follow-up with an MRI that revealed an acute right MCA territory infarct. [name] was seen and evaluated by neurology. Stroke workup included a transthoracic echocardiogram performed on 02/10/2015 that showed a preserved ejection fraction of 65-70% without wall motion abnormalities and grade 1 diastolic dysfunction. Carotid Dopplers performed on 02/10/2015 showed bilateral 1-39% ICA stenosis. Neurology recommended treating with Plavix any 5 mg by mouth daily. Inpatient rehabilitation have been recommended however family members expressed concerns over at a pocket expense. They expressed desire for him to go home with home health services. Prior to discharge she was set up with home health physical therapy and RN. [name] was discharged in stable condition on 02/12/2015.   Procedures:  Transthoracic echocardiogram performed on 02/10/2015 impression: - Left ventricle: The cavity size was normal. There was moderate focal basal and mild concentric hypertrophy. Systolic function was vigorous. The estimated ejection fraction was in the range of 65% to 70%. Wall motion was normal; there were no regional wall motion abnormalities. Doppler parameters are consistent with abnormal left ventricular relaxation (grade 1 diastolic dysfunction).  Consultations:  Neurology     Hospital f/u.  Above noted and d/w pt and wife.  To ER with L facial and L sided arm and leg weakness.  Started on  plavix and amlodipine.  MR with CVA noted, d/w pt and wife.  Has PT at home  now.  Occ lightheaded.  BP has been ~130-140/80s at home recently.  Not SOB.  No new neuro deficits.  Eating well.  Pain from hip is controlled, rare hydrocodone.   Not pocketing food.  Swallowing well.  Facial weakness is clearly better now.   L hand strength is fair. Sightly weaker on the L leg but some of that Geraghty be from prev hip trouble also.   Using walker with PT at home, short distances.  This is a recent change.  [name] had been able to transfer prev.   [name] has f/u with ortho pending.   [name]/wife has help coming into the home.  They are able to get by as is at home.    Depression.  Noted before, but worse since, CVA.  "I'm not worth anything."  No active SI/HI but irritable and frustrated and angry and depressed.   D/w pt.  [name] had noted it, along with wife.    PMH and SH reviewed  ROS: See HPI, otherwise noncontributory.  Meds, vitals, and allergies reviewed.   GEN: nad, alert and oriented, affect is flat but [name] was able to smile occ when talking with me. HEENT: mucous membranes moist NECK: supple w/o LA CV: rrr. PULM: ctab, no inc wob EXT: no edema L sided facial droop-mild.  CN 2-12 intact o/w. Mild dec in grip and foot strength on L side compared to R side.   Sensation wnl x4

## 2015-02-18 NOTE — Patient Instructions (Signed)
Start taking lexapro 5mg  a day and see if that helps with depression and irritability.  Update me as needed.  It will not usually work immediately- it will take longer to have an effect.  Don't change your meds otherwise.  Take care.  Glad to see you.

## 2015-02-19 DIAGNOSIS — IMO0002 Reserved for concepts with insufficient information to code with codable children: Secondary | ICD-10-CM | POA: Insufficient documentation

## 2015-02-19 NOTE — Assessment & Plan Note (Signed)
Existed prev, but clearly worse after the CVA.  D/w pt and wife.  This is common.  He is at the point of accepting tx, she agrees with treatment. D/w them about routine use of lexapro and typical sx relief timeframe.  Okay for outpatient f/u. They'll update me as needed.

## 2015-02-19 NOTE — Assessment & Plan Note (Signed)
He is improved but not back to baseline.  Would continue plavix/BP meds for now.  Lower BP noted at clinic but hasn't been low at home.  Would continue as is for now.  Okay for outpatient f/u.  If persistently low BPs or lightheaded at home, then they'll update me and we can adjust meds if needed.  He'll have routine f/u with neuro.  I didn't start statin given his low lipids.  I'll defer that to neuro.  In meantime, we are already starting a new med with lexapro- see depression discussion.  App help of all involved.

## 2015-03-03 ENCOUNTER — Telehealth: Payer: Self-pay | Admitting: Family Medicine

## 2015-03-03 NOTE — Telephone Encounter (Signed)
Patient Name: WALDEN Lamm DOB: 1930/06/24 Initial Comment Caller states husband has had vomiting x 3 in the last week Nurse Assessment Nurse: Marcelline Deist, RN, Kermit Balo Date/Time (Eastern Time): 03/03/2015 11:53:17 AM Confirm and document reason for call. If symptomatic, describe symptoms. ---Caller states husband has had vomiting x 3 in the last week. Has had this in past. Has seen GI Dr., etc. Not sure what brings in on. Gets nausea. No other symptoms. Seems to happen at night. Has the patient traveled out of the country within the last 30 days? ---Not Applicable Does the patient have any new or worsening symptoms? ---Yes Will a triage be completed? ---Yes Related visit to physician within the last 2 weeks? ---No Does the PT have any chronic conditions? (i.e. diabetes, asthma, etc.) ---Yes List chronic conditions. ---broke hip in Sept., had a stroke after that, on blood thinner, depression, on BP rx Is this a behavioral health call? ---No Guidelines Guideline Title Affirmed Question Affirmed Notes Vomiting [1] MILD or MODERATE vomiting AND [2] present > 48 hours (2 days) (Exception: mild vomiting with associated diarrhea) Final Disposition User See Physician within [facility], RN, Kermit Balo Comments Please contact caller at this # with additional feedback/advice. The caller hesitated to make appt. , states his Dr. is familiar with this as it has been going on in the past. Would like to know if there is anything else he can do. Referrals REFERRED TO PCP OFFICE Disagree/Comply: Comply

## 2015-03-03 NOTE — Telephone Encounter (Signed)
Wife advised.  Wife says he seems to be doing okay today.  Wife states that the reason she called is that he previously has had these spells every 3 or 4 weeks but that he has had 3 episodes in the past week.

## 2015-03-03 NOTE — Telephone Encounter (Signed)
If this is like prev, it should resolve.  Would try sips of fluids and soft bland foods.   If continuing/persistent or if s/sx dehydration, then needs eval.  Thanks.

## 2015-03-10 NOTE — ED Provider Notes (Signed)
CSN: [id number]     Arrival date & time 02/09/15  1739 History   First MD Initiated Contact with Patient 02/09/15 1741     Chief Complaint  Patient presents with  . Code Stroke     (Consider location/radiation/quality/duration/timing/severity/associated sxs/prior Treatment) HPI  Patient is a 81 year old male left-hand dominant with a past medical history significant for TIA, HTN, dementia, hypothyroidism, left hip fracture on 9/22 and subsequent surgery on 9/23; who presents with complaints of left-sided facial droop and weakness. Patient's history is obtained by the patient's wife who is present at bedside. Patient was last seen to be normal around noon and later this afternoon was seen to have some left-sided facial drooping by his son. There was some slurred speech and the patient was noted to be showing some signs of weakness in trying to transfer and walk. He had been receiving physical therapy at home after his previous hip fracture. At baseline patient was noted to have been able to transfer and able to walk some short distances with a rolling walker. He had been unable to use the bathroom or bathe without assistance.  Past Medical History  Diagnosis Date  . Hypertension   . Hypothyroidism   . Diverticulosis of colon   . BPH (benign prostatic hypertrophy)   . Pancreatitis 2011    and gallstone pancreatitis and Ecoli bacteremia  . 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 followed by [name]- observation as of 09/2010  . Rabbit fever 1940  . History of shingles   . Dementia   . Pneumonia     2016  . Stroke ([place]) 02/2015    with L sided weakness.    Past Surgical History  Procedure Laterality Date  . Appendectomy  1959  . Thyroidectomy, partial  1966  . Knee arthroscopy  06/1995    left Dr Derrel Nip  . Joint replacement  04/30/2001    bilateral TKR  . Cholecystectomy  10/2009  . Eye muscle surgery      12/12 had double vision Dr. Juleen China at [facility]  . Orif  hip fracture Left 12/26/2014    Procedure: OPEN REDUCTION INTERNAL FIXATION HIP;  Surgeon: Paralee Cancel, MD;  Location: [facility];  Service: Orthopedics;  Laterality: Left;   Family History  Problem Relation Age of Onset  . Hypertension Mother   . Stroke Mother   . Alcohol abuse Father   . Cancer Brother     prostate CA  . Heart disease Brother    Social History  Substance Use Topics  . Smoking status: Never Smoker   . Smokeless tobacco: Never Used  . Alcohol Use: No    Review of Systems    Allergies  Sulfonamide derivatives and Valtrex  Home Medications   Prior to Admission medications   Medication Sig Start Date End Date Taking? Authorizing Provider  B Complex-C (B-COMPLEX WITH VITAMIN C) tablet Take 1 tablet by mouth daily. 07/13/12  Yes Ripudeep Krystal Eaton, MD  donepezil (ARICEPT) 10 MG tablet Take 1 tablet (10 mg total) by mouth daily. 12/11/14  Yes Tonia Ghent, MD  HYDROcodone-acetaminophen (NORCO/VICODIN) 5-325 MG per tablet Take 1-2 tablets by mouth every 6 (six) hours as needed for moderate pain. 12/28/14  Yes Amber Constable, PA-C  levothyroxine (SYNTHROID, LEVOTHROID) 100 MCG tablet TAKE 1 TABLET EVERY DAY Patient taking differently: TAKE 100 MCG BY MOUTH DAILY 09/26/14  Yes Tonia Ghent, MD  Multiple Vitamin (MULTIVITAMIN) tablet Take 1 tablet  by mouth daily.     Yes Historical Provider, MD  omeprazole (PRILOSEC) 40 MG capsule Take 40 mg by mouth daily.  12/10/14  Yes Historical Provider, MD  ondansetron (ZOFRAN) 4 MG tablet Take 1 tablet (4 mg total) by mouth every 6 (six) hours as needed for nausea. 12/30/14  Yes Venetia Maxon Rama, MD  polyethylene glycol (MIRALAX / GLYCOLAX) packet Take 17 g by mouth daily. Patient taking differently: Take 17 g by mouth daily as needed.  12/30/14  Yes Venetia Maxon Rama, MD  Probiotic Product (PROBIOTIC PO) Take 1 tablet by mouth daily.   Yes Historical Provider, MD  terazosin (HYTRIN) 5 MG capsule Take 1 capsule (5 mg total) by mouth at  bedtime. 08/11/14  Yes Tonia Ghent, MD  amLODipine (NORVASC) 10 MG tablet Take 1 tablet (10 mg total) by mouth daily. 02/12/15   Kelvin Cellar, MD  clopidogrel (PLAVIX) 75 MG tablet Take 1 tablet (75 mg total) by mouth daily. 02/12/15   Kelvin Cellar, MD  escitalopram (LEXAPRO) 5 MG tablet Take 1 tablet (5 mg total) by mouth daily. 02/18/15   Tonia Ghent, MD  ferrous sulfate 325 (65 FE) MG tablet Take 1 tablet (325 mg total) by mouth 2 (two) times daily with a meal. Patient not taking: Reported on 02/18/2015 12/30/14   Venetia Maxon Rama, MD   BP 137/80 mmHg  Pulse 74  Temp(Src) 98.1 F (36.7 C) (Oral)  Resp 20  Ht 5\' 10"  (1.778 m)  Wt 154 lb 8.7 oz (70.1 kg)  BMI 22.17 kg/m2  SpO2 98% Physical Exam  Constitutional: He appears well-developed and well-nourished. No distress.  HENT:  Head: Normocephalic and atraumatic.  Eyes: Conjunctivae are normal. Right eye exhibits no discharge. Left eye exhibits no discharge.  Neck: Neck supple.  Cardiovascular: Normal rate, regular rhythm and normal heart sounds.  Exam reveals no gallop and no friction rub.   No murmur heard. Pulmonary/Chest: Effort normal and breath sounds normal. No respiratory distress.  Abdominal: Soft. He exhibits no distension. There is no tenderness.  Musculoskeletal: He exhibits no edema or tenderness.  Neurological: He is alert.  l facial droop. 4/5 strength LU/L ext. normal elsewhere. Cn otherwise intact. Sensation intact to light touch.   Skin: Skin is warm and dry.  Psychiatric: He has a normal mood and affect. His behavior is normal. Thought content normal.  Nursing note and vitals reviewed.   ED Course  Procedures (including critical care time) Labs Review Labs Reviewed  COMPREHENSIVE METABOLIC PANEL - Abnormal; Notable for the following:    Glucose, Bld 108 (*)    BUN 31 (*)    Creatinine, Ser 1.26 (*)    GFR calc non Af Amer 51 (*)    GFR calc Af Amer 60 (*)    All other components within normal  limits  URINE RAPID DRUG SCREEN, HOSP PERFORMED - Abnormal; Notable for the following:    Opiates POSITIVE (*)    All other components within normal limits  URINALYSIS, ROUTINE W REFLEX MICROSCOPIC (NOT AT [facility]) - Abnormal; Notable for the following:    APPearance HAZY (*)    Leukocytes, UA SMALL (*)    All other components within normal limits  URINE MICROSCOPIC-ADD ON - Abnormal; Notable for the following:    Squamous Epithelial / LPF FEW (*)    Bacteria, UA FEW (*)    Crystals CA OXALATE CRYSTALS (*)    All other components within normal limits  I-STAT CHEM 8, ED -  Abnormal; Notable for the following:    BUN 35 (*)    Glucose, Bld 100 (*)    All other components within normal limits  ETHANOL  PROTIME-INR  APTT  CBC  DIFFERENTIAL  HEMOGLOBIN A1C  LIPID PANEL  TSH  I-STAT TROPOININ, ED    Imaging Review No results found.   Dg Chest 2 View  02/10/2015  CLINICAL DATA:  Cough and weakness today EXAM: CHEST - 2 VIEW COMPARISON:  02/06/2015 FINDINGS: Cardiac shadow is at the upper limits of normal in size accentuated by the portable technique. Mild left basilar atelectasis is seen. No effusion is noted. No other focal infiltrative density is noted. Bony structures are stable. IMPRESSION: Mild left basilar atelectasis. Electronically Signed   By: Inez Catalina M.D.   On: 02/10/2015 10:03   Ct Head Wo Contrast  02/09/2015  CLINICAL DATA:  Code stroke, left facial droop, slurred speech EXAM: CT HEAD WITHOUT CONTRAST TECHNIQUE: Contiguous axial images were obtained from the base of the skull through the vertex without contrast. COMPARISON:  11/09/2014 FINDINGS: Advanced brain atrophy and chronic white matter microvascular ischemic change throughout the cerebral hemispheres. There is associated ventricular enlargement. No acute intracranial hemorrhage, definite infarction, midline shift, herniation, or extra-axial fluid collection. No focal mass effect or edema. Cisterns are patent.  Cerebellar atrophy as well. Orbits are symmetric. Chronic maxillary sinus disease. Other sinuses and mastoids remain clear. Intact skull. IMPRESSION: Advanced atrophy, chronic small vessel ischemic change, and ventricular enlargement. No interval change or acute finding by noncontrast CT These results were called by telephone at the time of interpretation on 02/09/2015 at 5:53 pm to Dr. Aram Beecham, who verbally acknowledged these results. Electronically Signed   By: Jerilynn Mages.  Shick M.D.   On: 02/09/2015 17:54   Mr Jodene Nam Head Wo Contrast  02/09/2015  CLINICAL DATA:  Patient with dementia developed LEFT facial droop, LEFT face numbness and dysarthria earlier today. EXAM: MRI HEAD WITHOUT CONTRAST MRA HEAD WITHOUT CONTRAST TECHNIQUE: Multiplanar, multiecho pulse sequences of the brain and surrounding structures were obtained without intravenous contrast. Angiographic images of the head were obtained using MRA technique without contrast. COMPARISON:  CT head earlier today. MR head 12/29/2012. MRI and MRA 07/11/2012. FINDINGS: MRI HEAD FINDINGS There is a curvilinear area of restricted diffusion, affecting the superior most insula RIGHT as well as the subcortical and periventricular white matter on the RIGHT, just posterior to the lentiform nucleus, consistent with an acute lenticulostriate infarct. No other areas of restricted diffusion are seen. No acute hemorrhage, intra-axial mass lesion, or extra-axial fluid. There is global atrophy with hydrocephalus ex vacuo. Significant prominence of the cortical sulci and extracerebral CSF spaces and cisterns. Moderately advanced T2 and FLAIR hyperintensities throughout the periventricular and subcortical white matter representing probable chronic microvascular ischemic change. Small foci of chronic hemorrhage are seen in the deep nuclei, particularly the thalamus, and to a lesser degree the cortex and subcortical white matter of the cerebral hemispheres consistent with hypertensive  cerebral vascular disease. Flow voids are maintained. Partial empty sella. No tonsillar herniation. Cervical spondylosis. BILATERAL cataract extraction. Chronic BILATERAL maxillary sinus disease but no sinus or mastoid air fluid level. Compared with 2014, similar appearance except for the acute infarct. MRA HEAD FINDINGS Widely patent and dolichoectatic internal carotid arteries and basilar artery. LEFT vertebral dominant with RIGHT vertebral supplying primarily the PICA. No MCA stenosis affecting the proximal M1 segments. Moderately diseased distal MCA branches, without definite M3 or beyond occlusion. Moderately diseased RIGHT A1 ACA. This is  non worrisome given the robust LEFT A1 ACA. Fetal origin RIGHT PCA. LEFT PCA displays a moderate stenosis of the P2 segment. No intracranial aneurysm. No cerebellar branch occlusion is evident. Similar appearance to prior MRA. IMPRESSION: Acute RIGHT MCA lenticulostriate territory infarct affecting the superior most insula as well as the subcortical and periventricular white matter of the RIGHT hemisphere. This is nonhemorrhagic. Advanced atrophy and small vessel disease. Numerous chronic microbleeds representing sequelae of hypertensive cerebrovascular disease. No proximal vascular occlusion or stenosis of significance. Electronically Signed   By: Staci Righter M.D.   On: 02/09/2015 20:13   Mr Brain Wo Contrast  02/09/2015  CLINICAL DATA:  Patient with dementia developed LEFT facial droop, LEFT face numbness and dysarthria earlier today. EXAM: MRI HEAD WITHOUT CONTRAST MRA HEAD WITHOUT CONTRAST TECHNIQUE: Multiplanar, multiecho pulse sequences of the brain and surrounding structures were obtained without intravenous contrast. Angiographic images of the head were obtained using MRA technique without contrast. COMPARISON:  CT head earlier today. MR head 12/29/2012. MRI and MRA 07/11/2012. FINDINGS: MRI HEAD FINDINGS There is a curvilinear area of restricted diffusion,  affecting the superior most insula RIGHT as well as the subcortical and periventricular white matter on the RIGHT, just posterior to the lentiform nucleus, consistent with an acute lenticulostriate infarct. No other areas of restricted diffusion are seen. No acute hemorrhage, intra-axial mass lesion, or extra-axial fluid. There is global atrophy with hydrocephalus ex vacuo. Significant prominence of the cortical sulci and extracerebral CSF spaces and cisterns. Moderately advanced T2 and FLAIR hyperintensities throughout the periventricular and subcortical white matter representing probable chronic microvascular ischemic change. Small foci of chronic hemorrhage are seen in the deep nuclei, particularly the thalamus, and to a lesser degree the cortex and subcortical white matter of the cerebral hemispheres consistent with hypertensive cerebral vascular disease. Flow voids are maintained. Partial empty sella. No tonsillar herniation. Cervical spondylosis. BILATERAL cataract extraction. Chronic BILATERAL maxillary sinus disease but no sinus or mastoid air fluid level. Compared with 2014, similar appearance except for the acute infarct. MRA HEAD FINDINGS Widely patent and dolichoectatic internal carotid arteries and basilar artery. LEFT vertebral dominant with RIGHT vertebral supplying primarily the PICA. No MCA stenosis affecting the proximal M1 segments. Moderately diseased distal MCA branches, without definite M3 or beyond occlusion. Moderately diseased RIGHT A1 ACA. This is non worrisome given the robust LEFT A1 ACA. Fetal origin RIGHT PCA. LEFT PCA displays a moderate stenosis of the P2 segment. No intracranial aneurysm. No cerebellar branch occlusion is evident. Similar appearance to prior MRA. IMPRESSION: Acute RIGHT MCA lenticulostriate territory infarct affecting the superior most insula as well as the subcortical and periventricular white matter of the RIGHT hemisphere. This is nonhemorrhagic. Advanced atrophy  and small vessel disease. Numerous chronic microbleeds representing sequelae of hypertensive cerebrovascular disease. No proximal vascular occlusion or stenosis of significance. Electronically Signed   By: Staci Righter M.D.   On: 02/09/2015 20:13   I have personally reviewed and evaluated these images and lab results as part of my medical decision-making.   EKG Interpretation   Date/Time:  Monday February 09 2015 17:52:04 EST Ventricular Rate:  74 PR Interval:  161 QRS Duration: 88 QT Interval:  408 QTC Calculation: 453 R Axis:   -17 Text Interpretation:  Sinus rhythm Borderline left axis deviation Low  voltage, extremity leads ED PHYSICIAN INTERPRETATION AVAILABLE IN [facility] Confirmed by TEST, Record ([id number]) on 02/10/2015 6:35:20 AM      MDM   Final diagnoses:  Facial droop  84yM with stroke on mri. Admit for further eval.     Virgel Manifold, MD 03/10/15 [phone]

## 2015-03-14 ENCOUNTER — Encounter (HOSPITAL_COMMUNITY): Payer: Self-pay | Admitting: Emergency Medicine

## 2015-03-14 ENCOUNTER — Emergency Department (HOSPITAL_COMMUNITY): Payer: Commercial Managed Care - HMO

## 2015-03-14 ENCOUNTER — Inpatient Hospital Stay (HOSPITAL_COMMUNITY)
Admission: EM | Admit: 2015-03-14 | Discharge: 2015-03-19 | DRG: 481 | Disposition: A | Discharge status: Home Health | Payer: Commercial Managed Care - HMO | Attending: Internal Medicine | Admitting: Internal Medicine

## 2015-03-14 DIAGNOSIS — E039 Hypothyroidism, unspecified: Secondary | ICD-10-CM | POA: Diagnosis present

## 2015-03-14 DIAGNOSIS — K2270 Barrett's esophagus without dysplasia: Secondary | ICD-10-CM | POA: Diagnosis present

## 2015-03-14 DIAGNOSIS — W19XXXA Unspecified fall, initial encounter: Secondary | ICD-10-CM

## 2015-03-14 DIAGNOSIS — I5189 Other ill-defined heart diseases: Secondary | ICD-10-CM | POA: Diagnosis present

## 2015-03-14 DIAGNOSIS — F419 Anxiety disorder, unspecified: Secondary | ICD-10-CM | POA: Diagnosis present

## 2015-03-14 DIAGNOSIS — I63511 Cerebral infarction due to unspecified occlusion or stenosis of right middle cerebral artery: Secondary | ICD-10-CM | POA: Diagnosis present

## 2015-03-14 DIAGNOSIS — I6523 Occlusion and stenosis of bilateral carotid arteries: Secondary | ICD-10-CM | POA: Diagnosis present

## 2015-03-14 DIAGNOSIS — D62 Acute posthemorrhagic anemia: Secondary | ICD-10-CM | POA: Diagnosis not present

## 2015-03-14 DIAGNOSIS — G3183 Dementia with Lewy bodies: Secondary | ICD-10-CM | POA: Diagnosis present

## 2015-03-14 DIAGNOSIS — K219 Gastro-esophageal reflux disease without esophagitis: Secondary | ICD-10-CM | POA: Diagnosis present

## 2015-03-14 DIAGNOSIS — W06XXXA Fall from bed, initial encounter: Secondary | ICD-10-CM | POA: Diagnosis present

## 2015-03-14 DIAGNOSIS — I5032 Chronic diastolic (congestive) heart failure: Secondary | ICD-10-CM | POA: Diagnosis present

## 2015-03-14 DIAGNOSIS — Z96642 Presence of left artificial hip joint: Secondary | ICD-10-CM | POA: Diagnosis present

## 2015-03-14 DIAGNOSIS — N390 Urinary tract infection, site not specified: Secondary | ICD-10-CM | POA: Diagnosis not present

## 2015-03-14 DIAGNOSIS — N400 Enlarged prostate without lower urinary tract symptoms: Secondary | ICD-10-CM | POA: Diagnosis present

## 2015-03-14 DIAGNOSIS — F0280 Dementia in other diseases classified elsewhere without behavioral disturbance: Secondary | ICD-10-CM | POA: Diagnosis present

## 2015-03-14 DIAGNOSIS — S72142F Displaced intertrochanteric fracture of left femur, subsequent encounter for open fracture type IIIA, IIIB, or IIIC with routine healing: Secondary | ICD-10-CM

## 2015-03-14 DIAGNOSIS — S72492A Other fracture of lower end of left femur, initial encounter for closed fracture: Secondary | ICD-10-CM | POA: Diagnosis present

## 2015-03-14 DIAGNOSIS — Z882 Allergy status to sulfonamides status: Secondary | ICD-10-CM | POA: Diagnosis not present

## 2015-03-14 DIAGNOSIS — K5909 Other constipation: Secondary | ICD-10-CM | POA: Diagnosis present

## 2015-03-14 DIAGNOSIS — K5730 Diverticulosis of large intestine without perforation or abscess without bleeding: Secondary | ICD-10-CM | POA: Diagnosis present

## 2015-03-14 DIAGNOSIS — Z419 Encounter for procedure for purposes other than remedying health state, unspecified: Secondary | ICD-10-CM

## 2015-03-14 DIAGNOSIS — S72142A Displaced intertrochanteric fracture of left femur, initial encounter for closed fracture: Secondary | ICD-10-CM | POA: Diagnosis not present

## 2015-03-14 DIAGNOSIS — I639 Cerebral infarction, unspecified: Secondary | ICD-10-CM | POA: Diagnosis not present

## 2015-03-14 DIAGNOSIS — S52372A Galeazzi's fracture of left radius, initial encounter for closed fracture: Secondary | ICD-10-CM | POA: Diagnosis present

## 2015-03-14 DIAGNOSIS — IMO0002 Reserved for concepts with insufficient information to code with codable children: Secondary | ICD-10-CM | POA: Diagnosis present

## 2015-03-14 DIAGNOSIS — I519 Heart disease, unspecified: Secondary | ICD-10-CM | POA: Diagnosis not present

## 2015-03-14 DIAGNOSIS — S72142D Displaced intertrochanteric fracture of left femur, subsequent encounter for closed fracture with routine healing: Secondary | ICD-10-CM | POA: Diagnosis not present

## 2015-03-14 DIAGNOSIS — I10 Essential (primary) hypertension: Secondary | ICD-10-CM

## 2015-03-14 DIAGNOSIS — I110 Hypertensive heart disease with heart failure: Secondary | ICD-10-CM | POA: Diagnosis present

## 2015-03-14 DIAGNOSIS — M9702XA Periprosthetic fracture around internal prosthetic left hip joint, initial encounter: Secondary | ICD-10-CM | POA: Diagnosis present

## 2015-03-14 DIAGNOSIS — Z888 Allergy status to other drugs, medicaments and biological substances status: Secondary | ICD-10-CM

## 2015-03-14 DIAGNOSIS — Z96653 Presence of artificial knee joint, bilateral: Secondary | ICD-10-CM | POA: Diagnosis present

## 2015-03-14 DIAGNOSIS — K5900 Constipation, unspecified: Secondary | ICD-10-CM | POA: Diagnosis not present

## 2015-03-14 DIAGNOSIS — I69354 Hemiplegia and hemiparesis following cerebral infarction affecting left non-dominant side: Secondary | ICD-10-CM

## 2015-03-14 DIAGNOSIS — I739 Peripheral vascular disease, unspecified: Secondary | ICD-10-CM | POA: Diagnosis present

## 2015-03-14 DIAGNOSIS — T1490XA Injury, unspecified, initial encounter: Secondary | ICD-10-CM

## 2015-03-14 DIAGNOSIS — S5292XA Unspecified fracture of left forearm, initial encounter for closed fracture: Secondary | ICD-10-CM | POA: Diagnosis present

## 2015-03-14 DIAGNOSIS — D72829 Elevated white blood cell count, unspecified: Secondary | ICD-10-CM | POA: Diagnosis not present

## 2015-03-14 DIAGNOSIS — F329 Major depressive disorder, single episode, unspecified: Secondary | ICD-10-CM | POA: Diagnosis present

## 2015-03-14 DIAGNOSIS — Z7902 Long term (current) use of antithrombotics/antiplatelets: Secondary | ICD-10-CM | POA: Diagnosis not present

## 2015-03-14 DIAGNOSIS — M25552 Pain in left hip: Secondary | ICD-10-CM | POA: Diagnosis present

## 2015-03-14 DIAGNOSIS — S5290XA Unspecified fracture of unspecified forearm, initial encounter for closed fracture: Secondary | ICD-10-CM

## 2015-03-14 LAB — URINALYSIS, ROUTINE W REFLEX MICROSCOPIC
BILIRUBIN URINE: NEGATIVE
GLUCOSE, UA: NEGATIVE mg/dL
KETONES UR: 15 mg/dL — AB
NITRITE: POSITIVE — AB
PH: 5.5 (ref 5.0–8.0)
Protein, ur: NEGATIVE mg/dL
Specific Gravity, Urine: 1.022 (ref 1.005–1.030)

## 2015-03-14 LAB — COMPREHENSIVE METABOLIC PANEL
ALT: 12 U/L — ABNORMAL LOW (ref 17–63)
ANION GAP: 8 (ref 5–15)
AST: 21 U/L (ref 15–41)
Albumin: 3.5 g/dL (ref 3.5–5.0)
Alkaline Phosphatase: 85 U/L (ref 38–126)
BILIRUBIN TOTAL: 0.4 mg/dL (ref 0.3–1.2)
BUN: 20 mg/dL (ref 6–20)
CHLORIDE: 105 mmol/L (ref 101–111)
CO2: 27 mmol/L (ref 22–32)
Calcium: 9 mg/dL (ref 8.9–10.3)
Creatinine, Ser: 1.03 mg/dL (ref 0.61–1.24)
Glucose, Bld: 131 mg/dL — ABNORMAL HIGH (ref 65–99)
POTASSIUM: 3.8 mmol/L (ref 3.5–5.1)
Sodium: 140 mmol/L (ref 135–145)
TOTAL PROTEIN: 6.1 g/dL — AB (ref 6.5–8.1)

## 2015-03-14 LAB — CBC WITH DIFFERENTIAL/PLATELET
Basophils Absolute: 0 10*3/uL (ref 0.0–0.1)
Basophils Relative: 0 %
EOS PCT: 0 %
Eosinophils Absolute: 0 10*3/uL (ref 0.0–0.7)
HEMATOCRIT: 41.6 % (ref 39.0–52.0)
Hemoglobin: 13.5 g/dL (ref 13.0–17.0)
LYMPHS PCT: 4 %
Lymphs Abs: 0.6 10*3/uL — ABNORMAL LOW (ref 0.7–4.0)
MCH: 27.7 pg (ref 26.0–34.0)
MCHC: 32.5 g/dL (ref 30.0–36.0)
MCV: 85.4 fL (ref 78.0–100.0)
MONO ABS: 0.4 10*3/uL (ref 0.1–1.0)
MONOS PCT: 3 %
NEUTROS ABS: 13.3 10*3/uL — AB (ref 1.7–7.7)
Neutrophils Relative %: 93 %
PLATELETS: 224 10*3/uL (ref 150–400)
RBC: 4.87 MIL/uL (ref 4.22–5.81)
RDW: 13.4 % (ref 11.5–15.5)
WBC: 14.3 10*3/uL — ABNORMAL HIGH (ref 4.0–10.5)

## 2015-03-14 LAB — PROTIME-INR
INR: 1.1 (ref 0.00–1.49)
PROTHROMBIN TIME: 14.4 s (ref 11.6–15.2)

## 2015-03-14 LAB — URINE MICROSCOPIC-ADD ON

## 2015-03-14 LAB — TSH: TSH: 0.287 u[IU]/mL — ABNORMAL LOW (ref 0.350–4.500)

## 2015-03-14 MED ORDER — FLEET ENEMA 7-19 GM/118ML RE ENEM: 1.0000 | ENEMA | Freq: Once | RECTAL | Status: DC | PRN

## 2015-03-14 MED ORDER — SODIUM CHLORIDE 0.9 % IV SOLN: INTRAVENOUS | Status: AC

## 2015-03-14 MED ORDER — ACETAMINOPHEN 650 MG RE SUPP: 650.0000 mg | Freq: Four times a day (QID) | RECTAL | Status: DC | PRN

## 2015-03-14 MED ORDER — ONDANSETRON HCL 4 MG PO TABS: 4.0000 mg | ORAL_TABLET | Freq: Four times a day (QID) | ORAL | Status: DC | PRN

## 2015-03-14 MED ORDER — MORPHINE SULFATE (PF) 2 MG/ML IV SOLN
2.0000 mg | Freq: Once | INTRAVENOUS | Status: AC
  Administered 2015-03-14: 2 mg via INTRAVENOUS
  Filled 2015-03-14: qty 1

## 2015-03-14 MED ORDER — ONDANSETRON HCL 4 MG/2ML IJ SOLN
4.0000 mg | Freq: Four times a day (QID) | INTRAMUSCULAR | Status: DC | PRN
  Administered 2015-03-14: 4 mg via INTRAVENOUS
  Filled 2015-03-14: qty 2

## 2015-03-14 MED ORDER — HYDROMORPHONE HCL 1 MG/ML IJ SOLN
0.5000 mg | INTRAMUSCULAR | Status: DC | PRN
  Administered 2015-03-14 – 2015-03-16 (×9): 0.5 mg via INTRAVENOUS
  Filled 2015-03-14 (×10): qty 1

## 2015-03-14 MED ORDER — ACETAMINOPHEN 325 MG PO TABS: 650.0000 mg | ORAL_TABLET | Freq: Four times a day (QID) | ORAL | Status: DC | PRN

## 2015-03-14 MED ORDER — SODIUM CHLORIDE 0.9 % IV SOLN: INTRAVENOUS | Status: DC

## 2015-03-14 MED ORDER — MAGNESIUM HYDROXIDE 400 MG/5ML PO SUSP: 30.0000 mL | Freq: Every day | ORAL | Status: DC | PRN

## 2015-03-14 MED ORDER — ENOXAPARIN SODIUM 40 MG/0.4ML ~~LOC~~ SOLN
40.0000 mg | SUBCUTANEOUS | Status: DC
  Administered 2015-03-14 – 2015-03-15 (×2): 40 mg via SUBCUTANEOUS
  Filled 2015-03-14 (×2): qty 0.4

## 2015-03-14 MED ORDER — CLOPIDOGREL BISULFATE 75 MG PO TABS
75.0000 mg | ORAL_TABLET | Freq: Every day | ORAL | Status: DC
  Administered 2015-03-15: 75 mg via ORAL
  Filled 2015-03-14 (×2): qty 1

## 2015-03-14 MED ORDER — PANTOPRAZOLE SODIUM 40 MG IV SOLR
40.0000 mg | INTRAVENOUS | Status: DC
  Administered 2015-03-15: 40 mg via INTRAVENOUS
  Filled 2015-03-14: qty 40

## 2015-03-14 NOTE — ED Notes (Signed)
Received pt from home with c/o slip and fall while getting out of bed. Pt has deformity to left wrist, c/o left knee and hip pain. Pt given 150 mcg fentanyl and 4 mg of zofran.

## 2015-03-14 NOTE — Progress Notes (Signed)
Orthopedic Tech Progress Note Patient Details:  [name] 03/24/1931 [id number]  Ortho Devices Type of Ortho Device: Ace wrap, Sugartong splint Ortho Device/Splint Location: lue Ortho Device/Splint Interventions: Application Sling order to be cancelled; RN; notified  [name] 103/22/202016, 11:19 AM

## 2015-03-14 NOTE — ED Notes (Signed)
Ortho tech returned call and will come see patient in ED

## 2015-03-14 NOTE — ED Notes (Signed)
Patient and patient's wife given warm blankets

## 2015-03-14 NOTE — Consult Note (Signed)
Reason for Consult:left wrist and left thigh pain after fall Referring Physician: [facility]  HPI: [name] is an 81 y.o. male with past medical history that includes stroke last month with moderate left-sided hemiparesis, BPH, hypertension, hypothyroidism, bilateral knee replacements ORIF of hip in September of this year presents to the emergency department after a fall with chief complaint of left arm/wrist pain left leg pain. Initial evaluation in the emergency room department reveals distal femur fracture on left and left distal radial fracture. Dr. Alvan Dame performed ORIF left hip in September of this year  Past Medical History  Diagnosis Date  . Hypertension   . Hypothyroidism   . Diverticulosis of colon   . BPH (benign prostatic hypertrophy)   . Pancreatitis 2011    and gallstone pancreatitis and Ecoli bacteremia  . 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 followed by [name]- observation as of 09/2010  . Rabbit fever 1940  . History of shingles   . Dementia   . Pneumonia     2016  . Stroke ([place]) 02/2015    with L sided weakness.     Past Surgical History  Procedure Laterality Date  . Appendectomy  1959  . Thyroidectomy, partial  1966  . Knee arthroscopy  06/1995    left Dr Derrel Nip  . Joint replacement  04/30/2001    bilateral TKR  . Cholecystectomy  10/2009  . Eye muscle surgery      12/12 had double vision Dr[facility] Juleen China [facility]  . Orif hip fracture Left 12/26/2014    Procedure: OPEN REDUCTION INTERNAL FIXATION HIP;  Surgeon: Paralee Cancel, MD;  Location: [facility];  Service: Orthopedics;  Laterality: Left;    Family History  Problem Relation Age of Onset  . Hypertension Mother   . Stroke Mother   . Alcohol abuse Father   . Cancer Brother     prostate CA  . Heart disease Brother     Social History:  reports that he has never smoked. He has never used smokeless tobacco. He reports that he does not drink alcohol or use illicit  drugs.  Allergies:  Allergies  Allergen Reactions  . Sulfonamide Derivatives Other (See Comments)    Childhood allergy  . Valtrex [Valacyclovir Hcl]     NAV    Medications: I have reviewed the patient's current medications.  Results for orders placed or performed during the hospital encounter of 03/14/15 (from the past 48 hour(s))  CBC with Differential/Platelet     Status: Abnormal   Collection Time: 03/14/15 10:51 AM  Result Value Ref Range   WBC 14.3 (H) 4.0 - 10.5 K/uL   RBC 4.87 4.22 - 5.81 MIL/uL   Hemoglobin 13.5 13.0 - 17.0 g/dL   HCT 41.6 39.0 - 52.0 %   MCV 85.4 78.0 - 100.0 fL   MCH 27.7 26.0 - 34.0 pg   MCHC 32.5 30.0 - 36.0 g/dL   RDW 13.4 11.5 - 15.5 %   Platelets 224 150 - 400 K/uL   Neutrophils Relative % 93 %   Neutro Abs 13.3 (H) 1.7 - 7.7 K/uL   Lymphocytes Relative 4 %   Lymphs Abs 0.6 (L) 0.7 - 4.0 K/uL   Monocytes Relative 3 %   Monocytes Absolute 0.4 0.1 - 1.0 K/uL   Eosinophils Relative 0 %   Eosinophils Absolute 0.0 0.0 - 0.7 K/uL   Basophils Relative 0 %   Basophils Absolute 0.0 0.0 - 0.1  K/uL  Comprehensive metabolic panel     Status: Abnormal   Collection Time: 03/14/15 10:51 AM  Result Value Ref Range   Sodium 140 135 - 145 mmol/L   Potassium 3.8 3.5 - 5.1 mmol/L   Chloride 105 101 - 111 mmol/L   CO2 27 22 - 32 mmol/L   Glucose, Bld 131 (H) 65 - 99 mg/dL   BUN 20 6 - 20 mg/dL   Creatinine, Ser 1.03 0.61 - 1.24 mg/dL   Calcium 9.0 8.9 - 10.3 mg/dL   Total Protein 6.1 (L) 6.5 - 8.1 g/dL   Albumin 3.5 3.5 - 5.0 g/dL   AST 21 15 - 41 U/L   ALT 12 (L) 17 - 63 U/L   Alkaline Phosphatase 85 38 - 126 U/L   Total Bilirubin 0.4 0.3 - 1.2 mg/dL   GFR calc non Af Amer >60 >60 mL/min   GFR calc Af Amer >60 >60 mL/min    Comment: (NOTE) The eGFR has been calculated using the CKD EPI equation. This calculation has not been validated in all clinical situations. eGFR's persistently <60 mL/min signify possible Chronic Kidney Disease.    Anion  gap 8 5 - 15  Urinalysis, Routine w reflex microscopic (not at [facility])     Status: Abnormal   Collection Time: 03/14/15  2:00 PM  Result Value Ref Range   Color, Urine AMBER (A) YELLOW    Comment: BIOCHEMICALS I[place]engar BE AFFECTED BY COLOR   APPearance CLOUDY (A) CLEAR   Specific Gravity, Urine 1.022 1.005 - 1.030   pH 5.5 5.0 - 8.0   Glucose, UA NEGATIVE NEGATIVE mg/dL   Hgb urine dipstick TRACE (A) NEGATIVE   Bilirubin Urine NEGATIVE NEGATIVE   Ketones, ur 15 (A) NEGATIVE mg/dL   Protein, ur NEGATIVE NEGATIVE mg/dL   Nitrite POSITIVE (A) NEGATIVE   Leukocytes, UA SMALL (A) NEGATIVE  Urine microscopic-add on     Status: Abnormal   Collection Time: 03/14/15  2:00 PM  Result Value Ref Range   Squamous Epithelial / LPF 0-5 (A) NONE SEEN   WBC, UA 6-30 0 - 5 WBC/hpf   RBC / HPF 6-30 0 - 5 RBC/hpf   Bacteria, UA FEW (A) NONE SEEN   Crystals CA OXALATE CRYSTALS (A) NEGATIVE    Dg Ribs Unilateral W/chest Left  12020/12/214  CLINICAL DATA:  Pain following fall EXAM: LEFT RIBS AND CHEST - 3+ VIEW COMPARISON:  Chest radiograph February 10, 2015 FINDINGS: Frontal chest as well as cone-down upper and cone-down lower rib images were obtained. There is mild left base atelectasis. Lungs elsewhere are clear. Heart size and pulmonary vascular normal. No adenopathy. No pneumothorax or effusion. No rib fracture identified. IMPRESSION: No demonstrable rib fracture. No pneumothorax. Mild left base atelectasis. Lungs elsewhere clear. Electronically Signed   By: Lowella Grip III M.D.   On: 12020/12/214 10:11   Dg Wrist Complete Left  12020/12/214  CLINICAL DATA:  Pt reports falling out of bed this AM; obvious deformity to left wrist; left arm was in a splint; best obtainable images due to pt's condition EXAM: LEFT WRIST - COMPLETE 3+ VIEW COMPARISON:  None. FINDINGS: There is oblique fracture through the distal LEFT radial metaphysis. The fracture does not enter the articular surface. There is ventral angulation of  the distal fracture fragment. The radiocarpal joint is intact. There is a fracture of the ulnar styloid which is age indeterminate. Favor acute IMPRESSION: 1. Minimally comminuted Fracture of the distal LEFT radial metaphysis with  ventral angulation. 2. Ulnar styloid fracture. Electronically Signed   By: Suzy Bouchard M.D.   On: 1Jun 25, 202016 09:58   Dg Knee 1-2 Views Left  1Jun 25, 202016  CLINICAL DATA:  Pain following fall EXAM: LEFT KNEE - 1-2 VIEW COMPARISON:  December 25, 2014 FINDINGS: There is a spiral type fracture of the distal fibular diaphysis with medial and posterior displacement of the distal fracture fragment with respect to the more proximal fragment. There is a total knee replacement with femoral and tibial prosthetic components appearing well-seated. No knee joint effusion. There is a moderate knee joint effusion. IMPRESSION: Spiral fracture distal femoral diaphysis, proximal to the total knee prosthesis. There is posterior and medial displacement of the distal fracture fragment with respect to the proximal fragment. No dislocation. There is a joint effusion. Total knee replaced prosthetic components appear well seated. Electronically Signed   By: Lowella Grip III M.D.   On: 1Jun 25, 202016 10:09   Dg Hip Unilat With Pelvis 2-3 Views Left  1Jun 25, 202016  CLINICAL DATA:  Pain following fall EXAM: DG HIP (WITH OR WITHOUT PELVIS) 2-3V LEFT COMPARISON:  Left hip images December 26, 2014 FINDINGS: Frontal pelvis and attempted lateral left hip images obtained. There is postoperative change in the proximal femur with evidence of prior avulsion of the lesser trochanter, stable. Tip of the screw is in the proximal femoral head. No acute fracture or dislocation. There is mild narrowing of both hip joints. IMPRESSION: Postoperative change on the left. Chronic avulsion of the lesser trochanter on the left. Mild narrowing both hip joints. No demonstrable acute fracture or dislocation. Electronically Signed    By: Lowella Grip III M.D.   On: 1Jun 25, 202016 10:00   Dg Femur Min 2 Views Left  1Jun 25, 202016  CLINICAL DATA:  Post fall from bed this morning now with left leg pain. Unable to move left leg. EXAM: LEFT FEMUR 2 VIEWS COMPARISON:  Left knee and hip radiographs - earlier same day, left hip radiographs - 12/25/2014; left knee radiographs - 11/29/2014; intraoperative radiographs of the left hip - 12/26/2014 FINDINGS: Post intra medullary rod and dynamic screw fixation of previously noted comminuted left intertrochanteric femur fracture. No definite evidence of hardware failure or loosening. Persistently displaced intratrochanteric fracture fragments are grossly unchanged compared to intraoperative radiograph performed 12/26/2014. There is an acute minimally displaced obliquely oriented fracture involving the distal femoral metadiaphysis with minimal foreshortening but without definite extension to the cranial aspect of the femoral component of the knee prosthesis. IMPRESSION: 1. Acute obliquely oriented minimally displaced fracture involving the distal femoral meta diaphysis with minimal foreshortening but without definitive extension to involve the femoral component of the knee prosthesis. Correlation with dedicated knee radiographs performed earlier same day is recommended. 2. Stable sequela of intra medullary rod and dynamic screw fixation of previously noted comminuted left intertrochanteric femur fracture without definite evidence of hardware failure or loosening or definitive acute superimposed injury / fracture. Electronically Signed   By: Sandi Mariscal M.D.   On: 1Jun 25, 202016 10:12     Vitals Temp:  [97.6 F (36.4 C)-98 F (36.7 C)] 98 F (36.7 C) (12/10 1315) Pulse Rate:  [80-88] 87 (12/10 1315) Resp:  [8-31] 16 (12/10 1315) BP: (113-156)/(77-95) 156/95 mmHg (12/10 1315) SpO2:  [90 %-98 %] 98 % (12/10 1315) There is no weight on file to calculate BMI.  Physical Exam: elderly WM, HOH, LUE  splinted, limited digital motion(stroke vs pain). RUE and RLE no gross bone or joint instablility. LLE thigh diffusely tender, limited ankle  ROM (stroke/pain), skin intact  Xray: L wrist with comminuted and moderately angulated distal radial metaphyseal fracture, sugar tong applied since initial films  Left femur with long oblique distal femoral metaphyseal fracture above TKA and below IMHS     Assessment/Impression: 1 L distal radial metaphyseal fracture 2 interprosthetic left femoral fracture 3 Recent CVA with L hemiparesis  Treatment plan: I have discussed with the patient and family treatment options and risks vs benefits thereof. From a pain control and bed mobility standpoint anticipate ORIF of femur fracture would be of benefit, and will discuss with Dr. Alvan Dame who has an expertise in these complex periarticular fractures. regarding the wrist fracture, will obtain repeat xrays and make further decisions at that time  [facility] M Nikolina Simerson 108/01/202016, 4:59 PM  Contact # [phone]

## 2015-03-14 NOTE — ED Notes (Signed)
Patient undressed, in gown, on monitor, continuous pulse oximetry and blood pressure cuff; visitor at bedside 

## 2015-03-14 NOTE — ED Notes (Signed)
Ortho tech paged; awaiting return call 

## 2015-03-14 NOTE — H&P (Signed)
[facility] History and Physical  [name] [id number] DOB: 06-Mar-1931 DOA: 12020/05/2614  Referring physician: Zenia Resides PCP: Elsie Stain, MD   Chief Complaint: left arm pain/wall/wrist injury/hip pain/knee pain  HPI: [name] is a very pleasant 81 y.o. male past medical history that includes stroke last month with moderate left-sided hemiparesis, BPH, hypertension, hypothyroidism, bilateral knee replacements ORIF of hip in September of this shear presents to the emergency department after a fall with chief complaint of left arm/wrist pain left leg pain. Initial evaluation in the emergency room department reveals distal femur fracture on left and left distal radial fracture.  Information obtained from the wife and the daughter who is at the bedside. Reportedly patient "rolled slashed slipped" while getting out of bed. Patient does have left-sided weakness and uses cane/walker for ambulation since his stroke in September and wife feels certain patient attempting to get out of bed losses balance and fell. There was no reported loss of consciousness. Patient denies any chest pain palpitation dizziness visual disturbances. Wife reports no complaints of orthostatic hypotension but he does have a history of vertigo. He has been stating and home health PT OT has been eating and drinking his normal amount no recent fever chills or sick contacts. No dysuria hematuria frequency or urgency no diarrhea  In the emergency department he's afebrile hemodynamically stable and not hypoxic. He is provided with 2 mg of morphine and orthopedic tach note indicates Ace wrap was sugar tongs splint applied to the left wrist Review of Systems:  10 point review of systems complete and all systems are negative except as indicated in the history of present illness Past Medical History  Diagnosis Date  . Hypertension   . Hypothyroidism   . Diverticulosis of colon   . BPH (benign prostatic hypertrophy)   .  Pancreatitis 2011    and gallstone pancreatitis and Ecoli bacteremia  . 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 followed by [name]- observation as of 09/2010  . Rabbit fever 1940  . History of shingles   . Dementia   . Pneumonia     2016  . Stroke ([place]) 02/2015    with L sided weakness.    Past Surgical History  Procedure Laterality Date  . Appendectomy  1959  . Thyroidectomy, partial  1966  . Knee arthroscopy  06/1995    left Dr Derrel Nip  . Joint replacement  04/30/2001    bilateral TKR  . Cholecystectomy  10/2009  . Eye muscle surgery      12/12 had double vision Dr. Juleen China at [facility]  . Orif hip fracture Left 12/26/2014    Procedure: OPEN REDUCTION INTERNAL FIXATION HIP;  Surgeon: Paralee Cancel, MD;  Location: [facility];  Service: Orthopedics;  Laterality: Left;   Social History:  reports that he has never smoked. He has never used smokeless tobacco. He reports that he does not drink alcohol or use illicit drugs. Patient lives at home with his wife and has done so for decades. He is a retired Engineer, building services. Uses a cane and/or walker for regulation has moderate-to-severe left hemiparesis pertussis plating and home health physical therapy occupational therapy Allergies  Allergen Reactions  . Sulfonamide Derivatives Other (See Comments)    Childhood allergy  . Valtrex [Valacyclovir Hcl]     NAV    Family History  Problem Relation Age of Onset  . Hypertension Mother   . Stroke Mother   . Alcohol abuse Father   .  Cancer Brother     prostate CA  . Heart disease Brother      Prior to Admission medications   Medication Sig Start Date End Date Taking? Authorizing Provider  amLODipine (NORVASC) 10 MG tablet Take 1 tablet (10 mg total) by mouth daily. 02/12/15  Yes Kelvin Cellar, MD  B Complex-C (B-COMPLEX WITH VITAMIN C) tablet Take 1 tablet by mouth daily. 07/13/12  Yes Ripudeep Krystal Eaton, MD  clopidogrel (PLAVIX) 75 MG tablet Take 1 tablet (75 mg total) by mouth  daily. 02/12/15  Yes Kelvin Cellar, MD  donepezil (ARICEPT) 10 MG tablet Take 1 tablet (10 mg total) by mouth daily. 12/11/14  Yes Tonia Ghent, MD  escitalopram (LEXAPRO) 5 MG tablet Take 1 tablet (5 mg total) by mouth daily. 02/18/15  Yes Tonia Ghent, MD  HYDROcodone-acetaminophen (NORCO/VICODIN) 5-325 MG per tablet Take 1-2 tablets by mouth every 6 (six) hours as needed for moderate pain. 12/28/14  Yes Amber Constable, PA-C  levothyroxine (SYNTHROID, LEVOTHROID) 100 MCG tablet TAKE 1 TABLET EVERY DAY Patient taking differently: TAKE 100 MCG BY MOUTH DAILY 09/26/14  Yes Tonia Ghent, MD  Multiple Vitamin (MULTIVITAMIN) tablet Take 1 tablet by mouth daily.     Yes Historical Provider, MD  omeprazole (PRILOSEC) 40 MG capsule Take 40 mg by mouth daily.  12/10/14  Yes Historical Provider, MD  ondansetron (ZOFRAN) 4 MG tablet Take 1 tablet (4 mg total) by mouth every 6 (six) hours as needed for nausea. 12/30/14  Yes Venetia Maxon Rama, MD  polyethylene glycol (MIRALAX / GLYCOLAX) packet Take 17 g by mouth daily. Patient taking differently: Take 17 g by mouth daily as needed.  12/30/14  Yes Venetia Maxon Rama, MD  Probiotic Product (PROBIOTIC PO) Take 1 tablet by mouth daily.   Yes Historical Provider, MD  terazosin (HYTRIN) 5 MG capsule Take 1 capsule (5 mg total) by mouth at bedtime. 08/11/14  Yes Tonia Ghent, MD  ferrous sulfate 325 (65 FE) MG tablet Take 1 tablet (325 mg total) by mouth 2 (two) times daily with a meal. Patient not taking: Reported on 02/18/2015 12/30/14   Venetia Maxon Rama, MD   Physical Exam: Filed Vitals:   03/14/15 1030 03/14/15 1100 03/14/15 1200 03/14/15 1215  BP: 113/77 118/80 117/77   Pulse: 83 86 86 82  Temp:      TempSrc:      Resp: 8 13 16 14   SpO2: 96% 93% 90% 96%    Wt Readings from Last 3 Encounters:  02/09/15 70.1 kg (154 lb 8.7 oz)  02/06/15 68.266 kg (150 lb 8 oz)  12/25/14 68.04 kg (150 lb)    General:  Appears calm and comfortable, slightly  lethargic Eyes: PERRL, normal lids, irises & conjunctiva ENT: grossly normal hearing, lips & tongue, his membranes of his mouth are pink slightly dry Neck: no LAD, masses or thyromegaly Cardiovascular: RRR, no m/r/g. No LE edema.  Telemetry: SR, no arrhythmias  Respiratory: CTA bilaterally, no w/r/r. Normal respiratory effort. Abdomen: soft, ntnd Skin: no rash or induration seen on limited exam Musculoskeletal: Left arm with short arm cast fingers warm to touch left leg with external rotation slightly short decreased range of motion due to pain toes warm pedal pulses present Psychiatric: grossly normal mood and affect, speech fluent and appropriate Neurologic: Speech slow but clear somewhat lethargic follows commands able to make once and needs known           Labs on Admission:  Basic Metabolic  Panel:  Recent Labs Lab 03/14/15 1051  NA 140  K 3.8  CL 105  CO2 27  GLUCOSE 131*  BUN 20  CREATININE 1.03  CALCIUM 9.0   Liver Function Tests:  Recent Labs Lab 03/14/15 1051  AST 21  ALT 12*  ALKPHOS 85  BILITOT 0.4  PROT 6.1*  ALBUMIN 3.5   No results for input(s): LIPASE, AMYLASE in the last 168 hours. No results for input(s): AMMONIA in the last 168 hours. CBC:  Recent Labs Lab 03/14/15 1051  WBC 14.3*  NEUTROABS 13.3*  HGB 13.5  HCT 41.6  MCV 85.4  PLT 224   Cardiac Enzymes: No results for input(s): CKTOTAL, CKMB, CKMBINDEX, TROPONINI in the last 168 hours.  BNP (last 3 results) No results for input(s): BNP in the last 8760 hours.  ProBNP (last 3 results) No results for input(s): PROBNP in the last 8760 hours.   Creatinine clearance cannot be calculated (Unknown ideal weight.)  CBG: No results for input(s): GLUCAP in the last 168 hours.  Radiological Exams on Admission: Dg Ribs Unilateral W/chest Left  12020-03-2615  CLINICAL DATA:  Pain following fall EXAM: LEFT RIBS AND CHEST - 3+ VIEW COMPARISON:  Chest radiograph February 10, 2015 FINDINGS:  Frontal chest as well as cone-down upper and cone-down lower rib images were obtained. There is mild left base atelectasis. Lungs elsewhere are clear. Heart size and pulmonary vascular normal. No adenopathy. No pneumothorax or effusion. No rib fracture identified. IMPRESSION: No demonstrable rib fracture. No pneumothorax. Mild left base atelectasis. Lungs elsewhere clear. Electronically Signed   By: Lowella Grip III M.D.   On: 12020-03-2615 10:11   Dg Wrist Complete Left  12020-03-2615  CLINICAL DATA:  Pt reports falling out of bed this AM; obvious deformity to left wrist; left arm was in a splint; best obtainable images due to pt's condition EXAM: LEFT WRIST - COMPLETE 3+ VIEW COMPARISON:  None. FINDINGS: There is oblique fracture through the distal LEFT radial metaphysis. The fracture does not enter the articular surface. There is ventral angulation of the distal fracture fragment. The radiocarpal joint is intact. There is a fracture of the ulnar styloid which is age indeterminate. Favor acute IMPRESSION: 1. Minimally comminuted Fracture of the distal LEFT radial metaphysis with ventral angulation. 2. Ulnar styloid fracture. Electronically Signed   By: Suzy Bouchard M.D.   On: 12020-03-2615 09:58   Dg Knee 1-2 Views Left  12020-03-2615  CLINICAL DATA:  Pain following fall EXAM: LEFT KNEE - 1-2 VIEW COMPARISON:  December 25, 2014 FINDINGS: There is a spiral type fracture of the distal fibular diaphysis with medial and posterior displacement of the distal fracture fragment with respect to the more proximal fragment. There is a total knee replacement with femoral and tibial prosthetic components appearing well-seated. No knee joint effusion. There is a moderate knee joint effusion. IMPRESSION: Spiral fracture distal femoral diaphysis, proximal to the total knee prosthesis. There is posterior and medial displacement of the distal fracture fragment with respect to the proximal fragment. No dislocation. There is a  joint effusion. Total knee replaced prosthetic components appear well seated. Electronically Signed   By: Lowella Grip III M.D.   On: 12020-03-2615 10:09   Dg Hip Unilat With Pelvis 2-3 Views Left  12020-03-2615  CLINICAL DATA:  Pain following fall EXAM: DG HIP (WITH OR WITHOUT PELVIS) 2-3V LEFT COMPARISON:  Left hip images December 26, 2014 FINDINGS: Frontal pelvis and attempted lateral left hip images obtained. There is postoperative change  in the proximal femur with evidence of prior avulsion of the lesser trochanter, stable. Tip of the screw is in the proximal femoral head. No acute fracture or dislocation. There is mild narrowing of both hip joints. IMPRESSION: Postoperative change on the left. Chronic avulsion of the lesser trochanter on the left. Mild narrowing both hip joints. No demonstrable acute fracture or dislocation. Electronically Signed   By: Lowella Grip III M.D.   On: 1Jul 29, 202016 10:00   Dg Femur Min 2 Views Left  1Jul 29, 202016  CLINICAL DATA:  Post fall from bed this morning now with left leg pain. Unable to move left leg. EXAM: LEFT FEMUR 2 VIEWS COMPARISON:  Left knee and hip radiographs - earlier same day, left hip radiographs - 12/25/2014; left knee radiographs - 11/29/2014; intraoperative radiographs of the left hip - 12/26/2014 FINDINGS: Post intra medullary rod and dynamic screw fixation of previously noted comminuted left intertrochanteric femur fracture. No definite evidence of hardware failure or loosening. Persistently displaced intratrochanteric fracture fragments are grossly unchanged compared to intraoperative radiograph performed 12/26/2014. There is an acute minimally displaced obliquely oriented fracture involving the distal femoral metadiaphysis with minimal foreshortening but without definite extension to the cranial aspect of the femoral component of the knee prosthesis. IMPRESSION: 1. Acute obliquely oriented minimally displaced fracture involving the distal femoral  meta diaphysis with minimal foreshortening but without definitive extension to involve the femoral component of the knee prosthesis. Correlation with dedicated knee radiographs performed earlier same day is recommended. 2. Stable sequela of intra medullary rod and dynamic screw fixation of previously noted comminuted left intertrochanteric femur fracture without definite evidence of hardware failure or loosening or definitive acute superimposed injury / fracture. Electronically Signed   By: Sandi Mariscal M.D.   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 Left radial fracture/left femur fracture related to fall this morning. Patient with history of moderate to severe left sided hemiparesis. No signs symptoms of infectious process. Chest x-ray without infiltrate waiting urinalysis is with history of vertigo -Admit to medical floor -Pain management -Dr. Fredna Dow with hand surgery notified and Dr. Ihor Gully with or to P6 notified -Keep nothing by mouth and hold his Plavix for now -Use SCDs -Note patient had surgery on left hip in September of this year -Recent echo reveals EF 60% and grade 1 diastolic dysfunction, EKG without acute changes and sinus rhythm chest x-ray without infiltrate await urinalysis. No history of CAD  #2. Hypertension. Controlled in the emergency department. Home medications include amlodipine and hytrin.  -Hold home medications for now -On admission blood pressure 125/83 -On its her and provide when necessary hydralazine  #3. History of stroke in november of this year -Left hemiparesis participating in home health PT OT appears to be at baseline -See  above -Holding Plavix for now  #4. Diastolic dysfunction grade 1. Echo done last month reveals EF of 60% -euvolemic -Daily weights  #5 hypothyroidism. -Hold his home Synthroid -Obtain a TSH  #5. Depression/anxiety/dementia -sTable at baseline -Home medications include Aricept, Lexapro -We'll hold these for now  Dr Alvan Dame ortho Dr Fredna Dow hand   Code Status: limited DVT Prophylaxis: Family Communication: wife and daughter Disposition  Plan: [facility] need rehab  Time spent: 72 minutes  [facility]

## 2015-03-14 NOTE — ED Provider Notes (Signed)
CSN: [id number]     Arrival date & time 03/14/15  0804 History   First MD Initiated Contact with Patient 03/14/15 [phone]     Chief Complaint  Patient presents with  . Fall  . Wrist Injury  . Hip Pain  . Knee Pain     (Consider location/radiation/quality/duration/timing/severity/associated sxs/prior Treatment) HPI Comments: Patient here after mechanical fall from his bed today. Patient rolled out of bed and fell onto his left wrist. He has obvious deformity. Denies any head trauma. No neck pain. Slight left lateral rib discomfort but denies any dyspnea. Also complains of left knee and left hip pain characterized as sharp and worse with movement. Has a history of left hip replacement in the past. Patient normally endplates with a walker. EMS was called and patient given fentanyl and Zofran prior to arrival and does flow better. He denies any recent illnesses.  Patient is a 81 y.o. male presenting with fall, wrist injury, hip pain, and knee pain. The history is provided by the patient and the spouse.  Fall  Wrist Injury Hip Pain  Knee Pain   Past Medical History  Diagnosis Date  . Hypertension   . Hypothyroidism   . Diverticulosis of colon   . BPH (benign prostatic hypertrophy)   . Pancreatitis 2011    and gallstone pancreatitis and Ecoli bacteremia  . 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 followed by [name]- observation as of 09/2010  . Rabbit fever 1940  . History of shingles   . Dementia   . Pneumonia     2016  . Stroke ([place]) 02/2015    with L sided weakness.    Past Surgical History  Procedure Laterality Date  . Appendectomy  1959  . Thyroidectomy, partial  1966  . Knee arthroscopy  06/1995    left Dr Derrel Nip  . Joint replacement  04/30/2001    bilateral TKR  . Cholecystectomy  10/2009  . Eye muscle surgery      12/12 had double vision Dr[facility] Juleen China at [facility]  . Orif hip fracture Left 12/26/2014    Procedure: OPEN REDUCTION INTERNAL FIXATION HIP;   Surgeon: Paralee Cancel, MD;  Location: [facility];  Service: Orthopedics;  Laterality: Left;   Family History  Problem Relation Age of Onset  . Hypertension Mother   . Stroke Mother   . Alcohol abuse Father   . Cancer Brother     prostate CA  . Heart disease Brother    Social History  Substance Use Topics  . Smoking status: Never Smoker   . Smokeless tobacco: Never Used  . Alcohol Use: No    Review of Systems  All other systems reviewed and are negative.     Allergies  Sulfonamide derivatives and Valtrex  Home Medications   Prior to Admission medications   Medication Sig Start Date End Date Taking? Authorizing Provider  amLODipine (NORVASC) 10 MG tablet Take 1 tablet (10 mg total) by mouth daily. 02/12/15   Kelvin Cellar, MD  B Complex-C (B-COMPLEX WITH VITAMIN C) tablet Take 1 tablet by mouth daily. 07/13/12   Ripudeep Krystal Eaton, MD  clopidogrel (PLAVIX) 75 MG tablet Take 1 tablet (75 mg total) by mouth daily. 02/12/15   Kelvin Cellar, MD  donepezil (ARICEPT) 10 MG tablet Take 1 tablet (10 mg total) by mouth daily. 12/11/14   Tonia Ghent, MD  escitalopram (LEXAPRO) 5 MG tablet Take 1 tablet (5 mg total) by mouth daily.  02/18/15   Tonia Ghent, MD  ferrous sulfate 325 (65 FE) MG tablet Take 1 tablet (325 mg total) by mouth 2 (two) times daily with a meal. Patient not taking: Reported on 02/18/2015 12/30/14   Venetia Maxon Rama, MD  HYDROcodone-acetaminophen (NORCO/VICODIN) 5-325 MG per tablet Take 1-2 tablets by mouth every 6 (six) hours as needed for moderate pain. 12/28/14   Amber Constable, PA-C  levothyroxine (SYNTHROID, LEVOTHROID) 100 MCG tablet TAKE 1 TABLET EVERY DAY Patient taking differently: TAKE 100 MCG BY MOUTH DAILY 09/26/14   Tonia Ghent, MD  Multiple Vitamin (MULTIVITAMIN) tablet Take 1 tablet by mouth daily.      Historical Provider, MD  omeprazole (PRILOSEC) 40 MG capsule Take 40 mg by mouth daily.  12/10/14   Historical Provider, MD  ondansetron (ZOFRAN) 4 MG  tablet Take 1 tablet (4 mg total) by mouth every 6 (six) hours as needed for nausea. 12/30/14   Venetia Maxon Rama, MD  polyethylene glycol (MIRALAX / GLYCOLAX) packet Take 17 g by mouth daily. Patient taking differently: Take 17 g by mouth daily as needed.  12/30/14   Venetia Maxon Rama, MD  Probiotic Product (PROBIOTIC PO) Take 1 tablet by mouth daily.    Historical Provider, MD  terazosin (HYTRIN) 5 MG capsule Take 1 capsule (5 mg total) by mouth at bedtime. 08/11/14   Tonia Ghent, MD   BP 128/84 mmHg  Pulse 80  Temp(Src) 97.6 F (36.4 C) (Oral)  Resp 18  SpO2 92% Physical Exam  Constitutional: He is oriented to person, place, and time. He appears well-developed and well-nourished.  Non-toxic appearance. No distress.  HENT:  Head: Normocephalic and atraumatic.  Eyes: Conjunctivae, EOM and lids are normal. Pupils are equal, round, and reactive to light.  Neck: Normal range of motion. Neck supple. No tracheal deviation present. No thyroid mass present.  Cardiovascular: Normal rate, regular rhythm and normal heart sounds.  Exam reveals no gallop.   No murmur heard. Pulmonary/Chest: Effort normal and breath sounds normal. No stridor. No respiratory distress. He has no decreased breath sounds. He has no wheezes. He has no rhonchi. He has no rales.  Abdominal: Soft. Normal appearance and bowel sounds are normal. He exhibits no distension. There is no tenderness. There is no rebound and no CVA tenderness.  Musculoskeletal: Normal range of motion. He exhibits no edema or tenderness.       Arms:      Hands: Deformity at distal left forearm appreciated. Patient neurovascularly intact at left hand.  Left knee without effusion or bruising. Some pain with range of motion. Patella intact.  Left hip without shortening or rotation. Patient cannot lift due to pain  Neurological: He is alert and oriented to person, place, and time. He has normal strength. No cranial nerve deficit or sensory deficit. GCS  eye subscore is 4. GCS verbal subscore is 5. GCS motor subscore is 6.  Skin: Skin is warm and dry. No abrasion and no rash noted.  Psychiatric: He has a normal mood and affect. His speech is normal and behavior is normal.  Nursing note and vitals reviewed.   ED Course  Procedures (including critical care time) Labs Review Labs Reviewed - No data to display  Imaging Review No results found. I have personally reviewed and evaluated these images and lab results as part of my medical decision-making.   EKG Interpretation None      MDM   Final diagnoses:  None    Patient's x-rays  noted and left wrist placed in a sugar tong splint after discussing his case with the hand surgeon on call, Dr. Fredna Dow. Patient's distal femur fracture discussed with orthopedic on-call. We'll see patient in consultation request hospitalist to admit    Lacretia Leigh, MD 03/14/15 1210

## 2015-03-15 ENCOUNTER — Inpatient Hospital Stay (HOSPITAL_COMMUNITY): Payer: Commercial Managed Care - HMO

## 2015-03-15 DIAGNOSIS — S5292XA Unspecified fracture of left forearm, initial encounter for closed fracture: Secondary | ICD-10-CM

## 2015-03-15 DIAGNOSIS — G3183 Dementia with Lewy bodies: Secondary | ICD-10-CM

## 2015-03-15 DIAGNOSIS — K2270 Barrett's esophagus without dysplasia: Secondary | ICD-10-CM

## 2015-03-15 DIAGNOSIS — S72142A Displaced intertrochanteric fracture of left femur, initial encounter for closed fracture: Secondary | ICD-10-CM

## 2015-03-15 DIAGNOSIS — F0280 Dementia in other diseases classified elsewhere without behavioral disturbance: Secondary | ICD-10-CM

## 2015-03-15 LAB — BASIC METABOLIC PANEL
ANION GAP: 6 (ref 5–15)
BUN: 23 mg/dL — ABNORMAL HIGH (ref 6–20)
CO2: 31 mmol/L (ref 22–32)
Calcium: 8.9 mg/dL (ref 8.9–10.3)
Chloride: 105 mmol/L (ref 101–111)
Creatinine, Ser: 1.1 mg/dL (ref 0.61–1.24)
GLUCOSE: 105 mg/dL — AB (ref 65–99)
POTASSIUM: 4.2 mmol/L (ref 3.5–5.1)
Sodium: 142 mmol/L (ref 135–145)

## 2015-03-15 LAB — CBC
HEMATOCRIT: 37.1 % — AB (ref 39.0–52.0)
HEMOGLOBIN: 12 g/dL — AB (ref 13.0–17.0)
MCH: 28.1 pg (ref 26.0–34.0)
MCHC: 32.3 g/dL (ref 30.0–36.0)
MCV: 86.9 fL (ref 78.0–100.0)
Platelets: 211 10*3/uL (ref 150–400)
RBC: 4.27 MIL/uL (ref 4.22–5.81)
RDW: 13.9 % (ref 11.5–15.5)
WBC: 7.9 10*3/uL (ref 4.0–10.5)

## 2015-03-15 NOTE — Progress Notes (Signed)
    Subjective:     Patient reports pain as 5 on 0-10 scale.   Denies CP or SOB.  Voiding without difficulty. Positive flatus. Objective: Vital signs in last 24 hours: Temp:  [98 F (36.7 C)-98.7 F (37.1 C)] 98.7 F (37.1 C) (12/11 0456) Pulse Rate:  [75-87] 81 (12/11 0456) Resp:  [8-18] 17 (12/11 0456) BP: (113-156)/(77-95) 141/87 mmHg (12/11 0456) SpO2:  [90 %-98 %] 96 % (12/11 0456)  Intake/Output from previous day: 12/10 0701 - 12/11 0700 In: -  Out: 125 [Urine:125] Intake/Output this shift:    Labs:  Recent Labs  03/14/15 1051 03/15/15 0335  HGB 13.5 12.0*    Recent Labs  03/14/15 1051 03/15/15 0335  WBC 14.3* 7.9  RBC 4.87 4.27  HCT 41.6 37.1*  PLT 224 211    Recent Labs  03/14/15 1051 03/15/15 0335  NA 140 142  K 3.8 4.2  CL 105 105  CO2 27 31  BUN 20 23*  CREATININE 1.03 1.10  GLUCOSE 131* 105*  CALCIUM 9.0 8.9    Recent Labs  03/14/15 1712  INR 1.10    Physical Exam: Neurologically intact Compartment soft splint in good condition  Assessment/Plan: Awaiting definitive treatment  Continue medical care Plan per Dr Hilbert Corrigan D for Dr. Melina Schools [facility] [phone] 03/15/2015, 8:33 AM

## 2015-03-15 NOTE — Progress Notes (Signed)
PT Cancellation Note  Patient Details Name: Trig Parady Mckellips MRN: [id number] DOB: 1931-01-02   Cancelled Treatment:    Reason Eval/Treat Not Completed: Patient not medically ready   Noted awaiting Orthopedics/Dr. Aurea Graff decisions re: definitive management of fractures; Will follow-up tomorrow for appropriateness of PT eval;   Roney Marion, [place] Pager [phone] Office [phone]    Roney Marion [facility] 03/15/2015, 7:58 AM

## 2015-03-15 NOTE — Progress Notes (Signed)
CHART REVIEWED PT WITH CLOSED LEFT DISTAL RADIAL METAPHYSEAL FRACTURE PT WILL REQUIRE ORIF TO STABILIZE RADIUS SURGERY PLANNED FOR LEFT KNEE PERIPROSTHETIC FRACTURE WILL PLAN FOR BOTH SURGERIES 12/12 WITH DR. OLIN PREOP ORDERS PLACED

## 2015-03-15 NOTE — Progress Notes (Addendum)
Patient ID: Rayniel Luney Kuster, male   DOB: 03-20-1931, 81 y.o.   MRN: [id number]  [facility] PROGRESS NOTE  Aamil Merchant Tucci [id number] DOB: May 31, 1930 DOA: 104-01-2015 PCP: Elsie Stain, MD   Brief narrative:    81 y.o. male past medical history that includes stroke last month with moderate left-sided hemiparesis, BPH, hypertension, hypothyroidism, bilateral knee replacements, presented to the emergency department after a fall with chief complaint of left arm/wrist pain, left leg pain. Initial evaluation in the emergency room department revealed left distal femur and left distal radial fracture. Ortho consulted.   Assessment/Plan:    Principal Problem:   Left periprosthetic knee/femur fracture - pt still in pain this AM, 3/10 in severity - ortho team following, appreciate recommendations  - allow analgesia as needed  - ? Need for ORIF   Active Problems:   Left radial fracture - management per ortho team     Lewy body dementia without behavioral disturbance - stable mental status per wife who is at bedside     Barrett's esophagus - continue Protonix IV for now     Constipation - place on bowel regimen     Essential hypertension - reasonable inpatient control   DVT prophylaxis - Lovenox SQ  Code Status: partial code, no intubation and CPR, otherwise OK with pressors  Family Communication:  plan of care discussed with the patient and wife at bedside  Disposition Plan: Not ready for d/c, ortho following, need to clear for d/c  IV access:  Peripheral IV  Procedures and diagnostic studies:    Dg Ribs Unilateral W/chest Left 104-01-2015 No demonstrable rib fracture. No pneumothorax. Mild left base atelectasis. Lungs elsewhere clear.   Dg Wrist 2 Views Left 03/15/2015 Recent casting of distal radial and ulnar fracture.   Dg Wrist Complete Left 104-01-2015   Minimally comminuted Fracture of the distal LEFT radial metaphysis with ventral angulation. 2. Ulnar styloid  fracture.   Dg Knee 1-2 Views Left 104-01-2015 Spiral fracture distal femoral diaphysis, proximal to the total knee prosthesis. There is posterior and medial displacement of the distal fracture fragment with respect to the proximal fragment. No dislocation. There is a joint effusion. Total knee replaced prosthetic components appear well seated.  Dg Hip Unilat With Pelvis 2-3 Views Left 104-01-2015  Postoperative change on the left. Chronic avulsion of the lesser trochanter on the left. Mild narrowing both hip joints. No demonstrable acute fracture or dislocation.  Dg Femur Min 2 Views Left 104-01-2015   Acute obliquely oriented minimally displaced fracture involving the distal femoral meta diaphysis with minimal foreshortening but without definitive extension to involve the femoral component of the knee prosthesis. Correlation with dedicated knee radiographs performed earlier same day is recommended. 2. Stable sequela of intra medullary rod and dynamic screw fixation of previously noted comminuted left intertrochanteric femur fracture without definite evidence of hardware failure or loosening or definitive acute superimposed injury / fracture.   Medical Consultants:  Ortho   Other Consultants:  PT OT  IAnti-Infectives:   None  Faye Ramsay, MD  [place] Pager [phone]  If 7PM-7AM, please contact night-coverage [email or web address] Password [facility] 03/15/2015, 5:43 PM   LOS: 1 day   HPI/Subjective: No events overnight.   Objective: Filed Vitals:   03/14/15 1315 03/14/15 2020 03/15/15 0456 03/15/15 1257  BP: 156/95 132/79 141/87 143/83  Pulse: 87 75 81 82  Temp: 98 F (36.7 C) 98.5 F (36.9 C) 98.7 F (37.1 C) 99.4 F (37.4 C)  TempSrc:  Oral Oral Oral  Resp: 16 16 17 17   SpO2: 98% 98% 96% 96%   No intake or output data in the 24 hours ending 03/15/15 1743  Exam:   General:  Pt is alert, follows commands appropriately, not in acute distress  Cardiovascular: Regular rate and  rhythm, no rubs, no gallops  Respiratory: Clear to auscultation bilaterally, no wheezing, no crackles, no rhonchi  Abdomen: Soft, non tender, non distended, bowel sounds present, no guarding  Data Reviewed: Basic Metabolic Panel:  Recent Labs Lab 03/14/15 1051 03/15/15 0335  NA 140 142  K 3.8 4.2  CL 105 105  CO2 27 31  GLUCOSE 131* 105*  BUN 20 23*  CREATININE 1.03 1.10  CALCIUM 9.0 8.9   Liver Function Tests:  Recent Labs Lab 03/14/15 1051  AST 21  ALT 12*  ALKPHOS 85  BILITOT 0.4  PROT 6.1*  ALBUMIN 3.5   CBC:  Recent Labs Lab 03/14/15 1051 03/15/15 0335  WBC 14.3* 7.9  NEUTROABS 13.3*  --   HGB 13.5 12.0*  HCT 41.6 37.1*  MCV 85.4 86.9  PLT 224 211   Scheduled Meds: . clopidogrel  75 mg Oral Daily  . enoxaparin (LOVENOX) injection  40 mg Subcutaneous Q24H  . pantoprazole (PROTONIX) IV  40 mg Intravenous Q24H   Continuous Infusions: . sodium chloride

## 2015-03-16 ENCOUNTER — Encounter (HOSPITAL_COMMUNITY)
Admission: EM | Disposition: A | Discharge status: Home Health | Payer: Self-pay | Source: Home / Self Care | Attending: Internal Medicine

## 2015-03-16 ENCOUNTER — Inpatient Hospital Stay (HOSPITAL_COMMUNITY): Payer: Commercial Managed Care - HMO | Admitting: Anesthesiology

## 2015-03-16 ENCOUNTER — Encounter (HOSPITAL_COMMUNITY): Payer: Self-pay | Admitting: Certified Registered Nurse Anesthetist

## 2015-03-16 ENCOUNTER — Inpatient Hospital Stay (HOSPITAL_COMMUNITY): Payer: Commercial Managed Care - HMO

## 2015-03-16 DIAGNOSIS — D72829 Elevated white blood cell count, unspecified: Secondary | ICD-10-CM | POA: Diagnosis not present

## 2015-03-16 DIAGNOSIS — F0631 Mood disorder due to known physiological condition with depressive features: Secondary | ICD-10-CM

## 2015-03-16 DIAGNOSIS — I63511 Cerebral infarction due to unspecified occlusion or stenosis of right middle cerebral artery: Secondary | ICD-10-CM

## 2015-03-16 DIAGNOSIS — I5032 Chronic diastolic (congestive) heart failure: Secondary | ICD-10-CM | POA: Diagnosis present

## 2015-03-16 DIAGNOSIS — I639 Cerebral infarction, unspecified: Secondary | ICD-10-CM

## 2015-03-16 DIAGNOSIS — K5909 Other constipation: Secondary | ICD-10-CM | POA: Diagnosis present

## 2015-03-16 HISTORY — PX: ORIF FEMUR FRACTURE: SHX2119

## 2015-03-16 HISTORY — PX: OPEN REDUCTION INTERNAL FIXATION (ORIF) DISTAL RADIAL FRACTURE: SHX5989

## 2015-03-16 LAB — BASIC METABOLIC PANEL
ANION GAP: 7 (ref 5–15)
BUN: 27 mg/dL — AB (ref 6–20)
CALCIUM: 8.7 mg/dL — AB (ref 8.9–10.3)
CO2: 29 mmol/L (ref 22–32)
Chloride: 102 mmol/L (ref 101–111)
Creatinine, Ser: 1.07 mg/dL (ref 0.61–1.24)
GLUCOSE: 123 mg/dL — AB (ref 65–99)
Potassium: 3.9 mmol/L (ref 3.5–5.1)
Sodium: 138 mmol/L (ref 135–145)

## 2015-03-16 LAB — SURGICAL PCR SCREEN
MRSA, PCR: POSITIVE — AB
Staphylococcus aureus: POSITIVE — AB

## 2015-03-16 LAB — CBC
HCT: 37.2 % — ABNORMAL LOW (ref 39.0–52.0)
Hemoglobin: 11.9 g/dL — ABNORMAL LOW (ref 13.0–17.0)
MCH: 27.6 pg (ref 26.0–34.0)
MCHC: 32 g/dL (ref 30.0–36.0)
MCV: 86.3 fL (ref 78.0–100.0)
PLATELETS: 214 10*3/uL (ref 150–400)
RBC: 4.31 MIL/uL (ref 4.22–5.81)
RDW: 13.6 % (ref 11.5–15.5)
WBC: 12.8 10*3/uL — AB (ref 4.0–10.5)

## 2015-03-16 LAB — HEMOGLOBIN A1C
HEMOGLOBIN A1C: 5.8 % — AB (ref 4.8–5.6)
MEAN PLASMA GLUCOSE: 120 mg/dL

## 2015-03-16 SURGERY — OPEN REDUCTION INTERNAL FIXATION (ORIF) DISTAL RADIUS FRACTURE
Anesthesia: General | Site: Leg Upper | Laterality: Left

## 2015-03-16 MED ORDER — OXYCODONE HCL 5 MG/5ML PO SOLN
5.0000 mg | Freq: Once | ORAL | Status: DC | PRN
Start: 2015-03-16 — End: 2015-03-16

## 2015-03-16 MED ORDER — OXYCODONE HCL 5 MG PO TABS: 5.0000 mg | ORAL_TABLET | Freq: Once | ORAL | Status: DC | PRN

## 2015-03-16 MED ORDER — ENOXAPARIN SODIUM 40 MG/0.4ML ~~LOC~~ SOLN
40.0000 mg | SUBCUTANEOUS | Status: DC
  Administered 2015-03-17: 40 mg via SUBCUTANEOUS
  Filled 2015-03-16: qty 0.4

## 2015-03-16 MED ORDER — LACTATED RINGERS IV SOLN
INTRAVENOUS | Status: DC | PRN
  Administered 2015-03-16: 19:00:00 via INTRAVENOUS

## 2015-03-16 MED ORDER — MENTHOL 3 MG MT LOZG: 1.0000 | LOZENGE | OROMUCOSAL | Status: DC | PRN

## 2015-03-16 MED ORDER — FENTANYL CITRATE (PF) 100 MCG/2ML IJ SOLN
INTRAMUSCULAR | Status: AC
  Administered 2015-03-16: 50 ug via INTRAVENOUS
  Filled 2015-03-16: qty 2

## 2015-03-16 MED ORDER — ARTIFICIAL TEARS OP OINT
TOPICAL_OINTMENT | OPHTHALMIC | Status: DC | PRN
Start: 2015-03-16 — End: 2015-03-16
  Administered 2015-03-16: 1 via OPHTHALMIC

## 2015-03-16 MED ORDER — SODIUM CHLORIDE 0.9 % IV SOLN
INTRAVENOUS | Status: DC
  Administered 2015-03-16 – 2015-03-17 (×2): via INTRAVENOUS

## 2015-03-16 MED ORDER — CALCIUM CHLORIDE 10 % IV SOLN
INTRAVENOUS | Status: DC | PRN
  Administered 2015-03-16: 1 g via INTRAVENOUS

## 2015-03-16 MED ORDER — LABETALOL HCL 5 MG/ML IV SOLN
INTRAVENOUS | Status: AC
  Filled 2015-03-16: qty 4

## 2015-03-16 MED ORDER — BUPIVACAINE HCL (PF) 0.25 % IJ SOLN
INTRAMUSCULAR | Status: AC
  Filled 2015-03-16: qty 30

## 2015-03-16 MED ORDER — SENNOSIDES-DOCUSATE SODIUM 8.6-50 MG PO TABS
1.0000 | ORAL_TABLET | Freq: Two times a day (BID) | ORAL | Status: DC
  Administered 2015-03-16 – 2015-03-19 (×6): 1 via ORAL
  Filled 2015-03-16 (×5): qty 1

## 2015-03-16 MED ORDER — PROPOFOL 10 MG/ML IV BOLUS
INTRAVENOUS | Status: AC
  Filled 2015-03-16: qty 20

## 2015-03-16 MED ORDER — ACETAMINOPHEN 325 MG PO TABS
325.0000 mg | ORAL_TABLET | ORAL | Status: DC | PRN
Start: 2015-03-16 — End: 2015-03-16

## 2015-03-16 MED ORDER — ACETAMINOPHEN 160 MG/5ML PO SOLN
325.0000 mg | ORAL | Status: DC | PRN
  Filled 2015-03-16: qty 20.3

## 2015-03-16 MED ORDER — DEXTROSE 5 % IV SOLN
1.0000 g | INTRAVENOUS | Status: DC
  Administered 2015-03-16 – 2015-03-19 (×4): 1 g via INTRAVENOUS
  Filled 2015-03-16 (×5): qty 10

## 2015-03-16 MED ORDER — SUGAMMADEX SODIUM 200 MG/2ML IV SOLN
INTRAVENOUS | Status: DC | PRN
  Administered 2015-03-16: 140 mg via INTRAVENOUS

## 2015-03-16 MED ORDER — FENTANYL CITRATE (PF) 100 MCG/2ML IJ SOLN
INTRAMUSCULAR | Status: DC | PRN
  Administered 2015-03-16: 50 ug via INTRAVENOUS
  Administered 2015-03-16: 100 ug via INTRAVENOUS
  Administered 2015-03-16 (×3): 50 ug via INTRAVENOUS

## 2015-03-16 MED ORDER — SUGAMMADEX SODIUM 200 MG/2ML IV SOLN
INTRAVENOUS | Status: AC
  Filled 2015-03-16: qty 2

## 2015-03-16 MED ORDER — CHLORHEXIDINE GLUCONATE 4 % EX LIQD: 60.0000 mL | Freq: Once | CUTANEOUS | Status: DC

## 2015-03-16 MED ORDER — LACTATED RINGERS IV SOLN
INTRAVENOUS | Status: DC | PRN
  Administered 2015-03-16 (×2): via INTRAVENOUS

## 2015-03-16 MED ORDER — MUPIROCIN 2 % EX OINT
1.0000 "application " | TOPICAL_OINTMENT | Freq: Two times a day (BID) | CUTANEOUS | Status: DC
  Administered 2015-03-16 – 2015-03-19 (×7): 1 via NASAL
  Filled 2015-03-16: qty 22

## 2015-03-16 MED ORDER — DOCUSATE SODIUM 100 MG PO CAPS
100.0000 mg | ORAL_CAPSULE | Freq: Two times a day (BID) | ORAL | Status: DC
Start: 2015-03-16 — End: 2015-03-19
  Administered 2015-03-16 – 2015-03-19 (×6): 100 mg via ORAL
  Filled 2015-03-16 (×6): qty 1

## 2015-03-16 MED ORDER — ONDANSETRON HCL 4 MG/2ML IJ SOLN: 4.0000 mg | Freq: Four times a day (QID) | INTRAMUSCULAR | Status: DC | PRN

## 2015-03-16 MED ORDER — ACETAMINOPHEN 325 MG PO TABS: 650.0000 mg | ORAL_TABLET | Freq: Four times a day (QID) | ORAL | Status: DC | PRN

## 2015-03-16 MED ORDER — ACETAMINOPHEN 650 MG RE SUPP
650.0000 mg | Freq: Four times a day (QID) | RECTAL | Status: DC | PRN
Start: 2015-03-16 — End: 2015-03-19

## 2015-03-16 MED ORDER — CEFAZOLIN SODIUM-DEXTROSE 2-3 GM-% IV SOLR
2.0000 g | Freq: Four times a day (QID) | INTRAVENOUS | Status: DC
  Administered 2015-03-17 (×2): 2 g via INTRAVENOUS
  Filled 2015-03-16 (×3): qty 50

## 2015-03-16 MED ORDER — PHENYLEPHRINE HCL 10 MG/ML IJ SOLN
10.0000 mg | INTRAVENOUS | Status: DC | PRN
  Administered 2015-03-16: 20 ug/min via INTRAVENOUS

## 2015-03-16 MED ORDER — ROCURONIUM BROMIDE 50 MG/5ML IV SOLN
INTRAVENOUS | Status: AC
  Filled 2015-03-16: qty 1

## 2015-03-16 MED ORDER — POLYETHYLENE GLYCOL 3350 17 G PO PACK
17.0000 g | PACK | Freq: Every day | ORAL | Status: DC | PRN
  Administered 2015-03-17: 17 g via ORAL
  Filled 2015-03-16: qty 1

## 2015-03-16 MED ORDER — EPHEDRINE SULFATE 50 MG/ML IJ SOLN
INTRAMUSCULAR | Status: DC | PRN
  Administered 2015-03-16 (×2): 5 mg via INTRAVENOUS

## 2015-03-16 MED ORDER — FENTANYL CITRATE (PF) 100 MCG/2ML IJ SOLN
25.0000 ug | INTRAMUSCULAR | Status: DC | PRN
  Administered 2015-03-16 (×2): 50 ug via INTRAVENOUS

## 2015-03-16 MED ORDER — ONDANSETRON HCL 4 MG PO TABS: 4.0000 mg | ORAL_TABLET | Freq: Four times a day (QID) | ORAL | Status: DC | PRN

## 2015-03-16 MED ORDER — ALBUMIN HUMAN 5 % IV SOLN
INTRAVENOUS | Status: DC | PRN
  Administered 2015-03-16 (×2): via INTRAVENOUS

## 2015-03-16 MED ORDER — FENTANYL CITRATE (PF) 250 MCG/5ML IJ SOLN
INTRAMUSCULAR | Status: AC
  Filled 2015-03-16: qty 5

## 2015-03-16 MED ORDER — ROCURONIUM BROMIDE 100 MG/10ML IV SOLN
INTRAVENOUS | Status: DC | PRN
  Administered 2015-03-16: 30 mg via INTRAVENOUS
  Administered 2015-03-16: 10 mg via INTRAVENOUS
  Administered 2015-03-16: 35 mg via INTRAVENOUS
  Administered 2015-03-16: 15 mg via INTRAVENOUS

## 2015-03-16 MED ORDER — HYDROCODONE-ACETAMINOPHEN 5-325 MG PO TABS
1.0000 | ORAL_TABLET | Freq: Four times a day (QID) | ORAL | Status: DC | PRN
  Administered 2015-03-16 – 2015-03-18 (×6): 2 via ORAL
  Administered 2015-03-18 – 2015-03-19 (×3): 1 via ORAL
  Filled 2015-03-16: qty 2
  Filled 2015-03-16: qty 1
  Filled 2015-03-16 (×2): qty 2
  Filled 2015-03-16: qty 1
  Filled 2015-03-16 (×3): qty 2
  Filled 2015-03-16: qty 1

## 2015-03-16 MED ORDER — ONDANSETRON HCL 4 MG/2ML IJ SOLN
INTRAMUSCULAR | Status: DC | PRN
  Administered 2015-03-16: 4 mg via INTRAVENOUS

## 2015-03-16 MED ORDER — METOCLOPRAMIDE HCL 5 MG/ML IJ SOLN: 5.0000 mg | Freq: Three times a day (TID) | INTRAMUSCULAR | Status: DC | PRN

## 2015-03-16 MED ORDER — CEFAZOLIN SODIUM-DEXTROSE 2-3 GM-% IV SOLR
2.0000 g | INTRAVENOUS | Status: AC
  Administered 2015-03-16: 2 g via INTRAVENOUS
  Filled 2015-03-16: qty 50

## 2015-03-16 MED ORDER — ALUM & MAG HYDROXIDE-SIMETH 200-200-20 MG/5ML PO SUSP
30.0000 mL | ORAL | Status: DC | PRN
  Administered 2015-03-17: 30 mL via ORAL
  Filled 2015-03-16: qty 30

## 2015-03-16 MED ORDER — PROPOFOL 10 MG/ML IV BOLUS
INTRAVENOUS | Status: DC | PRN
  Administered 2015-03-16: 110 mg via INTRAVENOUS

## 2015-03-16 MED ORDER — MUPIROCIN 2 % EX OINT: TOPICAL_OINTMENT | Freq: Two times a day (BID) | CUTANEOUS | Status: DC

## 2015-03-16 MED ORDER — HYDROMORPHONE HCL 1 MG/ML IJ SOLN: 0.1000 mg | INTRAMUSCULAR | Status: DC | PRN

## 2015-03-16 MED ORDER — ONDANSETRON HCL 4 MG/2ML IJ SOLN
INTRAMUSCULAR | Status: AC
  Filled 2015-03-16: qty 2

## 2015-03-16 MED ORDER — PHENOL 1.4 % MT LIQD: 1.0000 | OROMUCOSAL | Status: DC | PRN

## 2015-03-16 MED ORDER — LIDOCAINE HCL (CARDIAC) 20 MG/ML IV SOLN
INTRAVENOUS | Status: DC | PRN
  Administered 2015-03-16: 100 mg via INTRAVENOUS

## 2015-03-16 MED ORDER — METOCLOPRAMIDE HCL 5 MG PO TABS: 5.0000 mg | ORAL_TABLET | Freq: Three times a day (TID) | ORAL | Status: DC | PRN

## 2015-03-16 MED ORDER — CHLORHEXIDINE GLUCONATE CLOTH 2 % EX PADS
6.0000 | MEDICATED_PAD | Freq: Every day | CUTANEOUS | Status: DC
  Administered 2015-03-16: 6 via TOPICAL

## 2015-03-16 MED ORDER — 0.9 % SODIUM CHLORIDE (POUR BTL) OPTIME
TOPICAL | Status: DC | PRN
  Administered 2015-03-16: 400 mL

## 2015-03-16 MED ORDER — PHENYLEPHRINE HCL 10 MG/ML IJ SOLN
INTRAMUSCULAR | Status: DC | PRN
  Administered 2015-03-16: 280 ug via INTRAVENOUS

## 2015-03-16 SURGICAL SUPPLY — 132 items
BANDAGE ELASTIC 3 VELCRO ST LF (GAUZE/BANDAGES/DRESSINGS) ×4 IMPLANT
BANDAGE ELASTIC 4 VELCRO ST LF (GAUZE/BANDAGES/DRESSINGS) ×4 IMPLANT
BIT DRILL 2.2 SS TIBIAL (BIT) ×2 IMPLANT
BIT DRILL 2.5 NCB (BIT) ×1 IMPLANT
BIT DRILL 2.5MM NCB (BIT) ×1
BIT DRILL 4.3 (BIT) ×3 IMPLANT
BIT DRILL 4.3MM (BIT) ×1
BIT DRILL 4.3X300MM (BIT) IMPLANT
BLADE SURG ROTATE 9660 (MISCELLANEOUS) IMPLANT
BNDG CMPR 9X4 STRL LF SNTH (GAUZE/BANDAGES/DRESSINGS) ×2
BNDG ESMARK 4X9 LF (GAUZE/BANDAGES/DRESSINGS) ×4 IMPLANT
BNDG GAUZE ELAST 4 BULKY (GAUZE/BANDAGES/DRESSINGS) ×4 IMPLANT
CANISTER SUCTION 2500CC (MISCELLANEOUS) ×4 IMPLANT
CAP LOCK NCB (Cap) ×8 IMPLANT
CLOSURE WOUND 1/2 X4 (GAUZE/BANDAGES/DRESSINGS)
CORDS BIPOLAR (ELECTRODE) ×4 IMPLANT
COVER MAYO STAND STRL (DRAPES) ×2 IMPLANT
COVER SURGICAL LIGHT HANDLE (MISCELLANEOUS) ×4 IMPLANT
CUFF TOURNIQUET SINGLE 18IN (TOURNIQUET CUFF) ×4 IMPLANT
CUFF TOURNIQUET SINGLE 24IN (TOURNIQUET CUFF) IMPLANT
CUFF TOURNIQUET SINGLE 34IN LL (TOURNIQUET CUFF) IMPLANT
CUFF TOURNIQUET SINGLE 44IN (TOURNIQUET CUFF) IMPLANT
DRAIN TLS ROUND 10FR (DRAIN) IMPLANT
DRAPE C-ARM 42X72 X-RAY (DRAPES) ×4 IMPLANT
DRAPE C-ARMOR (DRAPES) ×2 IMPLANT
DRAPE IMP U-DRAPE 54X76 (DRAPES) ×6 IMPLANT
DRAPE INCISE IOBAN 66X45 STRL (DRAPES) ×4 IMPLANT
DRAPE OEC MINIVIEW 54X84 (DRAPES) ×6 IMPLANT
DRAPE ORTHO SPLIT 77X108 STRL (DRAPES) ×8
DRAPE SURG 17X11 SM STRL (DRAPES) ×4 IMPLANT
DRAPE SURG 17X23 STRL (DRAPES) ×4 IMPLANT
DRAPE SURG ORHT 6 SPLT 77X108 (DRAPES) ×4 IMPLANT
DRILL BIT 4.3 (BIT) ×4
DRSG ADAPTIC 3X8 NADH LF (GAUZE/BANDAGES/DRESSINGS) ×4 IMPLANT
DRSG MEPILEX BORDER 4X12 (GAUZE/BANDAGES/DRESSINGS) ×2 IMPLANT
DRSG MEPILEX BORDER 4X8 (GAUZE/BANDAGES/DRESSINGS) ×2 IMPLANT
DRSG PAD ABDOMINAL 8X10 ST (GAUZE/BANDAGES/DRESSINGS) ×4 IMPLANT
DURAPREP 26ML APPLICATOR (WOUND CARE) ×4 IMPLANT
ELECT CAUTERY BLADE 6.4 (BLADE) ×4 IMPLANT
ELECT REM PT RETURN 9FT ADLT (ELECTROSURGICAL) ×4
ELECTRODE REM PT RTRN 9FT ADLT (ELECTROSURGICAL) ×2 IMPLANT
EVACUATOR 1/8 PVC DRAIN (DRAIN) IMPLANT
FACESHIELD WRAPAROUND (MASK) ×8 IMPLANT
FACESHIELD WRAPAROUND OR TEAM (MASK) IMPLANT
GAUZE SPONGE 4X4 12PLY STRL (GAUZE/BANDAGES/DRESSINGS) ×4 IMPLANT
GAUZE SPONGE 4X4 16PLY XRAY LF (GAUZE/BANDAGES/DRESSINGS) ×4 IMPLANT
GLOVE BIOGEL PI IND STRL 7.5 (GLOVE) ×2 IMPLANT
GLOVE BIOGEL PI IND STRL 8 (GLOVE) ×2 IMPLANT
GLOVE BIOGEL PI IND STRL 8.5 (GLOVE) ×2 IMPLANT
GLOVE BIOGEL PI INDICATOR 7.5 (GLOVE) ×2
GLOVE BIOGEL PI INDICATOR 8 (GLOVE) ×2
GLOVE BIOGEL PI INDICATOR 8.5 (GLOVE) ×2
GLOVE ORTHO TXT STRL SZ7.5 (GLOVE) ×4 IMPLANT
GLOVE SURG ORTHO 8.0 STRL STRW (GLOVE) ×4 IMPLANT
GOWN STRL REUS W/ TWL LRG LVL3 (GOWN DISPOSABLE) ×6 IMPLANT
GOWN STRL REUS W/ TWL XL LVL3 (GOWN DISPOSABLE) ×2 IMPLANT
GOWN STRL REUS W/TWL LRG LVL3 (GOWN DISPOSABLE) ×12
GOWN STRL REUS W/TWL XL LVL3 (GOWN DISPOSABLE) ×4
GUIDE PIN 2.0MM (PIN) ×2 IMPLANT
K-WIRE 1.6 (WIRE) ×8
K-WIRE FX5X1.6XNS BN SS (WIRE) ×4
KIT BASIN OR (CUSTOM PROCEDURE TRAY) ×4 IMPLANT
KIT ROOM TURNOVER OR (KITS) ×4 IMPLANT
KWIRE FX5X1.6XNS BN SS (WIRE) IMPLANT
MANIFOLD NEPTUNE II (INSTRUMENTS) ×4 IMPLANT
NDL HYPO 25X1 1.5 SAFETY (NEEDLE) ×2 IMPLANT
NEEDLE HYPO 25X1 1.5 SAFETY (NEEDLE) ×4 IMPLANT
NS IRRIG 1000ML POUR BTL (IV SOLUTION) ×6 IMPLANT
PACK ORTHO EXTREMITY (CUSTOM PROCEDURE TRAY) ×4 IMPLANT
PACK UNIVERSAL I (CUSTOM PROCEDURE TRAY) ×4 IMPLANT
PAD ARMBOARD 7.5X6 YLW CONV (MISCELLANEOUS) ×8 IMPLANT
PAD CAST 3X4 CTTN HI CHSV (CAST SUPPLIES) IMPLANT
PAD CAST 4YDX4 CTTN HI CHSV (CAST SUPPLIES) ×2 IMPLANT
PADDING CAST COTTON 3X4 STRL (CAST SUPPLIES) ×4
PADDING CAST COTTON 4X4 STRL (CAST SUPPLIES) ×4
PEG LOCKING SMOOTH 2.2X18 (Peg) ×2 IMPLANT
PLATE DIST FEMUR LT 13H NCB (Plate) ×2 IMPLANT
PLATE LONG DVR LEFT (Plate) ×2 IMPLANT
SCREW CANCELLOUS 5.0X80 (Screw) ×2 IMPLANT
SCREW CANCELLOUS 5.0X85 (Screw) ×2 IMPLANT
SCREW CORT NCB SELFTAP 5.0X50 (Screw) ×2 IMPLANT
SCREW CORTICAL NCB 5.0X40 (Screw) ×8 IMPLANT
SCREW LOCK 14X2.7X 3 LD TPR (Screw) IMPLANT
SCREW LOCK 16X2.7X 3 LD TPR (Screw) IMPLANT
SCREW LOCK 18X2.7X 3 LD TPR (Screw) IMPLANT
SCREW LOCK 20X2.7X 3 LD TPR (Screw) IMPLANT
SCREW LOCK 22X2.7X 3 LD TPR (Screw) IMPLANT
SCREW LOCK 24X2.7X3 LD THRD (Screw) IMPLANT
SCREW LOCK 26X2.7X 3 LD TPR (Screw) IMPLANT
SCREW LOCKING 2.7X14 (Screw) ×4 IMPLANT
SCREW LOCKING 2.7X15MM (Screw) ×4 IMPLANT
SCREW LOCKING 2.7X16 (Screw) ×16 IMPLANT
SCREW LOCKING 2.7X18 (Screw) ×4 IMPLANT
SCREW LOCKING 2.7X20MM (Screw) ×4 IMPLANT
SCREW LOCKING 2.7X22MM (Screw) ×8 IMPLANT
SCREW LOCKING 2.7X24MM (Screw) ×4 IMPLANT
SCREW LOCKING 2.7X26MM (Screw) ×4 IMPLANT
SCREW NCB 5.0X38 (Screw) ×2 IMPLANT
SCREW NCB 5.0X55MM (Screw) ×2 IMPLANT
SCREW NCB 5.0X70MM (Screw) ×2 IMPLANT
SCREW NCB 5.0X75MM (Screw) ×4 IMPLANT
SCREW NCB PT 75X32X5XHEX (Screw) IMPLANT
SCREW NONLOCK 2.7X18MM (Screw) ×2 IMPLANT
SCREW NONLOCK 2.7X20MM (Screw) ×2 IMPLANT
SOAP 2 % CHG 4 OZ (WOUND CARE) ×4 IMPLANT
SPLINT FIBERGLASS 3X35 (CAST SUPPLIES) ×2 IMPLANT
SPONGE GAUZE 4X4 12PLY STER LF (GAUZE/BANDAGES/DRESSINGS) ×2 IMPLANT
SPONGE LAP 4X18 X RAY DECT (DISPOSABLE) ×2 IMPLANT
STAPLER VISISTAT 35W (STAPLE) ×4 IMPLANT
STOCKINETTE IMPERVIOUS LG (DRAPES) ×2 IMPLANT
STRIP CLOSURE SKIN 1/2X4 (GAUZE/BANDAGES/DRESSINGS) IMPLANT
SUT ETHILON 4 0 PS 2 18 (SUTURE) IMPLANT
SUT MNCRL AB 4-0 PS2 18 (SUTURE) IMPLANT
SUT VIC AB 0 CT1 27 (SUTURE) ×12
SUT VIC AB 0 CT1 27XBRD ANBCTR (SUTURE) ×6 IMPLANT
SUT VIC AB 1 CT1 27 (SUTURE) ×4
SUT VIC AB 1 CT1 27XBRD ANBCTR (SUTURE) ×2 IMPLANT
SUT VIC AB 2-0 CT1 27 (SUTURE) ×4
SUT VIC AB 2-0 CT1 TAPERPNT 27 (SUTURE) ×2 IMPLANT
SUT VIC AB 2-0 FS1 27 (SUTURE) ×2 IMPLANT
SUT VICRYL 4-0 PS2 18IN ABS (SUTURE) IMPLANT
SUT VICRYL RAPIDE 4/0 PS 2 (SUTURE) ×2 IMPLANT
SUT WIRE 16GA (Orthopedic Implant) ×4 IMPLANT
SYR CONTROL 10ML LL (SYRINGE) IMPLANT
SYSTEM CHEST DRAIN TLS 7FR (DRAIN) IMPLANT
TOWEL OR 17X24 6PK STRL BLUE (TOWEL DISPOSABLE) ×4 IMPLANT
TOWEL OR 17X26 10 PK STRL BLUE (TOWEL DISPOSABLE) ×6 IMPLANT
TUBE CONNECTING 12'X1/4 (SUCTIONS) ×1
TUBE CONNECTING 12X1/4 (SUCTIONS) ×3 IMPLANT
TUBING BULK SUCTION (MISCELLANEOUS) ×2 IMPLANT
WATER STERILE IRR 1000ML POUR (IV SOLUTION) ×4 IMPLANT
YANKAUER SUCT BULB TIP NO VENT (SUCTIONS) ×2 IMPLANT

## 2015-03-16 NOTE — Anesthesia Preprocedure Evaluation (Addendum)
Anesthesia Evaluation  Patient identified by MRN, date of birth, ID band Patient awake    Reviewed: Allergy & Precautions, NPO status , Patient's Chart, lab work & pertinent test results  Airway Mallampati: II  TM Distance: >3 FB Neck ROM: Full    Dental  (+) Edentulous Upper, Edentulous Lower   Pulmonary neg pulmonary ROS,    breath sounds clear to auscultation       Cardiovascular hypertension, Pt. on medications + Peripheral Vascular Disease and +CHF   Rhythm:Regular Rate:Normal  EF 65%   Neuro/Psych PSYCHIATRIC DISORDERS CVA, Residual Symptoms    GI/Hepatic Neg liver ROS, GERD  Medicated and Controlled,  Endo/Other  Hypothyroidism   Renal/GU negative Renal ROS     Musculoskeletal   Abdominal   Peds  Hematology negative hematology ROS (+)   Anesthesia Other Findings   Reproductive/Obstetrics                              Component Value Date/Time   WBC 12.8* 03/16/2015 0423   WBC 7.6 01/06/2015   RBC 4.31 03/16/2015 0423   HGB 11.9* 03/16/2015 0423   HCT 37.2* 03/16/2015 0423   PLT 214 03/16/2015 0423   LABPROT 14.4 1Jul 20, 202016 1712   INR 1.10 1Jul 20, 202016 1712   APTT 35 02/09/2015 1755      Component Value Date/Time   NA 138 03/16/2015 0423   NA 143 01/06/2015   K 3.9 03/16/2015 0423   CL 102 03/16/2015 0423   CO2 29 03/16/2015 0423   BUN 27* 03/16/2015 0423   BUN 32* 01/06/2015   CREATININE 1.07 03/16/2015 0423   CREATININE 1.3 01/06/2015   CREATININE 1.14 05/09/2014 1610   GLU 87 01/06/2015   CALCIUM 8.7* 03/16/2015 0423   ALKPHOS 85 1Jul 20, 202016 1051   AST 21 1Jul 20, 202016 1051   ALT 12* 1Jul 20, 202016 1051   BILITOT 0.4 1Jul 20, 202016 1051      Anesthesia Physical Anesthesia Plan  ASA: III  Anesthesia Plan: General   Post-op Pain Management:    Induction: Intravenous  Airway Management Planned: Oral ETT  Additional Equipment:   Intra-op Plan:   Post-operative  Plan: Extubation in OR  Informed Consent: I have reviewed the patients History and Physical, chart, labs and discussed the procedure including the risks, benefits and alternatives for the proposed anesthesia with the patient or authorized representative who has indicated his/her understanding and acceptance.   Dental advisory given  Plan Discussed with: CRNA and Surgeon  Anesthesia Plan Comments:        Anesthesia Quick Evaluation

## 2015-03-16 NOTE — Brief Op Note (Signed)
108-19-202016 - 03/16/2015  6:04 PM  PATIENT:  [name]  81 y.o. male  PRE-OPERATIVE DIAGNOSIS:  Left distal peri-prosthetic femur fracture  POST-OPERATIVE DIAGNOSIS:  Left distal peri-prosthetic femur fracture  PROCEDURE:  Procedure(s):  OPEN REDUCTION INTERNAL FIXATION (ORIF) Left DISTAL FEMUR FRACTURE   SURGEON:  Surgeon(s) and Role:    * Iran Planas, MD - Primary    * Paralee Cancel, MD - Assisting  PHYSICIAN ASSISTANT: Danae Orleans, PA-C  ANESTHESIA:   general  EBL:  Total I/O In: -  Out: 300 [Urine:300]  BLOOD ADMINISTERED:none  DRAINS: none   LOCAL MEDICATIONS USED:  NONE  SPECIMEN:  No Specimen  DISPOSITION OF SPECIMEN:  N/A  COUNTS:  YES  TOURNIQUET:  None for left femur fracture  DICTATION: .Other Dictation: Dictation Number [id number]  PLAN OF CARE: Admit to inpatient   PATIENT DISPOSITION:  PACU - hemodynamically stable.   Delay start of Pharmacological VTE agent (>24hrs) due to surgical blood loss or risk of bleeding: no

## 2015-03-16 NOTE — Anesthesia Procedure Notes (Signed)
Procedure Name: Intubation Date/Time: 03/16/2015 6:24 PM Performed by: Willeen Cass P Pre-anesthesia Checklist: Patient identified, Timeout performed, Emergency Drugs available, Suction available and Patient being monitored Patient Re-evaluated:Patient Re-evaluated prior to inductionOxygen Delivery Method: Circle system utilized Preoxygenation: Pre-oxygenation with 100% oxygen Intubation Type: IV induction Ventilation: Mask ventilation without difficulty and Oral airway inserted - appropriate to patient size Laryngoscope Size: Mac and 4 Grade View: Grade I Tube type: Oral Tube size: 7.0 mm Number of attempts: 1 Airway Equipment and Method: Stylet Placement Confirmation: ETT inserted through vocal cords under direct vision,  breath sounds checked- equal and bilateral and positive ETCO2 Secured at: 22 cm Tube secured with: Tape Dental Injury: Teeth and Oropharynx as per pre-operative assessment

## 2015-03-16 NOTE — Brief Op Note (Signed)
112-23-202016 - 03/16/2015  5:54 PM  PATIENT:  [name]  81 y.o. male  PRE-OPERATIVE DIAGNOSIS:  Left Radius and Left Distal Femur Fractures  POST-OPERATIVE DIAGNOSIS:  * No post-op diagnosis entered *  PROCEDURE:  Procedure(s): OPEN REDUCTION INTERNAL FIXATION (ORIF) DISTAL RADIAL FRACTURE (Left) OPEN REDUCTION INTERNAL FIXATION (ORIF) DISTAL FEMUR FRACTURE (Left)  SURGEON:  Surgeon(s) and Role:    * Iran Planas, MD - Primary    * Paralee Cancel, MD - Assisting  PHYSICIAN ASSISTANT:   ASSISTANTS: none   ANESTHESIA:   general  EBL:  Total I/O In: -  Out: 300 [Urine:300]  BLOOD ADMINISTERED:none  DRAINS: none   LOCAL MEDICATIONS USED:  NONE  SPECIMEN:  No Specimen  DISPOSITION OF SPECIMEN:  N/A  COUNTS:  YES  TOURNIQUET:    DICTATION: .Other Dictation: Dictation Number [id number]  PLAN OF CARE: Admit to inpatient   PATIENT DISPOSITION:  PACU - hemodynamically stable.   Delay start of Pharmacological VTE agent (>24hrs) due to surgical blood loss or risk of bleeding: not applicable

## 2015-03-16 NOTE — Anesthesia Postprocedure Evaluation (Signed)
Anesthesia Post Note  Patient: [name]  Procedure(s) Performed: Procedure(s) (LRB): OPEN REDUCTION INTERNAL FIXATION (ORIF) DISTAL RADIAL FRACTURE (Left) OPEN REDUCTION INTERNAL FIXATION (ORIF) DISTAL FEMUR FRACTURE (Left)  Patient location during evaluation: PACU Anesthesia Type: General Level of consciousness: awake and confused Pain management: pain level controlled Vital Signs Assessment: post-procedure vital signs reviewed and stable Respiratory status: spontaneous breathing Cardiovascular status: stable Anesthetic complications: no    Last Vitals:  Filed Vitals:   03/16/15 2137 03/16/15 2138  BP: 94/73 96/74  Pulse: 102 99  Temp:    Resp: 10 9    Last Pain:  Filed Vitals:   03/16/15 2140  PainSc: 5     LLE Motor Response: Non-purposeful movement (03/16/15 2127)            Kennie Snedden,JAMES TERRILL

## 2015-03-16 NOTE — Progress Notes (Signed)
OT Cancellation Note  Patient Details Name: [name] MRN: [id number] DOB: 08/09/1930   Cancelled Treatment:    Reason Eval/Treat Not Completed: Other (comment) Pt to have surgical fixation of fractures today will hold and await post op orders for eval.  Phineas Semen 03/16/2015, 8:39 AM

## 2015-03-16 NOTE — Progress Notes (Signed)
PT Cancellation Note  Patient Details Name: [name] MRN: [id number] DOB: 11-29-1930   Cancelled Treatment:    Reason Eval/Treat Not Completed: Other (comment) (pt to have surgical fixation of fractures today will hold and await post op orders for eval)   [name] 03/16/2015, 7:11 AM Elwyn Reach, [place]

## 2015-03-16 NOTE — Progress Notes (Signed)
PT SEEN/EXAMINED PLAN FOR ORIF LEFT DISTAL RADIUS R/B/A DISCUSSED WITH PT IN HOSPITAL.  PT VOICED UNDERSTANDING OF PLAN CONSENT SIGNED DAY OF SURGERY PT SEEN AND EXAMINED PRIOR TO OPERATIVE PROCEDURE/DAY OF SURGERY SITE MARKED. QUESTIONS ANSWERED WILL REMAIN AN INPATIENT FOLLOWING SURGERY

## 2015-03-16 NOTE — Transfer of Care (Signed)
Immediate Anesthesia Transfer of Care Note  Patient: [name]  Procedure(s) Performed: Procedure(s): OPEN REDUCTION INTERNAL FIXATION (ORIF) DISTAL RADIAL FRACTURE (Left) OPEN REDUCTION INTERNAL FIXATION (ORIF) DISTAL FEMUR FRACTURE (Left)  Patient Location: PACU  Anesthesia Type:General  Level of Consciousness: sedated  Airway & Oxygen Therapy: Patient Spontanous Breathing and Patient connected to nasal cannula oxygen  Post-op Assessment: Report given to RN, Post -op Vital signs reviewed and stable and Patient moving all extremities L sided hemiparesis baseline  Post vital signs: Reviewed and stable  Last Vitals:  Filed Vitals:   03/16/15 1300 03/16/15 1500  BP: 128/80 149/79  Pulse: 86 84  Temp: 36.7 C 36.6 C  Resp: 16 16    Complications: No apparent anesthesia complications

## 2015-03-16 NOTE — Progress Notes (Addendum)
Wife's cell phone [phone] or [phone]

## 2015-03-16 NOTE — Progress Notes (Signed)
Patient ID: [name], male   DOB: 01/08/1931, 81 y.o.   MRN: [id number]  Asked to address left distal peri-prosthetic femur fracture as I had recently fixed his left hip in September Met with his son and explained need for surgery and the fact that I am going to try and get this done in combination with Dr. Ortmann's ORIF of his left wrist  Case posted NPO Consent ordered 

## 2015-03-16 NOTE — Progress Notes (Addendum)
Patient ID: [name], male   DOB: 1930-04-17, 81 y.o.   MRN: [id number]  [facility] PROGRESS NOTE  Rajendra Stancato Tortorella [id number] DOB: October 21, 1930 DOA: 107-14-202016 PCP: Elsie Stain, MD   Brief narrative:    81 y.o. male past medical history that includes stroke last month with moderate left-sided hemiparesis, BPH, hypertension, hypothyroidism, bilateral knee replacements, presented to the emergency department after a fall with chief complaint of left arm/wrist pain, left leg pain. Initial evaluation in the emergency room department revealed left distal femur and left distal radial fracture. Ortho consulted.   Please note that patient was recently hospitalized for acute right MCA infarction that left him with residual left-sided weakness. Echocardiogram at that time noted EF of 65% with grade 1 diastolic CHF, carotid Dopplers 02/10/2015 showed bilateral 1-39% ICA stenosis. Patient was started on Plavix per neurology team. Inpatient rehabilitation has been recommended at that time but due to financial expenses, family took patient home with home health PT.  Assessment/Plan:    Principal Problem:   Left periprosthetic femur/knee fracture - please note that per NSQIP risk stratification, pt is at least moderate risk for surgical intervention given age, underlying HTN and hypertensive cerebrovascular disease, recent R MCA stroke 02/09/2015 (pt on Plavix), ? Acute UTI (please note pt started on empiric Rocephin) - hold Plavix per op and can resume post op - pt is still in significant pain this am, intermittently confused but reports pain 7/10 with movement  - ortho team following, appreciate recommendations  - plan to take to OR for ORIF - allow analgesia as needed  - pt will need PT and OT evaluation post operatively  - suspect pt will need SNF upon discharge   Active Problems:   Left distal metaphysial radial fracture - management per ortho team, take to OR today for ORIF -  appreciate ortho team following     Leukocytosis - Patient reports some dysuria and urinary urgency this morning - No fevers, asked for urinalysis and urine culture - Placed on Rocephin empirically until urinalysis results are back    Lewy body dementia without behavioral disturbance - stable mental status per wife who is at bedside  - pt demonstrates intermittent confusion, high risk for post op and hospital induced delirium  - Please note that recent MRI 02/09/2015 was notable for advanced atrophy, numerous micro-bleeds representing sequelae of hypertensive cerebrovascular disease    Recent R MCA stroke - 02/10/2015 - started on Plavix at that time    Barrett's esophagus - stable  - continue Protonix IV for now until post op and pt able to tolerate PO     Constipation - place on bowel regimen, Senokot twice a day and milk of magnesia as needed    Essential hypertension - reasonable inpatient control - on Norvasc at home, held for now as pt is NPO but can resume post op based on BP control     Chronic diastolic CHF, grade I  - per last 2 D ECHO 02/10/2015 - euvolemic for now - IVF pre op - weights 70 kg this AM - monitor daily weights, strict I/O  DVT prophylaxis - Lovenox SQ  Code Status: partial code, no intubation and CPR, otherwise OK with pressors  Family Communication:  plan of care discussed with the patient and wife at bedside  Disposition Plan: Not ready for d/c, ortho following  IV access:  Peripheral IV  Procedures and diagnostic studies:    Dg Ribs Unilateral W/chest Left 107-14-202016 No  demonstrable rib fracture. No pneumothorax. Mild left base atelectasis. Lungs elsewhere clear.   Dg Wrist 2 Views Left 03/15/2015 Recent casting of distal radial and ulnar fracture.   Dg Wrist Complete Left 107-09-202016   Minimally comminuted Fracture of the distal LEFT radial metaphysis with ventral angulation. 2. Ulnar styloid fracture.   Dg Knee 1-2 Views  Left 107-09-202016 Spiral fracture distal femoral diaphysis, proximal to the total knee prosthesis. There is posterior and medial displacement of the distal fracture fragment with respect to the proximal fragment. No dislocation. There is a joint effusion. Total knee replaced prosthetic components appear well seated.  Dg Hip Unilat With Pelvis 2-3 Views Left 107-09-202016  Postoperative change on the left. Chronic avulsion of the lesser trochanter on the left. Mild narrowing both hip joints. No demonstrable acute fracture or dislocation.  Dg Femur Min 2 Views Left 107-09-202016   Acute obliquely oriented minimally displaced fracture involving the distal femoral meta diaphysis with minimal foreshortening but without definitive extension to involve the femoral component of the knee prosthesis. Correlation with dedicated knee radiographs performed earlier same day is recommended. 2. Stable sequela of intra medullary rod and dynamic screw fixation of previously noted comminuted left intertrochanteric femur fracture without definite evidence of hardware failure or loosening or definitive acute superimposed injury / fracture.   Medical Consultants:  Ortho   Other Consultants:  PT OT  IAnti-Infectives:   Rocephin 12/12 -->  Faye Ramsay, MD  [facility] Pager [phone]  If 7PM-7AM, please contact night-coverage [email or web address] Password [email or web address] 03/16/2015, 12:59 PM   LOS: 2 days   HPI/Subjective: No events overnight. Dysuria and urinary urgency this AM.  Objective: Filed Vitals:   03/15/15 0456 03/15/15 1257 03/15/15 2007 03/16/15 0630  BP: 141/87 143/83 129/66 130/81  Pulse: 81 82 88 86  Temp: 98.7 F (37.1 C) 99.4 F (37.4 C) 99.6 F (37.6 C) 98.4 F (36.9 C)  TempSrc: Oral Oral Oral Oral  Resp: 17 17 18 17   SpO2: 96% 96% 90% 90%    Intake/Output Summary (Last 24 hours) at 03/16/15 1259 Last data filed at 03/16/15 0631  Gross per 24 hour  Intake      0 ml  Output    650 ml  Net   -650  ml    Exam:   General:  Pt is alert, intermittently confused, not in acute distress  Cardiovascular: Regular rate and rhythm, no rubs, no gallops  Respiratory: Clear to auscultation bilaterally, no wheezing, no crackles, no rhonchi  Abdomen: Soft, non tender, non distended, bowel sounds present, no guarding  Data Reviewed: Basic Metabolic Panel:  Recent Labs Lab 03/14/15 1051 03/15/15 0335 03/16/15 0423  NA 140 142 138  K 3.8 4.2 3.9  CL 105 105 102  CO2 27 31 29   GLUCOSE 131* 105* 123*  BUN 20 23* 27*  CREATININE 1.03 1.10 1.07  CALCIUM 9.0 8.9 8.7*   Liver Function Tests:  Recent Labs Lab 03/14/15 1051  AST 21  ALT 12*  ALKPHOS 85  BILITOT 0.4  PROT 6.1*  ALBUMIN 3.5   CBC:  Recent Labs Lab 03/14/15 1051 03/15/15 0335 03/16/15 0423  WBC 14.3* 7.9 12.8*  NEUTROABS 13.3*  --   --   HGB 13.5 12.0* 11.9*  HCT 41.6 37.1* 37.2*  MCV 85.4 86.9 86.3  PLT 224 211 214   Scheduled Meds: . cefTRIAXone  IV  1 g Intravenous Q24H  . clopidogrel  75 mg Oral Daily  . enoxaparin  injection  40 mg  Subcutaneous Q24H  . pantoprazole  IV  40 mg Intravenous Q24H   Continuous Infusions: . sodium chloride    . sodium chloride 75 mL/hr at 03/16/15 1002

## 2015-03-17 ENCOUNTER — Encounter (HOSPITAL_COMMUNITY): Payer: Self-pay | Admitting: Orthopedic Surgery

## 2015-03-17 DIAGNOSIS — D62 Acute posthemorrhagic anemia: Secondary | ICD-10-CM | POA: Diagnosis not present

## 2015-03-17 LAB — BASIC METABOLIC PANEL
Anion gap: 8 (ref 5–15)
BUN: 29 mg/dL — AB (ref 6–20)
CHLORIDE: 102 mmol/L (ref 101–111)
CO2: 29 mmol/L (ref 22–32)
CREATININE: 1.05 mg/dL (ref 0.61–1.24)
Calcium: 8.5 mg/dL — ABNORMAL LOW (ref 8.9–10.3)
GFR calc Af Amer: >60 mL/min (ref >60)
GFR calc non Af Amer: >60 mL/min (ref >60)
GLUCOSE: 129 mg/dL — AB (ref 65–99)
POTASSIUM: 4 mmol/L (ref 3.5–5.1)
SODIUM: 139 mmol/L (ref 135–145)

## 2015-03-17 LAB — CBC
HEMATOCRIT: 26.2 % — AB (ref 39.0–52.0)
HEMOGLOBIN: 8.6 g/dL — AB (ref 13.0–17.0)
MCH: 28.2 pg (ref 26.0–34.0)
MCHC: 32.8 g/dL (ref 30.0–36.0)
MCV: 85.9 fL (ref 78.0–100.0)
Platelets: 191 10*3/uL (ref 150–400)
RBC: 3.05 MIL/uL — AB (ref 4.22–5.81)
RDW: 13.5 % (ref 11.5–15.5)
WBC: 12.8 10*3/uL — ABNORMAL HIGH (ref 4.0–10.5)

## 2015-03-17 MED ORDER — HYDROCODONE-ACETAMINOPHEN 5-325 MG PO TABS: 1.0000 | ORAL_TABLET | Freq: Four times a day (QID) | ORAL | Status: DC | PRN

## 2015-03-17 MED ORDER — CLOPIDOGREL BISULFATE 75 MG PO TABS
75.0000 mg | ORAL_TABLET | Freq: Every day | ORAL | Status: DC
  Administered 2015-03-17 – 2015-03-19 (×3): 75 mg via ORAL
  Filled 2015-03-17 (×3): qty 1

## 2015-03-17 MED ORDER — PANTOPRAZOLE SODIUM 40 MG PO TBEC
40.0000 mg | DELAYED_RELEASE_TABLET | Freq: Every day | ORAL | Status: DC
  Administered 2015-03-18 – 2015-03-19 (×2): 40 mg via ORAL
  Filled 2015-03-17 (×2): qty 1

## 2015-03-17 MED ORDER — DOCUSATE SODIUM 100 MG PO CAPS: 100.0000 mg | ORAL_CAPSULE | Freq: Two times a day (BID) | ORAL | Status: DC

## 2015-03-17 MED ORDER — CIPROFLOXACIN HCL 500 MG PO TABS: 500.0000 mg | ORAL_TABLET | Freq: Two times a day (BID) | ORAL | Status: DC

## 2015-03-17 NOTE — Op Note (Signed)
NAMEMarland Kitchen  JUANLUIS, KERO NO.:  [id number]  MEDICAL RECORD NO.:  [id number]  LOCATION:  [id number]                        FACILITY:  [facility]  PHYSICIAN:  Linna Hoff IV, M.D.DATE OF BIRTH:  [birth year]  DATE OF PROCEDURE:  03/16/2015 DATE OF DISCHARGE:                              OPERATIVE REPORT   PREOPERATIVE DIAGNOSIS:  Left wrist Galeazzi fracture dislocation.  POSTOPERATIVE DIAGNOSIS:  Left wrist Galeazzi fracture dislocation.  ATTENDING PHYSICIAN:  Linna Hoff IV, MD, who scrubbed for the entire procedure.  ASSISTANT SURGEON:  None.  PROCEDURE: 1. Open treatment of left wrist Galeazzi fracture without     stabilization of the distal ulna. 2. Left wrist brachioradialis tendon release and lengthening. 3. Radiographs 2 views, left wrist and forearm.  SURGICAL IMPLANTS:  DVR Crosslock long plate with combination distal locking pegs and screws and 8 screws within the proximal shaft.  SURGICAL INDICATIONS:  Mr. Demmer is a right-hand-dominant gentleman who sustained a fall and injured the left radial shaft. The patient was recommended to undergo the above procedure given the unstable nature of the injury.  Risks, benefits, and alternatives were discussed in detail with the patient's family and signed informed consent was obtained. Risks include, but not limited to bleeding, infection, damage to nearby nerves, arteries, or tendons; loss of motion to wrist and digits; incomplete relief of symptoms; nonunion, malunion, hardware failure; and need for further surgical intervention.  DESCRIPTION OF PROCEDURE:  The patient was properly identified in the preoperative holding area and marked with a permanent marker made on the left wrist to indicate correct operative site.  The patient was then brought back to the operating room, placed supine on anesthesia table. General anesthesia administered, the patient tolerated this well.  A well-padded tourniquet was placed  on left brachium and sealed with 1000 drape.  Left upper extremity was then prepped and draped in normal sterile fashion.  Time-out was called, correct side was identified and procedure begun.  Attention was then turned left wrist.  Longitudinal incision was made directly over the FCR sheath.  Dissection was carried down through the skin and subcutaneous tissue.  The FPL was then carefully dissected all the way to the fracture site and the pronator quadratus was elevated.  Brachioradialis tendon tenotomies were done releasing the distal radial styloid.  This was carefully done with projection of first dorsal compartment tendons.  Once this was carried out, the distal radial fracture was then reduced and held in place with reduction clamp and a volar plate was then applied.  The plate height was then adjusted using mini C-arm and held distally and proximally with K-wires.  Wound was then thoroughly irrigated.  Fracture hematoma then evacuated.  Following distal fixation was then carried out with a combination of locking screws and pegs.  Further fixation was carried out in the shaft with a combination of locking and nonlocking screws. The wound was irrigated.  Final radiographs were then obtained.  The pronator quadratus was closed with 2-O Vicryl, subcutaneous tissue closed with 4-0 Vicryl, and skin closed with 4-0 Vicryl. Adaptic dressing and sterile compressive bandage were applied.  The patient was well-padded sugar-tong splint, extubated, and  taken to recovery room in good condition.  POSTPROCEDURE PLAN:  The patient was admitted back to the Medicine Service.  Dr. __________ and fix his distal femur fracture. Nonweightbearing left upper extremity.  Plan to see me back in 2 weeks for repeat radiographs, long-arm cast for total of 4 weeks, and then short-arm cast for total 2 weeks.  RADIOGRAPHIC INTERPRETATION:  AP, lateral, and oblique films of the wrist did show the volar plate  fixation in place and good alignment in both planes.     Melrose Nakayama, M.D.     FWO/MEDQ  D:  03/16/2015  T:  03/17/2015  Job:  [id number]

## 2015-03-17 NOTE — Clinical Social Work Note (Signed)
CSW received consult for patient needing SNF placement.  CSW to begin SNF search process.  Jones Broom. [place], MSW, [place] 03/17/2015 5:58 PM

## 2015-03-17 NOTE — Evaluation (Signed)
Occupational Therapy Evaluation Patient Details Name: [name] Age MRN: [id number] DOB: 09-06-1930 Today's Date: 03/17/2015    History of Present Illness 81 yo male with PMHx:  L hip fracture had gone home from SNF and fell at home unwitnessed.  ORIF L humerus, ORIF revision L hip.  NWB on LUE and PWB on LLE   Clinical Impression   Patient presenting with deconditioning, decreased I in self care, balance, functional mobility/transrers, decreased strength/functional use of L UE and L LE.  Patient required assistance of min - mod  A PTA. Patient currently functioning total A. Patient will benefit from acute OT to increase overall independence in the areas of ADLs, functional mobility, and safety in order to safely discharge to next venue of care.     Follow Up Recommendations  SNF    Equipment Recommendations  Other (comment) (defer to next venue of care)    Recommendations for Other Services       Precautions / Restrictions Precautions Precautions: Fall Restrictions Weight Bearing Restrictions: Yes LUE Weight Bearing: Non weight bearing LLE Weight Bearing: Partial weight bearing LLE Partial Weight Bearing Percentage or Pounds: 25%      Mobility Bed Mobility Overal bed mobility: Needs Assistance Bed Mobility: Supine to Sit;Sit to Supine     Supine to sit: Max assist;HOB elevated Sit to supine: Total assist   General bed mobility comments: Pt able to move R LE off of bed but required assistance with trunk and R LE/UE secondary to precautions.   Transfers                 General transfer comment: unable to perform secondary to decreased activity tolerance and safety.    Balance Overall balance assessment: History of Falls;Needs assistance Sitting-balance support: Feet supported;Single extremity supported Sitting balance-Leahy Scale: Poor Sitting balance - Comments: Pt seated and holding onto R bedrail with min - max A for sitting balance on EOB. Pt became  very resistive and starting to push back against therapist in order to lay back down after 3 minutes of sitting on EOB.              ADL Overall ADL's : Needs assistance/impaired         Upper Body Bathing: Maximal assistance;Sitting;Cueing for safety;Cueing for sequencing;Cueing for UE precautions   Lower Body Bathing: Bed level;Total assistance;Sitting/lateral leans;Cueing for sequencing;Cueing for safety   Upper Body Dressing : Maximal assistance;Cueing for safety;Cueing for sequencing;Cueing for UE precautions;Sitting   Lower Body Dressing: Total assistance;Cueing for sequencing;Cueing for safety;Cueing for compensatory techniques        General ADL Comments: Pt very lethargic this session. He is unaware of precautions for L UE and L LE. He was not oriented to situation,place, or time. He was also unable to tell me his correct birthday this session.                Pertinent Vitals/Pain Pain Assessment: Faces Pain Score: 7  Faces Pain Scale: Hurts whole lot Pain Location: L hip with movement Pain Descriptors / Indicators: Aching;Guarding;Grimacing;Sore Pain Intervention(s): Limited activity within patient's tolerance;Premedicated before session;Monitored during session;Repositioned     Hand Dominance Left   Extremity/Trunk Assessment Upper Extremity Assessment Upper Extremity Assessment: LUE deficits/detail LUE Deficits / Details: plating on L humeral fracture with elbow at 90 deg bend LUE: Unable to fully assess due to immobilization;Unable to fully assess due to pain LUE Coordination: decreased fine motor;decreased gross motor   Lower Extremity Assessment Lower Extremity Assessment: LLE  deficits/detail LLE Deficits / Details: upper femoral ORIF with newer ORIF from fall onto L side, has fracture near TKA LLE Coordination: decreased fine motor;decreased gross motor   Cervical / Trunk Assessment Cervical / Trunk Assessment: Kyphotic   Communication  Communication Communication: HOH   Cognition Arousal/Alertness: Lethargic Behavior During Therapy: Anxious Overall Cognitive Status: History of cognitive impairments - at baseline       Memory: Decreased recall of precautions;Decreased short-term memory                        Home Living Family/patient expects to be discharged to:: Skilled nursing facility                  Prior Functioning/Environment Level of Independence: Needs assistance  Gait / Transfers Assistance Needed: assisted with RW or transfer to the chair ADL's / Homemaking Assistance Needed: wife has been assisting since his hip fx in September with bathing, dressing.         OT Diagnosis: Generalized weakness;Acute pain   OT Problem List: Decreased strength;Decreased range of motion;Decreased activity tolerance;Impaired balance (sitting and/or standing);Decreased safety awareness;Pain;Impaired UE functional use;Decreased cognition;Decreased coordination;Decreased knowledge of precautions;Increased edema;Decreased knowledge of use of DME or AE   OT Treatment/Interventions: Self-care/ADL training;Therapeutic exercise;Neuromuscular education;Balance training;Therapeutic activities;Energy conservation;DME and/or AE instruction;Patient/family education    OT Goals(Current goals can be found in the care plan section) Acute Rehab OT Goals Patient Stated Goal: unable to state ADL Goals Pt Will Perform Grooming: with min assist;sitting Pt Will Perform Upper Body Bathing: sitting;with min assist Pt Will Perform Lower Body Bathing: with mod assist;bed level Pt Will Perform Upper Body Dressing: with min assist;sitting Pt Will Perform Lower Body Dressing: with mod assist;bed level Pt Will Transfer to Toilet: with mod assist;bedside commode Pt Will Perform Toileting - Clothing Manipulation and hygiene: with max assist;sitting/lateral leans  OT Frequency: Min 2X/week   Barriers to D/C:    none known at this  time          End of Session    Activity Tolerance: Patient limited by fatigue;Patient limited by lethargy;Patient limited by pain Patient left: in bed;with call bell/phone within reach;with bed alarm set;with family/visitor present;with SCD's reapplied   Time: 1411-1437 OT Time Calculation (min): 26 min Charges:  OT General Charges $OT Visit: 1 Procedure OT Evaluation $Initial OT Evaluation Tier I: 1 Procedure OT Treatments $Self Care/Home Management : 8-22 mins G-Codes:    Phineas Semen, MS, OTR/L 03/17/2015, 2:52 PM

## 2015-03-17 NOTE — Discharge Instructions (Signed)
VERY complicated weight bearing status, at this point based on ipsilateral injuries he will likely need to be NWB both left upper and left lower extremities  Hip Fracture A hip fracture is a fracture of the upper part of your thigh bone (femur).  CAUSES A hip fracture is caused by a direct blow to the side of your hip. This is usually the result of a fall but can occur in other circumstances, such as an automobile accident. RISK FACTORS There is an increased risk of hip fractures in people with:  An unsteady walking pattern (gait) and those with conditions that contribute to poor balance, such as Parkinson's disease or dementia.  Osteopenia and osteoporosis.  Cancer that spreads to the leg bones.  Certain metabolic diseases. SYMPTOMS  Symptoms of hip fracture include:  Pain over the injured hip.  Inability to put weight on the leg in which the fracture occurred (although, some patients are able to walk after a hip fracture).  Toes and foot of the affected leg point outward when you lie down. DIAGNOSIS A physical exam can determine if a hip fracture is likely to have occurred. X-ray exams are needed to confirm the fracture and to look for other injuries. The X-ray exam can help to determine the type of hip fracture. Rarely, the fracture is not visible on an X-ray image and a CT scan or MRI will have to be done. TREATMENT  The treatment for a fracture is usually surgery. This means using a screw, nail, or rod to hold the bones in place.  HOME CARE INSTRUCTIONS Take all medicines as directed by your health care provider. SEEK MEDICAL CARE IF: Pain continues, even after taking pain medicine. MAKE SURE YOU:  Understand these instructions.   Will watch your condition.  Will get help right away if you are not doing well or get worse.   This information is not intended to replace advice given to you by your health care provider. Make sure you discuss any questions you have with your  health care provider.   Document Released: 03/21/2005 Document Revised: 03/26/2013 Document Reviewed: 10/31/2012 Elsevier Interactive Patient Education [employer].

## 2015-03-17 NOTE — Discharge Summary (Addendum)
Physician Discharge Summary  [name] [id number] DOB: [birth year] DOA: 105/10/202016  PCP: Elsie Stain, MD D/C summary routed to PCP  Admit date: 105/10/202016 Discharge date: 03/18/2015  Recommendations for Outpatient Follow-up:  1. Pt will need to follow up with PCP in 1-2 weeks post discharge 2. Pt also needs to see Dr. Alvan Dame in 2 weeks post discharge, orthopedic specialist, please call to schedule an appointment with him  3. Please obtain BMP to evaluate electrolytes and kidney function 4. Please also check CBC to evaluate Hg and Hct levels 5. Pt to continue taking Cipro for 4 more days post discharge to complete therapy for UTI (this will complete total of 7 days treatment) 6. Please also note that Dr. Alvan Dame recommended continuing Plavix which pt was on prior to admission given recent stroke and this will be sufficient for DVT prophylaxis as well   Things to check prior to discharge:  1. Check Hg in Am to make sure pt does not need any blood transfusion prior to discharge 2. Check if urine culture back, Cipro should be adequate for UTI based on last sensitivity report  3. Check WBC to make sure its not trending up   Discharge Diagnoses:  Principal Problem:   Intertrochanteric fracture of left femur (HCC) Active Problems:   Left radial fracture   Leukocytosis   Recent right MCA stroke (HCC)   Lewy body dementia without behavioral disturbance   Barrett's esophagus   Essential hypertension   Chronic diastolic CHF (congestive heart failure), NYHA class 1 (HCC)   Chronic constipation, unspecified   Discharge Condition: Stable  Diet recommendation: Heart healthy diet discussed in details   Brief HPI: 81 y.o. male past medical history that includes stroke last month with moderate left-sided hemiparesis, BPH, hypertension, hypothyroidism, bilateral knee replacements, presented to the emergency department after a fall with chief complaint of left arm/wrist pain, left leg pain.  Initial evaluation in the emergency room department revealed left distal femur and left distal radial fracture. Ortho consulted.   Please note that patient was recently hospitalized for acute right MCA infarction that left him with residual left-sided weakness. Echocardiogram at that time noted EF of 65% with grade 1 diastolic CHF, carotid Dopplers 02/10/2015 showed bilateral 1-39% ICA stenosis. Patient was started on Plavix per neurology team. Inpatient rehabilitation has been recommended at that time but due to financial expenses, family took patient home with home health PT.  HOSPITAL COURSE: Principal Problem:  Left periprosthetic femur/knee fracture - please note that per NSQIP risk stratification, pt was determined to be at least moderate risk for surgical intervention given age, underlying HTN and hypertensive cerebrovascular disease, recent R MCA stroke 02/09/2015 (pt on Plavix) - pt is now s/p ORIF DISTAL RADIAL FRACTURE (Left) - Dr. Henderson [place] and s/p ORIF distal femur fx (Left) - Dr. Alvan Dame, post op day #1 - pt reports feeling better, pain is minimal in arm and LE, 2/10 and mostly with exertion - continue with PT/OT as pt able to tolerate  - placement in progress, SNF recommended   Active Problems:  Left distal metaphysial radial fracture - management per ortho team - s/p ORIF, post day #1 - continue to provide analgesia as needed so that pt can participate in OT    Leukocytosis - Patient reported some dysuria and urinary urgency 12/12 and was started on empiric Rocephin - UA 12/12 was suggestive of UTI but urine cultures have not been collected - continue Rocephin day #2/7, pt can be transitioned to  oral Cipro upon discharge  - follow upon urine cultures    Lewy body dementia without behavioral disturbance - stable mental status per wife who is at bedside  - pt continues to demonstrate intermittent confusion, high risk for post op and hospital induced delirium  - Please note  that recent MRI 02/09/2015 was notable for advanced atrophy, numerous micro-bleeds representing sequelae of hypertensive cerebrovascular disease - plan to d/c SNF in AM if bed available    Recent R MCA stroke - 02/10/2015 - started on Plavix at that time and this will be continued upon discharge    Barrett's esophagus - stable  - continue Protonix IV for now until post op and pt able to tolerate PO    Constipation - place on bowel regimen, Senokot twice a day and milk of magnesia as needed   Essential hypertension - reasonable inpatient control - continue Norvasc upon discharge    Chronic diastolic CHF, grade I  - per last 2 D ECHO 02/10/2015 - euvolemic for now - IVF pre op - weights 70 kg this AM - monitor daily weights, strict I/O  DVT prophylaxis - Plavix   Code Status: partial code, no intubation and CPR, otherwise OK with pressors  Family Communication: plan of care discussed with the patient and wife at bedside  Disposition Plan: SNF in AM if bed available    Procedures and diagnostic studies:   Dg Ribs Unilateral W/chest Left 12020-09-1214 No demonstrable rib fracture. No pneumothorax. Mild left base atelectasis. Lungs elsewhere clear.   Dg Wrist 2 Views Left 03/15/2015 Recent casting of distal radial and ulnar fracture.   Dg Wrist Complete Left 12020-09-1214 Minimally comminuted Fracture of the distal LEFT radial metaphysis with ventral angulation. 2. Ulnar styloid fracture.   Dg Knee 1-2 Views Left 12020-09-1214 Spiral fracture distal femoral diaphysis, proximal to the total knee prosthesis. There is posterior and medial displacement of the distal fracture fragment with respect to the proximal fragment. No dislocation. There is a joint effusion. Total knee replaced prosthetic components appear well seated.  Dg Hip Unilat With Pelvis 2-3 Views Left 12020-09-1214 Postoperative change on the left. Chronic avulsion of the lesser trochanter on the left. Mild  narrowing both hip joints. No demonstrable acute fracture or dislocation.  Dg Femur Min 2 Views Left 12020-09-1214 Acute obliquely oriented minimally displaced fracture involving the distal femoral meta diaphysis with minimal foreshortening but without definitive extension to involve the femoral component of the knee prosthesis. Correlation with dedicated knee radiographs performed earlier same day is recommended. 2. Stable sequela of intra medullary rod and dynamic screw fixation of previously noted comminuted left intertrochanteric femur fracture without definite evidence of hardware failure or loosening or definitive acute superimposed injury / fracture.   Medical Consultants:  Ortho   Other Consultants:  PT OT  IAnti-Infectives:   Rocephin 12/12 --> 12/14 and starting 12/15 pt can take Cipro BID for 4 more days      Discharge Exam: Filed Vitals:   03/16/15 2138 03/17/15 0517  BP: 96/74 125/78  Pulse: 99 88  Temp:  98.3 F (36.8 C)  Resp: 9 12   Filed Vitals:   03/16/15 2133 03/16/15 2137 03/16/15 2138 03/17/15 0517  BP: 94/73 94/73 96/74  125/78  Pulse: 100 102 99 88  Temp:    98.3 F (36.8 C)  TempSrc:    Oral  Resp: 8 10 9 12   SpO2: 95% 98% 95% 95%    General: Pt is alert, follows commands appropriately, not  in acute distress Cardiovascular: Regular rate and rhythm,no rubs, no gallops Respiratory: Clear to auscultation bilaterally, no wheezing, diminished breath sounds at bases  Abdominal: Soft, non tender, non distended, bowel sounds +, no guarding  Discharge Instructions  Discharge Instructions    Touch down weight bearing    Complete by:  As directed   25%            Medication List    TAKE these medications        amLODipine 10 MG tablet  Commonly known as:  NORVASC  Take 1 tablet (10 mg total) by mouth daily.     B-complex with vitamin C tablet  Take 1 tablet by mouth daily.     ciprofloxacin 500 MG tablet  Commonly known as:  CIPRO   Take 1 tablet (500 mg total) by mouth 2 (two) times daily for 4 more days post discharge      clopidogrel 75 MG tablet  Commonly known as:  PLAVIX  Take 1 tablet (75 mg total) by mouth daily.     docusate sodium 100 MG capsule  Commonly known as:  COLACE  Take 1 capsule (100 mg total) by mouth 2 (two) times daily.     donepezil 10 MG tablet  Commonly known as:  ARICEPT  Take 1 tablet (10 mg total) by mouth daily.     escitalopram 5 MG tablet  Commonly known as:  LEXAPRO  Take 1 tablet (5 mg total) by mouth daily.     ferrous sulfate 325 (65 FE) MG tablet  Take 1 tablet (325 mg total) by mouth 2 (two) times daily with a meal.     HYDROcodone-acetaminophen 5-325 MG tablet  Commonly known as:  NORCO  Take 1-2 tablets by mouth every 6 (six) hours as needed.     levothyroxine 100 MCG tablet  Commonly known as:  SYNTHROID, LEVOTHROID  TAKE 1 TABLET EVERY DAY     multivitamin tablet  Take 1 tablet by mouth daily.     omeprazole 40 MG capsule  Commonly known as:  PRILOSEC  Take 40 mg by mouth daily.     ondansetron 4 MG tablet  Commonly known as:  ZOFRAN  Take 1 tablet (4 mg total) by mouth every 6 (six) hours as needed for nausea.     polyethylene glycol packet  Commonly known as:  MIRALAX / GLYCOLAX  Take 17 g by mouth daily.     PROBIOTIC PO  Take 1 tablet by mouth daily.     terazosin 5 MG capsule  Commonly known as:  HYTRIN  Take 1 capsule (5 mg total) by mouth at bedtime.           Follow-up Information    Follow up with Mauri Pole, MD In 2 weeks.   Specialty:  Orthopedic Surgery   Contact information:   [address] [place] [address] [phone]       Follow up with Elsie Stain, MD.   Specialty:  [facility]   Contact information:   [place]  [address] [phone]        The results of significant diagnostics from this hospitalization (including imaging, microbiology, ancillary and  laboratory) are listed below for reference.     Microbiology: Recent Results (from the past 240 hour(s))  Surgical pcr screen     Status: Abnormal   Collection Time: 03/16/15  8:34 AM  Result Value Ref Range Status   MRSA, PCR  POSITIVE (A) NEGATIVE Final   Staphylococcus aureus POSITIVE (A) NEGATIVE Final    Comment:        The Xpert SA Assay (FDA approved for NASAL specimens in patients over 71 years of age), is one component of a comprehensive surveillance program.  Test performance has been validated by [facility] for patients greater than or equal to 64 year old. It is not intended to diagnose infection nor to guide or monitor treatment.      Labs: Basic Metabolic Panel:  Recent Labs Lab 03/14/15 1051 03/15/15 0335 03/16/15 0423 03/17/15 0600  NA 140 142 138 139  K 3.8 4.2 3.9 4.0  CL 105 105 102 102  CO2 27 31 29 29   GLUCOSE 131* 105* 123* 129*  BUN 20 23* 27* 29*  CREATININE 1.03 1.10 1.07 1.05  CALCIUM 9.0 8.9 8.7* 8.5*   Liver Function Tests:  Recent Labs Lab 03/14/15 1051  AST 21  ALT 12*  ALKPHOS 85  BILITOT 0.4  PROT 6.1*  ALBUMIN 3.5   CBC:  Recent Labs Lab 03/14/15 1051 03/15/15 0335 03/16/15 0423 03/17/15 0600  WBC 14.3* 7.9 12.8* 12.8*  NEUTROABS 13.3*  --   --   --   HGB 13.5 12.0* 11.9* 8.6*  HCT 41.6 37.1* 37.2* 26.2*  MCV 85.4 86.9 86.3 85.9  PLT 224 211 214 191    SIGNED: Time coordinating discharge: 30 minutes  MAGICK-MYERS, ISKRA, MD  [facility] 03/17/2015, 3:38 PM Pager [phone]  If 7PM-7AM, please contact night-coverage [email or web address] Password [email or web address]

## 2015-03-17 NOTE — Evaluation (Signed)
Physical Therapy Evaluation Patient Details Name: [name] MRN: [id number] DOB: 1930-08-29 Today's Date: 03/17/2015   History of Present Illness  81 yo male with PMHx:  L hip fracture had gone home from SNF and fell at home unwitnessed.  ORIF L humerus, ORIF revision L hip.  NWB on LUE and PWB on LLE  Clinical Impression  Pt was seen for initial mobility with pain and confusion issues, and tolerating minimal mobility initially as well.  Has revised ORIF with fracture near TKA on LLE, NWB on L humeral fracture.  Pt able to be assisted to bedside but has poor trunk control to get to stand. Pt is being reminded continually not to use L arm in [place].    Follow Up Recommendations SNF    Equipment Recommendations  None recommended by PT (Has level home with ramped entrance, all the equipment )    Recommendations for Other Services OT consult     Precautions / Restrictions Precautions Precautions: Fall Restrictions Weight Bearing Restrictions: Yes LUE Weight Bearing: Non weight bearing LLE Weight Bearing: Partial weight bearing LLE Partial Weight Bearing Percentage or Pounds: 25%      Mobility  Bed Mobility Overal bed mobility: Needs Assistance;+2 for physical assistance;+ 2 for safety/equipment Bed Mobility: Supine to Sit;Sit to Supine     Supine to sit: Max assist;Mod assist;+2 for physical assistance;+2 for safety/equipment Sit to supine: Mod assist;Max assist;+2 for physical assistance;+2 for safety/equipment   General bed mobility comments: Pt cannot use L side at all to help the transition  Transfers                 General transfer comment: unable due to limited tolerance for mobility and inabiltiy to use LUE  Ambulation/Gait             General Gait Details: unable  Stairs            Wheelchair Mobility    Modified Rankin (Stroke Patients Only)       Balance Overall balance assessment: Needs assistance;History of  Falls Sitting-balance support: Feet supported;Single extremity supported Sitting balance-Leahy Scale: Poor                                       Pertinent Vitals/Pain Pain Assessment: 0-10 Pain Score: 7  Pain Location: L hip with movement Pain Descriptors / Indicators: Cramping;Aching Pain Intervention(s): Premedicated before session;Repositioned    Home Living Family/patient expects to be discharged to:: Skilled nursing facility                      Prior Function Level of Independence: Needs assistance   Gait / Transfers Assistance Needed: assisted with RW or transfer to the chair  ADL's / Homemaking Assistance Needed: wife has been assisting since his hip fx in September with bathing, dressing.         Hand Dominance   Dominant Hand: Left    Extremity/Trunk Assessment   Upper Extremity Assessment: LUE deficits/detail       LUE Deficits / Details: plating on L humeral fracture with elbow at 90 deg bend   Lower Extremity Assessment: LLE deficits/detail   LLE Deficits / Details: upper femoral ORIF with newer ORIF from fall onto L side, has fracture near TKA  Cervical / Trunk Assessment: Kyphotic  Communication   Communication: HOH  Cognition Arousal/Alertness: Lethargic Behavior During Therapy: Anxious Overall  Cognitive Status: History of cognitive impairments - at baseline       Memory: Decreased recall of precautions;Decreased short-term memory              General Comments General comments (skin integrity, edema, etc.): Pt has been up in bed with poor control of his trunk and unable to remember not to use LUE for support.  Will anticpate his need for SNF as he is 2 person assist for mobility     Exercises        Assessment/Plan    PT Assessment Patient needs continued PT services  PT Diagnosis Generalized weakness   PT Problem List Decreased strength;Decreased range of motion;Decreased activity tolerance;Decreased  balance;Decreased mobility;Decreased coordination;Decreased cognition;Decreased safety awareness;Decreased knowledge of precautions;Decreased skin integrity;Pain  PT Treatment Interventions DME instruction;Gait training;Functional mobility training;Therapeutic activities;Therapeutic exercise;Balance training;Neuromuscular re-education;Cognitive remediation;Patient/family education   PT Goals (Current goals can be found in the Care Plan section) Acute Rehab PT Goals Patient Stated Goal: to get up to walk  PT Goal Formulation: With patient/family Time For Goal Achievement: 03/31/15 Potential to Achieve Goals: Fair    Frequency Min 3X/week   Barriers to discharge Inaccessible home environment;Decreased caregiver support wife with pt who cannot assist him    Co-evaluation               End of Session   Activity Tolerance: Patient limited by pain;Patient limited by fatigue Patient left: in bed;with call bell/phone within reach;with bed alarm set;with family/visitor present;with nursing/sitter in room Nurse Communication: Mobility status;Patient requests pain meds         Time: 1040-1105 PT Time Calculation (min) (ACUTE ONLY): 25 min   Charges:   PT Evaluation $Initial PT Evaluation Tier I: 1 Procedure PT Treatments $Therapeutic Activity: 8-22 mins   PT G Codes:        Ramond Dial Mar 28, 2015, 1:22 PM   Mee Hives, PT MS Acute Rehab Dept. Number: [facility] [phone] and [place] [phone]

## 2015-03-17 NOTE — Progress Notes (Signed)
Patient ID: [name], male   DOB: 1930-08-05, 81 y.o.   MRN: [id number] Subjective: 1 Day Post-Op Procedure(s) (LRB): OPEN REDUCTION INTERNAL FIXATION (ORIF) DISTAL RADIAL FRACTURE (Left) OPEN REDUCTION INTERNAL FIXATION (ORIF) DISTAL FEMUR FRACTURE (Left)    Patient comfortable in bed this am.  Wife in chair at bedside.  No events reported  Objective:   VITALS:   Filed Vitals:   03/16/15 2138 03/17/15 0517  BP: 96/74 125/78  Pulse: 99 88  Temp:  98.3 F (36.8 C)  Resp: 9 12    Incision: dressing C/D/I  Recent stroke effecting left side.  Moves toes distally  Left upper extremity deferred to [place]  03/15/15 0335 03/16/15 0423 03/17/15 0600  HGB 12.0* 11.9* 8.6*  HCT 37.1* 37.2* 26.2*  WBC 7.9 12.8* 12.8*  PLT 211 214 191     Recent Labs  03/15/15 0335 03/16/15 0423 03/17/15 0600  NA 142 138 139  K 4.2 3.9 4.0  BUN 23* 27* 29*  CREATININE 1.10 1.07 1.05  GLUCOSE 105* 123* 129*     Recent Labs  03/14/15 1712  INR 1.10     Assessment/Plan: 1 Day Post-Op Procedure(s) (LRB): OPEN REDUCTION INTERNAL FIXATION (ORIF) DISTAL RADIAL FRACTURE (Left) OPEN REDUCTION INTERNAL FIXATION (ORIF) DISTAL FEMUR FRACTURE (Left)   Advance diet Discharge to SNF when medically stable  Would consider stopping Lovenox and just use Plavix as DVT prophylaxis as well as for stroke history Weight bearing on left side going to pose a major issue for him Decubitus precautions Respiratory precautions to try and prevent pneumonia

## 2015-03-17 NOTE — Op Note (Signed)
NAMEMarland Kitchen  Christopher, Wilkinson NO.:  [id number]  MEDICAL RECORD NO.:  [id number]  LOCATION:  [id number]                        FACILITY:  [facility]  PHYSICIAN:  Pietro Cassis. Alvan Dame, M.D.  DATE OF BIRTH:  January 23, 1931  DATE OF PROCEDURE:  03/16/2015 DATE OF DISCHARGE:                              OPERATIVE REPORT   PREOPERATIVE DIAGNOSES: 1. Left distal periprosthetic femur fracture. 2. Left comminuted distal radius fracture, treated by Dr. Iran Planas.  POSTOPERATIVE DIAGNOSES: 1. Left distal periprosthetic femur fracture. 2. Left comminuted distal radius fracture, treated by Dr. Iran Planas.  PROCEDURE:  Open reduction and internal fixation of left distal periprosthetic femur fracture.  COMPONENTS USED:  13 hole left NCB distal femoral plate from [employer] utilizing 4 cortical screws proximal and 4 screws distal with a single 16-gauge wire around the comminuted oblique fracture of the metadiaphyseal region.  SURGEON:  Pietro Cassis. Alvan Dame, M.D.  ASSISTANT:  Danae Orleans, PA-C.  ANESTHESIA:  General.  SPECIMENS:  None.  COMPLICATIONS:  None.  BLOOD LOSS:  Probably about 300 mL.  INDICATION:  [name] is an 81 year old male.  He is known to me with a recent open reduction and internal fixation of left intertrochanteric femur fracture in September of this year.  He unfortunately had a ground level fall, was found by family.  He had pain in his left wrist and left knee and limiting bear weight.  He was brought to the emergency room, where radiographs revealed the above findings.  He was admitted to the Medical Service and was initially consulted.  Our practice was counseled based on a recent involvement. Dr. Iran Planas and myself were asked to assume definitive care of the afore mentioned fractures.  Risks, benefits, and necessity of the procedure discussed with the family.  Consent was obtained for both procedures.  One to Dr. Caralyn Guile and the distal  periprosthetic fracture by me.  Consent was obtained.  PROCEDURE IN DETAIL:  The patient was brought to operative theater. Once adequate anesthesia, preoperative antibiotics, Ancef administered, he was positioned supine.  His left groin was pre-draped out with 10/15 drape into the perineum.  At this point, both his left lower and left upper extremities were prepped and draped in a sterile fashion.  Time- out was performed identifying the patient, planned procedures, and the extremities.  These procedures done simultaneously with a left distal radius fracture fixed by Dr. Iran Planas at the same time and dictated in a separate operative note.  As for the left lower extremity, we identified landmarks under fluoroscopic imaging and created a lateral based incision.  I decided to make an open incision at the fracture site so I could open and reduce this into a near anatomic position.  Once it was reduced and held by a clamp, I placed two 16 wires to hold this in place to maintain the length of the fracture site.  I then selected 13 hole plate to bypass the tip of the previously placed intramedullary nail to prevent stress around there.  In order to do this, however, had to remove the distal interlock screw. The distal interlocking screw was  removed without complication through a separate stab incision.  Once I had the plate oriented and confirmed its location distally and palpably and visually along the center portion of the femur laterally. I placed a guidewire to hold it in place and then obtained radiographs to confirm orientation.  I then placed 1 distal cancellous screw in the metaphysis above his total knee arthroplasty.  Once this was in place, again palpably felt to be anatomically aligned.  Along the lateral side of the femur, I placed 4 cortical screws into the proximal fracture site and distal to the previously placed intramedullary nail.  Once this was done, I did do an  AP and lateral, confirmed the fracture reduction as well as orientation of the plate in AP and lateral planes. I then placed 3 other screws in the distal aspect of the femur, 1 in the metaphysis and 1 in the metaphyseal region distal to the fracture site.  At the conclusion, we obtained radiographs in AP and lateral planes. The distal wire that placed was loose.  I went ahead and removed that, more proximal wire held the fragment and placed, thus I kept it and tightened it and cut it and twisted it down to the plate.  At this point, I irrigated the wound.  The vastus lateralis was laid over top of the plate, and the iliotibial band was reapproximated using a combination of #1 Vicryl and 0 V-Loc sutures.  The remaining wound was closed in layers with 2-0 Vicryl, and staples on the skin included the stab wound and removed the distal interlock from the nail.  Dr. Caralyn Guile and I finished of the case around the same time.  The left leg was cleaned, dried, and dressed sterilely using Mepilex dressing.  The left arm was placed in a splint.  The patient will be taken to the recovery room in stable condition, tolerating the procedure well.  He will be nonweightbearing in his left upper extremity.  His left lower extremity will be touch down, weightbearing for transfers only.  This would represent a very complex problem for this 81 year old male, already with the recent intertrochanteric femur fracture as well as history of dementia.     Pietro Cassis Alvan Dame, M.D.     MDO/MEDQ  D:  03/16/2015  T:  03/17/2015  Job:  [id number]

## 2015-03-18 DIAGNOSIS — I519 Heart disease, unspecified: Secondary | ICD-10-CM

## 2015-03-18 DIAGNOSIS — I5032 Chronic diastolic (congestive) heart failure: Secondary | ICD-10-CM

## 2015-03-18 DIAGNOSIS — S72142F Displaced intertrochanteric fracture of left femur, subsequent encounter for open fracture type IIIA, IIIB, or IIIC with routine healing: Secondary | ICD-10-CM

## 2015-03-18 DIAGNOSIS — D62 Acute posthemorrhagic anemia: Secondary | ICD-10-CM

## 2015-03-18 LAB — BASIC METABOLIC PANEL
Anion gap: 6 (ref 5–15)
BUN: 29 mg/dL — AB (ref 6–20)
CHLORIDE: 102 mmol/L (ref 101–111)
CO2: 28 mmol/L (ref 22–32)
CREATININE: 0.97 mg/dL (ref 0.61–1.24)
Calcium: 8 mg/dL — ABNORMAL LOW (ref 8.9–10.3)
Glucose, Bld: 109 mg/dL — ABNORMAL HIGH (ref 65–99)
Potassium: 3.8 mmol/L (ref 3.5–5.1)
SODIUM: 136 mmol/L (ref 135–145)

## 2015-03-18 LAB — CBC
HCT: 21.5 % — ABNORMAL LOW (ref 39.0–52.0)
HEMOGLOBIN: 7 g/dL — AB (ref 13.0–17.0)
MCH: 27.7 pg (ref 26.0–34.0)
MCHC: 32.6 g/dL (ref 30.0–36.0)
MCV: 85 fL (ref 78.0–100.0)
PLATELETS: 210 10*3/uL (ref 150–400)
RBC: 2.53 MIL/uL — AB (ref 4.22–5.81)
RDW: 13.4 % (ref 11.5–15.5)
WBC: 8.7 10*3/uL (ref 4.0–10.5)

## 2015-03-18 LAB — PREPARE RBC (CROSSMATCH)

## 2015-03-18 LAB — ABO/RH: ABO/RH(D): O POS

## 2015-03-18 MED ORDER — FUROSEMIDE 10 MG/ML IJ SOLN
20.0000 mg | Freq: Once | INTRAMUSCULAR | Status: AC
  Administered 2015-03-18: 20 mg via INTRAVENOUS
  Filled 2015-03-18: qty 2

## 2015-03-18 MED ORDER — SODIUM CHLORIDE 0.9 % IV SOLN: Freq: Once | INTRAVENOUS | Status: DC

## 2015-03-18 NOTE — Progress Notes (Signed)
PROGRESS NOTE  [name] [id number] DOB: Feb 26, 1931 DOA: 1March 21, 202016 PCP: Elsie Stain, MD  HPI/Recap of past 24 hours: 81 y.o. male past medical history that includes stroke last month with moderate left-sided hemiparesis, BPH, hypertension, hypothyroidism, bilateral knee replacements, presented to the emergency department after a fall with chief complaint of left arm/wrist pain, left leg pain. Initial evaluation in the emergency room department revealed left distal femur and left distal radial fracture. Ortho consulted.   Please note that patient was recently hospitalized for acute right MCA infarction that left him with residual left-sided weakness. Echocardiogram at that time noted EF of 65% with grade 1 diastolic CHF, carotid Dopplers 02/10/2015 showed bilateral 1-39% ICA stenosis. Patient was started on Plavix per neurology team. Inpatient rehabilitation has been recommended at that time but due to financial expenses, family plans to take patient home with home health services. Hemoglobin on 12/14 down to 7 and patient transfused 2 units packed red blood cells.  Patient himself today and okay. No complaints.  Assessment/Plan: Principal Problem:   Intertrochanteric fracture of left femur [facility]): Status post repair Active Problems:   Barrett's esophagus   Recent right MCA stroke ([place]): Stable. Be started on Plavix post surgery   Essential hypertension   Lewy body dementia without behavioral disturbance: Stable. No MRI done in November actually noted signs of vascular dementia   Left radial fracture   Leukocytosis: Stress margination. Resolved.   Chronic diastolic CHF (congestive heart failure), NYHA class 1 ([place]): Stable. Remaining euvolemic   Chronic constipation, unspecified : On bowel regimen   Acute blood loss anemia, post operative    Code Status: Partial code  Family Communication: Wife at the bedside  Disposition Plan: Discharge  tomorrow   Consultants:  Orthopedic surgery   Procedures:  Status post ORIF done 12/12   Antibiotics:  Rocephin 12/12-present   Objective: BP 131/76 mmHg  Pulse 75  Temp(Src) 98.9 F (37.2 C) (Oral)  Resp 16  SpO2 94%  Intake/Output Summary (Last 24 hours) at 03/18/15 1558 Last data filed at 03/18/15 1310  Gross per 24 hour  Intake    700 ml  Output      0 ml  Net    700 ml   There were no vitals filed for this visit.  Exam:   General:  Alert and oriented 2, acute distress   Cardiovascular: Regular rate and rhythm, S1-S2   Respiratory: Clear to auscultation bilaterally   Abdomen: Soft, nontender, nondistended, positive bowel sounds   Musculoskeletal: No clubbing or cyanosis or edema    Data Reviewed: Basic Metabolic Panel:  Recent Labs Lab 03/14/15 1051 03/15/15 0335 03/16/15 0423 03/17/15 0600 03/18/15 0510  NA 140 142 138 139 136  K 3.8 4.2 3.9 4.0 3.8  CL 105 105 102 102 102  CO2 27 31 29 29 28   GLUCOSE 131* 105* 123* 129* 109*  BUN 20 23* 27* 29* 29*  CREATININE 1.03 1.10 1.07 1.05 0.97  CALCIUM 9.0 8.9 8.7* 8.5* 8.0*   Liver Function Tests:  Recent Labs Lab 03/14/15 1051  AST 21  ALT 12*  ALKPHOS 85  BILITOT 0.4  PROT 6.1*  ALBUMIN 3.5   No results for input(s): LIPASE, AMYLASE in the last 168 hours. No results for input(s): AMMONIA in the last 168 hours. CBC:  Recent Labs Lab 03/14/15 1051 03/15/15 0335 03/16/15 0423 03/17/15 0600 03/18/15 0510  WBC 14.3* 7.9 12.8* 12.8* 8.7  NEUTROABS 13.3*  --   --   --   --  HGB 13.5 12.0* 11.9* 8.6* 7.0*  HCT 41.6 37.1* 37.2* 26.2* 21.5*  MCV 85.4 86.9 86.3 85.9 85.0  PLT 224 211 214 191 210   Cardiac Enzymes:   No results for input(s): CKTOTAL, CKMB, CKMBINDEX, TROPONINI in the last 168 hours. BNP (last 3 results) No results for input(s): BNP in the last 8760 hours.  ProBNP (last 3 results) No results for input(s): PROBNP in the last 8760 hours.  CBG: No results for  input(s): GLUCAP in the last 168 hours.  Recent Results (from the past 240 hour(s))  Surgical pcr screen     Status: Abnormal   Collection Time: 03/16/15  8:34 AM  Result Value Ref Range Status   MRSA, PCR POSITIVE (A) NEGATIVE Final   Staphylococcus aureus POSITIVE (A) NEGATIVE Final    Comment:        The Xpert SA Assay (FDA approved for NASAL specimens in patients over 53 years of age), is one component of a comprehensive surveillance program.  Test performance has been validated by [facility] for patients greater than or equal to 88 year old. It is not intended to diagnose infection nor to guide or monitor treatment.   Urine culture     Status: None (Preliminary result)   Collection Time: 03/17/15  1:50 PM  Result Value Ref Range Status   Specimen Description URINE, RANDOM  Final   Special Requests NONE  Final   Culture TOO YOUNG TO READ  Final   Report Status PENDING  Incomplete     Studies: No results found.  Scheduled Meds: . sodium chloride   Intravenous Once  . cefTRIAXone (ROCEPHIN)  IV  1 g Intravenous Q24H  . clopidogrel  75 mg Oral Daily  . docusate sodium  100 mg Oral BID  . furosemide  20 mg Intravenous Once  . mupirocin ointment  1 application Nasal BID  . pantoprazole  40 mg Oral Daily  . senna-docusate  1 tablet Oral BID    Continuous Infusions:    Time spent:  15 minutes  [facility] Pager [phone] . If 7PM-7AM, please contact night-coverage at [email or web address], password [facility] 03/18/2015, 3:58 PM  LOS: 4 days

## 2015-03-18 NOTE — Clinical Social Work Note (Addendum)
CSW spoke to patient and his wife regarding SNF placement.  Patient's wife asked CSW to find out if patient is in his copay days for SNF placement.  CSW contacted patient's insurance company who confirmed patient is in his copay days and he would have to start paying $160 per day.  Patient's wife stated that she cannot afford the copays at the SNF right now, and would like to take patient back home with home health.  CSW informed physician and case manager who will make arrangements for patient to have home health.  CSW requested that physician order home health social worker to help patient with transportation needs, possible SNF placement in the future, and to discuss if patient will be able to apply for Medicaid in the future,patient's wife was agreeable to home health social worker.  CSW was informed by patient's wife that she can get some extra help in the home.  Patient's wife stated she will need ambulance transport to help patient get home once he is medically ready for discharge and orders have been received, CSW to continue to follow patient's progress.  Jones Broom. [place], MSW, [place] 03/18/2015 2:27 PM

## 2015-03-18 NOTE — Care Management Important Message (Signed)
Important Message  Patient Details  Name: [name] MRN: [id number] Date of Birth: June 04, 1930   Medicare Important Message Given:  Yes    [name] [place] 03/18/2015, 10:21 AM

## 2015-03-19 DIAGNOSIS — K5900 Constipation, unspecified: Secondary | ICD-10-CM

## 2015-03-19 DIAGNOSIS — S72142D Displaced intertrochanteric fracture of left femur, subsequent encounter for closed fracture with routine healing: Secondary | ICD-10-CM

## 2015-03-19 LAB — TYPE AND SCREEN
ABO/RH(D): O POS
ANTIBODY SCREEN: NEGATIVE
UNIT DIVISION: 0
Unit division: 0

## 2015-03-19 LAB — URINE CULTURE

## 2015-03-19 LAB — HEMOGLOBIN AND HEMATOCRIT, BLOOD
HEMATOCRIT: 28.4 % — AB (ref 39.0–52.0)
Hemoglobin: 9.8 g/dL — ABNORMAL LOW (ref 13.0–17.0)

## 2015-03-19 NOTE — Care Management (Signed)
Case manager notified by patient's wife that hospital bed has been delivered to their home. Case manager contacted social worker to arrange for EMS to  Transport patient  home.

## 2015-03-19 NOTE — Clinical Social Work Note (Signed)
CSW contacted [facility] to set up Ambulance transport.  CSW notified patient's nurse that transportation has been scheduled.  CSW to sign off please reconsult if other social work needs arise.  Jones Broom. [place], MSW, [place] 03/19/2015 2:51 PM

## 2015-03-19 NOTE — Consult Note (Signed)
   [facility] Inpatient Consult   03/19/2015  Zahn Shaak Hight 1930-05-07 [id number] Patient evaluated for community based chronic disease management services with [place] [facility] as a benefit of patient's [facility] Medicare Insurance. Spoke with patient at bedside to explain [place] Management services.  Patient states that it is best to speak with his wife.  He states, "I know I haven't seen her today but you can call her.  I don't know her cell phone number." Patient states, "her name is [name]"   Checked EPIC for contact information.  Called [phone].  HIPPA verified.  [name] states that she was at home awaiting a hospital bed to arrive for the patient.  She states that her husband has not been out of the last skilled facility long enough to qualify for another 20-day stay without the co-pays being $160.00 a day.  She states, "we simply can't afford that, so I am waiting now for the hospital bed to arrive.  She verbally consented to [place] Management services.  Plan to have [facility] social worker and community nurse to follow up for transition of care needs. Patient will receive post hospital discharge call and will be evaluated for monthly home visits for assessments and disease process education.  Left contact information and [facility] brochure at bedside. Made Inpatient Case Manager aware that [place] Management following. Of note, [facility] Care Management services does not replace or interfere with any services that are arranged by inpatient case management or social work.  For additional questions or referrals please contact:   Natividad Brood, RN BSN [facility] Liaison  [phone] business mobile phone

## 2015-03-19 NOTE — Progress Notes (Signed)
Physical Therapy Treatment Patient Details Name: [name] MRN: [id number] DOB: 1930-08-21 Today's Date: 03/19/2015    History of Present Illness 81 y.o. male past medical history that includes stroke in Nov with moderate left-sided hemiparesis, BPH, hypertension, hypothyroidism, bilateral knee replacements ORIF of hip in September of this year presented after fall at home with L radius and distal femur fx. Pt s/p ORIF of Left radius and femur on 12/12    PT Comments    Pt confused and disoriented to place, time and situation. Pt able to follow simple commands at times but very inconsistent and lacks any reasoning or rationale for need for mobility. Pt at times stating he wants to go home but then confused as to where he is and how he would get there. Pt stated "if I had a gun, I would shoot myself and end it all", RN made aware. Attempted redirection, reassurance and calming throughout with pt able to successfully participate in bil LE exercises in bed. Will continue to attempt to follow and progress mobility. Continue to highly recommend SNF and 24 hr care.   Follow Up Recommendations  SNF     Equipment Recommendations       Recommendations for Other Services       Precautions / Restrictions Precautions Precautions: Fall Restrictions LUE Weight Bearing: Non weight bearing LLE Weight Bearing: Partial weight bearing LLE Partial Weight Bearing Percentage or Pounds: 25    Mobility  Bed Mobility Overal bed mobility: Needs Assistance Bed Mobility: Supine to Sit     Supine to sit: Mod assist;+2 for physical assistance Sit to supine: Min guard   General bed mobility comments: Pt with assist to move LLE to EOB and elevate trunk. Pt resistant with trunk throughout and required mod-max assist for anterior translation of trunk. Pt once EOB denied attempting anything else and was able to return to supine with only guarding to prevent weight bearing on LUE. Tried to attempt mobility  again but pt denied  Transfers Overall transfer level:  (pt denied, agitation and anxiety)                  Ambulation/Gait                 Stairs            Wheelchair Mobility    Modified Rankin (Stroke Patients Only)       Balance                                    Cognition Arousal/Alertness: Awake/alert Behavior During Therapy: Anxious Overall Cognitive Status: No family/caregiver present to determine baseline cognitive functioning Area of Impairment: Orientation;Attention;Following commands;Safety/judgement;Awareness     Memory: Decreased short-term memory Following Commands: Follows one step commands inconsistently Safety/Judgement: Decreased awareness of safety;Decreased awareness of deficits     General Comments: pt disoriented to place, time, situation, and unable to consistently follow commands. pt lacks rationale to understand need for mobility or implications for home    Exercises General Exercises - Lower Extremity Heel Slides: AROM;AAROM;Both;10 reps;Supine (AAROM on LLE) Hip ABduction/ADduction: AROM;AAROM;Both;10 reps;Supine (AAROM on LLE)    General Comments        Pertinent Vitals/Pain Pain Assessment: Faces Faces Pain Scale: Hurts little more Pain Location: left hip movement at times-inconsistent    Home Living  Prior Function            PT Goals (current goals can now be found in the care plan section) Progress towards PT goals: Not progressing toward goals - comment (secondary to cognition)    Frequency       PT Plan Current plan remains appropriate    Co-evaluation             End of Session   Activity Tolerance: Treatment limited secondary to agitation Patient left: in bed;with call bell/phone within reach;with bed alarm set     Time: [id number] PT Time Calculation (min) (ACUTE ONLY): 16 min  Charges:  $Therapeutic Activity: 8-22 mins                     G Codes:      Melford Aase Mar 31, 2015, 12:59 PM  Elwyn Reach, [place]

## 2015-03-19 NOTE — Discharge Summary (Signed)
Physician Discharge Summary  ZETH SODERGREN [id number] DOB: 07-05-1930 DOA: 111/01/202016  PCP: Elsie Stain, MD D/C summary routed to PCP  Admit date: 111/01/202016 Discharge date: 03/18/2015  Recommendations for Outpatient Follow-up:  1. Pt will need to follow up with PCP in 1-2 weeks post discharge 2. Pt also needs to see Dr. Alvan Dame in 2 weeks post discharge, orthopedic specialist, please call to schedule an appointment with him  3. Please obtain BMP to evaluate electrolytes and kidney function 4. Please also check CBC to evaluate Hg and Hct levels 5. Pt to continue taking Cipro for 4 more days post discharge to complete therapy for UTI (this will complete total of 7 days treatment) 6. Please also note that Dr. Alvan Dame recommended continuing Plavix which pt was on prior to admission given recent stroke and this will be sufficient for DVT prophylaxis as well    Discharge Diagnoses:  Principal Problem:   Intertrochanteric fracture of left femur ([place]) Active Problems:   Barrett's esophagus   Recent right MCA stroke ([place])   Essential hypertension   Lewy body dementia without behavioral disturbance   Left radial fracture   Leukocytosis   Chronic diastolic CHF (congestive heart failure), NYHA class 1 (HCC)   Chronic constipation, unspecified    Acute blood loss anemia, post operative   Discharge Condition: Stable  Diet recommendation: Heart healthy diet discussed in details   Brief HPI: 81 y.o. male past medical history that includes stroke last month with moderate left-sided hemiparesis, BPH, hypertension, hypothyroidism, bilateral knee replacements, presented to the emergency department after a fall with chief complaint of left arm/wrist pain, left leg pain. Initial evaluation in the emergency room department revealed left distal femur and left distal radial fracture. Ortho consulted.   Please note that patient was recently hospitalized for acute right MCA infarction that left him with  residual left-sided weakness. Echocardiogram at that time noted EF of 65% with grade 1 diastolic CHF, carotid Dopplers 02/10/2015 showed bilateral 1-39% ICA stenosis. Patient was started on Plavix per neurology team. Inpatient rehabilitation has been recommended at that time but due to financial expenses, family took patient home with home health PT.  HOSPITAL COURSE: Principal Problem:  Left periprosthetic femur/knee fracture - please note that per NSQIP risk stratification, pt was determined to be at least moderate risk for surgical intervention given age, underlying HTN and hypertensive cerebrovascular disease, recent R MCA stroke 02/09/2015 (pt on Plavix) - pt is now s/p ORIF DISTAL RADIAL FRACTURE (Left) - Dr. Henderson [place] and s/p ORIF distal femur fx (Left) - Dr. Alvan Dame, post op day #1 - pt reports feeling better, pain is minimal in arm and LE, 2/10 and mostly with exertion - continue with PT/OT as pt able to tolerate  - placement in progress, SNF recommended, but patient's wife states they cannot afford this. Home health including social work set up.  Active Problems:  Left distal metaphysial radial fracture - management per ortho team - s/p ORIF, post day #1 - continue to provide analgesia as needed so that pt can participate in OT   Acute blood loss anemia Secondary to surgery. Hemoglobin dropped to as low as 7.0 on 12/14. Patient transfused 2 units packed red blood cells. Hemoglobin the following day at 9.8.   Leukocytosis - Patient reported some dysuria and urinary urgency 12/12 and was started on empiric Rocephin - UA 12/12 was suggestive of UTI but urine cultures have not been collected - continue Rocephin day #2/7, pt can be transitioned to  oral Cipro upon discharge  - follow upon urine cultures    Lewy body dementia without behavioral disturbance - stable mental status per wife who is at bedside  - pt continues to demonstrate intermittent confusion, high risk for post op and  hospital induced delirium  - Please note that recent MRI 02/09/2015 was notable for advanced atrophy, numerous micro-bleeds representing sequelae of hypertensive cerebrovascular disease - plan to d/c SNF in AM if bed available    Recent R MCA stroke - 02/10/2015 - started on Plavix at that time and this will be continued upon discharge    Barrett's esophagus - stable  - continue Protonix IV for now until post op and pt able to tolerate PO    Constipation - place on bowel regimen, Senokot twice a day and milk of magnesia as needed   Essential hypertension - reasonable inpatient control - continue Norvasc upon discharge    Chronic diastolic CHF, grade I  - per last 2 D ECHO 02/10/2015 - euvolemic for now - IVF pre op - weights 70 kg this AM - monitor daily weights, strict I/O  DVT prophylaxis - Plavix   Code Status: partial code, no intubation and CPR, otherwise OK with pressors  Family Communication: plan of care discussed with the patient and wife at bedside  Disposition Plan: SNF in AM if bed available    Procedures and diagnostic studies:   Dg Ribs Unilateral W/chest Left 12020-11-2414 No demonstrable rib fracture. No pneumothorax. Mild left base atelectasis. Lungs elsewhere clear.   Dg Wrist 2 Views Left 03/15/2015 Recent casting of distal radial and ulnar fracture.   Dg Wrist Complete Left 12020-11-2414 Minimally comminuted Fracture of the distal LEFT radial metaphysis with ventral angulation. 2. Ulnar styloid fracture.   Dg Knee 1-2 Views Left 12020-11-2414 Spiral fracture distal femoral diaphysis, proximal to the total knee prosthesis. There is posterior and medial displacement of the distal fracture fragment with respect to the proximal fragment. No dislocation. There is a joint effusion. Total knee replaced prosthetic components appear well seated.  Dg Hip Unilat With Pelvis 2-3 Views Left 12020-11-2414 Postoperative change on the left. Chronic avulsion  of the lesser trochanter on the left. Mild narrowing both hip joints. No demonstrable acute fracture or dislocation.  Dg Femur Min 2 Views Left 12020-11-2414 Acute obliquely oriented minimally displaced fracture involving the distal femoral meta diaphysis with minimal foreshortening but without definitive extension to involve the femoral component of the knee prosthesis. Correlation with dedicated knee radiographs performed earlier same day is recommended. 2. Stable sequela of intra medullary rod and dynamic screw fixation of previously noted comminuted left intertrochanteric femur fracture without definite evidence of hardware failure or loosening or definitive acute superimposed injury / fracture.   Medical Consultants:  Ortho   Other Consultants:  PT OT  IAnti-Infectives:   Rocephin 12/12 --> 12/14 and starting 12/15 pt can take Cipro BID for 4 more days      Discharge Exam: Filed Vitals:   03/19/15 0537 03/19/15 1300  BP: 144/84 146/73  Pulse: 81 86  Temp: 98.7 F (37.1 C) 98.1 F (36.7 C)  Resp: 18 18   Filed Vitals:   03/18/15 2048 03/18/15 2111 03/19/15 0537 03/19/15 1300  BP: 129/61 124/76 144/84 146/73  Pulse: 87 78 81 86  Temp: 99 F (37.2 C) 99.1 F (37.3 C) 98.7 F (37.1 C) 98.1 F (36.7 C)  TempSrc: Oral Oral Oral Oral  Resp: 18 18 18 18   SpO2: 94% 92% 96%  98%    General: Pt is alert, follows commands appropriately, not in acute distress Cardiovascular: Regular rate and rhythm,no rubs, no gallops Respiratory: Clear to auscultation bilaterally, no wheezing, diminished breath sounds at bases  Abdominal: Soft, non tender, non distended, bowel sounds +, no guarding  Discharge Instructions  Discharge Instructions    AMB Referral to [facility]    Complete by:  As directed   Reason for consult:  Post hospital follow up, patient being recommended for skilled nursing  Expected date of contact:  1-3 days (reserved for hospital discharges)  Please  assign to community nurse for transition of care calls and assess for home visits. Please assign social worker for transition of care follow up.  Patient was recently at [facility] for rehab.  Has not been out enough time for the start over of 20-day currently told the co-pay will be $160/day if he goes back.  Wife, [name], verbally consented via telephone [phone] and  has elected to take him home with home health and has had a hospital bed to arrive today. He is [place] and 3rd admission/6 months.  Questions please call:  Natividad Brood, RN BSN [facility]iaison  [phone] business mobile phone     Diet - low sodium heart healthy    Complete by:  As directed      Increase activity slowly    Complete by:  As directed      Touch down weight bearing    Complete by:  As directed   25%            Medication List    TAKE these medications        amLODipine 10 MG tablet  Commonly known as:  NORVASC  Take 1 tablet (10 mg total) by mouth daily.     B-complex with vitamin C tablet  Take 1 tablet by mouth daily.     ciprofloxacin 500 MG tablet  Commonly known as:  CIPRO  Take 1 tablet (500 mg total) by mouth 2 (two) times daily for 4 more days post discharge      clopidogrel 75 MG tablet  Commonly known as:  PLAVIX  Take 1 tablet (75 mg total) by mouth daily.     docusate sodium 100 MG capsule  Commonly known as:  COLACE  Take 1 capsule (100 mg total) by mouth 2 (two) times daily.     donepezil 10 MG tablet  Commonly known as:  ARICEPT  Take 1 tablet (10 mg total) by mouth daily.     escitalopram 5 MG tablet  Commonly known as:  LEXAPRO  Take 1 tablet (5 mg total) by mouth daily.     ferrous sulfate 325 (65 FE) MG tablet  Take 1 tablet (325 mg total) by mouth 2 (two) times daily with a meal.     HYDROcodone-acetaminophen 5-325 MG tablet  Commonly known as:  NORCO  Take 1-2 tablets by mouth every 6 (six) hours as needed.     levothyroxine  100 MCG tablet  Commonly known as:  SYNTHROID, LEVOTHROID  TAKE 1 TABLET EVERY DAY     multivitamin tablet  Take 1 tablet by mouth daily.     omeprazole 40 MG capsule  Commonly known as:  PRILOSEC  Take 40 mg by mouth daily.     ondansetron 4 MG tablet  Commonly known as:  ZOFRAN  Take 1 tablet (4 mg total) by mouth every 6 (six) hours as needed  for nausea.     polyethylene glycol packet  Commonly known as:  MIRALAX / GLYCOLAX  Take 17 g by mouth daily.     PROBIOTIC PO  Take 1 tablet by mouth daily.     terazosin 5 MG capsule  Commonly known as:  HYTRIN  Take 1 capsule (5 mg total) by mouth at bedtime.       Follow-up Information    Follow up with Mauri Pole, MD In 2 weeks.   Specialty:  Orthopedic Surgery   Contact information:   [address] [place] [address] [phone]       Follow up with Elsie Stain, MD.   Specialty:  [facility] Medicine   Contact information:   [address] [place] [address] [phone]        The results of significant diagnostics from this hospitalization (including imaging, microbiology, ancillary and laboratory) are listed below for reference.     Microbiology: Recent Results (from the past 240 hour(s))  Surgical pcr screen     Status: Abnormal   Collection Time: 03/16/15  8:34 AM  Result Value Ref Range Status   MRSA, PCR POSITIVE (A) NEGATIVE Final   Staphylococcus aureus POSITIVE (A) NEGATIVE Final    Comment:        The Xpert SA Assay (FDA approved for NASAL specimens in patients over 34 years of age), is one component of a comprehensive surveillance program.  Test performance has been validated by [facility] for patients greater than or equal to 79 year old. It is not intended to diagnose infection nor to guide or monitor treatment.   Urine culture     Status: None   Collection Time: 03/17/15  1:50 PM  Result Value Ref Range Status   Specimen Description URINE, RANDOM   Final   Special Requests NONE  Final   Culture MULTIPLE SPECIES PRESENT, SUGGEST RECOLLECTION  Final   Report Status 03/19/2015 FINAL  Final     Labs: Basic Metabolic Panel:  Recent Labs Lab 03/14/15 1051 03/15/15 0335 03/16/15 0423 03/17/15 0600 03/18/15 0510  NA 140 142 138 139 136  K 3.8 4.2 3.9 4.0 3.8  CL 105 105 102 102 102  CO2 27 31 29 29 28   GLUCOSE 131* 105* 123* 129* 109*  BUN 20 23* 27* 29* 29*  CREATININE 1.03 1.10 1.07 1.05 0.97  CALCIUM 9.0 8.9 8.7* 8.5* 8.0*   Liver Function Tests:  Recent Labs Lab 03/14/15 1051  AST 21  ALT 12*  ALKPHOS 85  BILITOT 0.4  PROT 6.1*  ALBUMIN 3.5   CBC:  Recent Labs Lab 03/14/15 1051 03/15/15 0335 03/16/15 0423 03/17/15 0600 03/18/15 0510 03/19/15 1100  WBC 14.3* 7.9 12.8* 12.8* 8.7  --   NEUTROABS 13.3*  --   --   --   --   --   HGB 13.5 12.0* 11.9* 8.6* 7.0* 9.8*  HCT 41.6 37.1* 37.2* 26.2* 21.5* 28.4*  MCV 85.4 86.9 86.3 85.9 85.0  --   PLT 224 211 214 191 210  --     SIGNED: Time coordinating discharge: 30 minutes  Annita Brod, MD  [facility] 03/19/2015, 4:39 PM Pager [phone]  If 7PM-7AM, please contact night-coverage [email or web address] Password [email or web address]

## 2015-03-19 NOTE — Consult Note (Signed)
Late entry.  Patient seen 03/15/15.   [name] is an 81 y.o. male.   Chief Complaint: left wrist fracture HPI: 81 yo male present with wife.  His wife believes he fell getting up from bed yesterday. She also notes that he sustained a stroke last month. Seen in ED yesterday where XR revealed left distal radial fracture.   I was consulted by ED staff regarding the distal radius fracture.   Past Medical History  Diagnosis Date  . Hypertension   . Hypothyroidism   . Diverticulosis of colon   . BPH (benign prostatic hypertrophy)   . Pancreatitis 2011    and gallstone pancreatitis and Ecoli bacteremia  . 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 followed by [name]- observation as of 09/2010  . Rabbit fever 1940  . History of shingles   . Dementia   . Pneumonia     2016  . Stroke ([place]) 02/2015    with L sided weakness.     Past Surgical History  Procedure Laterality Date  . Appendectomy  1959  . Thyroidectomy, partial  1966  . Knee arthroscopy  06/1995    left Dr Derrel Nip  . Joint replacement  04/30/2001    bilateral TKR  . Cholecystectomy  10/2009  . Eye muscle surgery      12/12 had double vision Dr. Juleen China at [facility]  . Orif hip fracture Left 12/26/2014    Procedure: OPEN REDUCTION INTERNAL FIXATION HIP;  Surgeon: Paralee Cancel, MD;  Location: [facility];  Service: Orthopedics;  Laterality: Left;  . Open reduction internal fixation (orif) distal radial fracture Left 03/16/2015    Procedure: OPEN REDUCTION INTERNAL FIXATION (ORIF) DISTAL RADIAL FRACTURE;  Surgeon: Iran Planas, MD;  Location: [facility];  Service: Orthopedics;  Laterality: Left;  . Orif femur fracture Left 03/16/2015    Procedure: OPEN REDUCTION INTERNAL FIXATION (ORIF) DISTAL FEMUR FRACTURE;  Surgeon: Paralee Cancel, MD;  Location: [facility];  Service: Orthopedics;  Laterality: Left;    Family History  Problem Relation Age of Onset  . Hypertension Mother   . Stroke Mother   . Alcohol abuse Father   .  Cancer Brother     prostate CA  . Heart disease Brother    Social History:  reports that he has never smoked. He has never used smokeless tobacco. He reports that he does not drink alcohol or use illicit drugs.  Allergies:  Allergies  Allergen Reactions  . Sulfonamide Derivatives Other (See Comments)    Childhood allergy  . Valtrex [Valacyclovir Hcl]     NAV    Medications Prior to Admission  Medication Sig Dispense Refill  . amLODipine (NORVASC) 10 MG tablet Take 1 tablet (10 mg total) by mouth daily. 30 tablet 1  . B Complex-C (B-COMPLEX WITH VITAMIN C) tablet Take 1 tablet by mouth daily. 30 tablet 3  . clopidogrel (PLAVIX) 75 MG tablet Take 1 tablet (75 mg total) by mouth daily. 30 tablet 1  . donepezil (ARICEPT) 10 MG tablet Take 1 tablet (10 mg total) by mouth daily.    Marland Kitchen escitalopram (LEXAPRO) 5 MG tablet Take 1 tablet (5 mg total) by mouth daily. 30 tablet 12  . levothyroxine (SYNTHROID, LEVOTHROID) 100 MCG tablet TAKE 1 TABLET EVERY DAY (Patient taking differently: TAKE 100 MCG BY MOUTH DAILY) 90 tablet 1  . Multiple Vitamin (MULTIVITAMIN) tablet Take 1 tablet by mouth daily.      Marland Kitchen omeprazole (PRILOSEC) 40  MG capsule Take 40 mg by mouth daily.     . ondansetron (ZOFRAN) 4 MG tablet Take 1 tablet (4 mg total) by mouth every 6 (six) hours as needed for nausea. 20 tablet 0  . polyethylene glycol (MIRALAX / GLYCOLAX) packet Take 17 g by mouth daily. (Patient taking differently: Take 17 g by mouth daily as needed. ) 14 each 0  . Probiotic Product (PROBIOTIC PO) Take 1 tablet by mouth daily.    Marland Kitchen terazosin (HYTRIN) 5 MG capsule Take 1 capsule (5 mg total) by mouth at bedtime. 90 capsule 3  . [DISCONTINUED] HYDROcodone-acetaminophen (NORCO/VICODIN) 5-325 MG per tablet Take 1-2 tablets by mouth every 6 (six) hours as needed for moderate pain. 60 tablet 0  . ferrous sulfate 325 (65 FE) MG tablet Take 1 tablet (325 mg total) by mouth 2 (two) times daily with a meal. (Patient not taking:  Reported on 02/18/2015)  3      No results found.   Pertinent items noted in HPI and remainder of comprehensive ROS otherwise negative.  Blood pressure 144/84, pulse 81, temperature 98.7 F (37.1 C), temperature source Oral, resp. rate 18, SpO2 96 %.  General appearance: alert, cooperative and appears stated age Head: Normocephalic, without obvious abnormality, atraumatic Neck: supple, symmetrical, trachea midline Extremities: intact sensation and capillary refill all digits.  +epl/fpl/io.  right ue: no wounds or ttp.  left ue:  sugartong splint in place.  no wounds visible. Pulses: 2+ and symmetric Skin: Skin color, texture, turgor normal. No rashes or lesions Neurologic: Grossly normal Incision/Wound: none  Assessment/Plan Left distal radius fracture.  Discussed with patient and wife the nature of the injury.  Discussed operative and non operative treatment options.  He has been cared for at [facility] for other issues and they wish to keep all of his care in one location.  This is certainly a reasonable desire.  I discussed with them that I am more than happy to provide care for him, but understand the desire to consolidate his care.    Daesha Insco R 03/19/2015, 1:37 PM

## 2015-03-20 ENCOUNTER — Encounter: Payer: Self-pay | Admitting: *Deleted

## 2015-03-20 ENCOUNTER — Other Ambulatory Visit: Payer: Self-pay | Admitting: *Deleted

## 2015-03-20 ENCOUNTER — Other Ambulatory Visit: Payer: Self-pay

## 2015-03-20 NOTE — Patient Outreach (Signed)
Transition of care call (week 1- discharged 12/15):  Spoke with spouse, HIPPA verified on pt.   Pt reports pt is bed bound with fx of left femur/left wrist.  Spouse reports pt  broke his left hip in September, recovered from that.  Spouse reports pt also had a stroke a few weeks past.  Spouse reports Henderson RN came today, was informed pt will not getting HH PT- pt  at a standstill.  Spouse voiced concern about getting pt to MD appointments, pt not able to get into car the way his leg is now.  Spouse reports hoping pt  able to get into a wheel chair by the time MD appointment is. Spouse states she is going to call the orthopedic MD and Primary Care MD offices to schedule f/u visits.   RN CM provided spouse with number for [facility] transportation plus will have [place] SW f/u.   Discussed with spouse plan to f/u with weekly phone calls/home visit as part of ongoing transition of care.  Informed spouse coworker covering for this RN CM will f/u telephonically 12/23, home visit scheduled for 12/28.    Provided spouse with my name and contact number to call if needed in addition to 24 hour nurse advice line.       Zara Chess.   [facility]  [phone]

## 2015-03-25 ENCOUNTER — Encounter (HOSPITAL_COMMUNITY): Payer: Self-pay | Admitting: Orthopedic Surgery

## 2015-03-27 ENCOUNTER — Other Ambulatory Visit: Payer: Self-pay

## 2015-03-27 NOTE — Patient Outreach (Signed)
This RNCM was successful in making contact with patient for community care coordination assessment. Patient's wife consented to speak with this RNCM for this telephone assessment, as she states her husband is not available at this time.  Wife provided information to satisfy HIPPA identifiers by providing date of birth and address.  Wife states patient is complaining of surgical pain, states she has been giving him one pain tablet (hydrocodone) every sick hours.  This RNCM suggested to wife to give patient 2 tablets instead of one every 6 hours until pain is better controlled.    Mrs. Ransford advised to call primary care physician and or surgeon if pain is not controlled better controlled.  Wife states patient has appointment with primary care physician on Thursday of next week.  Mrs. Fiorini states she has called patient's insurance company to appeal their decision to deny physical therapy.  Wife states she is awaiting decision regarding physical therapy.  Mrs. Mcelwee and this RNCM updated patient's case management care.    Plan: Update patient's primary case manager by updating through confidential.

## 2015-03-31 ENCOUNTER — Encounter: Payer: Self-pay | Admitting: Family Medicine

## 2015-03-31 ENCOUNTER — Other Ambulatory Visit: Payer: Self-pay | Admitting: *Deleted

## 2015-03-31 ENCOUNTER — Ambulatory Visit (INDEPENDENT_AMBULATORY_CARE_PROVIDER_SITE_OTHER): Payer: Commercial Managed Care - HMO | Admitting: Family Medicine

## 2015-03-31 VITALS — BP 110/72 | HR 90 | Temp 98.2°F

## 2015-03-31 DIAGNOSIS — D649 Anemia, unspecified: Secondary | ICD-10-CM

## 2015-03-31 DIAGNOSIS — S52502D Unspecified fracture of the lower end of left radius, subsequent encounter for closed fracture with routine healing: Secondary | ICD-10-CM

## 2015-03-31 DIAGNOSIS — D62 Acute posthemorrhagic anemia: Secondary | ICD-10-CM | POA: Diagnosis not present

## 2015-03-31 DIAGNOSIS — S728X2D Other fracture of left femur, subsequent encounter for closed fracture with routine healing: Secondary | ICD-10-CM | POA: Diagnosis not present

## 2015-03-31 LAB — CBC WITH DIFFERENTIAL/PLATELET
BASOS PCT: 0.4 % (ref 0.0–3.0)
Basophils Absolute: 0 10*3/uL (ref 0.0–0.1)
EOS ABS: 0.1 10*3/uL (ref 0.0–0.7)
Eosinophils Relative: 1.5 % (ref 0.0–5.0)
HCT: 37.3 % — ABNORMAL LOW (ref 39.0–52.0)
HEMOGLOBIN: 11.9 g/dL — AB (ref 13.0–17.0)
LYMPHS ABS: 1 10*3/uL (ref 0.7–4.0)
Lymphocytes Relative: 11.6 % — ABNORMAL LOW (ref 12.0–46.0)
MCHC: 32 g/dL (ref 30.0–36.0)
MCV: 88 fl (ref 78.0–100.0)
MONO ABS: 0.5 10*3/uL (ref 0.1–1.0)
Monocytes Relative: 6 % (ref 3.0–12.0)
NEUTROS PCT: 80.5 % — AB (ref 43.0–77.0)
Neutro Abs: 6.8 10*3/uL (ref 1.4–7.7)
PLATELETS: 579 10*3/uL — AB (ref 150.0–400.0)
RBC: 4.24 Mil/uL (ref 4.22–5.81)
RDW: 16.5 % — AB (ref 11.5–15.5)
WBC: 8.4 10*3/uL (ref 4.0–10.5)

## 2015-03-31 LAB — BASIC METABOLIC PANEL
BUN: 28 mg/dL — ABNORMAL HIGH (ref 6–23)
CO2: 26 mEq/L (ref 19–32)
Calcium: 9.4 mg/dL (ref 8.4–10.5)
Chloride: 104 mEq/L (ref 96–112)
Creatinine, Ser: 1.06 mg/dL (ref 0.40–1.50)
GFR: 71.03 mL/min (ref >60.00)
GLUCOSE: 106 mg/dL — AB (ref 70–99)
POTASSIUM: 3.4 meq/L — AB (ref 3.5–5.1)
SODIUM: 140 meq/L (ref 135–145)

## 2015-03-31 NOTE — Patient Outreach (Signed)
[facility]) Care Management  03/31/2015  [name] Calandra 17-Apr-1930 [id number]   Phone call to patient to provide assistance with any transportation needs, per referral from [facility].  HIPPA compliant Voicemail message left for a return call.   Sheralyn Boatman [facility] Care Management [phone]

## 2015-03-31 NOTE — Progress Notes (Signed)
Pre visit review using our clinic review tool, if applicable. No additional management support is needed unless otherwise documented below in the visit note.  L radius fx s/p repair and now back at home.  See below.  No drainage.  No redness.    L femur fx. S/p repair.  Pain is continuing, wife has been given pain med with some relief.  He can usually rest after dosing his med.  Has f/u with ortho pending.  Moving bowels better recently.  Wife wasn't sure about appeal for PT.  He isn't in PT at this point.  She'll ask ortho about that.  Nonweight bearing L leg.  Has adjustable bed at home. Wife has some family help.  Has some help with transport.  No skin breakdown.  Wife is attentive to rolling, skin care. Had a gel pad on the mattress.   UTI.  Done with abx.  Still with some frequency but clearly improved from prev.  No fevers.  Not having the pain with urination like he did prev.    PMH and SH reviewed  ROS: See HPI, otherwise noncontributory.  Meds, vitals, and allergies reviewed.   nad In wheelchair Uncomfortable from fx, as expected.   Mmm OP wnl Neck supple, no LA rrr ctab abd soft Incisions clean and intact w/o spreading erythema.  Distally NV intact in the ext

## 2015-03-31 NOTE — Patient Instructions (Addendum)
We'll contact you with your lab report. I'll await the notes from your ortho appointment.  Let me know if you need help getting PT set up.  Take care.  Glad to see you.

## 2015-04-02 ENCOUNTER — Encounter: Payer: Self-pay | Admitting: Family Medicine

## 2015-04-02 ENCOUNTER — Ambulatory Visit: Payer: Self-pay | Admitting: *Deleted

## 2015-04-02 DIAGNOSIS — S52509A Unspecified fracture of the lower end of unspecified radius, initial encounter for closed fracture: Secondary | ICD-10-CM | POA: Insufficient documentation

## 2015-04-02 DIAGNOSIS — S7292XA Unspecified fracture of left femur, initial encounter for closed fracture: Secondary | ICD-10-CM | POA: Insufficient documentation

## 2015-04-02 NOTE — Assessment & Plan Note (Signed)
He has f/u with ortho pending, continue pain meds as is.

## 2015-04-02 NOTE — Assessment & Plan Note (Signed)
See notes on f/u labs.  With iron replacement ongoing.

## 2015-04-02 NOTE — Assessment & Plan Note (Addendum)
He has f/u with ortho pending, continue pain meds as is.  Wife has some help at home, has appropriate equipment.   Wife will check on PT through ortho and update me as needed.   He is moving his bowels now and is still okay for outpatient f/u.   >25 minutes spent in face to face time with patient, >50% spent in counselling or coordination of care

## 2015-04-03 ENCOUNTER — Encounter: Payer: Self-pay | Admitting: *Deleted

## 2015-04-03 ENCOUNTER — Other Ambulatory Visit: Payer: Self-pay | Admitting: *Deleted

## 2015-04-03 NOTE — Patient Outreach (Signed)
[facility]) Care Management   04/03/2015  [name] [birth year] [id number]  [name] is an 81 y.o. male  Subjective:   Spouse reports pt f/u with Dr. Alvan Dame (left arm fx) and Dr. Caralyn Guile (left leg) yesterday, Dr. Alvan Dame put on a new cast, Dr. Caralyn Guile removed staples.   Spouse reports pt is to have cast on left arm for 2 more weeks.   Spouse reports pt fell 3 times since September, mild stroke in between falls leaving left side a little weak.   Spouse reports MD yesterday put in an order for [facility] PT for patient, to go with  [place] home [facility]  who will be getting in touch with new insurance ([employer]) next week.  Spouse reports giving pt one Hydrocodone every 4 hours for his pain, working.    Objective:   Filed Vitals:   04/03/15 1103  BP: 144/82  Pulse: 78  Resp: 20    ROS  Physical Exam  Constitutional: He appears well-developed and well-nourished.  Cardiovascular: Normal rate, regular rhythm and normal heart sounds.   Respiratory: Effort normal and breath sounds normal.  GI: Soft.  Musculoskeletal: He exhibits edema.  Limited ROM- left arm fracture- cast intact. Non weight bearing left leg.   Edema noted in left knee.   Neurological: He is alert.  Skin: Skin is warm and dry.  Psychiatric: He has a normal mood and affect. His behavior is normal.    Current Medications:  Reviewed with spouse - does pill planner (provided new planner) Current Outpatient Prescriptions  Medication Sig Dispense Refill  . amLODipine (NORVASC) 10 MG tablet Take 1 tablet (10 mg total) by mouth daily. 30 tablet 1  . B Complex-C (B-COMPLEX WITH VITAMIN C) tablet Take 1 tablet by mouth daily. 30 tablet 3  . clopidogrel (PLAVIX) 75 MG tablet Take 1 tablet (75 mg total) by mouth daily. 30 tablet 1  . docusate sodium (COLACE) 100 MG capsule Take 1 capsule (100 mg total) by mouth 2 (two) times daily. 10 capsule 0  . donepezil (ARICEPT) 10 MG tablet Take 1 tablet (10 mg  total) by mouth daily.    Marland Kitchen escitalopram (LEXAPRO) 5 MG tablet Take 1 tablet (5 mg total) by mouth daily. 30 tablet 12  . ferrous sulfate 325 (65 FE) MG tablet Take 1 tablet (325 mg total) by mouth 2 (two) times daily with a meal.  3  . HYDROcodone-acetaminophen (NORCO) 5-325 MG tablet Take 1-2 tablets by mouth every 6 (six) hours as needed. 90 tablet 0  . levothyroxine (SYNTHROID, LEVOTHROID) 100 MCG tablet TAKE 1 TABLET EVERY DAY (Patient taking differently: TAKE 100 MCG BY MOUTH DAILY) 90 tablet 1  . Multiple Vitamin (MULTIVITAMIN) tablet Take 1 tablet by mouth daily.      Marland Kitchen omeprazole (PRILOSEC) 40 MG capsule Take 40 mg by mouth daily.     . polyethylene glycol (MIRALAX / GLYCOLAX) packet Take 17 g by mouth daily. (Patient taking differently: Take 17 g by mouth daily as needed. ) 14 each 0  . Probiotic Product (PROBIOTIC PO) Take 1 tablet by mouth daily.    Marland Kitchen terazosin (HYTRIN) 5 MG capsule Take 1 capsule (5 mg total) by mouth at bedtime. 90 capsule 3  . ondansetron (ZOFRAN) 4 MG tablet Take 1 tablet (4 mg total) by mouth every 6 (six) hours as needed for nausea. (Patient not taking: Reported on 04/03/2015) 20 tablet 0   No current facility-administered medications for this visit.  Functional Status:   In your present state of health, do you have any difficulty performing the following activities: 04/03/2015 02/10/2015  Hearing? Y N  Vision? Y N  Difficulty concentrating or making decisions? Tempie Donning  Walking or climbing stairs? Y Y  Dressing or bathing? Y Y  Doing errands, shopping? Tempie Donning  Preparing Food and eating ? Y -  Using the Toilet? Y -  In the past six months, have you accidently leaked urine? Y -  Do you have problems with loss of bowel control? Y -  Managing your Medications? Y -  Managing your Finances? Y -  Housekeeping or managing your Housekeeping? Y -    Fall/Depression Screening:    PHQ 2/9 Scores 04/03/2015 09/27/2013  PHQ - 2 Score 6 4  PHQ- 9 Score 12 -     Assessment:   Upon arrival, pt in  hospital bed, slept through most of home visit, spouse present.  Safe environment                            Noted.                           Surgical pain (+2 on the pain scale), being managed with pain medication.                           Impaired mobility- pt limited with fx to left arm, left leg, little weakness on left side from mild stroke.                                                           Per spouse, order placed for [facility] PT, [facility] called during home visit.                           HTN- BP today 144/82 (did not take am medications yet)   Plan:  Pt to f/u with Orthopedic MD 04/17/15.            Plan to continue to follow pt for Transition of care, f/u again telephonically 04/10/15.              Plan to inform Dr. Damita Dunnings of [facility] involvement - route today's encounter in [place] as well as send letter by in                 Basket in [place].    THN CM Care Plan Problem One        Most Recent Value   Care Plan Problem One  Risk for readmission related to recent hospitalization left femur and wrist fractures.    Role Documenting the Problem One  Care Management Coordinator   Care Plan for Problem One  Active   THN Long Term Goal (31-90 days)  Pt would not readmit 31 days from day of discharge    [place] Term Goal Start Date  03/20/15   Interventions for Problem One Long Term Goal  patient remains at home in care of wife.  Patient has appointment with primary care physiican on Tuhursday of next week.     THN CM Short Term  Goal #1 (0-30 days)  Pt would f/u with MD within 7-10 days post discharge    THN CM Short Term Goal #1 Start Date  03/20/15   THN CM Short Term Goal #1 Met Date  -- [not met- pt f/u with MD 12 days post discharge ]   Interventions for Short Term Goal #1  Discussed with spouse importance of pt f/u with Primary MD 7-10 days post discharge    THN CM Short Term Goal #2 (0-30 days)  Pt would not have any recurrent falls in the next 30  days    THN CM Short Term Goal #2 Start Date  03/20/15   Interventions for Short Term Goal #2  pt states HH PT ordered- called today, to f/u with pt's insurance next week. Discussed with spouse working with [facility] PT for safe transfers.     Zara Chess.   [place] Care Management  [phone]

## 2015-04-08 DIAGNOSIS — Z96653 Presence of artificial knee joint, bilateral: Secondary | ICD-10-CM | POA: Diagnosis not present

## 2015-04-08 DIAGNOSIS — S52372D Galeazzi's fracture of left radius, subsequent encounter for closed fracture with routine healing: Secondary | ICD-10-CM | POA: Diagnosis not present

## 2015-04-08 DIAGNOSIS — I10 Essential (primary) hypertension: Secondary | ICD-10-CM | POA: Diagnosis not present

## 2015-04-08 DIAGNOSIS — I69352 Hemiplegia and hemiparesis following cerebral infarction affecting left dominant side: Secondary | ICD-10-CM | POA: Diagnosis not present

## 2015-04-08 DIAGNOSIS — Z7902 Long term (current) use of antithrombotics/antiplatelets: Secondary | ICD-10-CM | POA: Diagnosis not present

## 2015-04-08 DIAGNOSIS — Z79891 Long term (current) use of opiate analgesic: Secondary | ICD-10-CM | POA: Diagnosis not present

## 2015-04-08 DIAGNOSIS — S72142D Displaced intertrochanteric fracture of left femur, subsequent encounter for closed fracture with routine healing: Secondary | ICD-10-CM | POA: Diagnosis not present

## 2015-04-08 DIAGNOSIS — Z9181 History of falling: Secondary | ICD-10-CM | POA: Diagnosis not present

## 2015-04-08 DIAGNOSIS — M9702XD Periprosthetic fracture around internal prosthetic left hip joint, subsequent encounter: Secondary | ICD-10-CM | POA: Diagnosis not present

## 2015-04-08 DIAGNOSIS — F0390 Unspecified dementia without behavioral disturbance: Secondary | ICD-10-CM | POA: Diagnosis not present

## 2015-04-10 ENCOUNTER — Other Ambulatory Visit: Payer: Self-pay | Admitting: *Deleted

## 2015-04-10 NOTE — Patient Outreach (Signed)
Transition of care call (week 4, discharged 12/15):  Spoke with spouse, reports HH PT came this week, to see if pt can have [place] aide. Spouse reports pt's pain is the same, c/o of left arm more than left leg.   Spouse reports pt is to f/u with Orthopedic MD next week, hopefully change cast on arm.   Discussed with spouse f/u again telephonically on 1/12 as part of ongoing transition of care to which spouse agreed.     Zara Chess.   [place] Care Management  [phone]

## 2015-04-14 DIAGNOSIS — Z7902 Long term (current) use of antithrombotics/antiplatelets: Secondary | ICD-10-CM | POA: Diagnosis not present

## 2015-04-14 DIAGNOSIS — Z79891 Long term (current) use of opiate analgesic: Secondary | ICD-10-CM | POA: Diagnosis not present

## 2015-04-14 DIAGNOSIS — Z96653 Presence of artificial knee joint, bilateral: Secondary | ICD-10-CM | POA: Diagnosis not present

## 2015-04-14 DIAGNOSIS — S72142D Displaced intertrochanteric fracture of left femur, subsequent encounter for closed fracture with routine healing: Secondary | ICD-10-CM | POA: Diagnosis not present

## 2015-04-14 DIAGNOSIS — Z9181 History of falling: Secondary | ICD-10-CM | POA: Diagnosis not present

## 2015-04-14 DIAGNOSIS — F0390 Unspecified dementia without behavioral disturbance: Secondary | ICD-10-CM | POA: Diagnosis not present

## 2015-04-14 DIAGNOSIS — I69352 Hemiplegia and hemiparesis following cerebral infarction affecting left dominant side: Secondary | ICD-10-CM | POA: Diagnosis not present

## 2015-04-14 DIAGNOSIS — M9702XD Periprosthetic fracture around internal prosthetic left hip joint, subsequent encounter: Secondary | ICD-10-CM | POA: Diagnosis not present

## 2015-04-14 DIAGNOSIS — S52372D Galeazzi's fracture of left radius, subsequent encounter for closed fracture with routine healing: Secondary | ICD-10-CM | POA: Diagnosis not present

## 2015-04-14 DIAGNOSIS — I10 Essential (primary) hypertension: Secondary | ICD-10-CM | POA: Diagnosis not present

## 2015-04-14 DIAGNOSIS — W19XXXD Unspecified fall, subsequent encounter: Secondary | ICD-10-CM | POA: Diagnosis not present

## 2015-04-16 ENCOUNTER — Other Ambulatory Visit: Payer: Self-pay | Admitting: *Deleted

## 2015-04-16 NOTE — Patient Outreach (Signed)
Transition of care call (discharged 12/15):  Spoke with spouse who reports pt was vomiting yesterday, currently sleeping.  Spouse reports pt has had this in the past, no fever, usually gets better the next day.   Spouse reports pt's appetite has been good.  RN CM discussed with spouse importance of keeping pt hydrated with recent vomiting to which she said she would.  Spouse reports pt is to f/u with orthopedic MD tomorrow.  Spouse reports pt's pain is better, been giving him Tylenol but no prescription pain medication in 2 days.   Discussed with spouse plan to do final transition of care call 1/16 to which she agreed.     Zara Chess.   [place] Care Management  [phone]

## 2015-04-17 ENCOUNTER — Other Ambulatory Visit: Payer: Self-pay | Admitting: *Deleted

## 2015-04-17 DIAGNOSIS — S52372D Galeazzi's fracture of left radius, subsequent encounter for closed fracture with routine healing: Secondary | ICD-10-CM | POA: Diagnosis not present

## 2015-04-17 NOTE — Telephone Encounter (Signed)
Last office 03/31/15 See drug warning with Omeprazole. Is it okay to refill?

## 2015-04-17 NOTE — Telephone Encounter (Signed)
[place] await PCP

## 2015-04-19 DIAGNOSIS — S72145D Nondisplaced intertrochanteric fracture of left femur, subsequent encounter for closed fracture with routine healing: Secondary | ICD-10-CM | POA: Diagnosis not present

## 2015-04-19 DIAGNOSIS — R269 Unspecified abnormalities of gait and mobility: Secondary | ICD-10-CM | POA: Diagnosis not present

## 2015-04-19 DIAGNOSIS — D62 Acute posthemorrhagic anemia: Secondary | ICD-10-CM | POA: Diagnosis not present

## 2015-04-19 DIAGNOSIS — J9691 Respiratory failure, unspecified with hypoxia: Secondary | ICD-10-CM | POA: Diagnosis not present

## 2015-04-19 DIAGNOSIS — E890 Postprocedural hypothyroidism: Secondary | ICD-10-CM | POA: Diagnosis not present

## 2015-04-19 DIAGNOSIS — Z9181 History of falling: Secondary | ICD-10-CM | POA: Diagnosis not present

## 2015-04-19 DIAGNOSIS — J188 Other pneumonia, unspecified organism: Secondary | ICD-10-CM | POA: Diagnosis not present

## 2015-04-19 DIAGNOSIS — I10 Essential (primary) hypertension: Secondary | ICD-10-CM | POA: Diagnosis not present

## 2015-04-19 DIAGNOSIS — F0390 Unspecified dementia without behavioral disturbance: Secondary | ICD-10-CM | POA: Diagnosis not present

## 2015-04-19 DIAGNOSIS — K219 Gastro-esophageal reflux disease without esophagitis: Secondary | ICD-10-CM | POA: Diagnosis not present

## 2015-04-19 DIAGNOSIS — Z4789 Encounter for other orthopedic aftercare: Secondary | ICD-10-CM | POA: Diagnosis not present

## 2015-04-19 DIAGNOSIS — K5900 Constipation, unspecified: Secondary | ICD-10-CM | POA: Diagnosis not present

## 2015-04-19 MED ORDER — CLOPIDOGREL BISULFATE 75 MG PO TABS: 75.0000 mg | ORAL_TABLET | Freq: Every day | ORAL | Status: DC

## 2015-04-19 NOTE — Telephone Encounter (Signed)
Sent.  Given h/o barrett's esophagus, would continue PPI.

## 2015-04-20 ENCOUNTER — Other Ambulatory Visit: Payer: Self-pay | Admitting: *Deleted

## 2015-04-20 NOTE — Patient Outreach (Signed)
Final transition of care call:  Spoke with pt's spouse,reports pt was sick on his stomach again last night, just don't feel right today.  Spouse reports pt f/u with orthopedic MD 1/13, now has a smaller case on left arm.  Spouse reports pt vomited in MD's office 1/13.  Spouse states she did give pt Zofran, helped some, gave him coke and crackers this morning.  As discussed with spouse, will call MD if no improvement.  Spouse reports HH PT still coming, suppose to get [facility] aide lined up.   Spouse reports pt's pain is better, has had no  prescription pain medication in a week.  Spouse reports pt has been staying in bed, cannot put weight on left leg, is to f/u with Dr. Alvan Dame 1/30.  Spouse reports pt's BP been good, highest reading 147/88 with HH PT, usually lower.   RN CM discussed with spouse today is final transition of care call, no further nursing case management needs.       Plan to inform Dr. Damita Dunnings of completion of transition of care program, no further community nurse case management needs.   Plan to inform [facility]  [facility] CSW of discharging from community nurse case management services.  Care plan completed.   Zara Chess.   [place] Care Management  [phone]

## 2015-04-21 ENCOUNTER — Other Ambulatory Visit: Payer: Self-pay | Admitting: *Deleted

## 2015-04-21 ENCOUNTER — Encounter: Payer: Self-pay | Admitting: *Deleted

## 2015-04-21 NOTE — Patient Outreach (Signed)
[facility] [place] [facility]) Care Management  04/21/2015  Chambers Tucker Chandler 13-Dec-1930 [id number]  Phone call to patient's spouse to discuss transportation options.  Per patient's spouse, they no longer have [employer] and have now switched to [employer]. [place].  Per patient's spouse, she has lined up her own transportation using [employer] [facility] to get patient to an appointment.  Per patient's spouse, they are also working with [facility] PT on transferring patient from the wheelchair to the car and hopefully once he is able to complete the transfer, she will be able to drive him to his appointments herself.  Patient and spouse verbalized no further social work needs at this time.  Plan:  Patient's case to be closed to social work at this time.           RNCM to be notified of case closure   Sheralyn Boatman [facility] Care Management [phone]

## 2015-04-22 ENCOUNTER — Other Ambulatory Visit: Payer: Self-pay

## 2015-04-22 DIAGNOSIS — Z9181 History of falling: Secondary | ICD-10-CM | POA: Diagnosis not present

## 2015-04-22 DIAGNOSIS — Z79891 Long term (current) use of opiate analgesic: Secondary | ICD-10-CM | POA: Diagnosis not present

## 2015-04-22 DIAGNOSIS — I69352 Hemiplegia and hemiparesis following cerebral infarction affecting left dominant side: Secondary | ICD-10-CM | POA: Diagnosis not present

## 2015-04-22 DIAGNOSIS — F0390 Unspecified dementia without behavioral disturbance: Secondary | ICD-10-CM | POA: Diagnosis not present

## 2015-04-22 DIAGNOSIS — W19XXXD Unspecified fall, subsequent encounter: Secondary | ICD-10-CM | POA: Diagnosis not present

## 2015-04-22 DIAGNOSIS — Z96653 Presence of artificial knee joint, bilateral: Secondary | ICD-10-CM | POA: Diagnosis not present

## 2015-04-22 DIAGNOSIS — S72142D Displaced intertrochanteric fracture of left femur, subsequent encounter for closed fracture with routine healing: Secondary | ICD-10-CM | POA: Diagnosis not present

## 2015-04-22 DIAGNOSIS — M9702XD Periprosthetic fracture around internal prosthetic left hip joint, subsequent encounter: Secondary | ICD-10-CM | POA: Diagnosis not present

## 2015-04-22 DIAGNOSIS — S52372D Galeazzi's fracture of left radius, subsequent encounter for closed fracture with routine healing: Secondary | ICD-10-CM | POA: Diagnosis not present

## 2015-04-22 DIAGNOSIS — I10 Essential (primary) hypertension: Secondary | ICD-10-CM | POA: Diagnosis not present

## 2015-04-22 DIAGNOSIS — Z7902 Long term (current) use of antithrombotics/antiplatelets: Secondary | ICD-10-CM | POA: Diagnosis not present

## 2015-04-22 NOTE — Telephone Encounter (Signed)
Pt's wife left note requesting refill for multiple meds;

## 2015-04-23 DIAGNOSIS — E890 Postprocedural hypothyroidism: Secondary | ICD-10-CM | POA: Diagnosis not present

## 2015-04-23 DIAGNOSIS — K5900 Constipation, unspecified: Secondary | ICD-10-CM | POA: Diagnosis not present

## 2015-04-23 DIAGNOSIS — D62 Acute posthemorrhagic anemia: Secondary | ICD-10-CM | POA: Diagnosis not present

## 2015-04-23 DIAGNOSIS — K219 Gastro-esophageal reflux disease without esophagitis: Secondary | ICD-10-CM | POA: Diagnosis not present

## 2015-04-23 DIAGNOSIS — S72145D Nondisplaced intertrochanteric fracture of left femur, subsequent encounter for closed fracture with routine healing: Secondary | ICD-10-CM | POA: Diagnosis not present

## 2015-04-23 DIAGNOSIS — J188 Other pneumonia, unspecified organism: Secondary | ICD-10-CM | POA: Diagnosis not present

## 2015-04-23 DIAGNOSIS — I10 Essential (primary) hypertension: Secondary | ICD-10-CM | POA: Diagnosis not present

## 2015-04-23 DIAGNOSIS — J9691 Respiratory failure, unspecified with hypoxia: Secondary | ICD-10-CM | POA: Diagnosis not present

## 2015-04-23 DIAGNOSIS — Z4789 Encounter for other orthopedic aftercare: Secondary | ICD-10-CM | POA: Diagnosis not present

## 2015-04-23 DIAGNOSIS — F0390 Unspecified dementia without behavioral disturbance: Secondary | ICD-10-CM | POA: Diagnosis not present

## 2015-04-23 DIAGNOSIS — Z9181 History of falling: Secondary | ICD-10-CM | POA: Diagnosis not present

## 2015-04-23 MED ORDER — AMLODIPINE BESYLATE 10 MG PO TABS: 10.0000 mg | ORAL_TABLET | Freq: Every day | ORAL | Status: DC

## 2015-04-23 MED ORDER — OMEPRAZOLE 40 MG PO CPDR: 40.0000 mg | DELAYED_RELEASE_CAPSULE | Freq: Every day | ORAL | Status: DC

## 2015-04-23 MED ORDER — LEVOTHYROXINE SODIUM 100 MCG PO TABS: ORAL_TABLET | ORAL | Status: DC

## 2015-04-23 MED ORDER — ESCITALOPRAM OXALATE 5 MG PO TABS: 5.0000 mg | ORAL_TABLET | Freq: Every day | ORAL | Status: DC

## 2015-04-23 MED ORDER — CLOPIDOGREL BISULFATE 75 MG PO TABS: 75.0000 mg | ORAL_TABLET | Freq: Every day | ORAL | Status: DC

## 2015-04-23 MED ORDER — TERAZOSIN HCL 5 MG PO CAPS: 5.0000 mg | ORAL_CAPSULE | Freq: Every day | ORAL | Status: DC

## 2015-04-23 MED ORDER — DONEPEZIL HCL 10 MG PO TABS: 10.0000 mg | ORAL_TABLET | Freq: Every day | ORAL | Status: DC

## 2015-04-23 NOTE — Telephone Encounter (Signed)
Pt's wife said acct has been set up with [facility] and request refill on amlodipine 10 mg (prescribed while pt was in hospital by Dr Coralyn Pear; pt is presently taking amlodipine),donepezil 10 mg (last refilled# 90 x 3 on 10/05/13; pt is presently taking meds; pt wife was not sure why still had pills if no refill since summer of 2016), levothyroxine 100 mcg last filled # 90 x 1 on 09/26/14; last TSH was 0.287 while in hospital 03/14/15; omeprazole 40 mg that Dr Cristina Gong GI has been prescribing for pt; also requested refills plavix,lexaprol and terazosin which were refilled by transferring [facility]ills from [facility] to [facility] per protocol. Pt had hospital f/u 03/31/15 but do not see recent annual exam. No future appt scheduled. Spoke with Santiago Glad at [facility] and cancelled refills on lexapro and plavix.

## 2015-04-23 NOTE — Telephone Encounter (Signed)
Sen.  Thanks.

## 2015-04-28 ENCOUNTER — Encounter: Payer: Self-pay | Admitting: Neurology

## 2015-04-28 ENCOUNTER — Ambulatory Visit (INDEPENDENT_AMBULATORY_CARE_PROVIDER_SITE_OTHER): Payer: PPO | Admitting: Neurology

## 2015-04-28 VITALS — BP 109/74 | HR 99 | Ht 68.0 in

## 2015-04-28 DIAGNOSIS — I951 Orthostatic hypotension: Secondary | ICD-10-CM

## 2015-04-28 DIAGNOSIS — F0390 Unspecified dementia without behavioral disturbance: Secondary | ICD-10-CM

## 2015-04-28 DIAGNOSIS — I63511 Cerebral infarction due to unspecified occlusion or stenosis of right middle cerebral artery: Secondary | ICD-10-CM

## 2015-04-28 NOTE — Patient Instructions (Signed)
-   continue plavix for stroke prevention - discontinue BP meds including amlodipine and terazosin due to low BP - check BP at home especially with PT/OT at home to check BP lying, sitting and standing - BP goal 110-140 - keep hydrated and avoid dehydration - Follow up with your primary care physician for stroke risk factor modification. Recommend maintain blood pressure goal around 120-130/80, diabetes with hemoglobin A1c goal below 6.5% and lipids with LDL cholesterol goal below 70 mg/dL.  - head of bed 30 degree - stocking socks above the knee bilaterally - follow up in 2 months.

## 2015-04-29 DIAGNOSIS — Z9181 History of falling: Secondary | ICD-10-CM | POA: Diagnosis not present

## 2015-04-29 DIAGNOSIS — F0390 Unspecified dementia without behavioral disturbance: Secondary | ICD-10-CM | POA: Insufficient documentation

## 2015-04-29 DIAGNOSIS — S52372D Galeazzi's fracture of left radius, subsequent encounter for closed fracture with routine healing: Secondary | ICD-10-CM | POA: Diagnosis not present

## 2015-04-29 DIAGNOSIS — Z79891 Long term (current) use of opiate analgesic: Secondary | ICD-10-CM | POA: Diagnosis not present

## 2015-04-29 DIAGNOSIS — I951 Orthostatic hypotension: Secondary | ICD-10-CM | POA: Insufficient documentation

## 2015-04-29 DIAGNOSIS — I10 Essential (primary) hypertension: Secondary | ICD-10-CM | POA: Diagnosis not present

## 2015-04-29 DIAGNOSIS — M9702XD Periprosthetic fracture around internal prosthetic left hip joint, subsequent encounter: Secondary | ICD-10-CM | POA: Diagnosis not present

## 2015-04-29 DIAGNOSIS — Z7902 Long term (current) use of antithrombotics/antiplatelets: Secondary | ICD-10-CM | POA: Diagnosis not present

## 2015-04-29 DIAGNOSIS — Z96653 Presence of artificial knee joint, bilateral: Secondary | ICD-10-CM | POA: Diagnosis not present

## 2015-04-29 DIAGNOSIS — W19XXXD Unspecified fall, subsequent encounter: Secondary | ICD-10-CM | POA: Diagnosis not present

## 2015-04-29 DIAGNOSIS — I69352 Hemiplegia and hemiparesis following cerebral infarction affecting left dominant side: Secondary | ICD-10-CM | POA: Diagnosis not present

## 2015-04-29 DIAGNOSIS — S72142D Displaced intertrochanteric fracture of left femur, subsequent encounter for closed fracture with routine healing: Secondary | ICD-10-CM | POA: Diagnosis not present

## 2015-04-29 NOTE — Progress Notes (Signed)
STROKE NEUROLOGY FOLLOW UP NOTE  NAME: Benjamim Mathison Virgin DOB: [birth year]  REASON FOR VISIT: stroke follow up HISTORY FROM: wife and chart  Today we had the pleasure of seeing Jacub Fifita Minahan in follow-up at our Neurology Clinic. Pt was accompanied by wife and son.   History Summary Mr. Tzuriel Boehnke Soroka is a 82 y.o. male with history of TIA, HTN, dementia, hypothyroidism, left hip fracture on 9/22 and subsequent surgery on 9/23 was admitted on 02/09/15 for left facial droop, left side weakness. MRI brain showed right MCA lenticulostriate infarct with distribution more likely at long insular artery. MRA, CUS, TTE, LDL and A1C were all unremarkable. His stroke was considered as small vessel disease and switched from ASA to plavix for stroke prevention. 30 day cardiac event monitoring was also recommended as the stroke location suspicious for M2 territory infarct without significant risk factors. Patient was then discharged to CIR.  Interval History During the interval time, the pt suffered again fall in December causing left femur and left wrist fractures.  At the ORIF at distal femur and distal radial fracture. Since then, he was medically bedridden due to left leg pain. He still has home health PT OT to work with him at home, but still has minimal activity. As per wife, last night, he was put in sitting position on the bedside commode for bowel movement, but had syncope episode. Today in clinic his BP was found to be low at sitting position as low as 71/53. Patient complained discomfort, was put on bed at lying position, repeat BP at 117/77, patient felt much better. Wife denies any dehydration, but stated that he didn't eat very well as before due to some nausea and vomiting.   REVIEW OF SYSTEMS: Full 14 system review of systems performed and notable only for those listed below and in HPI above, all others are negative:  Constitutional:   Cardiovascular:  Ear/Nose/Throat:   Skin:  Eyes:     Respiratory:   Gastroitestinal:   Genitourinary:  Hematology/Lymphatic:   Endocrine:  Musculoskeletal:   Allergy/Immunology:   Neurological:  Memory loss, confusion Psychiatric: Depression, too much sleep, disinterest in activities Sleep: Sleepiness, snoring  The following represents the patient's updated allergies and side effects list: Allergies  Allergen Reactions  . Sulfonamide Derivatives Other (See Comments)    Childhood allergy  . Valtrex [Valacyclovir Hcl]     NAV    The neurologically relevant items on the patient's problem list were reviewed on today's visit.  Neurologic Examination  A problem focused neurological exam (12 or more points of the single system neurologic examination, vital signs counts as 1 point, cranial nerves count for 8 points) was performed.  Blood pressure 109/74, pulse 99, height 5\' 8"  (1.727 m).   Sitting BP 79/46, 71/53, lying 109/74, 117/77  General - Thin built, well developed, obvious discomfort in sitting position, no LOC, after putting in lying position, pt back to his mental baseline.  Ophthalmologic - Fundi not visualized due to noncooperation.  Cardiovascular - Regular rate and rhythm with no murmur.  Neuro -  Awake, alert, able to tell his name, not orientated to age, time, place, and people. Paucity of speech, not cooperative on language testing, but grossly no dysarthria, follow simple one step commands. Eyes blinking to visual threat bilaterally, attending to both directions, PERRL, facial symmetrical, tongue in midline, moving all extremities spontaneously, however, left arm moving slow due to left wrist brace, and left leg limited ROM at left hip  and knee due to pain. Bilateral hand grape, and to dorsiflexion symmetrical. Sensation and coordination not cooperative on exam. DTR 1+ throughout, negative Babinski bilaterally. Gait not tested due to orthostatic hypotension.   Data reviewed: I personally reviewed the images and  agree with the radiology interpretations.  Ct Head Wo Contrast 02/09/2015 Advanced atrophy, chronic small vessel ischemic change, and ventricular enlargement.   Mri & Mra Brain Wo Contrast 02/09/2015 Acute RIGHT MCA lenticulostriate territory infarct affecting the superior most insula as well as the subcortical and periventricular white matter of the RIGHT hemisphere. This is nonhemorrhagic. Advanced atrophy and small vessel disease. Numerous chronic microbleeds representing sequelae of hypertensive cerebrovascular disease. No proximal vascular occlusion or stenosis of significance.   TTE - - Left ventricle: The cavity size was normal. There was moderate focal basal and mild concentric hypertrophy. Systolic function was vigorous. The estimated ejection fraction was in the range of 65% to 70%. Wall motion was normal; there were no regional wall motion abnormalities. Doppler parameters are consistent with abnormal left ventricular relaxation (grade 1 diastolic dysfunction). - Aortic valve: There was trivial regurgitation. - Aorta: Aortic root dimension: 38 mm (ED). - Ascending aorta: The ascending aorta was mildly dilated. Impressions: - Compared to the prior study, there has been no significant interval change.  CUS - Bilateral: 1-39% ICA stenosis. Vertebral artery flow is antegrade.   Component     Latest Ref Rng 02/09/2015 02/10/2015 [id number]  Cholesterol     0 - 200 mg/dL  108   Triglycerides     <150 mg/dL  50   HDL Cholesterol     >40 mg/dL  43   Total CHOL/HDL Ratio       2.5   VLDL     0 - 40 mg/dL  10   LDL (calc)     0 - 99 mg/dL  55   Hemoglobin A1C     4.8 - 5.6 % 5.3  5.8 (H)  Mean Plasma Glucose      105  120  TSH     0.350 - 4.500 uIU/mL  0.809 0.287 (L)    Assessment: As you [place] recall, he is a 82 y.o. Caucasian male with PMH of TIA, HTN, dementia, hypothyroidism, left hip fracture on 9/22 and subsequent surgery on 9/23 was admitted on  02/09/15 for left facial droop, left side weakness. MRI brain showed right MCA lenticulostriate infarct with distribution more likely at long insular artery. MRA, CUS, TTE, LDL and A1C were all unremarkable. His ASA was switched to plavix for stroke prevention. 30 day cardiac event monitoring was also recommended as the stroke location suspicious for M2 territory infarct without significant risk factors. Patient was then discharged to CIR and then discharged home.   During the interval time, the pt suffered again fall in December causing left femur and left wrist fractures.  Had ORIF at distal femur and distal radial fracture. Since then, he was medically bedridden due to left leg pain. He had syncope episode yesterday while sitting in bedside commode. Today in clinic he had symptomatic orthostatic hypotension resolved on lying position. Will recommend had a bad 30, salt supplement, and stocking socks about knee. Discontinue blood pressure medication, and check blood pressure home with home health PT OT.  He has no significant regional stroke risk factors. However there is a concerning for embolic pattern due to potential right long insular artery involvement. So far no 30 day cardiac event monitoring has been done. There is also  possibility for paradoxical emboli due to bedridden and left hip fracture before stroke. However, due to recent fall and further fractures at left femur and left wrist, in combination with orthostatic hypotension and advanced dementia, further workup such as 30 day cardiac monitoring, TCD bubble study and lower extremity venous Doppler will be on hold at this time.  Plan:  - continue plavix for stroke prevention - discontinue BP meds including amlodipine and terazosin due to symptomatic orthostatic hypotension - check BP at home, especially with PT/OT at home to check BP lying, sitting and standing. Make log for PCP review. - BP goal 110-140 - keep hydrated and avoid  dehydration - Follow up with your primary care physician for stroke risk factor modification. Recommend maintain blood pressure goal around 120-130/80, diabetes with hemoglobin A1c goal below 6.5% and lipids with LDL cholesterol goal below 70 mg/dL.  - head of bed 30 degree, increase salt supplement. - stocking socks above the knee bilaterally - follow up in 2 months.  A total of 45 minutes was spent face-to-face with this patient. Over half this time was spent on counseling patient on the orthostatic hypotension diagnosis and management. He'll also discuss about different diagnostic and therapeutic options available for stroke etiology, diagnosis and management.    No orders of the defined types were placed in this encounter.    No orders of the defined types were placed in this encounter.    Patient Instructions  - continue plavix for stroke prevention - discontinue BP meds including amlodipine and terazosin due to low BP - check BP at home especially with PT/OT at home to check BP lying, sitting and standing - BP goal 110-140 - keep hydrated and avoid dehydration - Follow up with your primary care physician for stroke risk factor modification. Recommend maintain blood pressure goal around 120-130/80, diabetes with hemoglobin A1c goal below 6.5% and lipids with LDL cholesterol goal below 70 mg/dL.  - head of bed 30 degree - stocking socks above the knee bilaterally - follow up in 2 months.    Rosalin Hawking, MD PhD [facility] [address] [place], [place] [address] [phone]

## 2015-05-04 ENCOUNTER — Telehealth: Payer: Self-pay | Admitting: *Deleted

## 2015-05-04 ENCOUNTER — Other Ambulatory Visit: Payer: Self-pay | Admitting: Family Medicine

## 2015-05-04 DIAGNOSIS — S72442D Displaced fracture of lower epiphysis (separation) of left femur, subsequent encounter for closed fracture with routine healing: Secondary | ICD-10-CM | POA: Diagnosis not present

## 2015-05-04 DIAGNOSIS — Z96642 Presence of left artificial hip joint: Secondary | ICD-10-CM | POA: Diagnosis not present

## 2015-05-04 MED ORDER — PANTOPRAZOLE SODIUM 40 MG PO TBEC: 40.0000 mg | DELAYED_RELEASE_TABLET | Freq: Every day | ORAL | Status: DC

## 2015-05-04 NOTE — Telephone Encounter (Signed)
I think I got this at the end of the week and today is the next business day, and I think I've already done it.   If so, then shred the incoming copy.  Thanks.

## 2015-05-04 NOTE — Telephone Encounter (Signed)
Faxed and sent Rx for Pantoprazole as instructed.

## 2015-05-04 NOTE — Telephone Encounter (Signed)
[name] with [facility] left a voicemail stating that they faxed over a form last week with the interaction between Plavix and Omeprazole. [name] is calling back wanting to know the status of the form. [name] requested a call back to Judson Roch at [phone]. [name] stated that they are faxing the form over again now in case it was not received last week.

## 2015-05-05 DIAGNOSIS — Z96653 Presence of artificial knee joint, bilateral: Secondary | ICD-10-CM | POA: Diagnosis not present

## 2015-05-05 DIAGNOSIS — I10 Essential (primary) hypertension: Secondary | ICD-10-CM | POA: Diagnosis not present

## 2015-05-05 DIAGNOSIS — Z79891 Long term (current) use of opiate analgesic: Secondary | ICD-10-CM | POA: Diagnosis not present

## 2015-05-05 DIAGNOSIS — Z9181 History of falling: Secondary | ICD-10-CM | POA: Diagnosis not present

## 2015-05-05 DIAGNOSIS — S52372D Galeazzi's fracture of left radius, subsequent encounter for closed fracture with routine healing: Secondary | ICD-10-CM | POA: Diagnosis not present

## 2015-05-05 DIAGNOSIS — I69352 Hemiplegia and hemiparesis following cerebral infarction affecting left dominant side: Secondary | ICD-10-CM | POA: Diagnosis not present

## 2015-05-05 DIAGNOSIS — S72142D Displaced intertrochanteric fracture of left femur, subsequent encounter for closed fracture with routine healing: Secondary | ICD-10-CM | POA: Diagnosis not present

## 2015-05-05 DIAGNOSIS — Z7902 Long term (current) use of antithrombotics/antiplatelets: Secondary | ICD-10-CM | POA: Diagnosis not present

## 2015-05-05 DIAGNOSIS — W19XXXD Unspecified fall, subsequent encounter: Secondary | ICD-10-CM | POA: Diagnosis not present

## 2015-05-05 DIAGNOSIS — F0390 Unspecified dementia without behavioral disturbance: Secondary | ICD-10-CM | POA: Diagnosis not present

## 2015-05-05 DIAGNOSIS — M9702XD Periprosthetic fracture around internal prosthetic left hip joint, subsequent encounter: Secondary | ICD-10-CM | POA: Diagnosis not present

## 2015-05-08 DIAGNOSIS — S52372D Galeazzi's fracture of left radius, subsequent encounter for closed fracture with routine healing: Secondary | ICD-10-CM | POA: Diagnosis not present

## 2015-05-11 DIAGNOSIS — S52372D Galeazzi's fracture of left radius, subsequent encounter for closed fracture with routine healing: Secondary | ICD-10-CM | POA: Diagnosis not present

## 2015-05-11 DIAGNOSIS — I69352 Hemiplegia and hemiparesis following cerebral infarction affecting left dominant side: Secondary | ICD-10-CM | POA: Diagnosis not present

## 2015-05-11 DIAGNOSIS — W19XXXD Unspecified fall, subsequent encounter: Secondary | ICD-10-CM | POA: Diagnosis not present

## 2015-05-11 DIAGNOSIS — Z96653 Presence of artificial knee joint, bilateral: Secondary | ICD-10-CM | POA: Diagnosis not present

## 2015-05-11 DIAGNOSIS — M9702XD Periprosthetic fracture around internal prosthetic left hip joint, subsequent encounter: Secondary | ICD-10-CM | POA: Diagnosis not present

## 2015-05-11 DIAGNOSIS — Z9181 History of falling: Secondary | ICD-10-CM | POA: Diagnosis not present

## 2015-05-11 DIAGNOSIS — I10 Essential (primary) hypertension: Secondary | ICD-10-CM | POA: Diagnosis not present

## 2015-05-11 DIAGNOSIS — Z79891 Long term (current) use of opiate analgesic: Secondary | ICD-10-CM | POA: Diagnosis not present

## 2015-05-11 DIAGNOSIS — S72142D Displaced intertrochanteric fracture of left femur, subsequent encounter for closed fracture with routine healing: Secondary | ICD-10-CM | POA: Diagnosis not present

## 2015-05-11 DIAGNOSIS — F0390 Unspecified dementia without behavioral disturbance: Secondary | ICD-10-CM | POA: Diagnosis not present

## 2015-05-11 DIAGNOSIS — Z7902 Long term (current) use of antithrombotics/antiplatelets: Secondary | ICD-10-CM | POA: Diagnosis not present

## 2015-05-19 DIAGNOSIS — W19XXXD Unspecified fall, subsequent encounter: Secondary | ICD-10-CM | POA: Diagnosis not present

## 2015-05-19 DIAGNOSIS — F0390 Unspecified dementia without behavioral disturbance: Secondary | ICD-10-CM | POA: Diagnosis not present

## 2015-05-19 DIAGNOSIS — I69352 Hemiplegia and hemiparesis following cerebral infarction affecting left dominant side: Secondary | ICD-10-CM | POA: Diagnosis not present

## 2015-05-19 DIAGNOSIS — M9702XD Periprosthetic fracture around internal prosthetic left hip joint, subsequent encounter: Secondary | ICD-10-CM | POA: Diagnosis not present

## 2015-05-19 DIAGNOSIS — S72142D Displaced intertrochanteric fracture of left femur, subsequent encounter for closed fracture with routine healing: Secondary | ICD-10-CM | POA: Diagnosis not present

## 2015-05-19 DIAGNOSIS — Z9181 History of falling: Secondary | ICD-10-CM | POA: Diagnosis not present

## 2015-05-19 DIAGNOSIS — S52372D Galeazzi's fracture of left radius, subsequent encounter for closed fracture with routine healing: Secondary | ICD-10-CM | POA: Diagnosis not present

## 2015-05-19 DIAGNOSIS — Z7902 Long term (current) use of antithrombotics/antiplatelets: Secondary | ICD-10-CM | POA: Diagnosis not present

## 2015-05-19 DIAGNOSIS — Z96653 Presence of artificial knee joint, bilateral: Secondary | ICD-10-CM | POA: Diagnosis not present

## 2015-05-19 DIAGNOSIS — Z79891 Long term (current) use of opiate analgesic: Secondary | ICD-10-CM | POA: Diagnosis not present

## 2015-05-19 DIAGNOSIS — I10 Essential (primary) hypertension: Secondary | ICD-10-CM | POA: Diagnosis not present

## 2015-05-20 DIAGNOSIS — I10 Essential (primary) hypertension: Secondary | ICD-10-CM | POA: Diagnosis not present

## 2015-05-20 DIAGNOSIS — E890 Postprocedural hypothyroidism: Secondary | ICD-10-CM | POA: Diagnosis not present

## 2015-05-20 DIAGNOSIS — F0390 Unspecified dementia without behavioral disturbance: Secondary | ICD-10-CM | POA: Diagnosis not present

## 2015-05-20 DIAGNOSIS — S72145D Nondisplaced intertrochanteric fracture of left femur, subsequent encounter for closed fracture with routine healing: Secondary | ICD-10-CM | POA: Diagnosis not present

## 2015-05-20 DIAGNOSIS — D62 Acute posthemorrhagic anemia: Secondary | ICD-10-CM | POA: Diagnosis not present

## 2015-05-20 DIAGNOSIS — Z9181 History of falling: Secondary | ICD-10-CM | POA: Diagnosis not present

## 2015-05-20 DIAGNOSIS — S72142D Displaced intertrochanteric fracture of left femur, subsequent encounter for closed fracture with routine healing: Secondary | ICD-10-CM | POA: Diagnosis not present

## 2015-05-20 DIAGNOSIS — K5900 Constipation, unspecified: Secondary | ICD-10-CM | POA: Diagnosis not present

## 2015-05-20 DIAGNOSIS — S72442D Displaced fracture of lower epiphysis (separation) of left femur, subsequent encounter for closed fracture with routine healing: Secondary | ICD-10-CM | POA: Diagnosis not present

## 2015-05-20 DIAGNOSIS — J188 Other pneumonia, unspecified organism: Secondary | ICD-10-CM | POA: Diagnosis not present

## 2015-05-20 DIAGNOSIS — J9691 Respiratory failure, unspecified with hypoxia: Secondary | ICD-10-CM | POA: Diagnosis not present

## 2015-05-20 DIAGNOSIS — K219 Gastro-esophageal reflux disease without esophagitis: Secondary | ICD-10-CM | POA: Diagnosis not present

## 2015-05-20 DIAGNOSIS — R269 Unspecified abnormalities of gait and mobility: Secondary | ICD-10-CM | POA: Diagnosis not present

## 2015-05-20 DIAGNOSIS — Z4789 Encounter for other orthopedic aftercare: Secondary | ICD-10-CM | POA: Diagnosis not present

## 2015-05-21 ENCOUNTER — Other Ambulatory Visit: Payer: Self-pay

## 2015-05-24 DIAGNOSIS — S72145D Nondisplaced intertrochanteric fracture of left femur, subsequent encounter for closed fracture with routine healing: Secondary | ICD-10-CM | POA: Diagnosis not present

## 2015-05-24 DIAGNOSIS — F0390 Unspecified dementia without behavioral disturbance: Secondary | ICD-10-CM | POA: Diagnosis not present

## 2015-05-24 DIAGNOSIS — I10 Essential (primary) hypertension: Secondary | ICD-10-CM | POA: Diagnosis not present

## 2015-05-24 DIAGNOSIS — J188 Other pneumonia, unspecified organism: Secondary | ICD-10-CM | POA: Diagnosis not present

## 2015-05-24 DIAGNOSIS — K219 Gastro-esophageal reflux disease without esophagitis: Secondary | ICD-10-CM | POA: Diagnosis not present

## 2015-05-24 DIAGNOSIS — E890 Postprocedural hypothyroidism: Secondary | ICD-10-CM | POA: Diagnosis not present

## 2015-05-24 DIAGNOSIS — Z9181 History of falling: Secondary | ICD-10-CM | POA: Diagnosis not present

## 2015-05-24 DIAGNOSIS — Z4789 Encounter for other orthopedic aftercare: Secondary | ICD-10-CM | POA: Diagnosis not present

## 2015-05-24 DIAGNOSIS — D62 Acute posthemorrhagic anemia: Secondary | ICD-10-CM | POA: Diagnosis not present

## 2015-05-24 DIAGNOSIS — K5900 Constipation, unspecified: Secondary | ICD-10-CM | POA: Diagnosis not present

## 2015-05-24 DIAGNOSIS — J9691 Respiratory failure, unspecified with hypoxia: Secondary | ICD-10-CM | POA: Diagnosis not present

## 2015-05-27 DIAGNOSIS — Z7902 Long term (current) use of antithrombotics/antiplatelets: Secondary | ICD-10-CM | POA: Diagnosis not present

## 2015-05-27 DIAGNOSIS — M9702XD Periprosthetic fracture around internal prosthetic left hip joint, subsequent encounter: Secondary | ICD-10-CM | POA: Diagnosis not present

## 2015-05-27 DIAGNOSIS — I10 Essential (primary) hypertension: Secondary | ICD-10-CM | POA: Diagnosis not present

## 2015-05-27 DIAGNOSIS — S72142D Displaced intertrochanteric fracture of left femur, subsequent encounter for closed fracture with routine healing: Secondary | ICD-10-CM | POA: Diagnosis not present

## 2015-05-27 DIAGNOSIS — S52372D Galeazzi's fracture of left radius, subsequent encounter for closed fracture with routine healing: Secondary | ICD-10-CM | POA: Diagnosis not present

## 2015-05-27 DIAGNOSIS — F0390 Unspecified dementia without behavioral disturbance: Secondary | ICD-10-CM | POA: Diagnosis not present

## 2015-05-27 DIAGNOSIS — Z79891 Long term (current) use of opiate analgesic: Secondary | ICD-10-CM | POA: Diagnosis not present

## 2015-05-27 DIAGNOSIS — W19XXXD Unspecified fall, subsequent encounter: Secondary | ICD-10-CM | POA: Diagnosis not present

## 2015-05-27 DIAGNOSIS — I69352 Hemiplegia and hemiparesis following cerebral infarction affecting left dominant side: Secondary | ICD-10-CM | POA: Diagnosis not present

## 2015-05-27 DIAGNOSIS — Z96653 Presence of artificial knee joint, bilateral: Secondary | ICD-10-CM | POA: Diagnosis not present

## 2015-05-27 DIAGNOSIS — Z9181 History of falling: Secondary | ICD-10-CM | POA: Diagnosis not present

## 2015-06-01 DIAGNOSIS — Z9181 History of falling: Secondary | ICD-10-CM | POA: Diagnosis not present

## 2015-06-01 DIAGNOSIS — F0390 Unspecified dementia without behavioral disturbance: Secondary | ICD-10-CM | POA: Diagnosis not present

## 2015-06-01 DIAGNOSIS — Z79891 Long term (current) use of opiate analgesic: Secondary | ICD-10-CM | POA: Diagnosis not present

## 2015-06-01 DIAGNOSIS — Z7902 Long term (current) use of antithrombotics/antiplatelets: Secondary | ICD-10-CM | POA: Diagnosis not present

## 2015-06-01 DIAGNOSIS — S52372D Galeazzi's fracture of left radius, subsequent encounter for closed fracture with routine healing: Secondary | ICD-10-CM | POA: Diagnosis not present

## 2015-06-01 DIAGNOSIS — M9702XD Periprosthetic fracture around internal prosthetic left hip joint, subsequent encounter: Secondary | ICD-10-CM | POA: Diagnosis not present

## 2015-06-01 DIAGNOSIS — S72142D Displaced intertrochanteric fracture of left femur, subsequent encounter for closed fracture with routine healing: Secondary | ICD-10-CM | POA: Diagnosis not present

## 2015-06-01 DIAGNOSIS — I10 Essential (primary) hypertension: Secondary | ICD-10-CM | POA: Diagnosis not present

## 2015-06-01 DIAGNOSIS — Z96653 Presence of artificial knee joint, bilateral: Secondary | ICD-10-CM | POA: Diagnosis not present

## 2015-06-01 DIAGNOSIS — I69352 Hemiplegia and hemiparesis following cerebral infarction affecting left dominant side: Secondary | ICD-10-CM | POA: Diagnosis not present

## 2015-06-01 DIAGNOSIS — W19XXXD Unspecified fall, subsequent encounter: Secondary | ICD-10-CM | POA: Diagnosis not present

## 2015-06-05 DIAGNOSIS — S52372D Galeazzi's fracture of left radius, subsequent encounter for closed fracture with routine healing: Secondary | ICD-10-CM | POA: Diagnosis not present

## 2015-06-09 DIAGNOSIS — I10 Essential (primary) hypertension: Secondary | ICD-10-CM | POA: Diagnosis not present

## 2015-06-09 DIAGNOSIS — F0390 Unspecified dementia without behavioral disturbance: Secondary | ICD-10-CM | POA: Diagnosis not present

## 2015-06-09 DIAGNOSIS — W19XXXD Unspecified fall, subsequent encounter: Secondary | ICD-10-CM | POA: Diagnosis not present

## 2015-06-09 DIAGNOSIS — Z96653 Presence of artificial knee joint, bilateral: Secondary | ICD-10-CM | POA: Diagnosis not present

## 2015-06-09 DIAGNOSIS — S52372D Galeazzi's fracture of left radius, subsequent encounter for closed fracture with routine healing: Secondary | ICD-10-CM | POA: Diagnosis not present

## 2015-06-09 DIAGNOSIS — S72142D Displaced intertrochanteric fracture of left femur, subsequent encounter for closed fracture with routine healing: Secondary | ICD-10-CM | POA: Diagnosis not present

## 2015-06-09 DIAGNOSIS — M9702XD Periprosthetic fracture around internal prosthetic left hip joint, subsequent encounter: Secondary | ICD-10-CM | POA: Diagnosis not present

## 2015-06-09 DIAGNOSIS — Z7902 Long term (current) use of antithrombotics/antiplatelets: Secondary | ICD-10-CM | POA: Diagnosis not present

## 2015-06-09 DIAGNOSIS — Z9181 History of falling: Secondary | ICD-10-CM | POA: Diagnosis not present

## 2015-06-09 DIAGNOSIS — I69352 Hemiplegia and hemiparesis following cerebral infarction affecting left dominant side: Secondary | ICD-10-CM | POA: Diagnosis not present

## 2015-06-09 DIAGNOSIS — Z79891 Long term (current) use of opiate analgesic: Secondary | ICD-10-CM | POA: Diagnosis not present

## 2015-06-15 DIAGNOSIS — S52372D Galeazzi's fracture of left radius, subsequent encounter for closed fracture with routine healing: Secondary | ICD-10-CM | POA: Diagnosis not present

## 2015-06-15 DIAGNOSIS — Z96653 Presence of artificial knee joint, bilateral: Secondary | ICD-10-CM | POA: Diagnosis not present

## 2015-06-15 DIAGNOSIS — S72142D Displaced intertrochanteric fracture of left femur, subsequent encounter for closed fracture with routine healing: Secondary | ICD-10-CM | POA: Diagnosis not present

## 2015-06-15 DIAGNOSIS — I69352 Hemiplegia and hemiparesis following cerebral infarction affecting left dominant side: Secondary | ICD-10-CM | POA: Diagnosis not present

## 2015-06-15 DIAGNOSIS — W19XXXD Unspecified fall, subsequent encounter: Secondary | ICD-10-CM | POA: Diagnosis not present

## 2015-06-15 DIAGNOSIS — F0390 Unspecified dementia without behavioral disturbance: Secondary | ICD-10-CM | POA: Diagnosis not present

## 2015-06-15 DIAGNOSIS — I10 Essential (primary) hypertension: Secondary | ICD-10-CM | POA: Diagnosis not present

## 2015-06-15 DIAGNOSIS — Z9181 History of falling: Secondary | ICD-10-CM | POA: Diagnosis not present

## 2015-06-15 DIAGNOSIS — M9702XD Periprosthetic fracture around internal prosthetic left hip joint, subsequent encounter: Secondary | ICD-10-CM | POA: Diagnosis not present

## 2015-06-15 DIAGNOSIS — Z79891 Long term (current) use of opiate analgesic: Secondary | ICD-10-CM | POA: Diagnosis not present

## 2015-06-15 DIAGNOSIS — Z7902 Long term (current) use of antithrombotics/antiplatelets: Secondary | ICD-10-CM | POA: Diagnosis not present

## 2015-06-17 DIAGNOSIS — F0390 Unspecified dementia without behavioral disturbance: Secondary | ICD-10-CM | POA: Diagnosis not present

## 2015-06-17 DIAGNOSIS — J188 Other pneumonia, unspecified organism: Secondary | ICD-10-CM | POA: Diagnosis not present

## 2015-06-17 DIAGNOSIS — S72145D Nondisplaced intertrochanteric fracture of left femur, subsequent encounter for closed fracture with routine healing: Secondary | ICD-10-CM | POA: Diagnosis not present

## 2015-06-17 DIAGNOSIS — K219 Gastro-esophageal reflux disease without esophagitis: Secondary | ICD-10-CM | POA: Diagnosis not present

## 2015-06-17 DIAGNOSIS — I10 Essential (primary) hypertension: Secondary | ICD-10-CM | POA: Diagnosis not present

## 2015-06-17 DIAGNOSIS — R269 Unspecified abnormalities of gait and mobility: Secondary | ICD-10-CM | POA: Diagnosis not present

## 2015-06-17 DIAGNOSIS — Z9181 History of falling: Secondary | ICD-10-CM | POA: Diagnosis not present

## 2015-06-17 DIAGNOSIS — K5900 Constipation, unspecified: Secondary | ICD-10-CM | POA: Diagnosis not present

## 2015-06-17 DIAGNOSIS — J9691 Respiratory failure, unspecified with hypoxia: Secondary | ICD-10-CM | POA: Diagnosis not present

## 2015-06-17 DIAGNOSIS — Z4789 Encounter for other orthopedic aftercare: Secondary | ICD-10-CM | POA: Diagnosis not present

## 2015-06-17 DIAGNOSIS — E890 Postprocedural hypothyroidism: Secondary | ICD-10-CM | POA: Diagnosis not present

## 2015-06-17 DIAGNOSIS — D62 Acute posthemorrhagic anemia: Secondary | ICD-10-CM | POA: Diagnosis not present

## 2015-06-21 DIAGNOSIS — F0390 Unspecified dementia without behavioral disturbance: Secondary | ICD-10-CM | POA: Diagnosis not present

## 2015-06-21 DIAGNOSIS — Z9181 History of falling: Secondary | ICD-10-CM | POA: Diagnosis not present

## 2015-06-21 DIAGNOSIS — I10 Essential (primary) hypertension: Secondary | ICD-10-CM | POA: Diagnosis not present

## 2015-06-21 DIAGNOSIS — S72145D Nondisplaced intertrochanteric fracture of left femur, subsequent encounter for closed fracture with routine healing: Secondary | ICD-10-CM | POA: Diagnosis not present

## 2015-06-21 DIAGNOSIS — K5900 Constipation, unspecified: Secondary | ICD-10-CM | POA: Diagnosis not present

## 2015-06-21 DIAGNOSIS — E890 Postprocedural hypothyroidism: Secondary | ICD-10-CM | POA: Diagnosis not present

## 2015-06-21 DIAGNOSIS — D62 Acute posthemorrhagic anemia: Secondary | ICD-10-CM | POA: Diagnosis not present

## 2015-06-21 DIAGNOSIS — J9691 Respiratory failure, unspecified with hypoxia: Secondary | ICD-10-CM | POA: Diagnosis not present

## 2015-06-21 DIAGNOSIS — Z4789 Encounter for other orthopedic aftercare: Secondary | ICD-10-CM | POA: Diagnosis not present

## 2015-06-21 DIAGNOSIS — K219 Gastro-esophageal reflux disease without esophagitis: Secondary | ICD-10-CM | POA: Diagnosis not present

## 2015-06-21 DIAGNOSIS — J188 Other pneumonia, unspecified organism: Secondary | ICD-10-CM | POA: Diagnosis not present

## 2015-06-23 DIAGNOSIS — I10 Essential (primary) hypertension: Secondary | ICD-10-CM | POA: Diagnosis not present

## 2015-06-23 DIAGNOSIS — M9702XD Periprosthetic fracture around internal prosthetic left hip joint, subsequent encounter: Secondary | ICD-10-CM | POA: Diagnosis not present

## 2015-06-23 DIAGNOSIS — Z7902 Long term (current) use of antithrombotics/antiplatelets: Secondary | ICD-10-CM | POA: Diagnosis not present

## 2015-06-23 DIAGNOSIS — F0390 Unspecified dementia without behavioral disturbance: Secondary | ICD-10-CM | POA: Diagnosis not present

## 2015-06-23 DIAGNOSIS — Z9181 History of falling: Secondary | ICD-10-CM | POA: Diagnosis not present

## 2015-06-23 DIAGNOSIS — S72142D Displaced intertrochanteric fracture of left femur, subsequent encounter for closed fracture with routine healing: Secondary | ICD-10-CM | POA: Diagnosis not present

## 2015-06-23 DIAGNOSIS — I69352 Hemiplegia and hemiparesis following cerebral infarction affecting left dominant side: Secondary | ICD-10-CM | POA: Diagnosis not present

## 2015-06-23 DIAGNOSIS — W19XXXD Unspecified fall, subsequent encounter: Secondary | ICD-10-CM | POA: Diagnosis not present

## 2015-06-23 DIAGNOSIS — Z96653 Presence of artificial knee joint, bilateral: Secondary | ICD-10-CM | POA: Diagnosis not present

## 2015-06-23 DIAGNOSIS — Z79891 Long term (current) use of opiate analgesic: Secondary | ICD-10-CM | POA: Diagnosis not present

## 2015-06-23 DIAGNOSIS — S52372D Galeazzi's fracture of left radius, subsequent encounter for closed fracture with routine healing: Secondary | ICD-10-CM | POA: Diagnosis not present

## 2015-06-26 ENCOUNTER — Encounter: Payer: Self-pay | Admitting: Neurology

## 2015-06-26 ENCOUNTER — Ambulatory Visit (INDEPENDENT_AMBULATORY_CARE_PROVIDER_SITE_OTHER): Payer: PPO | Admitting: Neurology

## 2015-06-26 VITALS — BP 102/72 | HR 84 | Ht 68.0 in

## 2015-06-26 DIAGNOSIS — I63511 Cerebral infarction due to unspecified occlusion or stenosis of right middle cerebral artery: Secondary | ICD-10-CM

## 2015-06-26 DIAGNOSIS — I951 Orthostatic hypotension: Secondary | ICD-10-CM

## 2015-06-26 DIAGNOSIS — F0390 Unspecified dementia without behavioral disturbance: Secondary | ICD-10-CM | POA: Diagnosis not present

## 2015-06-26 NOTE — Patient Instructions (Signed)
-   continue plavix for stroke prevention - recommend therapist to check BP at lying, sitting and standing and document for evaluation of orthostatic hypotension - check BP at home and BP goal 110-140 - keep hydrated and avoid dehydration - Follow up with your primary care physician for stroke risk factor modification. Recommend maintain blood pressure goal around 120-130/80, diabetes with hemoglobin A1c goal below 6.5% and lipids with LDL cholesterol goal below 70 mg/dL.  - head of bed 30 degree, increase salt supplement. - stocking socks above the knee bilaterally - hold off further aggressive work up at this time - [place] consider nursing facility if not able to handle at home. - follow up in 4 months.

## 2015-06-26 NOTE — Progress Notes (Signed)
STROKE NEUROLOGY FOLLOW UP NOTE  NAME: [name] DOB: Dec 03, 1930  REASON FOR VISIT: stroke follow up HISTORY FROM: wife and chart  Today we had the pleasure of seeing [name] in follow-up at our Neurology Clinic. Pt was accompanied by wife and son.   History Summary Mr. [name] is a 82 y.o. male with history of TIA, HTN, dementia, hypothyroidism, left hip fracture on 9/22 and subsequent surgery on 9/23 was admitted on 02/09/15 for left facial droop, left side weakness. MRI brain showed right MCA lenticulostriate infarct with distribution more likely at long insular artery. MRA, CUS, TTE, LDL and A1C were all unremarkable. His stroke was considered as small vessel disease and switched from ASA to plavix for stroke prevention. 30 day cardiac event monitoring was also recommended as the stroke location suspicious for M2 territory infarct without significant risk factors. Patient was then discharged to CIR.  04/28/15 follow up - the pt suffered again fall in December causing left femur and left wrist fractures.  At the ORIF at distal femur and distal radial fracture. Since then, he was medically bedridden due to left leg pain. He still has home health PT OT to work with him at home, but still has minimal activity. As per wife, last night, he was put in sitting position on the bedside commode for bowel movement, but had syncope episode. Today in clinic his BP was found to be low at sitting position as low as 71/53. Patient complained discomfort, was put on bed at lying position, repeat BP at 117/77, patient felt much better. Wife denies any dehydration, but stated that he didn't eat very well as before due to some nausea and vomiting.   Interval History During the interval time, pt has been doing better under wife's care. His BP meds were discontinued last visit, at home, wife found pt BP still high. So she put him back on the medications. Pt now most time bedridden, but able to sit or  stand with PT/OT at home. They have not checked orthostatic vital yet. BP lying down at home ranges 130-160, today in clinic sitting up was 102/72. Pt was put in exam bed this visit. He has head of bed elevation at home but not started to use stocking socks yet.   REVIEW OF SYSTEMS: Full 14 system review of systems performed and notable only for those listed below and in HPI above, all others are negative:  Constitutional:   Cardiovascular:  Ear/Nose/Throat:   Skin:  Eyes:   Respiratory:   Gastroitestinal:   Genitourinary: frequent urination Hematology/Lymphatic:   Endocrine:  Musculoskeletal:   Allergy/Immunology:   Neurological:   Psychiatric:  Sleep: Sleepiness, snoring, frequent waking  The following represents the patient's updated allergies and side effects list: Allergies  Allergen Reactions  . Sulfonamide Derivatives Other (See Comments)    Childhood allergy  . Valtrex [Valacyclovir Hcl]     NAV    The neurologically relevant items on the patient's problem list were reviewed on today's visit.  Neurologic Examination  A problem focused neurological exam (12 or more points of the single system neurologic examination, vital signs counts as 1 point, cranial nerves count for 8 points) was performed.  Blood pressure 102/72, pulse 84, height 5\' 8"  (1.727 m).   General - Thin built, well developed, lying in exam bed, no acute distress.  Ophthalmologic - Fundi not visualized due to noncooperation.  Cardiovascular - Regular rate and rhythm with no murmur.  Neuro -  Awake, alert, able to tell his name and people, not orientated to age, time, place. Paucity of speech, but able to follow simple commands, naming 2/2 and repeating intact.  Fund of Knowledge was assessed and was impaired.  Cranial Nerves II - XII - II - blinking to visual threat bilaterally. III, IV, VI - Extraocular movements intact. V - Facial sensation intact bilaterally. VII - Facial movement intact  bilaterally. VIII - Hearing & vestibular intact bilaterally. X - Palate elevates symmetrically. XI - Chin turning & shoulder shrug intact bilaterally. XII - Tongue protrusion intact.  Motor Strength - The patient's strength was 4+/5 in all extremities except LLE 3/5 due to previous surgery and pronator drift was absent.  Bulk was normal and fasciculations were absent.   Motor Tone - Muscle tone was assessed at the neck and appendages and was normal.  Reflexes - The patient's reflexes were 1+ in all extremities and he had no pathological reflexes.  Sensory - Light touch, temperature/pinprick were assessed and were symmetrical.    Coordination - not cooperative.  Tremor was absent.  Gait and Station - not able to test.   Data reviewed: I personally reviewed the images and agree with the radiology interpretations.  Ct Head Wo Contrast 02/09/2015 Advanced atrophy, chronic small vessel ischemic change, and ventricular enlargement.   Mri & Mra Brain Wo Contrast 02/09/2015 Acute RIGHT MCA lenticulostriate territory infarct affecting the superior most insula as well as the subcortical and periventricular white matter of the RIGHT hemisphere. This is nonhemorrhagic. Advanced atrophy and small vessel disease. Numerous chronic microbleeds representing sequelae of hypertensive cerebrovascular disease. No proximal vascular occlusion or stenosis of significance.   TTE - - Left ventricle: The cavity size was normal. There was moderate focal basal and mild concentric hypertrophy. Systolic function was vigorous. The estimated ejection fraction was in the range of 65% to 70%. Wall motion was normal; there were no regional wall motion abnormalities. Doppler parameters are consistent with abnormal left ventricular relaxation (grade 1 diastolic dysfunction). - Aortic valve: There was trivial regurgitation. - Aorta: Aortic root dimension: 38 mm (ED). - Ascending aorta: The ascending aorta  was mildly dilated. Impressions: - Compared to the prior study, there has been no significant interval change.  CUS - Bilateral: 1-39% ICA stenosis. Vertebral artery flow is antegrade.   Component     Latest Ref Rng 02/09/2015 02/10/2015 1June 22, 202016  Cholesterol     0 - 200 mg/dL  108   Triglycerides     <150 mg/dL  50   HDL Cholesterol     >40 mg/dL  43   Total CHOL/HDL Ratio       2.5   VLDL     0 - 40 mg/dL  10   LDL (calc)     0 - 99 mg/dL  55   Hemoglobin A1C     4.8 - 5.6 % 5.3  5.8 (H)  Mean Plasma Glucose      105  120  TSH     0.350 - 4.500 uIU/mL  0.809 0.287 (L)    Assessment: As you [place] recall, he is a 82 y.o. Caucasian male with PMH of TIA, HTN, dementia, hypothyroidism, left hip fracture on 9/22 and subsequent surgery on 9/23 was admitted on 02/09/15 for left facial droop, left side weakness. MRI brain showed right MCA lenticulostriate infarct with distribution more likely at long insular artery. MRA, CUS, TTE, LDL and A1C were all unremarkable. His ASA was switched to plavix for  stroke prevention. 30 day cardiac event monitoring was also recommended as the stroke location suspicious for M2 territory infarct without significant risk factors. Patient was then discharged to CIR and then discharged home.   The pt suffered again fall in 03/2015 causing left femur and left wrist fractures. Had ORIF at distal femur and distal radial fracture. Since then, he was mostly bedridden due to left leg pain and weakness. He had syncope episode in 04/2015 while sitting in bedside commode. He was found to have symptomatic orthostatic hypotension resolved on lying position. Recommend had a bad 30, salt supplement, and stocking socks about knee. Discontinue blood pressure medication. However, wife found pt supine hypertension and BP meds were restarted. Will need to check orthostatic blood pressure at home with home health PT OT.  He has no significant regional stroke risk factors.  However there is a concerning for embolic pattern due to potential right long insular artery involvement. So far no 30 day cardiac event monitoring has been done. There is also possibility for paradoxical emboli due to bedridden and left hip fracture before stroke. However, due to recent fall and further fractures at left femur and left wrist, in combination with orthostatic hypotension and advanced dementia, further workup such as 30 day cardiac monitoring, TCD bubble study and lower extremity venous Doppler will be further on hold at this time.  Plan:  - continue plavix for stroke prevention - recommend therapist to check BP at lying, sitting and standing and document for evaluation of orthostatic hypotension - check BP at home and BP goal 110-140 - keep hydrated and avoid dehydration - Follow up with your primary care physician for stroke risk factor modification. Recommend maintain blood pressure goal around 120-130/80, diabetes with hemoglobin A1c goal below 6.5% and lipids with LDL cholesterol goal below 70 mg/dL.  - head of bed 30 degree, increase salt supplement. - stocking socks above the knee bilaterally - hold off further aggressive work up at this time - [place] consider nursing facility if not able to handle at home. - follow up in 4 months.  I spent more than 25 minutes of face to face time with the patient. Greater than 50% of time was spent in counseling and coordination of care.   No orders of the defined types were placed in this encounter.    Meds ordered this encounter  Medications  . amLODipine (NORVASC) 10 MG tablet    Sig:   . terazosin (HYTRIN) 5 MG capsule    Sig:   . naproxen sodium (ANAPROX) 275 MG tablet    Sig: Take 275 mg by mouth 2 (two) times daily with a meal.    Patient Instructions  - continue plavix for stroke prevention - recommend therapist to check BP at lying, sitting and standing and document for evaluation of orthostatic hypotension - check BP  at home and BP goal 110-140 - keep hydrated and avoid dehydration - Follow up with your primary care physician for stroke risk factor modification. Recommend maintain blood pressure goal around 120-130/80, diabetes with hemoglobin A1c goal below 6.5% and lipids with LDL cholesterol goal below 70 mg/dL.  - head of bed 30 degree, increase salt supplement. - stocking socks above the knee bilaterally - hold off further aggressive work up at this time - [place] consider nursing facility if not able to handle at home. - follow up in 4 months.    Rosalin Hawking, MD PhD [facility] [address], [place], [place] [address] [phone]   

## 2015-06-30 DIAGNOSIS — Z7902 Long term (current) use of antithrombotics/antiplatelets: Secondary | ICD-10-CM | POA: Diagnosis not present

## 2015-06-30 DIAGNOSIS — I69352 Hemiplegia and hemiparesis following cerebral infarction affecting left dominant side: Secondary | ICD-10-CM | POA: Diagnosis not present

## 2015-06-30 DIAGNOSIS — I10 Essential (primary) hypertension: Secondary | ICD-10-CM | POA: Diagnosis not present

## 2015-06-30 DIAGNOSIS — M9702XD Periprosthetic fracture around internal prosthetic left hip joint, subsequent encounter: Secondary | ICD-10-CM | POA: Diagnosis not present

## 2015-06-30 DIAGNOSIS — Z9181 History of falling: Secondary | ICD-10-CM | POA: Diagnosis not present

## 2015-06-30 DIAGNOSIS — S52372D Galeazzi's fracture of left radius, subsequent encounter for closed fracture with routine healing: Secondary | ICD-10-CM | POA: Diagnosis not present

## 2015-06-30 DIAGNOSIS — Z96653 Presence of artificial knee joint, bilateral: Secondary | ICD-10-CM | POA: Diagnosis not present

## 2015-06-30 DIAGNOSIS — Z79891 Long term (current) use of opiate analgesic: Secondary | ICD-10-CM | POA: Diagnosis not present

## 2015-06-30 DIAGNOSIS — F0390 Unspecified dementia without behavioral disturbance: Secondary | ICD-10-CM | POA: Diagnosis not present

## 2015-06-30 DIAGNOSIS — S72142D Displaced intertrochanteric fracture of left femur, subsequent encounter for closed fracture with routine healing: Secondary | ICD-10-CM | POA: Diagnosis not present

## 2015-06-30 DIAGNOSIS — W19XXXD Unspecified fall, subsequent encounter: Secondary | ICD-10-CM | POA: Diagnosis not present

## 2015-07-02 DIAGNOSIS — S72442D Displaced fracture of lower epiphysis (separation) of left femur, subsequent encounter for closed fracture with routine healing: Secondary | ICD-10-CM | POA: Diagnosis not present

## 2015-07-06 DIAGNOSIS — Z96653 Presence of artificial knee joint, bilateral: Secondary | ICD-10-CM | POA: Diagnosis not present

## 2015-07-06 DIAGNOSIS — M9702XD Periprosthetic fracture around internal prosthetic left hip joint, subsequent encounter: Secondary | ICD-10-CM | POA: Diagnosis not present

## 2015-07-06 DIAGNOSIS — S72142D Displaced intertrochanteric fracture of left femur, subsequent encounter for closed fracture with routine healing: Secondary | ICD-10-CM | POA: Diagnosis not present

## 2015-07-06 DIAGNOSIS — Z9181 History of falling: Secondary | ICD-10-CM | POA: Diagnosis not present

## 2015-07-06 DIAGNOSIS — I10 Essential (primary) hypertension: Secondary | ICD-10-CM | POA: Diagnosis not present

## 2015-07-06 DIAGNOSIS — W19XXXD Unspecified fall, subsequent encounter: Secondary | ICD-10-CM | POA: Diagnosis not present

## 2015-07-06 DIAGNOSIS — I69352 Hemiplegia and hemiparesis following cerebral infarction affecting left dominant side: Secondary | ICD-10-CM | POA: Diagnosis not present

## 2015-07-06 DIAGNOSIS — S52372D Galeazzi's fracture of left radius, subsequent encounter for closed fracture with routine healing: Secondary | ICD-10-CM | POA: Diagnosis not present

## 2015-07-06 DIAGNOSIS — F0390 Unspecified dementia without behavioral disturbance: Secondary | ICD-10-CM | POA: Diagnosis not present

## 2015-07-06 DIAGNOSIS — Z7902 Long term (current) use of antithrombotics/antiplatelets: Secondary | ICD-10-CM | POA: Diagnosis not present

## 2015-07-06 DIAGNOSIS — Z79891 Long term (current) use of opiate analgesic: Secondary | ICD-10-CM | POA: Diagnosis not present

## 2015-07-07 ENCOUNTER — Telehealth: Payer: Self-pay | Admitting: *Deleted

## 2015-07-07 NOTE — Telephone Encounter (Addendum)
[facility] left v/m to ck on duplicate therapy of pantoprazole and omeprazole;  pt is taking both meds. Request cb to Clarise Cruz pharmacist at [facility].

## 2015-07-07 NOTE — Telephone Encounter (Signed)
Fax received from [facility] concerning duplicate therapy:  Pantoprazole - Omeprazole.  Form in Dr. Buckner [place] In [place].

## 2015-07-08 NOTE — Telephone Encounter (Signed)
Stop omeprazole, continue pantoprazole, form done.  Thanks.

## 2015-07-08 NOTE — Telephone Encounter (Signed)
Completed form faxed to [facility].  [name] with [facility] notified as instructed by telephone and verbalized understanding.

## 2015-07-13 ENCOUNTER — Other Ambulatory Visit (INDEPENDENT_AMBULATORY_CARE_PROVIDER_SITE_OTHER): Payer: Self-pay | Admitting: Family Medicine

## 2015-07-13 ENCOUNTER — Telehealth: Payer: Self-pay

## 2015-07-13 DIAGNOSIS — R319 Hematuria, unspecified: Secondary | ICD-10-CM

## 2015-07-13 LAB — POC URINALSYSI DIPSTICK (AUTOMATED)
Bilirubin, UA: NEGATIVE
Glucose, UA: NEGATIVE
KETONES UA: NEGATIVE
Nitrite, UA: NEGATIVE
PH UA: 5.5
Spec Grav, UA: >=1.03
UROBILINOGEN UA: 1

## 2015-07-13 NOTE — Telephone Encounter (Signed)
Wife advised.  She does not have a sterile container from here but she can use a glass jar from home that has been run through the dishwasher.  She states she can probably come for the container while the housekeeper is there and will do so if she can, otherwise will bring it in what she has on hand.

## 2015-07-13 NOTE — Telephone Encounter (Signed)
Mrs Yan said pt has blood in urine since 07/11/15;no pain or burning upon urination, pt is not urinating more often than usual. No abdominal or back pain. Does not think pt has any fever. Mrs Wyke said no more confusion than usual. Pt is sleeping a lot lately.  Mrs Banko said cannot bring pt to office due to no one to help her get pt up and in the car. Mrs Hector wants to know if can bring urine specimen to office.

## 2015-07-13 NOTE — Telephone Encounter (Signed)
Noted. Thanks.

## 2015-07-13 NOTE — Telephone Encounter (Signed)
I put in the order for ua and ucx.  Do they have a sterile container?  If not then they need to pick that up first.  If his urine gets bloodier, then skip the next dose of plavix.   If any fever or clinical worsening, then needs eval.  Thanks.

## 2015-07-14 ENCOUNTER — Ambulatory Visit (INDEPENDENT_AMBULATORY_CARE_PROVIDER_SITE_OTHER): Payer: PPO

## 2015-07-14 VITALS — BP 92/68 | HR 83 | Temp 97.7°F

## 2015-07-14 DIAGNOSIS — Z23 Encounter for immunization: Secondary | ICD-10-CM | POA: Diagnosis not present

## 2015-07-14 DIAGNOSIS — Z0000 Encounter for general adult medical examination without abnormal findings: Secondary | ICD-10-CM

## 2015-07-14 NOTE — Progress Notes (Signed)
I reviewed health advisor's note, was available for consultation, and agree with documentation and plan.  

## 2015-07-14 NOTE — Progress Notes (Signed)
Pre visit review using our clinic review tool, if applicable. No additional management support is needed unless otherwise documented below in the visit note. 

## 2015-07-14 NOTE — Progress Notes (Signed)
Subjective:   [name] is a 82 y.o. male who presents for Medicare Annual/Subsequent preventive examination.   Cardiac Risk Factors include: advanced age (>62men, >21 women);hypertension;male gender;sedentary lifestyle     Objective:    Vitals: BP 92/68 mmHg  Pulse 83  Temp(Src) 97.7 F (36.5 C) (Oral)  Ht   Wt   SpO2 97%  There is no weight on file to calculate BMI.  Tobacco History  Smoking status  . Never Smoker   Smokeless tobacco  . Never Used     Counseling given: No   Past Medical History  Diagnosis Date  . Hypertension   . Hypothyroidism   . Diverticulosis of colon   . BPH (benign prostatic hypertrophy)   . Pancreatitis 2011    and gallstone pancreatitis and Ecoli bacteremia  . 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 followed by [name]- observation as of 09/2010  . Rabbit fever 1940  . History of shingles   . Dementia   . Pneumonia     2016  . Stroke ([place]) 02/2015    with L sided weakness.    Past Surgical History  Procedure Laterality Date  . Appendectomy  1959  . Thyroidectomy, partial  1966  . Knee arthroscopy  06/1995    left Dr Derrel Nip  . Joint replacement  04/30/2001    bilateral TKR  . Cholecystectomy  10/2009  . Eye muscle surgery      12/12 had double vision Dr. Juleen China at [facility]  . Orif hip fracture Left 12/26/2014    Procedure: OPEN REDUCTION INTERNAL FIXATION HIP;  Surgeon: Paralee Cancel, MD;  [facility];  Service: Orthopedics;  Laterality: Left;  . Open reduction internal fixation (orif) distal radial fracture Left 03/16/2015    Procedure: OPEN REDUCTION INTERNAL FIXATION (ORIF) DISTAL RADIAL FRACTURE;  Surgeon: Iran Planas, MD;  Location: [place];  Service: Orthopedics;  Laterality: Left;  . Orif femur fracture Left 03/16/2015    Procedure: OPEN REDUCTION INTERNAL FIXATION (ORIF) DISTAL FEMUR FRACTURE;  Surgeon: Paralee Cancel, MD;  Location: [place];  Service: Orthopedics;  Laterality: Left;   Family  History  Problem Relation Age of Onset  . Hypertension Mother   . Stroke Mother   . Alcohol abuse Father   . Cancer Brother     prostate CA  . Heart disease Brother    History  Sexual Activity  . Sexual Activity: No    Outpatient Encounter Prescriptions as of 07/14/2015  Medication Sig  . amLODipine (NORVASC) 10 MG tablet   . B Complex-C (B-COMPLEX WITH VITAMIN C) tablet Take 1 tablet by mouth daily.  . clopidogrel (PLAVIX) 75 MG tablet Take 1 tablet (75 mg total) by mouth daily.  Marland Kitchen docusate sodium (COLACE) 100 MG capsule Take 1 capsule (100 mg total) by mouth 2 (two) times daily.  Marland Kitchen donepezil (ARICEPT) 10 MG tablet Take 1 tablet (10 mg total) by mouth daily.  Marland Kitchen escitalopram (LEXAPRO) 5 MG tablet Take 1 tablet (5 mg total) by mouth daily.  Marland Kitchen levothyroxine (SYNTHROID, LEVOTHROID) 100 MCG tablet TAKE 100 MCG BY MOUTH DAILY  . Multiple Vitamin (MULTIVITAMIN) tablet Take 1 tablet by mouth daily.    . ondansetron (ZOFRAN) 4 MG tablet Take 1 tablet (4 mg total) by mouth every 6 (six) hours as needed for nausea.  . pantoprazole (PROTONIX) 40 MG tablet Take 1 tablet (40 mg total) by mouth daily.  . polyethylene glycol (MIRALAX / GLYCOLAX)  packet Take 17 g by mouth daily. (Patient taking differently: Take 17 g by mouth daily as needed. )  . Probiotic Product (PROBIOTIC PO) Take 1 tablet by mouth daily.  Marland Kitchen terazosin (HYTRIN) 5 MG capsule   . [DISCONTINUED] naproxen sodium (ANAPROX) 275 MG tablet Take 275 mg by mouth 2 (two) times daily with a meal.   No facility-administered encounter medications on file as of 07/14/2015.    Activities of Daily Living In your present state of health, do you have any difficulty performing the following activities: 07/14/2015 04/03/2015  Hearing? Tempie Donning  Vision? Y Y  Difficulty concentrating or making decisions? Tempie Donning  Walking or climbing stairs? Y Y  Dressing or bathing? Y Y  Doing errands, shopping? Tempie Donning  Preparing Food and eating ? Y Y  Using the Toilet? Y Y   In the past six months, have you accidently leaked urine? Y Y  Do you have problems with loss of bowel control? N Y  Managing your Medications? Y Y  Managing your Finances? Tempie Donning  Housekeeping or managing your Housekeeping? Tempie Donning    Patient Care Team: Tonia Ghent, MD as PCP - General Ronald Lobo, MD as Consulting Physician (Gastroenterology)   Assessment:      Exercise Activities and Dietary recommendations Current Exercise Habits: The patient does not participate in regular exercise at present (currently working with a physical therapist), Exercise limited by: orthopedic condition(s)  Fall Risk Fall Risk  07/14/2015 06/26/2015 04/28/2015 04/03/2015 09/27/2013  Falls in the past year? Yes No Yes Yes Yes  Number falls in past yr: 2 or more - 1 2 or more -  Injury with Fall? Yes - Yes Yes -  Risk Factor Category  High Fall Risk - High Fall Risk High Fall Risk -  Risk for fall due to : Impaired balance/gait;Impaired mobility;History of fall(s);Impaired vision;Mental status change - - History of fall(s);Impaired mobility;Impaired balance/gait -  Follow up Falls evaluation completed - Education provided Falls prevention discussed -   Depression Screen PHQ 2/9 Scores 07/14/2015 04/03/2015 09/27/2013  PHQ - 2 Score 6 6 4   PHQ- 9 Score 9 12 -    Cognitive Testing MMSE - Mini Mental State Exam 07/14/2015  Not completed: Unable to complete  Note: Pt has dx of dementia.   Immunization History  Administered Date(s) Administered  . Influenza Split 01/25/2011, 01/04/2012  . Influenza Whole 01/03/2004, 02/16/2007, 01/08/2008, 01/19/2009, 02/03/2010  . Influenza,inj,Quad PF,36+ Mos 12/30/2012, 12/23/2013  . Influenza-Unspecified 12/28/2014  . Pneumococcal Conjugate-13 07/14/2015  . Pneumococcal Polysaccharide-23 02/03/1999  . Td 10/02/2004  . Tdap 11/29/2014  . Zoster 09/17/2010   Screening Tests Health Maintenance  Topic Date Due  . INFLUENZA VACCINE  11/03/2015  . TETANUS/TDAP   11/28/2024  . ZOSTAVAX  Completed  . PNA vac Low Risk Adult  Completed      Plan:     I have personally reviewed and addressed the Medicare Annual Wellness questionnaire and have noted the following in the patient's chart:  A. Medical and social history B. Use of alcohol, tobacco or illicit drugs  C. Current medications and supplements D. Functional ability and status E.  Nutritional status F.  Physical activity G. Advance directives H. List of other physicians I.  Hospitalizations, surgeries, and ER visits in previous 12 months J.  [place] include hearing, vision, cognitive, depression L. Referrals and appointments - none  In addition, I have reviewed and discussed with patient certain preventive  protocols, quality metrics, and best practice recommendations. A written personalized care plan for preventive services as well as general preventive health recommendations were provided to patient.  See attached scanned questionnaire for additional information.   Signed,   Lindell Noe, MHA, BS, LPN Health Advisor [id number]

## 2015-07-14 NOTE — Patient Instructions (Signed)
Mr. Majchrzak , Thank you for taking time to come for your Medicare Wellness Visit. I appreciate your ongoing commitment to your health goals. Please review the following plan we discussed and let me know if I can assist you in the future.    This is a list of the screening recommended for you and due dates:  Health Maintenance  Topic Date Due  . Flu Shot  11/03/2015  . Tetanus Vaccine  11/28/2024  . Shingles Vaccine  Completed  . Pneumonia vaccines  Completed    Preventive Care for Adults  A healthy lifestyle and preventive care can promote health and wellness. Preventive health guidelines for adults include the following key practices.  . A routine yearly physical is a good way to check with your health care provider about your health and preventive screening. It is a chance to share any concerns and updates on your health and to receive a thorough exam.  . Visit your dentist for a routine exam and preventive care every 6 months. Brush your teeth twice a day and floss once a day. Good oral hygiene prevents tooth decay and gum disease.  . The frequency of eye exams is based on your age, health, family medical history, use  of contact lenses, and other factors. Follow your health care provider's ecommendations for frequency of eye exams.  . Eat a healthy diet. Foods like vegetables, fruits, whole grains, low-fat dairy products, and lean protein foods contain the nutrients you need without too many calories. Decrease your intake of foods high in solid fats, added sugars, and salt. Eat the right amount of calories for you. Get information about a proper diet from your health care provider, if necessary.  . Regular physical exercise is one of the most important things you can do for your health. Most adults should get at least 150 minutes of moderate-intensity exercise (any activity that increases your heart rate and causes you to sweat) each week. In addition, most adults need muscle-strengthening  exercises on 2 or more days a week.  Silver Sneakers Fallert be a benefit available to you. To determine eligibility, you [place] visit the website: www.silversneakers.com or contact program at [phone] Mon-Fri between 8AM-8PM.   . Maintain a healthy weight. The body mass index (BMI) is a screening tool to identify possible weight problems. It provides an estimate of body fat based on height and weight. Your health care provider can find your BMI and can help you achieve or maintain a healthy weight.   For adults 20 years and older: ? A BMI below 18.5 is considered underweight. ? A BMI of 18.5 to 24.9 is normal. ? A BMI of 25 to 29.9 is considered overweight. ? A BMI of 30 and above is considered obese.   . Maintain normal blood lipids and cholesterol levels by exercising and minimizing your intake of saturated fat. Eat a balanced diet with plenty of fruit and vegetables. Blood tests for lipids and cholesterol should begin at age 45 and be repeated every 5 years. If your lipid or cholesterol levels are high, you are over 50, or you are at high risk for heart disease, you [place] need your cholesterol levels checked more frequently. Ongoing high lipid and cholesterol levels should be treated with medicines if diet and exercise are not working.  . If you smoke, find out from your health care provider how to quit. If you do not use tobacco, please do not start.  . If you choose to drink  alcohol, please do not consume more than 2 drinks per day. One drink is considered to be 12 ounces (355 mL) of beer, 5 ounces (148 mL) of wine, or 1.5 ounces (44 mL) of liquor.  . If you are 42-45 years old, ask your health care provider if you should take aspirin to prevent strokes.  . Use sunscreen. Apply sunscreen liberally and repeatedly throughout the day. You should seek shade when your shadow is shorter than you. Protect yourself by wearing long sleeves, pants, a wide-brimmed hat, and sunglasses year round,  whenever you are outdoors.  . Once a month, do a whole body skin exam, using a mirror to look at the skin on your back. Tell your health care provider of new moles, moles that have irregular borders, moles that are larger than a pencil eraser, or moles that have changed in shape or color.

## 2015-07-15 ENCOUNTER — Other Ambulatory Visit: Payer: Self-pay | Admitting: Family Medicine

## 2015-07-15 DIAGNOSIS — Z9181 History of falling: Secondary | ICD-10-CM | POA: Diagnosis not present

## 2015-07-15 DIAGNOSIS — S72142D Displaced intertrochanteric fracture of left femur, subsequent encounter for closed fracture with routine healing: Secondary | ICD-10-CM | POA: Diagnosis not present

## 2015-07-15 DIAGNOSIS — S52372D Galeazzi's fracture of left radius, subsequent encounter for closed fracture with routine healing: Secondary | ICD-10-CM | POA: Diagnosis not present

## 2015-07-15 DIAGNOSIS — Z79891 Long term (current) use of opiate analgesic: Secondary | ICD-10-CM | POA: Diagnosis not present

## 2015-07-15 DIAGNOSIS — I10 Essential (primary) hypertension: Secondary | ICD-10-CM | POA: Diagnosis not present

## 2015-07-15 DIAGNOSIS — M9702XD Periprosthetic fracture around internal prosthetic left hip joint, subsequent encounter: Secondary | ICD-10-CM | POA: Diagnosis not present

## 2015-07-15 DIAGNOSIS — Z96653 Presence of artificial knee joint, bilateral: Secondary | ICD-10-CM | POA: Diagnosis not present

## 2015-07-15 DIAGNOSIS — R319 Hematuria, unspecified: Secondary | ICD-10-CM

## 2015-07-15 DIAGNOSIS — W19XXXD Unspecified fall, subsequent encounter: Secondary | ICD-10-CM | POA: Diagnosis not present

## 2015-07-15 DIAGNOSIS — F0390 Unspecified dementia without behavioral disturbance: Secondary | ICD-10-CM | POA: Diagnosis not present

## 2015-07-15 DIAGNOSIS — I69352 Hemiplegia and hemiparesis following cerebral infarction affecting left dominant side: Secondary | ICD-10-CM | POA: Diagnosis not present

## 2015-07-15 DIAGNOSIS — Z7902 Long term (current) use of antithrombotics/antiplatelets: Secondary | ICD-10-CM | POA: Diagnosis not present

## 2015-07-15 LAB — URINE CULTURE: Colony Count: 70000

## 2015-07-18 DIAGNOSIS — R269 Unspecified abnormalities of gait and mobility: Secondary | ICD-10-CM | POA: Diagnosis not present

## 2015-07-18 DIAGNOSIS — F0390 Unspecified dementia without behavioral disturbance: Secondary | ICD-10-CM | POA: Diagnosis not present

## 2015-07-18 DIAGNOSIS — Z4789 Encounter for other orthopedic aftercare: Secondary | ICD-10-CM | POA: Diagnosis not present

## 2015-07-18 DIAGNOSIS — J9691 Respiratory failure, unspecified with hypoxia: Secondary | ICD-10-CM | POA: Diagnosis not present

## 2015-07-18 DIAGNOSIS — I10 Essential (primary) hypertension: Secondary | ICD-10-CM | POA: Diagnosis not present

## 2015-07-18 DIAGNOSIS — Z9181 History of falling: Secondary | ICD-10-CM | POA: Diagnosis not present

## 2015-07-18 DIAGNOSIS — K5900 Constipation, unspecified: Secondary | ICD-10-CM | POA: Diagnosis not present

## 2015-07-18 DIAGNOSIS — E890 Postprocedural hypothyroidism: Secondary | ICD-10-CM | POA: Diagnosis not present

## 2015-07-18 DIAGNOSIS — J188 Other pneumonia, unspecified organism: Secondary | ICD-10-CM | POA: Diagnosis not present

## 2015-07-18 DIAGNOSIS — D62 Acute posthemorrhagic anemia: Secondary | ICD-10-CM | POA: Diagnosis not present

## 2015-07-18 DIAGNOSIS — S72145D Nondisplaced intertrochanteric fracture of left femur, subsequent encounter for closed fracture with routine healing: Secondary | ICD-10-CM | POA: Diagnosis not present

## 2015-07-18 DIAGNOSIS — K219 Gastro-esophageal reflux disease without esophagitis: Secondary | ICD-10-CM | POA: Diagnosis not present

## 2015-07-21 DIAGNOSIS — Z9181 History of falling: Secondary | ICD-10-CM | POA: Diagnosis not present

## 2015-07-21 DIAGNOSIS — W19XXXD Unspecified fall, subsequent encounter: Secondary | ICD-10-CM | POA: Diagnosis not present

## 2015-07-21 DIAGNOSIS — I69352 Hemiplegia and hemiparesis following cerebral infarction affecting left dominant side: Secondary | ICD-10-CM | POA: Diagnosis not present

## 2015-07-21 DIAGNOSIS — F0390 Unspecified dementia without behavioral disturbance: Secondary | ICD-10-CM | POA: Diagnosis not present

## 2015-07-21 DIAGNOSIS — Z79891 Long term (current) use of opiate analgesic: Secondary | ICD-10-CM | POA: Diagnosis not present

## 2015-07-21 DIAGNOSIS — I10 Essential (primary) hypertension: Secondary | ICD-10-CM | POA: Diagnosis not present

## 2015-07-21 DIAGNOSIS — S72142D Displaced intertrochanteric fracture of left femur, subsequent encounter for closed fracture with routine healing: Secondary | ICD-10-CM | POA: Diagnosis not present

## 2015-07-21 DIAGNOSIS — S52372D Galeazzi's fracture of left radius, subsequent encounter for closed fracture with routine healing: Secondary | ICD-10-CM | POA: Diagnosis not present

## 2015-07-21 DIAGNOSIS — Z7902 Long term (current) use of antithrombotics/antiplatelets: Secondary | ICD-10-CM | POA: Diagnosis not present

## 2015-07-21 DIAGNOSIS — Z96653 Presence of artificial knee joint, bilateral: Secondary | ICD-10-CM | POA: Diagnosis not present

## 2015-07-21 DIAGNOSIS — Z792 Long term (current) use of antibiotics: Secondary | ICD-10-CM | POA: Diagnosis not present

## 2015-07-21 DIAGNOSIS — M9702XD Periprosthetic fracture around internal prosthetic left hip joint, subsequent encounter: Secondary | ICD-10-CM | POA: Diagnosis not present

## 2015-07-22 DIAGNOSIS — Z96653 Presence of artificial knee joint, bilateral: Secondary | ICD-10-CM | POA: Diagnosis not present

## 2015-07-22 DIAGNOSIS — I10 Essential (primary) hypertension: Secondary | ICD-10-CM | POA: Diagnosis not present

## 2015-07-22 DIAGNOSIS — S52372D Galeazzi's fracture of left radius, subsequent encounter for closed fracture with routine healing: Secondary | ICD-10-CM | POA: Diagnosis not present

## 2015-07-22 DIAGNOSIS — F0390 Unspecified dementia without behavioral disturbance: Secondary | ICD-10-CM | POA: Diagnosis not present

## 2015-07-22 DIAGNOSIS — K5900 Constipation, unspecified: Secondary | ICD-10-CM | POA: Diagnosis not present

## 2015-07-22 DIAGNOSIS — S72142D Displaced intertrochanteric fracture of left femur, subsequent encounter for closed fracture with routine healing: Secondary | ICD-10-CM | POA: Diagnosis not present

## 2015-07-22 DIAGNOSIS — D62 Acute posthemorrhagic anemia: Secondary | ICD-10-CM | POA: Diagnosis not present

## 2015-07-22 DIAGNOSIS — K219 Gastro-esophageal reflux disease without esophagitis: Secondary | ICD-10-CM | POA: Diagnosis not present

## 2015-07-22 DIAGNOSIS — Z79891 Long term (current) use of opiate analgesic: Secondary | ICD-10-CM | POA: Diagnosis not present

## 2015-07-22 DIAGNOSIS — Z7902 Long term (current) use of antithrombotics/antiplatelets: Secondary | ICD-10-CM | POA: Diagnosis not present

## 2015-07-22 DIAGNOSIS — S72145D Nondisplaced intertrochanteric fracture of left femur, subsequent encounter for closed fracture with routine healing: Secondary | ICD-10-CM | POA: Diagnosis not present

## 2015-07-22 DIAGNOSIS — M9702XD Periprosthetic fracture around internal prosthetic left hip joint, subsequent encounter: Secondary | ICD-10-CM | POA: Diagnosis not present

## 2015-07-22 DIAGNOSIS — Z9181 History of falling: Secondary | ICD-10-CM | POA: Diagnosis not present

## 2015-07-22 DIAGNOSIS — W19XXXD Unspecified fall, subsequent encounter: Secondary | ICD-10-CM | POA: Diagnosis not present

## 2015-07-22 DIAGNOSIS — J188 Other pneumonia, unspecified organism: Secondary | ICD-10-CM | POA: Diagnosis not present

## 2015-07-22 DIAGNOSIS — I69352 Hemiplegia and hemiparesis following cerebral infarction affecting left dominant side: Secondary | ICD-10-CM | POA: Diagnosis not present

## 2015-07-22 DIAGNOSIS — E890 Postprocedural hypothyroidism: Secondary | ICD-10-CM | POA: Diagnosis not present

## 2015-07-22 DIAGNOSIS — J9691 Respiratory failure, unspecified with hypoxia: Secondary | ICD-10-CM | POA: Diagnosis not present

## 2015-07-22 DIAGNOSIS — Z4789 Encounter for other orthopedic aftercare: Secondary | ICD-10-CM | POA: Diagnosis not present

## 2015-07-22 DIAGNOSIS — Z792 Long term (current) use of antibiotics: Secondary | ICD-10-CM | POA: Diagnosis not present

## 2015-07-23 DIAGNOSIS — R310 Gross hematuria: Secondary | ICD-10-CM | POA: Diagnosis not present

## 2015-07-25 ENCOUNTER — Ambulatory Visit (HOSPITAL_COMMUNITY)
Admission: EM | Admit: 2015-07-25 | Discharge: 2015-07-25 | Disposition: A | Discharge status: Home / Self Care | Payer: PPO | Attending: Family Medicine | Admitting: Family Medicine

## 2015-07-25 ENCOUNTER — Encounter (HOSPITAL_COMMUNITY): Payer: Self-pay | Admitting: *Deleted

## 2015-07-25 DIAGNOSIS — Z8673 Personal history of transient ischemic attack (TIA), and cerebral infarction without residual deficits: Secondary | ICD-10-CM | POA: Diagnosis not present

## 2015-07-25 DIAGNOSIS — L02214 Cutaneous abscess of groin: Secondary | ICD-10-CM | POA: Diagnosis not present

## 2015-07-25 DIAGNOSIS — I10 Essential (primary) hypertension: Secondary | ICD-10-CM | POA: Insufficient documentation

## 2015-07-25 DIAGNOSIS — Z9889 Other specified postprocedural states: Secondary | ICD-10-CM | POA: Diagnosis not present

## 2015-07-25 DIAGNOSIS — Z8619 Personal history of other infectious and parasitic diseases: Secondary | ICD-10-CM | POA: Insufficient documentation

## 2015-07-25 DIAGNOSIS — E039 Hypothyroidism, unspecified: Secondary | ICD-10-CM | POA: Insufficient documentation

## 2015-07-25 DIAGNOSIS — Z888 Allergy status to other drugs, medicaments and biological substances status: Secondary | ICD-10-CM | POA: Diagnosis not present

## 2015-07-25 DIAGNOSIS — K859 Acute pancreatitis without necrosis or infection, unspecified: Secondary | ICD-10-CM | POA: Insufficient documentation

## 2015-07-25 DIAGNOSIS — Z79899 Other long term (current) drug therapy: Secondary | ICD-10-CM | POA: Insufficient documentation

## 2015-07-25 DIAGNOSIS — F0390 Unspecified dementia without behavioral disturbance: Secondary | ICD-10-CM | POA: Insufficient documentation

## 2015-07-25 DIAGNOSIS — N400 Enlarged prostate without lower urinary tract symptoms: Secondary | ICD-10-CM | POA: Diagnosis not present

## 2015-07-25 HISTORY — DX: Unspecified multiple injuries, initial encounter: T07.XXXA

## 2015-07-25 MED ORDER — ACETAMINOPHEN 325 MG PO TABS
650.0000 mg | ORAL_TABLET | Freq: Once | ORAL | Status: AC
  Administered 2015-07-25: 650 mg via ORAL

## 2015-07-25 MED ORDER — ACETAMINOPHEN 325 MG PO TABS
ORAL_TABLET | ORAL | Status: AC
  Filled 2015-07-25: qty 2

## 2015-07-25 MED ORDER — DOXYCYCLINE HYCLATE 100 MG PO CAPS: 100.0000 mg | ORAL_CAPSULE | Freq: Two times a day (BID) | ORAL | Status: DC

## 2015-07-25 NOTE — ED Notes (Signed)
Assessment per Dr. Kindl. 

## 2015-07-25 NOTE — ED Provider Notes (Signed)
CSN: [id number]     Arrival date & time 07/25/15  1258 History   First MD Initiated Contact with Patient 07/25/15 1329     Chief Complaint  Patient presents with  . Abscess   (Consider location/radiation/quality/duration/timing/severity/associated sxs/prior Treatment) Patient is a 82 y.o. male presenting with abscess. The history is provided by the patient, the spouse and a relative.  Abscess Location:  Ano-genital Ano-genital abscess location:  Groin Abscess quality: fluctuance, painful, redness and warmth   Red streaking: no   Duration:  3 days Progression:  Worsening Pain details:    Severity:  Mild Relieved by:  None tried Worsened by:  Nothing tried Ineffective treatments:  None tried Associated symptoms: no fever     Past Medical History  Diagnosis Date  . Hypertension   . Hypothyroidism   . Diverticulosis of colon   . BPH (benign prostatic hypertrophy)   . Pancreatitis 2011    and gallstone pancreatitis and Ecoli bacteremia  . 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 followed by [name]- observation as of 09/2010  . Rabbit fever 1940  . History of shingles   . Dementia   . Pneumonia     2016  . Stroke ([place]) 02/2015    with L sided weakness.   . Multiple fractures    Past Surgical History  Procedure Laterality Date  . Appendectomy  1959  . Thyroidectomy, partial  1966  . Knee arthroscopy  06/1995    left Dr Derrel Nip  . Joint replacement  04/30/2001    bilateral TKR  . Cholecystectomy  10/2009  . Eye muscle surgery      12/12 had double vision Dr. Juleen China at [facility]  . Orif hip fracture Left 12/26/2014    Procedure: OPEN REDUCTION INTERNAL FIXATION HIP;  Surgeon: Paralee Cancel, MD;  [facility];  Service: Orthopedics;  Laterality: Left;  . Open reduction internal fixation (orif) distal radial fracture Left 03/16/2015    Procedure: OPEN REDUCTION INTERNAL FIXATION (ORIF) DISTAL RADIAL FRACTURE;  Surgeon: Iran Planas, MD;  Location: [place];   Service: Orthopedics;  Laterality: Left;  . Orif femur fracture Left 03/16/2015    Procedure: OPEN REDUCTION INTERNAL FIXATION (ORIF) DISTAL FEMUR FRACTURE;  Surgeon: Paralee Cancel, MD;  Location: [place];  Service: Orthopedics;  Laterality: Left;   Family History  Problem Relation Age of Onset  . Hypertension Mother   . Stroke Mother   . Alcohol abuse Father   . Cancer Brother     prostate CA  . Heart disease Brother    Social History  Substance Use Topics  . Smoking status: Never Smoker   . Smokeless tobacco: Never Used  . Alcohol Use: No    Review of Systems  Constitutional: Negative.  Negative for fever.  Skin: Positive for color change and wound.    Allergies  Sulfonamide derivatives and Valtrex  Home Medications   Prior to Admission medications   Medication Sig Start Date End Date Taking? Authorizing Provider  amLODipine (NORVASC) 10 MG tablet  04/25/15  Yes Historical Provider, MD  B Complex-C (B-COMPLEX WITH VITAMIN C) tablet Take 1 tablet by mouth daily. 07/13/12  Yes Ripudeep Krystal Eaton, MD  clopidogrel (PLAVIX) 75 MG tablet Take 1 tablet (75 mg total) by mouth daily. 04/23/15  Yes Tonia Ghent, MD  docusate sodium (COLACE) 100 MG capsule Take 1 capsule (100 mg total) by mouth 2 (two) times daily. 03/17/15  Yes Theodis Blaze,  MD  donepezil (ARICEPT) 10 MG tablet Take 1 tablet (10 mg total) by mouth daily. 04/23/15  Yes Tonia Ghent, MD  escitalopram (LEXAPRO) 5 MG tablet Take 1 tablet (5 mg total) by mouth daily. 04/23/15  Yes Tonia Ghent, MD  IRON PO Take by mouth.   Yes Historical Provider, MD  levothyroxine (SYNTHROID, LEVOTHROID) 100 MCG tablet TAKE 100 MCG BY MOUTH DAILY 04/23/15  Yes Tonia Ghent, MD  Multiple Vitamin (MULTIVITAMIN) tablet Take 1 tablet by mouth daily.     Yes Historical Provider, MD  omeprazole (PRILOSEC) 40 MG capsule Take 40 mg by mouth daily.   Yes Historical Provider, MD  Probiotic Product (PROBIOTIC PO) Take 1 tablet by mouth daily.    Yes Historical Provider, MD  terazosin (HYTRIN) 5 MG capsule  04/25/15  Yes Historical Provider, MD  doxycycline (VIBRAMYCIN) 100 MG capsule Take 1 capsule (100 mg total) by mouth 2 (two) times daily. 07/25/15   Billy Fischer, MD  ondansetron (ZOFRAN) 4 MG tablet Take 1 tablet (4 mg total) by mouth every 6 (six) hours as needed for nausea. 12/30/14   Christina P Rama, MD  pantoprazole (PROTONIX) 40 MG tablet Take 1 tablet (40 mg total) by mouth daily. 05/04/15   Tonia Ghent, MD  polyethylene glycol [facility] Floria Raveling) packet Take 17 g by mouth daily. Patient taking differently: Take 17 g by mouth daily as needed.  12/30/14   Venetia Maxon Rama, MD   Meds Ordered and Administered this Visit   Medications  acetaminophen (TYLENOL) tablet 650 mg (650 mg Oral Given 07/25/15 1404)    BP 93/60 mmHg  Pulse 82  Temp(Src) 97.6 F (36.4 C) (Oral)  Resp 18  SpO2 96% No data found.   Physical Exam  Constitutional: He appears well-developed and well-nourished. He appears distressed.  Neurological: He is alert.  Skin: Skin is warm. There is erythema.  Nursing note and vitals reviewed.   ED Course  .Marland KitchenIncision and Drainage Date/Time: 07/25/2015 1:57 PM Performed by: Billy Fischer Authorized by: Ihor Gully D Consent: Verbal consent obtained. Consent given by: spouse Type: abscess Body area: trunk Location details: abdomen Local anesthetic: topical anesthetic Patient sedated: no Scalpel size: 11 Incision type: single straight Drainage amount: moderate Wound treatment: wound left open Patient tolerance: Patient tolerated the procedure well with no immediate complications Comments: Culture obtained.   (including critical care time)  Labs Review Labs Reviewed  CULTURE, ROUTINE-ABSCESS    Imaging Review No results found.   Visual Acuity Review  Right Eye Distance:   Left Eye Distance:   Bilateral Distance:    Right Eye Near:   Left Eye Near:    Bilateral Near:          MDM   1. Abscess of groin, right        Billy Fischer, MD 07/25/15 1404

## 2015-07-25 NOTE — Discharge Instructions (Signed)
Warm compress twice a day when you take the antibiotic, take all of medicine, return as needed. °

## 2015-07-27 ENCOUNTER — Telehealth: Payer: Self-pay | Admitting: *Deleted

## 2015-07-27 NOTE — Telephone Encounter (Signed)
PLEASE NOTE: All timestamps contained within this report are represented as [place] Standard Time. CONFIDENTIALTY NOTICE: This fax transmission is intended only for the addressee. It contains information that is legally privileged, confidential or otherwise protected from use or disclosure. If you are not the intended recipient, you are strictly prohibited from reviewing, disclosing, copying using or disseminating any of this information or taking any action in reliance on or regarding this information. If you have received this fax in error, please notify us immediately by telephone so that we can arrange for its return to [place]. Phone: [phone], Toll-Free: [phone], Fax: [phone] Page: 1 of 2 Call Id: [id number] [name] Patient Name: [name] Gender: Male DOB: May 08, 1930 Age: 82 Y 78 M 3 D Return Phone Number: [phone] (Primary) Address: City/State/Zip: [place] Client [place] Night - Client Client Site [place] Physician [name] Contact Type Call Who Is Calling Patient / Member / Family / Caregiver Call Type Triage / Clinical Caller Name [name] Relationship To Patient Spouse Return Phone Number [phone] (Primary) Chief Complaint Sores Reason for Call Symptomatic / Request for [place] states husband has a place on his stomach that looks like a boil that is hurting and also is really red. PreDisposition Call Doctor Translation No Nurse Assessment Nurse: Christopher Merl, RN, Christopher Date/Time (Eastern Time): 07/25/2015 10:10:32 AM Confirm and document reason for call. If symptomatic, describe symptoms. You must click the next button to save text entered. ---Caller states husband has a place on his stomach that looks like a boil, she was able to get some pus out of it earlier in  the week and thought it was getting better. that is hurting and also is really red. Has the patient traveled out of the country within the last 30 days? ---Not Applicable Does the patient have any new or worsening symptoms? ---Yes Will a triage be completed? ---Yes Related visit to physician within the last 2 weeks? ---No Does the PT have any chronic conditions? (i.e. diabetes, asthma, etc.) ---Unknown Is this a behavioral health or substance abuse call? ---No Guidelines Guideline Title Affirmed Question Affirmed Notes Nurse Date/Time (Eastern Time) Boil (Skin Abscess) SEVERE pain (e.g., excruciating) Standifer, RN, Christopher 07/25/2015 10:11:16 AM Disp. Time [name] Time) Disposition Final User 07/25/2015 10:15:15 AM Call Completed Standifer, RN, [name] 07/25/2015 10:14:27 AM See Physician within 4 Hours (or PCP triage) Yes Standifer, RN, Christopher PLEASE NOTE: All timestamps contained within this report are represented as [place] Standard Time. CONFIDENTIALTY NOTICE: This fax transmission is intended only for the addressee. It contains information that is legally privileged, confidential or otherwise protected from use or disclosure. If you are not the intended recipient, you are strictly prohibited from reviewing, disclosing, copying using or disseminating any of this information or taking any action in reliance on or regarding this information. If you have received this fax in error, please notify us immediately by telephone so that we can arrange for its return to [place]. Phone: [phone], Toll-Free: [phone], Fax: [phone] Page: 2 of 2 Call Id: [id number] [name] Understands: Yes Disagree/Comply: Comply Care Advice Given Per Guideline SEE PHYSICIAN WITHIN 4 HOURS (or PCP triage): * IF OFFICE WILL BE OPEN: You need to be seen within the next 3 or 4 hours. Call your doctor's office now or as soon as it opens. CARE ADVICE per Boil and Abscess (Adult) guideline. CALL  BACK IF: *  You become worse. Referrals [facility]

## 2015-07-27 NOTE — Telephone Encounter (Signed)
Noted. Thanks.

## 2015-07-27 NOTE — Telephone Encounter (Signed)
Patient was seen at [facility] 07/25/15 for I&D.

## 2015-07-28 DIAGNOSIS — Z9181 History of falling: Secondary | ICD-10-CM | POA: Diagnosis not present

## 2015-07-28 DIAGNOSIS — Z7902 Long term (current) use of antithrombotics/antiplatelets: Secondary | ICD-10-CM | POA: Diagnosis not present

## 2015-07-28 DIAGNOSIS — W19XXXD Unspecified fall, subsequent encounter: Secondary | ICD-10-CM | POA: Diagnosis not present

## 2015-07-28 DIAGNOSIS — I10 Essential (primary) hypertension: Secondary | ICD-10-CM | POA: Diagnosis not present

## 2015-07-28 DIAGNOSIS — F0390 Unspecified dementia without behavioral disturbance: Secondary | ICD-10-CM | POA: Diagnosis not present

## 2015-07-28 DIAGNOSIS — S52372D Galeazzi's fracture of left radius, subsequent encounter for closed fracture with routine healing: Secondary | ICD-10-CM | POA: Diagnosis not present

## 2015-07-28 DIAGNOSIS — S72142D Displaced intertrochanteric fracture of left femur, subsequent encounter for closed fracture with routine healing: Secondary | ICD-10-CM | POA: Diagnosis not present

## 2015-07-28 DIAGNOSIS — M9702XD Periprosthetic fracture around internal prosthetic left hip joint, subsequent encounter: Secondary | ICD-10-CM | POA: Diagnosis not present

## 2015-07-28 DIAGNOSIS — Z79891 Long term (current) use of opiate analgesic: Secondary | ICD-10-CM | POA: Diagnosis not present

## 2015-07-28 DIAGNOSIS — I69352 Hemiplegia and hemiparesis following cerebral infarction affecting left dominant side: Secondary | ICD-10-CM | POA: Diagnosis not present

## 2015-07-28 DIAGNOSIS — Z96653 Presence of artificial knee joint, bilateral: Secondary | ICD-10-CM | POA: Diagnosis not present

## 2015-07-28 LAB — CULTURE, ROUTINE-ABSCESS
CULTURE: NORMAL
SPECIAL REQUESTS: NORMAL

## 2015-08-03 DIAGNOSIS — F0390 Unspecified dementia without behavioral disturbance: Secondary | ICD-10-CM | POA: Diagnosis not present

## 2015-08-03 DIAGNOSIS — M9702XD Periprosthetic fracture around internal prosthetic left hip joint, subsequent encounter: Secondary | ICD-10-CM | POA: Diagnosis not present

## 2015-08-03 DIAGNOSIS — S72142D Displaced intertrochanteric fracture of left femur, subsequent encounter for closed fracture with routine healing: Secondary | ICD-10-CM | POA: Diagnosis not present

## 2015-08-03 DIAGNOSIS — Z9181 History of falling: Secondary | ICD-10-CM | POA: Diagnosis not present

## 2015-08-03 DIAGNOSIS — S52372D Galeazzi's fracture of left radius, subsequent encounter for closed fracture with routine healing: Secondary | ICD-10-CM | POA: Diagnosis not present

## 2015-08-03 DIAGNOSIS — Z96653 Presence of artificial knee joint, bilateral: Secondary | ICD-10-CM | POA: Diagnosis not present

## 2015-08-03 DIAGNOSIS — Z7902 Long term (current) use of antithrombotics/antiplatelets: Secondary | ICD-10-CM | POA: Diagnosis not present

## 2015-08-03 DIAGNOSIS — Z792 Long term (current) use of antibiotics: Secondary | ICD-10-CM | POA: Diagnosis not present

## 2015-08-03 DIAGNOSIS — W19XXXD Unspecified fall, subsequent encounter: Secondary | ICD-10-CM | POA: Diagnosis not present

## 2015-08-03 DIAGNOSIS — I10 Essential (primary) hypertension: Secondary | ICD-10-CM | POA: Diagnosis not present

## 2015-08-03 DIAGNOSIS — Z79891 Long term (current) use of opiate analgesic: Secondary | ICD-10-CM | POA: Diagnosis not present

## 2015-08-03 DIAGNOSIS — I69352 Hemiplegia and hemiparesis following cerebral infarction affecting left dominant side: Secondary | ICD-10-CM | POA: Diagnosis not present

## 2015-08-06 DIAGNOSIS — S52372D Galeazzi's fracture of left radius, subsequent encounter for closed fracture with routine healing: Secondary | ICD-10-CM | POA: Diagnosis not present

## 2015-08-06 DIAGNOSIS — I10 Essential (primary) hypertension: Secondary | ICD-10-CM | POA: Diagnosis not present

## 2015-08-06 DIAGNOSIS — Z9181 History of falling: Secondary | ICD-10-CM | POA: Diagnosis not present

## 2015-08-06 DIAGNOSIS — Z792 Long term (current) use of antibiotics: Secondary | ICD-10-CM | POA: Diagnosis not present

## 2015-08-06 DIAGNOSIS — Z79891 Long term (current) use of opiate analgesic: Secondary | ICD-10-CM | POA: Diagnosis not present

## 2015-08-06 DIAGNOSIS — M9702XD Periprosthetic fracture around internal prosthetic left hip joint, subsequent encounter: Secondary | ICD-10-CM | POA: Diagnosis not present

## 2015-08-06 DIAGNOSIS — Z7902 Long term (current) use of antithrombotics/antiplatelets: Secondary | ICD-10-CM | POA: Diagnosis not present

## 2015-08-06 DIAGNOSIS — Z96653 Presence of artificial knee joint, bilateral: Secondary | ICD-10-CM | POA: Diagnosis not present

## 2015-08-06 DIAGNOSIS — F0390 Unspecified dementia without behavioral disturbance: Secondary | ICD-10-CM | POA: Diagnosis not present

## 2015-08-06 DIAGNOSIS — I69352 Hemiplegia and hemiparesis following cerebral infarction affecting left dominant side: Secondary | ICD-10-CM | POA: Diagnosis not present

## 2015-08-06 DIAGNOSIS — W19XXXD Unspecified fall, subsequent encounter: Secondary | ICD-10-CM | POA: Diagnosis not present

## 2015-08-06 DIAGNOSIS — S72142D Displaced intertrochanteric fracture of left femur, subsequent encounter for closed fracture with routine healing: Secondary | ICD-10-CM | POA: Diagnosis not present

## 2015-08-10 DIAGNOSIS — S72142D Displaced intertrochanteric fracture of left femur, subsequent encounter for closed fracture with routine healing: Secondary | ICD-10-CM | POA: Diagnosis not present

## 2015-08-10 DIAGNOSIS — Z79891 Long term (current) use of opiate analgesic: Secondary | ICD-10-CM | POA: Diagnosis not present

## 2015-08-10 DIAGNOSIS — W19XXXD Unspecified fall, subsequent encounter: Secondary | ICD-10-CM | POA: Diagnosis not present

## 2015-08-10 DIAGNOSIS — Z96653 Presence of artificial knee joint, bilateral: Secondary | ICD-10-CM | POA: Diagnosis not present

## 2015-08-10 DIAGNOSIS — Z7902 Long term (current) use of antithrombotics/antiplatelets: Secondary | ICD-10-CM | POA: Diagnosis not present

## 2015-08-10 DIAGNOSIS — Z9181 History of falling: Secondary | ICD-10-CM | POA: Diagnosis not present

## 2015-08-10 DIAGNOSIS — F0390 Unspecified dementia without behavioral disturbance: Secondary | ICD-10-CM | POA: Diagnosis not present

## 2015-08-10 DIAGNOSIS — I10 Essential (primary) hypertension: Secondary | ICD-10-CM | POA: Diagnosis not present

## 2015-08-10 DIAGNOSIS — M9702XD Periprosthetic fracture around internal prosthetic left hip joint, subsequent encounter: Secondary | ICD-10-CM | POA: Diagnosis not present

## 2015-08-10 DIAGNOSIS — S52372D Galeazzi's fracture of left radius, subsequent encounter for closed fracture with routine healing: Secondary | ICD-10-CM | POA: Diagnosis not present

## 2015-08-10 DIAGNOSIS — I69352 Hemiplegia and hemiparesis following cerebral infarction affecting left dominant side: Secondary | ICD-10-CM | POA: Diagnosis not present

## 2015-08-10 DIAGNOSIS — Z792 Long term (current) use of antibiotics: Secondary | ICD-10-CM | POA: Diagnosis not present

## 2015-08-17 DIAGNOSIS — D62 Acute posthemorrhagic anemia: Secondary | ICD-10-CM | POA: Diagnosis not present

## 2015-08-17 DIAGNOSIS — E890 Postprocedural hypothyroidism: Secondary | ICD-10-CM | POA: Diagnosis not present

## 2015-08-17 DIAGNOSIS — I69352 Hemiplegia and hemiparesis following cerebral infarction affecting left dominant side: Secondary | ICD-10-CM | POA: Diagnosis not present

## 2015-08-17 DIAGNOSIS — W19XXXD Unspecified fall, subsequent encounter: Secondary | ICD-10-CM | POA: Diagnosis not present

## 2015-08-17 DIAGNOSIS — Z9181 History of falling: Secondary | ICD-10-CM | POA: Diagnosis not present

## 2015-08-17 DIAGNOSIS — I10 Essential (primary) hypertension: Secondary | ICD-10-CM | POA: Diagnosis not present

## 2015-08-17 DIAGNOSIS — Z7902 Long term (current) use of antithrombotics/antiplatelets: Secondary | ICD-10-CM | POA: Diagnosis not present

## 2015-08-17 DIAGNOSIS — M9702XD Periprosthetic fracture around internal prosthetic left hip joint, subsequent encounter: Secondary | ICD-10-CM | POA: Diagnosis not present

## 2015-08-17 DIAGNOSIS — J188 Other pneumonia, unspecified organism: Secondary | ICD-10-CM | POA: Diagnosis not present

## 2015-08-17 DIAGNOSIS — Z96653 Presence of artificial knee joint, bilateral: Secondary | ICD-10-CM | POA: Diagnosis not present

## 2015-08-17 DIAGNOSIS — Z4789 Encounter for other orthopedic aftercare: Secondary | ICD-10-CM | POA: Diagnosis not present

## 2015-08-17 DIAGNOSIS — R269 Unspecified abnormalities of gait and mobility: Secondary | ICD-10-CM | POA: Diagnosis not present

## 2015-08-17 DIAGNOSIS — Z792 Long term (current) use of antibiotics: Secondary | ICD-10-CM | POA: Diagnosis not present

## 2015-08-17 DIAGNOSIS — K219 Gastro-esophageal reflux disease without esophagitis: Secondary | ICD-10-CM | POA: Diagnosis not present

## 2015-08-17 DIAGNOSIS — S72142D Displaced intertrochanteric fracture of left femur, subsequent encounter for closed fracture with routine healing: Secondary | ICD-10-CM | POA: Diagnosis not present

## 2015-08-17 DIAGNOSIS — F0390 Unspecified dementia without behavioral disturbance: Secondary | ICD-10-CM | POA: Diagnosis not present

## 2015-08-17 DIAGNOSIS — K5900 Constipation, unspecified: Secondary | ICD-10-CM | POA: Diagnosis not present

## 2015-08-17 DIAGNOSIS — Z79891 Long term (current) use of opiate analgesic: Secondary | ICD-10-CM | POA: Diagnosis not present

## 2015-08-17 DIAGNOSIS — S72145D Nondisplaced intertrochanteric fracture of left femur, subsequent encounter for closed fracture with routine healing: Secondary | ICD-10-CM | POA: Diagnosis not present

## 2015-08-17 DIAGNOSIS — S52372D Galeazzi's fracture of left radius, subsequent encounter for closed fracture with routine healing: Secondary | ICD-10-CM | POA: Diagnosis not present

## 2015-08-17 DIAGNOSIS — J9691 Respiratory failure, unspecified with hypoxia: Secondary | ICD-10-CM | POA: Diagnosis not present

## 2015-08-21 DIAGNOSIS — F0390 Unspecified dementia without behavioral disturbance: Secondary | ICD-10-CM | POA: Diagnosis not present

## 2015-08-21 DIAGNOSIS — J188 Other pneumonia, unspecified organism: Secondary | ICD-10-CM | POA: Diagnosis not present

## 2015-08-21 DIAGNOSIS — I10 Essential (primary) hypertension: Secondary | ICD-10-CM | POA: Diagnosis not present

## 2015-08-21 DIAGNOSIS — D62 Acute posthemorrhagic anemia: Secondary | ICD-10-CM | POA: Diagnosis not present

## 2015-08-21 DIAGNOSIS — E890 Postprocedural hypothyroidism: Secondary | ICD-10-CM | POA: Diagnosis not present

## 2015-08-21 DIAGNOSIS — K219 Gastro-esophageal reflux disease without esophagitis: Secondary | ICD-10-CM | POA: Diagnosis not present

## 2015-08-21 DIAGNOSIS — K5900 Constipation, unspecified: Secondary | ICD-10-CM | POA: Diagnosis not present

## 2015-08-21 DIAGNOSIS — Z4789 Encounter for other orthopedic aftercare: Secondary | ICD-10-CM | POA: Diagnosis not present

## 2015-08-21 DIAGNOSIS — S72145D Nondisplaced intertrochanteric fracture of left femur, subsequent encounter for closed fracture with routine healing: Secondary | ICD-10-CM | POA: Diagnosis not present

## 2015-08-21 DIAGNOSIS — Z9181 History of falling: Secondary | ICD-10-CM | POA: Diagnosis not present

## 2015-08-21 DIAGNOSIS — J9691 Respiratory failure, unspecified with hypoxia: Secondary | ICD-10-CM | POA: Diagnosis not present

## 2015-08-24 DIAGNOSIS — S72142D Displaced intertrochanteric fracture of left femur, subsequent encounter for closed fracture with routine healing: Secondary | ICD-10-CM | POA: Diagnosis not present

## 2015-08-24 DIAGNOSIS — Z9181 History of falling: Secondary | ICD-10-CM | POA: Diagnosis not present

## 2015-08-24 DIAGNOSIS — S52372D Galeazzi's fracture of left radius, subsequent encounter for closed fracture with routine healing: Secondary | ICD-10-CM | POA: Diagnosis not present

## 2015-08-24 DIAGNOSIS — Z79891 Long term (current) use of opiate analgesic: Secondary | ICD-10-CM | POA: Diagnosis not present

## 2015-08-24 DIAGNOSIS — I10 Essential (primary) hypertension: Secondary | ICD-10-CM | POA: Diagnosis not present

## 2015-08-24 DIAGNOSIS — W19XXXD Unspecified fall, subsequent encounter: Secondary | ICD-10-CM | POA: Diagnosis not present

## 2015-08-24 DIAGNOSIS — Z96653 Presence of artificial knee joint, bilateral: Secondary | ICD-10-CM | POA: Diagnosis not present

## 2015-08-24 DIAGNOSIS — F0390 Unspecified dementia without behavioral disturbance: Secondary | ICD-10-CM | POA: Diagnosis not present

## 2015-08-24 DIAGNOSIS — Z7902 Long term (current) use of antithrombotics/antiplatelets: Secondary | ICD-10-CM | POA: Diagnosis not present

## 2015-08-24 DIAGNOSIS — Z792 Long term (current) use of antibiotics: Secondary | ICD-10-CM | POA: Diagnosis not present

## 2015-08-24 DIAGNOSIS — M9702XD Periprosthetic fracture around internal prosthetic left hip joint, subsequent encounter: Secondary | ICD-10-CM | POA: Diagnosis not present

## 2015-08-24 DIAGNOSIS — I69352 Hemiplegia and hemiparesis following cerebral infarction affecting left dominant side: Secondary | ICD-10-CM | POA: Diagnosis not present

## 2015-08-27 DIAGNOSIS — S72442D Displaced fracture of lower epiphysis (separation) of left femur, subsequent encounter for closed fracture with routine healing: Secondary | ICD-10-CM | POA: Diagnosis not present

## 2015-08-28 ENCOUNTER — Telehealth: Payer: Self-pay | Admitting: Family Medicine

## 2015-08-28 MED ORDER — DOXYCYCLINE HYCLATE 100 MG PO CAPS: 100.0000 mg | ORAL_CAPSULE | Freq: Two times a day (BID) | ORAL | Status: DC

## 2015-08-28 NOTE — Telephone Encounter (Signed)
Given the difficulty of getting patient in, reasonable to treat.  Doxycycline sent.   If SOB, wheeze, or overall worsening, then he'll need to be seen.   Please give wife these specific instructions re: eval over the weekend.   I would like update on patient in a few days, even if doing better.   Thanks.

## 2015-08-28 NOTE — Telephone Encounter (Signed)
Spoke to pt's wife. She will let us know how well he is on Tuesday.

## 2015-08-28 NOTE — Telephone Encounter (Signed)
Pt has been coughing up "green stuff" since yesterday.  It has gone "from clear to yellow to green".  Wife was wondering if PCP would call in an antibiotic to help.  He also is running low grade fever at 99.6.  Pharmacy of choice is [facility].  Spouse declined an appointment due to immobility of pt.  Please advise.

## 2015-09-01 DIAGNOSIS — Z7902 Long term (current) use of antithrombotics/antiplatelets: Secondary | ICD-10-CM | POA: Diagnosis not present

## 2015-09-01 DIAGNOSIS — I10 Essential (primary) hypertension: Secondary | ICD-10-CM | POA: Diagnosis not present

## 2015-09-01 DIAGNOSIS — S72142D Displaced intertrochanteric fracture of left femur, subsequent encounter for closed fracture with routine healing: Secondary | ICD-10-CM | POA: Diagnosis not present

## 2015-09-01 DIAGNOSIS — M9702XD Periprosthetic fracture around internal prosthetic left hip joint, subsequent encounter: Secondary | ICD-10-CM | POA: Diagnosis not present

## 2015-09-01 DIAGNOSIS — I69352 Hemiplegia and hemiparesis following cerebral infarction affecting left dominant side: Secondary | ICD-10-CM | POA: Diagnosis not present

## 2015-09-01 DIAGNOSIS — Z96653 Presence of artificial knee joint, bilateral: Secondary | ICD-10-CM | POA: Diagnosis not present

## 2015-09-01 DIAGNOSIS — Z792 Long term (current) use of antibiotics: Secondary | ICD-10-CM | POA: Diagnosis not present

## 2015-09-01 DIAGNOSIS — Z9181 History of falling: Secondary | ICD-10-CM | POA: Diagnosis not present

## 2015-09-01 DIAGNOSIS — F0390 Unspecified dementia without behavioral disturbance: Secondary | ICD-10-CM | POA: Diagnosis not present

## 2015-09-01 DIAGNOSIS — S52372D Galeazzi's fracture of left radius, subsequent encounter for closed fracture with routine healing: Secondary | ICD-10-CM | POA: Diagnosis not present

## 2015-09-01 DIAGNOSIS — W19XXXD Unspecified fall, subsequent encounter: Secondary | ICD-10-CM | POA: Diagnosis not present

## 2015-09-01 DIAGNOSIS — Z79891 Long term (current) use of opiate analgesic: Secondary | ICD-10-CM | POA: Diagnosis not present

## 2015-09-01 NOTE — Telephone Encounter (Signed)
Noted. Glad he is some better.  Should gradually improve.  Update [place] as needed.  Thanks.

## 2015-09-01 NOTE — Telephone Encounter (Signed)
[name] called with update; pt is feeling better; productive cough with clear to yellow phlegm is better; pt continuing abx. No SOB or wheezing. Pt just feeling weak. FYI to [name].

## 2015-09-07 ENCOUNTER — Telehealth: Payer: Self-pay | Admitting: Family Medicine

## 2015-09-07 NOTE — Telephone Encounter (Signed)
[place]  Patient Name: KYNDALL Hevia  DOB: Apr 24, 1930    Initial Comment Callers husband just finished his antibiotics last night. Late Saturday night he spit up some blood. Wanting to know if he needs to be seen.    Nurse Assessment  Nurse: Wayne Sever, RN, Tillie Rung Date/Time (Eastern Time): 09/07/2015 9:48:22 AM  Confirm and document reason for call. If symptomatic, describe symptoms. You must click the next button to save text entered. ---Wife states he has some blood in his phlegm when he was coughing on Saturday night. She states it is mixed in his sputum. He just finished antibiotics for lung infection last night per wife. Denies any fever.  Has the patient traveled out of the country within the last 30 days? ---Not Applicable  Does the patient have any new or worsening symptoms? ---Yes  Will a triage be completed? ---Yes  Related visit to physician within the last 2 weeks? ---Yes  Does the PT have any chronic conditions? (i.e. diabetes, asthma, etc.) ---Yes  List chronic conditions. ---Bed Ridden(broke his hip and leg), Plavix, HTN  Is this a behavioral health or substance abuse call? ---No     Guidelines    Guideline Title Affirmed Question Affirmed Notes  Coughing Up Blood Bedridden (e.g., nursing home patient, CVA, chronic illness, recovering from surgery)    Final Disposition User   Go to ED Now (or PCP triage) Wayne Sever, RN, Tillie Rung    Comments  No appointments left in the office. Caller was hesitant to seek treatment for her husband. Told her the importance of him getting seen due to coughing up blood and being bed ridden   Referrals  GO TO FACILITY UNDECIDED   Disagree/Comply: Comply

## 2015-09-07 NOTE — Telephone Encounter (Signed)
Noted. Thanks.

## 2015-09-07 NOTE — Telephone Encounter (Signed)
Mrs Gerard said no bleeding since 09/05/15 except the blood tinged mucus when coughed x 1 on 09/05/15.Mrs Heiden did not want to take pt to ED. Mrs Cahall scheduled appt with [name] on 09/08/15 but if condition changes or worsens prior to appt pt will go to ED.

## 2015-09-08 ENCOUNTER — Ambulatory Visit (INDEPENDENT_AMBULATORY_CARE_PROVIDER_SITE_OTHER): Payer: PPO | Admitting: Family Medicine

## 2015-09-08 ENCOUNTER — Encounter: Payer: Self-pay | Admitting: Family Medicine

## 2015-09-08 VITALS — BP 102/52 | HR 78 | Temp 97.7°F

## 2015-09-08 DIAGNOSIS — R05 Cough: Secondary | ICD-10-CM | POA: Diagnosis not present

## 2015-09-08 DIAGNOSIS — D649 Anemia, unspecified: Secondary | ICD-10-CM | POA: Diagnosis not present

## 2015-09-08 DIAGNOSIS — I10 Essential (primary) hypertension: Secondary | ICD-10-CM

## 2015-09-08 DIAGNOSIS — R059 Cough, unspecified: Secondary | ICD-10-CM

## 2015-09-08 DIAGNOSIS — F0390 Unspecified dementia without behavioral disturbance: Secondary | ICD-10-CM

## 2015-09-08 LAB — CBC WITH DIFFERENTIAL/PLATELET
BASOS PCT: 0.4 % (ref 0.0–3.0)
Basophils Absolute: 0 10*3/uL (ref 0.0–0.1)
EOS ABS: 0.2 10*3/uL (ref 0.0–0.7)
EOS PCT: 1.7 % (ref 0.0–5.0)
HEMATOCRIT: 39.5 % (ref 39.0–52.0)
Hemoglobin: 13.1 g/dL (ref 13.0–17.0)
LYMPHS PCT: 12.3 % (ref 12.0–46.0)
Lymphs Abs: 1.3 10*3/uL (ref 0.7–4.0)
MCHC: 33.1 g/dL (ref 30.0–36.0)
MCV: 88.5 fl (ref 78.0–100.0)
MONOS PCT: 5.7 % (ref 3.0–12.0)
Monocytes Absolute: 0.6 10*3/uL (ref 0.1–1.0)
Neutro Abs: 8.2 10*3/uL — ABNORMAL HIGH (ref 1.4–7.7)
Neutrophils Relative %: 79.9 % — ABNORMAL HIGH (ref 43.0–77.0)
PLATELETS: 368 10*3/uL (ref 150.0–400.0)
RBC: 4.47 Mil/uL (ref 4.22–5.81)
RDW: 13.7 % (ref 11.5–15.5)
WBC: 10.2 10*3/uL (ref 4.0–10.5)

## 2015-09-08 LAB — IBC PANEL
Iron: 24 ug/dL — ABNORMAL LOW (ref 42–165)
Saturation Ratios: 9.9 % — ABNORMAL LOW (ref 20.0–50.0)
TRANSFERRIN: 174 mg/dL — AB (ref 212.0–360.0)

## 2015-09-08 LAB — COMPREHENSIVE METABOLIC PANEL
ALBUMIN: 3.7 g/dL (ref 3.5–5.2)
ALK PHOS: 88 U/L (ref 39–117)
ALT: 10 U/L (ref 0–53)
AST: 14 U/L (ref 0–37)
BUN: 24 mg/dL — ABNORMAL HIGH (ref 6–23)
CHLORIDE: 103 meq/L (ref 96–112)
CO2: 30 mEq/L (ref 19–32)
Calcium: 9.4 mg/dL (ref 8.4–10.5)
Creatinine, Ser: 0.83 mg/dL (ref 0.40–1.50)
GFR: 94.09 mL/min (ref >60.00)
Glucose, Bld: 92 mg/dL (ref 70–99)
POTASSIUM: 3.4 meq/L — AB (ref 3.5–5.1)
Sodium: 140 mEq/L (ref 135–145)
TOTAL PROTEIN: 7 g/dL (ref 6.0–8.3)
Total Bilirubin: 0.5 mg/dL (ref 0.2–1.2)

## 2015-09-08 LAB — TSH: TSH: 2.29 u[IU]/mL (ref 0.35–4.50)

## 2015-09-08 MED ORDER — AMLODIPINE BESYLATE 10 MG PO TABS: 5.0000 mg | ORAL_TABLET | Freq: Every day | ORAL | Status: DC

## 2015-09-08 NOTE — Patient Instructions (Signed)
We'll contact you with your lab report. Cut the amlodipine back to 5mg  a day.  If still lightheaded, then stop the med.  Take care.  Glad to see you.  Update me as needed.

## 2015-09-08 NOTE — Progress Notes (Signed)
Pre visit review using our clinic review tool, if applicable. No additional management support is needed unless otherwise documented below in the visit note.  HTN. Had been getting lightheaded.  Still on amlodipine and terazosin.  Worse when getting up/sitting up.  Due for labs.    Recently with cough.  Bloody sputum once over the weekend.  No more bloody sputum in the meantime.  No other bleeding.  Not SOB.  Still coughing some.  No fevers in the last week.  Mainly bedbound now, unless with sig assistance to get up and to wheelchair.  Done with doxycycline as of 2 days ago.  Still with some sputum (at baseline is clear, back to clear now).    Dementia.  Needs round the clock care.  No acute changes o/w.  Still on baseline meds. Wife is looking into long term care help at home.    PMH and SH reviewed  ROS: Per HPI unless specifically indicated in ROS section   Meds, vitals, and allergies reviewed.   Nad Elderly male in nad Mmm Neck supple, no LA rrr ctab Abd soft, normal BS Ext w/o edema L hip externally rotated when patient is supine, this is baseline for patient.    Nasal and OP exam w/o source of bleeding seen.

## 2015-09-09 ENCOUNTER — Encounter: Payer: Self-pay | Admitting: Family Medicine

## 2015-09-09 DIAGNOSIS — R059 Cough, unspecified: Secondary | ICD-10-CM | POA: Insufficient documentation

## 2015-09-09 DIAGNOSIS — R05 Cough: Secondary | ICD-10-CM | POA: Insufficient documentation

## 2015-09-09 NOTE — Assessment & Plan Note (Signed)
It is likely not reasonable to have patient standing alone for cxr.  With no more bloody sputum, and only 1 episode, then I would defer tx for now and observe only.  Wife agrees.  Appears to be back to baseline.

## 2015-09-09 NOTE — Assessment & Plan Note (Signed)
Would continue current meds and wife is checking on extra home care to help both of them.

## 2015-09-09 NOTE — Assessment & Plan Note (Addendum)
Will cut the amlodipine back to 5mg  a day. If still lightheaded, then stop the med.  Wife agrees.  See notes on labs.

## 2015-09-17 DIAGNOSIS — Z9181 History of falling: Secondary | ICD-10-CM | POA: Diagnosis not present

## 2015-09-17 DIAGNOSIS — K5900 Constipation, unspecified: Secondary | ICD-10-CM | POA: Diagnosis not present

## 2015-09-17 DIAGNOSIS — D62 Acute posthemorrhagic anemia: Secondary | ICD-10-CM | POA: Diagnosis not present

## 2015-09-17 DIAGNOSIS — I10 Essential (primary) hypertension: Secondary | ICD-10-CM | POA: Diagnosis not present

## 2015-09-17 DIAGNOSIS — J9691 Respiratory failure, unspecified with hypoxia: Secondary | ICD-10-CM | POA: Diagnosis not present

## 2015-09-17 DIAGNOSIS — R269 Unspecified abnormalities of gait and mobility: Secondary | ICD-10-CM | POA: Diagnosis not present

## 2015-09-17 DIAGNOSIS — S72145D Nondisplaced intertrochanteric fracture of left femur, subsequent encounter for closed fracture with routine healing: Secondary | ICD-10-CM | POA: Diagnosis not present

## 2015-09-17 DIAGNOSIS — K219 Gastro-esophageal reflux disease without esophagitis: Secondary | ICD-10-CM | POA: Diagnosis not present

## 2015-09-17 DIAGNOSIS — Z4789 Encounter for other orthopedic aftercare: Secondary | ICD-10-CM | POA: Diagnosis not present

## 2015-09-17 DIAGNOSIS — E890 Postprocedural hypothyroidism: Secondary | ICD-10-CM | POA: Diagnosis not present

## 2015-09-17 DIAGNOSIS — F0390 Unspecified dementia without behavioral disturbance: Secondary | ICD-10-CM | POA: Diagnosis not present

## 2015-09-17 DIAGNOSIS — J188 Other pneumonia, unspecified organism: Secondary | ICD-10-CM | POA: Diagnosis not present

## 2015-09-21 DIAGNOSIS — E890 Postprocedural hypothyroidism: Secondary | ICD-10-CM | POA: Diagnosis not present

## 2015-09-21 DIAGNOSIS — Z9181 History of falling: Secondary | ICD-10-CM | POA: Diagnosis not present

## 2015-09-21 DIAGNOSIS — K5900 Constipation, unspecified: Secondary | ICD-10-CM | POA: Diagnosis not present

## 2015-09-21 DIAGNOSIS — Z4789 Encounter for other orthopedic aftercare: Secondary | ICD-10-CM | POA: Diagnosis not present

## 2015-09-21 DIAGNOSIS — J188 Other pneumonia, unspecified organism: Secondary | ICD-10-CM | POA: Diagnosis not present

## 2015-09-21 DIAGNOSIS — D62 Acute posthemorrhagic anemia: Secondary | ICD-10-CM | POA: Diagnosis not present

## 2015-09-21 DIAGNOSIS — J9691 Respiratory failure, unspecified with hypoxia: Secondary | ICD-10-CM | POA: Diagnosis not present

## 2015-09-21 DIAGNOSIS — K219 Gastro-esophageal reflux disease without esophagitis: Secondary | ICD-10-CM | POA: Diagnosis not present

## 2015-09-21 DIAGNOSIS — S72145D Nondisplaced intertrochanteric fracture of left femur, subsequent encounter for closed fracture with routine healing: Secondary | ICD-10-CM | POA: Diagnosis not present

## 2015-09-21 DIAGNOSIS — I10 Essential (primary) hypertension: Secondary | ICD-10-CM | POA: Diagnosis not present

## 2015-09-21 DIAGNOSIS — F0390 Unspecified dementia without behavioral disturbance: Secondary | ICD-10-CM | POA: Diagnosis not present

## 2015-10-12 ENCOUNTER — Other Ambulatory Visit: Payer: Self-pay

## 2015-10-12 MED ORDER — CLOPIDOGREL BISULFATE 75 MG PO TABS: 75.0000 mg | ORAL_TABLET | Freq: Every day | ORAL | Status: DC

## 2015-10-12 NOTE — Telephone Encounter (Signed)
[name] DPR signed left v/m requesting [facility]ill [facility]avix to [facility].[name] notified refill done as requested.

## 2015-10-17 DIAGNOSIS — S72145D Nondisplaced intertrochanteric fracture of left femur, subsequent encounter for closed fracture with routine healing: Secondary | ICD-10-CM | POA: Diagnosis not present

## 2015-10-17 DIAGNOSIS — D62 Acute posthemorrhagic anemia: Secondary | ICD-10-CM | POA: Diagnosis not present

## 2015-10-17 DIAGNOSIS — Z4789 Encounter for other orthopedic aftercare: Secondary | ICD-10-CM | POA: Diagnosis not present

## 2015-10-17 DIAGNOSIS — J188 Other pneumonia, unspecified organism: Secondary | ICD-10-CM | POA: Diagnosis not present

## 2015-10-17 DIAGNOSIS — Z9181 History of falling: Secondary | ICD-10-CM | POA: Diagnosis not present

## 2015-10-17 DIAGNOSIS — K219 Gastro-esophageal reflux disease without esophagitis: Secondary | ICD-10-CM | POA: Diagnosis not present

## 2015-10-17 DIAGNOSIS — K5900 Constipation, unspecified: Secondary | ICD-10-CM | POA: Diagnosis not present

## 2015-10-17 DIAGNOSIS — F0390 Unspecified dementia without behavioral disturbance: Secondary | ICD-10-CM | POA: Diagnosis not present

## 2015-10-17 DIAGNOSIS — E890 Postprocedural hypothyroidism: Secondary | ICD-10-CM | POA: Diagnosis not present

## 2015-10-17 DIAGNOSIS — R269 Unspecified abnormalities of gait and mobility: Secondary | ICD-10-CM | POA: Diagnosis not present

## 2015-10-17 DIAGNOSIS — J9691 Respiratory failure, unspecified with hypoxia: Secondary | ICD-10-CM | POA: Diagnosis not present

## 2015-10-17 DIAGNOSIS — I10 Essential (primary) hypertension: Secondary | ICD-10-CM | POA: Diagnosis not present

## 2015-10-21 DIAGNOSIS — Z4789 Encounter for other orthopedic aftercare: Secondary | ICD-10-CM | POA: Diagnosis not present

## 2015-10-21 DIAGNOSIS — Z9181 History of falling: Secondary | ICD-10-CM | POA: Diagnosis not present

## 2015-10-21 DIAGNOSIS — J9691 Respiratory failure, unspecified with hypoxia: Secondary | ICD-10-CM | POA: Diagnosis not present

## 2015-10-21 DIAGNOSIS — D62 Acute posthemorrhagic anemia: Secondary | ICD-10-CM | POA: Diagnosis not present

## 2015-10-21 DIAGNOSIS — K5900 Constipation, unspecified: Secondary | ICD-10-CM | POA: Diagnosis not present

## 2015-10-21 DIAGNOSIS — K219 Gastro-esophageal reflux disease without esophagitis: Secondary | ICD-10-CM | POA: Diagnosis not present

## 2015-10-21 DIAGNOSIS — E890 Postprocedural hypothyroidism: Secondary | ICD-10-CM | POA: Diagnosis not present

## 2015-10-21 DIAGNOSIS — I10 Essential (primary) hypertension: Secondary | ICD-10-CM | POA: Diagnosis not present

## 2015-10-21 DIAGNOSIS — F0390 Unspecified dementia without behavioral disturbance: Secondary | ICD-10-CM | POA: Diagnosis not present

## 2015-10-21 DIAGNOSIS — J188 Other pneumonia, unspecified organism: Secondary | ICD-10-CM | POA: Diagnosis not present

## 2015-10-21 DIAGNOSIS — S72145D Nondisplaced intertrochanteric fracture of left femur, subsequent encounter for closed fracture with routine healing: Secondary | ICD-10-CM | POA: Diagnosis not present

## 2015-11-17 DIAGNOSIS — F0390 Unspecified dementia without behavioral disturbance: Secondary | ICD-10-CM | POA: Diagnosis not present

## 2015-11-17 DIAGNOSIS — K5900 Constipation, unspecified: Secondary | ICD-10-CM | POA: Diagnosis not present

## 2015-11-17 DIAGNOSIS — Z4789 Encounter for other orthopedic aftercare: Secondary | ICD-10-CM | POA: Diagnosis not present

## 2015-11-17 DIAGNOSIS — D62 Acute posthemorrhagic anemia: Secondary | ICD-10-CM | POA: Diagnosis not present

## 2015-11-17 DIAGNOSIS — R269 Unspecified abnormalities of gait and mobility: Secondary | ICD-10-CM | POA: Diagnosis not present

## 2015-11-17 DIAGNOSIS — E890 Postprocedural hypothyroidism: Secondary | ICD-10-CM | POA: Diagnosis not present

## 2015-11-17 DIAGNOSIS — J188 Other pneumonia, unspecified organism: Secondary | ICD-10-CM | POA: Diagnosis not present

## 2015-11-17 DIAGNOSIS — K219 Gastro-esophageal reflux disease without esophagitis: Secondary | ICD-10-CM | POA: Diagnosis not present

## 2015-11-17 DIAGNOSIS — I10 Essential (primary) hypertension: Secondary | ICD-10-CM | POA: Diagnosis not present

## 2015-11-17 DIAGNOSIS — J9691 Respiratory failure, unspecified with hypoxia: Secondary | ICD-10-CM | POA: Diagnosis not present

## 2015-11-17 DIAGNOSIS — Z9181 History of falling: Secondary | ICD-10-CM | POA: Diagnosis not present

## 2015-11-17 DIAGNOSIS — S72145D Nondisplaced intertrochanteric fracture of left femur, subsequent encounter for closed fracture with routine healing: Secondary | ICD-10-CM | POA: Diagnosis not present

## 2015-11-21 DIAGNOSIS — E890 Postprocedural hypothyroidism: Secondary | ICD-10-CM | POA: Diagnosis not present

## 2015-11-21 DIAGNOSIS — Z9181 History of falling: Secondary | ICD-10-CM | POA: Diagnosis not present

## 2015-11-21 DIAGNOSIS — J9691 Respiratory failure, unspecified with hypoxia: Secondary | ICD-10-CM | POA: Diagnosis not present

## 2015-11-21 DIAGNOSIS — I10 Essential (primary) hypertension: Secondary | ICD-10-CM | POA: Diagnosis not present

## 2015-11-21 DIAGNOSIS — S72145D Nondisplaced intertrochanteric fracture of left femur, subsequent encounter for closed fracture with routine healing: Secondary | ICD-10-CM | POA: Diagnosis not present

## 2015-11-21 DIAGNOSIS — K5900 Constipation, unspecified: Secondary | ICD-10-CM | POA: Diagnosis not present

## 2015-11-21 DIAGNOSIS — Z4789 Encounter for other orthopedic aftercare: Secondary | ICD-10-CM | POA: Diagnosis not present

## 2015-11-21 DIAGNOSIS — D62 Acute posthemorrhagic anemia: Secondary | ICD-10-CM | POA: Diagnosis not present

## 2015-11-21 DIAGNOSIS — F0390 Unspecified dementia without behavioral disturbance: Secondary | ICD-10-CM | POA: Diagnosis not present

## 2015-11-21 DIAGNOSIS — K219 Gastro-esophageal reflux disease without esophagitis: Secondary | ICD-10-CM | POA: Diagnosis not present

## 2015-11-21 DIAGNOSIS — J188 Other pneumonia, unspecified organism: Secondary | ICD-10-CM | POA: Diagnosis not present

## 2015-11-23 ENCOUNTER — Ambulatory Visit: Payer: PPO | Admitting: Neurology

## 2015-11-24 ENCOUNTER — Encounter: Payer: Self-pay | Admitting: Neurology

## 2015-11-30 ENCOUNTER — Other Ambulatory Visit: Payer: Self-pay

## 2015-11-30 MED ORDER — TERAZOSIN HCL 5 MG PO CAPS: 5.0000 mg | ORAL_CAPSULE | Freq: Every day | ORAL | 1 refills | Status: DC

## 2015-11-30 NOTE — Telephone Encounter (Signed)
[name] request refill terazosin to [facility]; pt last seen 09/08/15. Refill done per protocol. [name] voiced understanding.

## 2015-12-02 NOTE — Telephone Encounter (Signed)
This encounter was created in error - please disregard.

## 2015-12-14 ENCOUNTER — Ambulatory Visit (INDEPENDENT_AMBULATORY_CARE_PROVIDER_SITE_OTHER): Payer: PPO | Admitting: Family Medicine

## 2015-12-14 ENCOUNTER — Emergency Department (HOSPITAL_COMMUNITY): Payer: PPO

## 2015-12-14 ENCOUNTER — Encounter (HOSPITAL_COMMUNITY): Payer: Self-pay | Admitting: Emergency Medicine

## 2015-12-14 ENCOUNTER — Encounter: Payer: Self-pay | Admitting: Family Medicine

## 2015-12-14 ENCOUNTER — Inpatient Hospital Stay (HOSPITAL_COMMUNITY)
Admission: EM | Admit: 2015-12-14 | Discharge: 2015-12-20 | DRG: 871 | Disposition: A | Discharge status: Home / Self Care | Payer: PPO | Attending: Internal Medicine | Admitting: Internal Medicine

## 2015-12-14 ENCOUNTER — Inpatient Hospital Stay (HOSPITAL_COMMUNITY): Payer: PPO

## 2015-12-14 DIAGNOSIS — E872 Acidosis, unspecified: Secondary | ICD-10-CM

## 2015-12-14 DIAGNOSIS — E039 Hypothyroidism, unspecified: Secondary | ICD-10-CM | POA: Diagnosis not present

## 2015-12-14 DIAGNOSIS — N400 Enlarged prostate without lower urinary tract symptoms: Secondary | ICD-10-CM | POA: Diagnosis present

## 2015-12-14 DIAGNOSIS — Z66 Do not resuscitate: Secondary | ICD-10-CM | POA: Diagnosis present

## 2015-12-14 DIAGNOSIS — N390 Urinary tract infection, site not specified: Secondary | ICD-10-CM

## 2015-12-14 DIAGNOSIS — R7881 Bacteremia: Secondary | ICD-10-CM | POA: Diagnosis not present

## 2015-12-14 DIAGNOSIS — J9819 Other pulmonary collapse: Secondary | ICD-10-CM | POA: Diagnosis present

## 2015-12-14 DIAGNOSIS — J189 Pneumonia, unspecified organism: Secondary | ICD-10-CM | POA: Diagnosis not present

## 2015-12-14 DIAGNOSIS — G3183 Dementia with Lewy bodies: Secondary | ICD-10-CM | POA: Diagnosis not present

## 2015-12-14 DIAGNOSIS — I110 Hypertensive heart disease with heart failure: Secondary | ICD-10-CM | POA: Diagnosis present

## 2015-12-14 DIAGNOSIS — Z7401 Bed confinement status: Secondary | ICD-10-CM | POA: Diagnosis not present

## 2015-12-14 DIAGNOSIS — F329 Major depressive disorder, single episode, unspecified: Secondary | ICD-10-CM | POA: Diagnosis present

## 2015-12-14 DIAGNOSIS — K5790 Diverticulosis of intestine, part unspecified, without perforation or abscess without bleeding: Secondary | ICD-10-CM | POA: Diagnosis not present

## 2015-12-14 DIAGNOSIS — R6520 Severe sepsis without septic shock: Secondary | ICD-10-CM | POA: Diagnosis present

## 2015-12-14 DIAGNOSIS — F0390 Unspecified dementia without behavioral disturbance: Secondary | ICD-10-CM | POA: Diagnosis not present

## 2015-12-14 DIAGNOSIS — I5032 Chronic diastolic (congestive) heart failure: Secondary | ICD-10-CM | POA: Diagnosis present

## 2015-12-14 DIAGNOSIS — J9811 Atelectasis: Secondary | ICD-10-CM | POA: Diagnosis not present

## 2015-12-14 DIAGNOSIS — C649 Malignant neoplasm of unspecified kidney, except renal pelvis: Secondary | ICD-10-CM | POA: Diagnosis present

## 2015-12-14 DIAGNOSIS — Z96653 Presence of artificial knee joint, bilateral: Secondary | ICD-10-CM | POA: Diagnosis not present

## 2015-12-14 DIAGNOSIS — N179 Acute kidney failure, unspecified: Secondary | ICD-10-CM | POA: Diagnosis not present

## 2015-12-14 DIAGNOSIS — B965 Pseudomonas (aeruginosa) (mallei) (pseudomallei) as the cause of diseases classified elsewhere: Secondary | ICD-10-CM

## 2015-12-14 DIAGNOSIS — B964 Proteus (mirabilis) (morganii) as the cause of diseases classified elsewhere: Secondary | ICD-10-CM | POA: Diagnosis present

## 2015-12-14 DIAGNOSIS — A419 Sepsis, unspecified organism: Secondary | ICD-10-CM | POA: Diagnosis not present

## 2015-12-14 DIAGNOSIS — Z23 Encounter for immunization: Secondary | ICD-10-CM

## 2015-12-14 DIAGNOSIS — Z7902 Long term (current) use of antithrombotics/antiplatelets: Secondary | ICD-10-CM | POA: Diagnosis not present

## 2015-12-14 DIAGNOSIS — F0280 Dementia in other diseases classified elsewhere without behavioral disturbance: Secondary | ICD-10-CM | POA: Diagnosis not present

## 2015-12-14 DIAGNOSIS — S72142A Displaced intertrochanteric fracture of left femur, initial encounter for closed fracture: Secondary | ICD-10-CM | POA: Diagnosis present

## 2015-12-14 DIAGNOSIS — A4159 Other Gram-negative sepsis: Principal | ICD-10-CM | POA: Diagnosis present

## 2015-12-14 DIAGNOSIS — R300 Dysuria: Secondary | ICD-10-CM | POA: Diagnosis not present

## 2015-12-14 DIAGNOSIS — I959 Hypotension, unspecified: Secondary | ICD-10-CM | POA: Diagnosis not present

## 2015-12-14 DIAGNOSIS — E876 Hypokalemia: Secondary | ICD-10-CM | POA: Diagnosis not present

## 2015-12-14 DIAGNOSIS — S72142D Displaced intertrochanteric fracture of left femur, subsequent encounter for closed fracture with routine healing: Secondary | ICD-10-CM | POA: Diagnosis not present

## 2015-12-14 LAB — CBC WITH DIFFERENTIAL/PLATELET
BASOS PCT: 0 %
Basophils Absolute: 0 10*3/uL (ref 0.0–0.1)
EOS PCT: 0 %
Eosinophils Absolute: 0 10*3/uL (ref 0.0–0.7)
HEMATOCRIT: 40.6 % (ref 39.0–52.0)
HEMOGLOBIN: 13.2 g/dL (ref 13.0–17.0)
LYMPHS PCT: 2 %
Lymphs Abs: 0.8 10*3/uL (ref 0.7–4.0)
MCH: 28.9 pg (ref 26.0–34.0)
MCHC: 32.5 g/dL (ref 30.0–36.0)
MCV: 89 fL (ref 78.0–100.0)
Monocytes Absolute: 2.3 10*3/uL — ABNORMAL HIGH (ref 0.1–1.0)
Monocytes Relative: 6 %
NEUTROS PCT: 92 %
Neutro Abs: 34.4 10*3/uL — ABNORMAL HIGH (ref 1.7–7.7)
PLATELETS: 248 10*3/uL (ref 150–400)
RBC: 4.56 MIL/uL (ref 4.22–5.81)
RDW: 14 % (ref 11.5–15.5)
WBC MORPHOLOGY: INCREASED
WBC: 37.5 10*3/uL — ABNORMAL HIGH (ref 4.0–10.5)

## 2015-12-14 LAB — I-STAT VENOUS BLOOD GAS, ED
Acid-base deficit: 11 mmol/L — ABNORMAL HIGH (ref 0.0–2.0)
BICARBONATE: 15.2 mmol/L — AB (ref 20.0–28.0)
O2 SAT: 64 %
PCO2 VEN: 33.4 mmHg — AB (ref 44.0–60.0)
PO2 VEN: 38 mmHg (ref 32.0–45.0)
TCO2: 16 mmol/L (ref 0–100)
pH, Ven: 7.267 (ref 7.250–7.430)

## 2015-12-14 LAB — COMPREHENSIVE METABOLIC PANEL
ALBUMIN: 3.4 g/dL — AB (ref 3.5–5.0)
ALT: 21 U/L (ref 17–63)
AST: 38 U/L (ref 15–41)
Alkaline Phosphatase: 79 U/L (ref 38–126)
Anion gap: 10 (ref 5–15)
BUN: 29 mg/dL — ABNORMAL HIGH (ref 6–20)
CHLORIDE: 106 mmol/L (ref 101–111)
CO2: 23 mmol/L (ref 22–32)
CREATININE: 1.42 mg/dL — AB (ref 0.61–1.24)
Calcium: 9 mg/dL (ref 8.9–10.3)
GFR calc non Af Amer: 44 mL/min — ABNORMAL LOW (ref >60)
GFR, EST AFRICAN AMERICAN: 51 mL/min — AB (ref >60)
GLUCOSE: 178 mg/dL — AB (ref 65–99)
Potassium: 3.2 mmol/L — ABNORMAL LOW (ref 3.5–5.1)
SODIUM: 139 mmol/L (ref 135–145)
Total Bilirubin: 0.5 mg/dL (ref 0.3–1.2)
Total Protein: 6.5 g/dL (ref 6.5–8.1)

## 2015-12-14 LAB — URINALYSIS, ROUTINE W REFLEX MICROSCOPIC
Glucose, UA: NEGATIVE mg/dL
KETONES UR: 15 mg/dL — AB
NITRITE: POSITIVE — AB
Specific Gravity, Urine: 1.022 (ref 1.005–1.030)
pH: 8 (ref 5.0–8.0)

## 2015-12-14 LAB — I-STAT TROPONIN, ED: TROPONIN I, POC: 0.01 ng/mL (ref 0.00–0.08)

## 2015-12-14 LAB — I-STAT CG4 LACTIC ACID, ED
Lactic Acid, Venous: 3.68 mmol/L (ref 0.5–1.9)
Lactic Acid, Venous: 10.56 mmol/L (ref 0.5–1.9)

## 2015-12-14 LAB — URINE MICROSCOPIC-ADD ON

## 2015-12-14 MED ORDER — VANCOMYCIN HCL IN DEXTROSE 1-5 GM/200ML-% IV SOLN
1000.0000 mg | Freq: Once | INTRAVENOUS | Status: AC
  Administered 2015-12-14: 1000 mg via INTRAVENOUS
  Filled 2015-12-14: qty 200

## 2015-12-14 MED ORDER — POTASSIUM CHLORIDE 10 MEQ/100ML IV SOLN
10.0000 meq | INTRAVENOUS | Status: AC
  Administered 2015-12-14 (×3): 10 meq via INTRAVENOUS
  Filled 2015-12-14 (×3): qty 100

## 2015-12-14 MED ORDER — ESCITALOPRAM OXALATE 10 MG PO TABS
5.0000 mg | ORAL_TABLET | Freq: Every day | ORAL | Status: DC
  Administered 2015-12-15 – 2015-12-20 (×6): 5 mg via ORAL
  Filled 2015-12-14 (×6): qty 1

## 2015-12-14 MED ORDER — SODIUM CHLORIDE 0.9 % IV BOLUS (SEPSIS)
1000.0000 mL | Freq: Once | INTRAVENOUS | Status: AC
  Administered 2015-12-14: 1000 mL via INTRAVENOUS

## 2015-12-14 MED ORDER — ENOXAPARIN SODIUM 40 MG/0.4ML ~~LOC~~ SOLN
40.0000 mg | SUBCUTANEOUS | Status: DC
  Administered 2015-12-15: 40 mg via SUBCUTANEOUS
  Filled 2015-12-14: qty 0.4

## 2015-12-14 MED ORDER — DONEPEZIL HCL 10 MG PO TABS
10.0000 mg | ORAL_TABLET | Freq: Every day | ORAL | Status: DC
  Administered 2015-12-15 – 2015-12-19 (×5): 10 mg via ORAL
  Filled 2015-12-14 (×6): qty 1

## 2015-12-14 MED ORDER — SODIUM CHLORIDE 0.9 % IV SOLN
INTRAVENOUS | Status: AC
Start: 2015-12-14 — End: 2015-12-19
  Administered 2015-12-14 – 2015-12-15 (×2): via INTRAVENOUS
  Administered 2015-12-17: 1000 mL via INTRAVENOUS
  Administered 2015-12-18: 17:00:00 via INTRAVENOUS

## 2015-12-14 MED ORDER — PANTOPRAZOLE SODIUM 40 MG PO TBEC
40.0000 mg | DELAYED_RELEASE_TABLET | Freq: Every day | ORAL | Status: DC
  Administered 2015-12-15 – 2015-12-20 (×6): 40 mg via ORAL
  Filled 2015-12-14 (×6): qty 1

## 2015-12-14 MED ORDER — PIPERACILLIN-TAZOBACTAM 3.375 G IVPB 30 MIN
3.3750 g | Freq: Once | INTRAVENOUS | Status: AC
  Administered 2015-12-14: 3.375 g via INTRAVENOUS
  Filled 2015-12-14: qty 50

## 2015-12-14 MED ORDER — LEVOTHYROXINE SODIUM 100 MCG PO TABS
100.0000 ug | ORAL_TABLET | Freq: Every day | ORAL | Status: DC
  Administered 2015-12-15 – 2015-12-20 (×6): 100 ug via ORAL
  Filled 2015-12-14 (×7): qty 1

## 2015-12-14 MED ORDER — SODIUM CHLORIDE 0.9 % IV BOLUS (SEPSIS): 1000.0000 mL | Freq: Once | INTRAVENOUS | Status: DC

## 2015-12-14 MED ORDER — CLOPIDOGREL BISULFATE 75 MG PO TABS
75.0000 mg | ORAL_TABLET | Freq: Every day | ORAL | Status: DC
  Administered 2015-12-15 – 2015-12-20 (×6): 75 mg via ORAL
  Filled 2015-12-14 (×6): qty 1

## 2015-12-14 MED ORDER — VANCOMYCIN HCL IN DEXTROSE 1-5 GM/200ML-% IV SOLN
1000.0000 mg | INTRAVENOUS | Status: DC
  Administered 2015-12-15: 1000 mg via INTRAVENOUS
  Filled 2015-12-14 (×3): qty 200

## 2015-12-14 MED ORDER — IOPAMIDOL (ISOVUE-300) INJECTION 61%
INTRAVENOUS | Status: AC
  Administered 2015-12-14: 100 mL
  Filled 2015-12-14: qty 100

## 2015-12-14 MED ORDER — PIPERACILLIN-TAZOBACTAM 3.375 G IVPB
3.3750 g | Freq: Three times a day (TID) | INTRAVENOUS | Status: DC
  Administered 2015-12-14 – 2015-12-16 (×5): 3.375 g via INTRAVENOUS
  Filled 2015-12-14 (×7): qty 50

## 2015-12-14 NOTE — Progress Notes (Signed)
Wife had noted blood in urine, abnormal behavior noted- "out of it, not his normal self."  Burning with urination prev noted. Last night, he didn't realize he was at home.  No fevers. Low BP noted. Still on BP meds at baseline but not taken today- ie BP is still low with missed dose of BP meds.  Vomited a few times recently, more than his typical baseline- he'll occ vomit occ at baseline.    Meds, vitals, and allergies reviewed.   ROS: Per HPI unless specifically indicated in ROS section   Able to follow 1 step commands but speech is slower than typical.  Not able to carry on conversation.  rrr ctab abd soft Ext w/o edema In wheelchair.

## 2015-12-14 NOTE — ED Notes (Signed)
EDP told this RN to hold the bed for now.

## 2015-12-14 NOTE — ED Notes (Signed)
Attempted report 

## 2015-12-14 NOTE — ED Triage Notes (Signed)
Pt sent here from PCP office with BP 72/52. Pt has possible sepsis from UTI. Pt lethargic, but responds to verbal.

## 2015-12-14 NOTE — ED Notes (Signed)
Called [facility] to activate code sepsis  

## 2015-12-14 NOTE — Progress Notes (Signed)
Pre visit review using our clinic review tool, if applicable. No additional management support is needed unless otherwise documented below in the visit note. 

## 2015-12-14 NOTE — ED Provider Notes (Deleted)
[facility] DEPT Provider Note   CSN: [id number] Arrival date & time: 12/14/15  1520     History   Chief Complaint Chief Complaint  Patient presents with  . possible sepsis    HPI [name] is a 82 y.o. male.  HPI  Past Medical History:  Diagnosis Date  . BPH (benign prostatic hypertrophy)   . Dementia   . Diverticulosis of colon   . History of shingles   . Hypertension   . Hypothyroidism   . Multiple fractures   . Pancreatitis 2011   and gallstone pancreatitis and Ecoli bacteremia  . Pneumonia    2016  . Rabbit fever 1940  . Renal mass 2011   Eval by [name] echogenic mass on u/s. followed by [name]- observation as of 09/2010  . Stroke ([name]) 02/2015   with L sided weakness.     Patient Active Problem List   Diagnosis Date Noted  . Severe sepsis ([name]) 12/14/2015  . Cough 09/09/2015  . Orthostatic hypotension 04/29/2015  . Dementia 04/29/2015  . Femur fracture, left ([name]) 04/02/2015  . Distal radial fracture 04/02/2015  . Acute blood loss anemia, post operative  03/17/2015  . Leukocytosis 03/16/2015  . Chronic diastolic CHF (congestive heart failure), NYHA class 1 ([name]) 03/16/2015  . Chronic constipation, unspecified  03/16/2015  . Left radial fracture 108/29/2016  . Recent right MCA stroke ([place]) 02/09/2015  . Essential hypertension 02/09/2015  . Lewy body dementia without behavioral disturbance   . Intertrochanteric fracture of left femur ([name])   . Ascending aortic aneurysm ([name]) 07/19/2012  . Barrett's esophagus 09/28/2006    Past Surgical History:  Procedure Laterality Date  . APPENDECTOMY  1959  . CHOLECYSTECTOMY  10/2009  . EYE MUSCLE SURGERY     12/12 had double vision [name] at [facility]  . JOINT REPLACEMENT  04/30/2001   bilateral TKR  . KNEE ARTHROSCOPY  06/1995   left [name]  . OPEN REDUCTION INTERNAL FIXATION (ORIF) DISTAL RADIAL FRACTURE Left 03/16/2015   Procedure: OPEN REDUCTION INTERNAL FIXATION (ORIF) DISTAL RADIAL  FRACTURE;  Surgeon: Christopher Planas, MD;  Location: [facility];  Service: Orthopedics;  Laterality: Left;  . ORIF FEMUR FRACTURE Left 03/16/2015   Procedure: OPEN REDUCTION INTERNAL FIXATION (ORIF) DISTAL FEMUR FRACTURE;  Surgeon: Christopher Cancel, MD;  Location: [facility];  Service: Orthopedics;  Laterality: Left;  . ORIF HIP FRACTURE Left 12/26/2014   Procedure: OPEN REDUCTION INTERNAL FIXATION HIP;  Surgeon: Christopher Cancel, MD;  Location: [facility];  Service: Orthopedics;  Laterality: Left;  . THYROIDECTOMY, PARTIAL  1966       Home Medications    Prior to Admission medications   Medication Sig Start Date End Date Taking? Authorizing Provider  amLODipine (NORVASC) 10 MG tablet Take 0.5 tablets (5 mg total) by mouth daily. 09/08/15  Yes Christopher Ghent, MD  B Complex-C (B-COMPLEX WITH VITAMIN C) tablet Take 1 tablet by mouth daily. 07/13/12  Yes Christopher Krystal Eaton, MD  clopidogrel (PLAVIX) 75 MG tablet Take 1 tablet (75 mg total) by mouth daily. 10/12/15  Yes Christopher Ghent, MD  docusate sodium (COLACE) 100 MG capsule Take 1 capsule (100 mg total) by mouth 2 (two) times daily. 03/17/15  Yes Christopher Blaze, MD  donepezil (ARICEPT) 10 MG tablet Take 1 tablet (10 mg total) by mouth daily. Patient taking differently: Take 10 mg by mouth at bedtime.  04/23/15  Yes Christopher Ghent, MD  escitalopram (LEXAPRO) 5 MG tablet Take 1  tablet (5 mg total) by mouth daily. 04/23/15  Yes Christopher Ghent, MD  IRON PO Take 1 tablet by mouth 2 (two) times daily.    Yes Historical Provider, MD  levothyroxine (SYNTHROID, LEVOTHROID) 100 MCG tablet TAKE 100 MCG BY MOUTH DAILY 04/23/15  Yes Christopher Ghent, MD  Multiple Vitamin (MULTIVITAMIN) tablet Take 1 tablet by mouth daily.     Yes Historical Provider, MD  ondansetron (ZOFRAN) 4 MG tablet Take 1 tablet (4 mg total) by mouth every 6 (six) hours as needed for nausea. 12/30/14  Yes Christopher P Rama, MD  pantoprazole (PROTONIX) 40 MG tablet Take 1 tablet (40 mg total) by mouth daily. 05/04/15   Yes Christopher Ghent, MD  polyethylene glycol [facility] / GLYCOLAX) packet Take 17 g by mouth daily. Patient taking differently: Take 17 g by mouth daily as needed.  12/30/14  Yes Christopher Maxon Rama, MD  Probiotic Product (PROBIOTIC PO) Take 1 tablet by mouth daily.   Yes Historical Provider, MD  terazosin (HYTRIN) 5 MG capsule Take 1 capsule (5 mg total) by mouth at bedtime. 11/30/15  Yes Christopher Ghent, MD    Family History Family History  Problem Relation Age of Onset  . Hypertension Mother   . Stroke Mother   . Alcohol abuse Father   . Cancer Brother     prostate CA  . Heart disease Brother     Social History Social History  Substance Use Topics  . Smoking status: Never Smoker  . Smokeless tobacco: Never Used  . Alcohol use No     Allergies   Sulfa antibiotics and Valtrex [valacyclovir hcl]   Review of Systems Review of Systems   Physical Exam Updated Vital Signs BP 103/69   Pulse 72   Temp 98 F (36.7 C) (Oral)   Resp 19   Ht 5\' 8"  (1.727 m)   Wt 135 lb (61.2 kg)   SpO2 96%   BMI 20.53 kg/m   Physical Exam   ED Treatments / Results  Labs (all labs ordered are listed, but only abnormal results are displayed) Labs Reviewed  COMPREHENSIVE METABOLIC PANEL - Abnormal; Notable for the following:       Result Value   Potassium 3.2 (*)    Glucose, Bld 178 (*)    BUN 29 (*)    Creatinine, Ser 1.42 (*)    Albumin 3.4 (*)    GFR calc non Af Amer 44 (*)    GFR calc Af Amer 51 (*)    All other components within normal limits  CBC WITH DIFFERENTIAL/PLATELET - Abnormal; Notable for the following:    WBC 37.5 (*)    Neutro Abs 34.4 (*)    Monocytes Absolute 2.3 (*)    All other components within normal limits  URINALYSIS, ROUTINE W REFLEX MICROSCOPIC (NOT AT [facility]) - Abnormal; Notable for the following:    Color, Urine AMBER (*)    APPearance TURBID (*)    Hgb urine dipstick LARGE (*)    Bilirubin Urine SMALL (*)    Ketones, ur 15 (*)    Protein, ur >300 (*)     Nitrite POSITIVE (*)    Leukocytes, UA MODERATE (*)    All other components within normal limits  URINE MICROSCOPIC-ADD ON - Abnormal; Notable for the following:    Squamous Epithelial / LPF 0-5 (*)    Bacteria, UA MANY (*)    All other components within normal limits  I-STAT CG4 LACTIC ACID, ED -  Abnormal; Notable for the following:    Lactic Acid, Venous 3.68 (*)    All other components within normal limits  CULTURE, BLOOD (ROUTINE X 2)  CULTURE, BLOOD (ROUTINE X 2)  URINE CULTURE  I-STAT TROPOININ, ED    EKG  EKG Interpretation  Date/Time:  Monday December 14 2015 15:51:50 EDT Ventricular Rate:  71 PR Interval:    QRS Duration: 98 QT Interval:  420 QTC Calculation: 457 R Axis:   -45 Text Interpretation:  Sinus rhythm Ventricular premature complex LAD, consider left anterior fascicular block Low voltage, extremity and precordial leads No significant change since last tracing Confirmed by Ulrich Soules MD, Alyn Riedinger [id number]) on 12/14/2015 4:08:08 PM       Radiology Dg Chest Port 1 View  Result Date: 12/14/2015 CLINICAL DATA:  Hypotension.  UTI.  Possible sepsis. EXAM: PORTABLE CHEST 1 VIEW COMPARISON:  1May 22, 202016 FINDINGS: Heart size is normal by with left ventricular prominence. The aorta is unfolded. Right lung is clear. There is volume loss and/or infiltrate in the left lower lobe. No visible effusion. Chronic degenerative changes affect the shoulders. IMPRESSION: Left lower lobe collapse/ pneumonia. Electronically Signed   By: Nelson Chimes M.D.   On: 12/14/2015 16:12    Procedures Procedures (including critical care time)  Medications Ordered in ED Medications  vancomycin (VANCOCIN) IVPB 1000 mg/200 mL premix (not administered)  piperacillin-tazobactam (ZOSYN) IVPB 3.375 g (not administered)  potassium chloride 10 mEq in 100 mL IVPB (10 mEq Intravenous New Bag/Given 12/14/15 1712)  sodium chloride 0.9 % bolus 1,000 mL (0 mLs Intravenous Stopped 12/14/15 1712)    And    sodium chloride 0.9 % bolus 1,000 mL (1,000 mLs Intravenous New Bag/Given 12/14/15 1604)  vancomycin (VANCOCIN) IVPB 1000 mg/200 mL premix (0 mg Intravenous Stopped 12/14/15 1711)  piperacillin-tazobactam (ZOSYN) IVPB 3.375 g (0 g Intravenous Stopped 12/14/15 1633)     Initial Impression / Assessment and Plan / ED Course  I have reviewed the triage vital signs and the nursing notes.  Pertinent labs & imaging results that were available during my care of the patient were reviewed by me and considered in my medical decision making (see chart for details).  Clinical Course   82 yo M with PMHx of dementia who p/w hypotension from MD's office. On arrival here, pt AF but hypotensive to 70-80s/50s. Exam as as above - pt ill appearing, dry, with otherwise no focal findings. History, exam c/w severe sepsis 2/2 possible UTI, versus PNA. DDx includes profound hypovolemia, metabolic derangement. Will start CODE SEPSIS, start IVF bolus, and Vanc/Zosyn. No focal neuro findings, no recent falls to suggest intracranial injury as etiology for AMS at this time.  Labs, imaging as above. CBC with marked leukocytosis of 37k. CMP shows AKI, likely pre-renal 2/2 dehydration. LA 3.7. UA + for UTI and CXR also c/w PNA. BP improved with IVF and BP now [id number] systolic. Will admit to stepdown.  Final Clinical Impressions(s) / ED Diagnoses   Final diagnoses:  Severe sepsis ([place])  UTI (lower urinary tract infection)  CAP (community acquired pneumonia)  AKI (acute kidney injury) ([place])  Lactic acidosis      Duffy Bruce, MD 12/14/15 1751

## 2015-12-14 NOTE — ED Provider Notes (Addendum)
[facility] DEPT Provider Note   CSN: [id number] Arrival date & time: 12/14/15  1520     History   Chief Complaint Chief Complaint  Patient presents with  . possible sepsis    HPI [name] is a 82 y.o. male.  HPI   82 year old male with past medical history of dementia who presents with a several day history of increasing confusion, increased coughing, dysuria, and hematuria. Patient's wife states that over the last several days he has become increasingly drowsy and less interactive at home, he began to complain of some mild pain with urination and was noted to have hematuria over the last 48 hours. He has also begun coughing with a productive, yellow sputum cough. He was taken to his primary care office today, at which time he was noted to be hypotensive. He was subsequent sent to the ED for evaluation. Of note, he has not been recently hospitalized last 3 months. He has not been on any antibiotics recently.  Level V caveat: Remainder of history, ROS, and physical exam limited due to patient's AMS, DEMENTIA, SEPSIS.   Past Medical History:  Diagnosis Date  . BPH (benign prostatic hypertrophy)   . Dementia   . Diverticulosis of colon   . History of shingles   . Hypertension   . Hypothyroidism   . Multiple fractures   . Pancreatitis 2011   and gallstone pancreatitis and Ecoli bacteremia  . Pneumonia    2016  . Rabbit fever 1940  . Renal mass 2011   Eval by [name] echogenic mass on u/s. followed by [name]- observation as of 09/2010  . Stroke ([place]) 02/2015   with L sided weakness.     Patient Active Problem List   Diagnosis Date Noted  . Severe sepsis ([place]) 12/14/2015  . Sepsis ([place]) 12/14/2015  . Cough 09/09/2015  . Orthostatic hypotension 04/29/2015  . Dementia 04/29/2015  . Femur fracture, left ([place]) 04/02/2015  . Distal radial fracture 04/02/2015  . Acute blood loss anemia, post operative  03/17/2015  . Leukocytosis 03/16/2015  . Chronic diastolic  CHF (congestive heart failure), NYHA class 1 ([place]) 03/16/2015  . Chronic constipation, unspecified  03/16/2015  . Left radial fracture 12020-10-2014  . Recent right MCA stroke ([place]) 02/09/2015  . Essential hypertension 02/09/2015  . Lewy body dementia without behavioral disturbance   . Intertrochanteric fracture of left femur ([place])   . Ascending aortic aneurysm ([place]) 07/19/2012  . Barrett's esophagus 09/28/2006    Past Surgical History:  Procedure Laterality Date  . APPENDECTOMY  1959  . CHOLECYSTECTOMY  10/2009  . EYE MUSCLE SURGERY     12/12 had double vision Dr. Juleen China at [facility]  . JOINT REPLACEMENT  04/30/2001   bilateral TKR  . KNEE ARTHROSCOPY  06/1995   left Dr Derrel Nip  . OPEN REDUCTION INTERNAL FIXATION (ORIF) DISTAL RADIAL FRACTURE Left 03/16/2015   Procedure: OPEN REDUCTION INTERNAL FIXATION (ORIF) DISTAL RADIAL FRACTURE;  Surgeon: Iran Planas, MD;  Location: [facility];  Service: Orthopedics;  Laterality: Left;  . ORIF FEMUR FRACTURE Left 03/16/2015   Procedure: OPEN REDUCTION INTERNAL FIXATION (ORIF) DISTAL FEMUR FRACTURE;  Surgeon: Paralee Cancel, MD;  Location: [facility];  Service: Orthopedics;  Laterality: Left;  . ORIF HIP FRACTURE Left 12/26/2014   Procedure: OPEN REDUCTION INTERNAL FIXATION HIP;  Surgeon: Paralee Cancel, MD;  Location: [facility];  Service: Orthopedics;  Laterality: Left;  . THYROIDECTOMY, PARTIAL  1966       Home Medications    Prior  to Admission medications   Medication Sig Start Date End Date Taking? Authorizing Provider  amLODipine (NORVASC) 10 MG tablet Take 0.5 tablets (5 mg total) by mouth daily. 09/08/15  Yes Tonia Ghent, MD  B Complex-C (B-COMPLEX WITH VITAMIN C) tablet Take 1 tablet by mouth daily. 07/13/12  Yes Ripudeep Krystal Eaton, MD  clopidogrel (PLAVIX) 75 MG tablet Take 1 tablet (75 mg total) by mouth daily. 10/12/15  Yes Tonia Ghent, MD  docusate sodium (COLACE) 100 MG capsule Take 1 capsule (100 mg total) by mouth 2 (two) times daily. 03/17/15  Yes  Theodis Blaze, MD  donepezil (ARICEPT) 10 MG tablet Take 1 tablet (10 mg total) by mouth daily. Patient taking differently: Take 10 mg by mouth at bedtime.  04/23/15  Yes Tonia Ghent, MD  escitalopram (LEXAPRO) 5 MG tablet Take 1 tablet (5 mg total) by mouth daily. 04/23/15  Yes Tonia Ghent, MD  IRON PO Take 1 tablet by mouth 2 (two) times daily.    Yes Historical Provider, MD  levothyroxine (SYNTHROID, LEVOTHROID) 100 MCG tablet TAKE 100 MCG BY MOUTH DAILY 04/23/15  Yes Tonia Ghent, MD  Multiple Vitamin (MULTIVITAMIN) tablet Take 1 tablet by mouth daily.     Yes Historical Provider, MD  ondansetron (ZOFRAN) 4 MG tablet Take 1 tablet (4 mg total) by mouth every 6 (six) hours as needed for nausea. 12/30/14  Yes Christina P Rama, MD  pantoprazole (PROTONIX) 40 MG tablet Take 1 tablet (40 mg total) by mouth daily. 05/04/15  Yes Tonia Ghent, MD  polyethylene glycol [facility] / GLYCOLAX) packet Take 17 g by mouth daily. Patient taking differently: Take 17 g by mouth daily as needed.  12/30/14  Yes Venetia Maxon Rama, MD  Probiotic Product (PROBIOTIC PO) Take 1 tablet by mouth daily.   Yes Historical Provider, MD  terazosin (HYTRIN) 5 MG capsule Take 1 capsule (5 mg total) by mouth at bedtime. 11/30/15  Yes Tonia Ghent, MD    Family History Family History  Problem Relation Age of Onset  . Hypertension Mother   . Stroke Mother   . Alcohol abuse Father   . Cancer Brother     prostate CA  . Heart disease Brother     Social History Social History  Substance Use Topics  . Smoking status: Never Smoker  . Smokeless tobacco: Never Used  . Alcohol use No     Allergies   Sulfa antibiotics and Valtrex [valacyclovir hcl]   Review of Systems Review of Systems  Unable to perform ROS: Dementia     Physical Exam Updated Vital Signs BP 111/69 (BP Location: Right Arm)   Pulse (!) 102   Temp 98.1 F (36.7 C) (Axillary)   Resp (!) 30   Ht 5\' 8"  (1.727 m)   Wt 135 lb (61.2 kg)    SpO2 94%   BMI 20.53 kg/m   Physical Exam  Constitutional: He appears well-developed. He has a sickly appearance. He appears ill.  HENT:  Head: Normocephalic.  Mouth/Throat: Oropharynx is clear and moist.  Dry mucous membranes  Eyes: Conjunctivae are normal. Pupils are equal, round, and reactive to light.  Neck: Neck supple.  Cardiovascular: Normal rate, regular rhythm and normal heart sounds.  Exam reveals no friction rub.   No murmur heard. Pulmonary/Chest: Effort normal and breath sounds normal. No respiratory distress. He has no wheezes. He has no rales.  Abdominal: Soft. Bowel sounds are normal. He exhibits no distension. There  is no tenderness. There is no guarding.  No CVAT bilaterally  Musculoskeletal: He exhibits no edema.  Neurological: He is alert. He exhibits normal muscle tone.  Skin: Skin is warm. Capillary refill takes 2 to 3 seconds.     ED Treatments / Results  Labs (all labs ordered are listed, but only abnormal results are displayed) Labs Reviewed  COMPREHENSIVE METABOLIC PANEL - Abnormal; Notable for the following:       Result Value   Potassium 3.2 (*)    Glucose, Bld 178 (*)    BUN 29 (*)    Creatinine, Ser 1.42 (*)    Albumin 3.4 (*)    GFR calc non Af Amer 44 (*)    GFR calc Af Amer 51 (*)    All other components within normal limits  CBC WITH DIFFERENTIAL/PLATELET - Abnormal; Notable for the following:    WBC 37.5 (*)    Neutro Abs 34.4 (*)    Monocytes Absolute 2.3 (*)    All other components within normal limits  URINALYSIS, ROUTINE W REFLEX MICROSCOPIC (NOT AT [facility]) - Abnormal; Notable for the following:    Color, Urine AMBER (*)    APPearance TURBID (*)    Hgb urine dipstick LARGE (*)    Bilirubin Urine SMALL (*)    Ketones, ur 15 (*)    Protein, ur >300 (*)    Nitrite POSITIVE (*)    Leukocytes, UA MODERATE (*)    All other components within normal limits  URINE MICROSCOPIC-ADD ON - Abnormal; Notable for the following:    Squamous  Epithelial / LPF 0-5 (*)    Bacteria, UA MANY (*)    All other components within normal limits  CBC - Abnormal; Notable for the following:    WBC 28.9 (*)    RBC 4.08 (*)    Hemoglobin 11.7 (*)    HCT 35.9 (*)    All other components within normal limits  COMPREHENSIVE METABOLIC PANEL - Abnormal; Notable for the following:    Potassium 3.4 (*)    CO2 21 (*)    Glucose, Bld 103 (*)    BUN 29 (*)    Creatinine, Ser 1.50 (*)    Calcium 8.1 (*)    Total Protein 5.0 (*)    Albumin 2.7 (*)    GFR calc non Af Amer 41 (*)    GFR calc Af Amer 48 (*)    All other components within normal limits  I-STAT CG4 LACTIC ACID, ED - Abnormal; Notable for the following:    Lactic Acid, Venous 3.68 (*)    All other components within normal limits  I-STAT CG4 LACTIC ACID, ED - Abnormal; Notable for the following:    Lactic Acid, Venous 10.56 (*)    All other components within normal limits  I-STAT VENOUS BLOOD GAS, ED - Abnormal; Notable for the following:    pCO2, Ven 33.4 (*)    Bicarbonate 15.2 (*)    Acid-base deficit 11.0 (*)    All other components within normal limits  CULTURE, BLOOD (ROUTINE X 2)  CULTURE, BLOOD (ROUTINE X 2)  URINE CULTURE  LACTIC ACID, PLASMA  I-STAT TROPOININ, ED  I-STAT CG4 LACTIC ACID, ED  I-STAT CG4 LACTIC ACID, ED    EKG  EKG Interpretation  Date/Time:  Monday December 14 2015 15:51:50 EDT Ventricular Rate:  71 PR Interval:    QRS Duration: 98 QT Interval:  420 QTC Calculation: 457 R Axis:   -45 Text Interpretation:  Sinus rhythm Ventricular premature complex LAD, consider left anterior fascicular block Low voltage, extremity and precordial leads No significant change since last tracing Confirmed by Sandeep Delagarza MD, Jerene Yeager [id number]) on 12/14/2015 4:08:08 PM       Radiology Ct Abdomen Pelvis W Contrast  Result Date: 12/14/2015 CLINICAL DATA:  Sepsis. Possible urinary tract infection. Lactic acidosis. EXAM: CT ABDOMEN AND PELVIS WITH CONTRAST TECHNIQUE:  Multidetector CT imaging of the abdomen and pelvis was performed using the standard protocol following bolus administration of intravenous contrast. CONTRAST:  1 ISOVUE-300 IOPAMIDOL (ISOVUE-300) INJECTION 61% COMPARISON:  01/13/2014. FINDINGS: Lower chest: Mild bibasilar atelectasis. Hepatobiliary: Cholecystectomy clips.  Unremarkable liver. Pancreas: Unremarkable. No pancreatic ductal dilatation or surrounding inflammatory changes. Spleen: Normal in size without focal abnormality. Adrenals/Urinary Tract: Four small left renal calculi measuring up to 4 mm in maximum diameter each. A large upper pole left renal cyst is unchanged. The previously demonstrated 2.3 cm solid mass arising from the lower pole of the right kidney currently measures 2.8 cm in corresponding diameter. On image number 34 of series 301, this measures 2.8 x 2.1 cm. This previously measured 2.3 x 2.1 cm. A previously demonstrated upper pole right renal cyst has not changed significantly. 3 mm distal right ureteral calculus, approximately 1.5 cm proximal to the ureterovesical junction. Mild dilatation of the ureter above the calculus and mild dilatation of the right renal collecting system. There is also mild diffuse wall thickening and enhancement involving the dilated portion of the right ureter and right renal pelvis. No bladder or left ureteral calculi air seen. Mild diffuse bladder wall thickening. Stomach/Bowel: Prominent stool distending the rectum. Large number of sigmoid and descending colon diverticula. No evidence of diverticulitis. Small to moderate-sized hiatal hernia. No small bowel abnormalities. Surgically absent appendix. Vascular/Lymphatic: No significant vascular findings are present. No enlarged abdominal or pelvic lymph nodes. Reproductive: Mildly to moderately enlarged prostate gland with coarse calcifications. Other: None. Musculoskeletal: Left femoral fixation hardware. Lumbar and lower thoracic spine degenerative changes.  IMPRESSION: 1. 3 mm distal right ureteral calculus causing mild right hydronephrosis and hydroureter. 2. Mild diffuse wall thickening and enhancement involving the dilated right ureter and right renal pelvis, compatible with urinary tract infection. 3. Mild diffuse bladder wall thickening. This could be due to chronic bladder outlet obstruction by the enlarged prostate gland or due to infection involving the bladder. 4. Mild increase in size of the previously demonstrated solid mass in the lower pole of the right kidney. This is demonstrated slow growth since 2012, compatible with a slow growing renal cell carcinoma or oncocytoma. 5. Moderate-sized hiatal hernia. 6. Extensive colonic diverticulosis. 7. Four small, nonobstructing left renal calculi. Electronically Signed   By: Claudie Revering M.D.   On: 12/14/2015 21:28   Dg Chest Port 1 View  Result Date: 12/14/2015 CLINICAL DATA:  Hypotension.  UTI.  Possible sepsis. EXAM: PORTABLE CHEST 1 VIEW COMPARISON:  [id number]-Nov-[id number] FINDINGS: Heart size is normal by with left ventricular prominence. The aorta is unfolded. Right lung is clear. There is volume loss and/or infiltrate in the left lower lobe. No visible effusion. Chronic degenerative changes affect the shoulders. IMPRESSION: Left lower lobe collapse/ pneumonia. Electronically Signed   By: Nelson Chimes M.D.   On: 12/14/2015 16:12    Procedures .Critical Care Performed by: Duffy Bruce Authorized by: Duffy Bruce   Critical care provider statement:    Critical care time (minutes):  45   Critical care time was exclusive of:  Separately billable procedures and treating other patients  Critical care was necessary to treat or prevent imminent or life-threatening deterioration of the following conditions:  Shock and sepsis   Critical care was time spent personally by me on the following activities:  Ordering and performing treatments and interventions, ordering and review of laboratory studies, ordering  and review of radiographic studies, pulse oximetry, re-evaluation of patient's condition, obtaining history from patient or surrogate, development of treatment plan with patient or surrogate, discussions with consultants and evaluation of patient's response to treatment   I assumed direction of critical care for this patient from another provider in my specialty: no     (including critical care time)  Medications Ordered in ED Medications  vancomycin (VANCOCIN) IVPB 1000 mg/200 mL premix (not administered)  piperacillin-tazobactam (ZOSYN) IVPB 3.375 g (3.375 g Intravenous Given 12/14/15 2200)  clopidogrel (PLAVIX) tablet 75 mg (not administered)  pantoprazole (PROTONIX) EC tablet 40 mg (not administered)  donepezil (ARICEPT) tablet 10 mg (not administered)  escitalopram (LEXAPRO) tablet 5 mg (not administered)  levothyroxine (SYNTHROID, LEVOTHROID) tablet 100 mcg (not administered)  enoxaparin (LOVENOX) injection 40 mg (not administered)  0.9 %  sodium chloride infusion ( Intravenous New Bag/Given 12/14/15 2139)  sodium chloride 0.9 % bolus 1,000 mL (not administered)  sodium chloride 0.9 % bolus 1,000 mL (0 mLs Intravenous Stopped 12/14/15 1712)    And  sodium chloride 0.9 % bolus 1,000 mL (1,000 mLs Intravenous Transfusing/Transfer 12/14/15 2019)  vancomycin (VANCOCIN) IVPB 1000 mg/200 mL premix (0 mg Intravenous Stopped 12/14/15 1711)  piperacillin-tazobactam (ZOSYN) IVPB 3.375 g (0 g Intravenous Stopped 12/14/15 1633)  potassium chloride 10 mEq in 100 mL IVPB (0 mEq Intravenous Stopped 12/14/15 2100)  iopamidol (ISOVUE-300) 61 % injection (100 mLs  Contrast Given 12/14/15 2036)     Initial Impression / Assessment and Plan / ED Course  I have reviewed the triage vital signs and the nursing notes.  Pertinent labs & imaging results that were available during my care of the patient were reviewed by me and considered in my medical decision making (see chart for details).  Clinical Course     82 yo M with PMHx of HTN, CVA, dementia who presents with AMS, hypotension, cough, and dysuria/hematuria. Oon arrival, BP 70-80/50-60, VS o/w WNL. Not hypoxic. Suspect sepsis, with consideration of possible UTI, also PNA given cough, sputum production. Source identification limited 2/2 pt's dementia, AMS so will start broad-spectrum ABX per Pharm recommendations. Otherwise, will start fluid resuscitation at 30 cc/kg, plan for admission. Pt protecting airway at this time.  Lab work reviewed as above and is c/w severe sepsis. CBC shows WBC 37.5k. CMP with likely AKI with BUN 29, Cr 1.42. Mild AG likely 2/2 lactic acidosis. UA is highly c/w UTI and CXR also shows PNA. LA elevated at 3.6. Will continue Vanc/Zosyn, IVF, and admit to medicine for severe sepsis 2/2 UTI, also possibly PNA.  Pt admitted to SDU. IVF given. Improving clinically.  Repeat LA 10.56. Unclear etiology for acute rise - [place] be 2/2 reperfusion in setting of improved HD as pt is now actually hypertensive, improved clinically on exam. Must also consider intra-abdominal pathology given degree of lactate - will obtain CT. I notified Hospitalist team/admitting, and they will f/u labs, imaging. Pt BP remains stable, gentle IVF running. Family updated - confirms pt is DNR. He would likely not desire invasive surgery if bowel perf/ischemia is noted.  Final Clinical Impressions(s) / ED Diagnoses   Final diagnoses:  Severe sepsis ([place])  UTI (lower urinary tract infection)  CAP (  community acquired pneumonia)  AKI (acute kidney injury) ([place])  Lactic acidosis    New Prescriptions Current Discharge Medication List       Duffy Bruce, MD 12/15/15 0225    Duffy Bruce, MD 12/15/15 [phone]

## 2015-12-14 NOTE — H&P (Signed)
TRH H&P   Patient Demographics:    [name], is a 82 y.o. male  MRN: [id number]  DOB - [birth year]  Admit Date - 12/14/2015  Outpatient Primary MD for the patient is Elsie Stain, MD  Referring MD/NP/PA: Dr Lindell Noe  Patient coming from: Home  Chief Complaint  Patient presents with  . possible sepsis      HPI:    [name]  is a 82 y.o. male, History of dementia, stroke, BPH, hypertension, hypothyroidism, left femur fracture , status post ORIF distal femur fracture, patient is currently bedbound as was not able to tolerate PT OT. This morning patient's wife noticed that he had bowel movement around 3:30 AM, and after that started having shivering. Wife called PCP and he was seen in the PCP office. He was found to be hypotensive.  Patient was sent to the ED. On arrival patient's blood pressure was 70/50s. Code sepsis was initiated and patient was given IV fluid bolus. Blood pressure stabilized. Started on vancomycin and Zosyn. Lactic acid was 3.68.  Patient denies chest pain, no shortness of breath. Does complain of dysuria. Complains of epigastric pain. Denies passing out. Did have 4 episodes of vomiting this morning, no diarrhea    Review of systems:    In addition to the HPI above,  No Fever-chills, No Headache, No changes with Vision or hearing, No problems swallowing food or Liquids, No Chest pain, +Cough , no Shortness of Breath, No Blood in stool or Urine, No new skin rashes or bruises, No new joints pains-aches,  No new weakness, tingling, numbness in any extremity, No recent weight gain or loss,   A full 10 point Review of Systems was done, except as stated above, all other Review of Systems were negative.   With Past History of the following :    Past Medical History:  Diagnosis Date  . BPH (benign prostatic hypertrophy)   . Dementia   . Diverticulosis of colon    . History of shingles   . Hypertension   . Hypothyroidism   . Multiple fractures   . Pancreatitis 2011   and gallstone pancreatitis and Ecoli bacteremia  . Pneumonia    2016  . Rabbit fever 1940  . Renal mass 2011   Eval by [name] echogenic mass on u/s. followed by [name]- observation as of 09/2010  . Stroke ([place]) 02/2015   with L sided weakness.       Past Surgical History:  Procedure Laterality Date  . APPENDECTOMY  1959  . CHOLECYSTECTOMY  10/2009  . EYE MUSCLE SURGERY     12/12 had double vision Dr. Juleen China at [facility]  . JOINT REPLACEMENT  04/30/2001   bilateral TKR  . KNEE ARTHROSCOPY  06/1995   left Dr Derrel Nip  . OPEN REDUCTION INTERNAL FIXATION (ORIF) DISTAL RADIAL FRACTURE Left 03/16/2015   Procedure: OPEN REDUCTION INTERNAL FIXATION (ORIF) DISTAL RADIAL FRACTURE;  Surgeon: Iran Planas, MD;  Location: [place];  Service: Orthopedics;  Laterality: Left;  .  ORIF FEMUR FRACTURE Left 03/16/2015   Procedure: OPEN REDUCTION INTERNAL FIXATION (ORIF) DISTAL FEMUR FRACTURE;  Surgeon: Paralee Cancel, MD;  Location: [place];  Service: Orthopedics;  Laterality: Left;  . ORIF HIP FRACTURE Left 12/26/2014   Procedure: OPEN REDUCTION INTERNAL FIXATION HIP;  Surgeon: Paralee Cancel, MD;  Location: [facility];  Service: Orthopedics;  Laterality: Left;  . THYROIDECTOMY, PARTIAL  1966      Social History:     Social History  Substance Use Topics  . Smoking status: Never Smoker  . Smokeless tobacco: Never Used  . Alcohol use No        Family History :     Family History  Problem Relation Age of Onset  . Hypertension Mother   . Stroke Mother   . Alcohol abuse Father   . Cancer Brother     prostate CA  . Heart disease Brother       Home Medications:   Prior to Admission medications   Medication Sig Start Date End Date Taking? Authorizing Provider  amLODipine (NORVASC) 10 MG tablet Take 0.5 tablets (5 mg total) by mouth daily. 09/08/15  Yes Tonia Ghent, MD  B Complex-C  (B-COMPLEX WITH VITAMIN C) tablet Take 1 tablet by mouth daily. 07/13/12  Yes Ripudeep Krystal Eaton, MD  clopidogrel (PLAVIX) 75 MG tablet Take 1 tablet (75 mg total) by mouth daily. 10/12/15  Yes Tonia Ghent, MD  docusate sodium (COLACE) 100 MG capsule Take 1 capsule (100 mg total) by mouth 2 (two) times daily. 03/17/15  Yes Theodis Blaze, MD  donepezil (ARICEPT) 10 MG tablet Take 1 tablet (10 mg total) by mouth daily. Patient taking differently: Take 10 mg by mouth at bedtime.  04/23/15  Yes Tonia Ghent, MD  escitalopram (LEXAPRO) 5 MG tablet Take 1 tablet (5 mg total) by mouth daily. 04/23/15  Yes Tonia Ghent, MD  IRON PO Take 1 tablet by mouth 2 (two) times daily.    Yes Historical Provider, MD  levothyroxine (SYNTHROID, LEVOTHROID) 100 MCG tablet TAKE 100 MCG BY MOUTH DAILY 04/23/15  Yes Tonia Ghent, MD  Multiple Vitamin (MULTIVITAMIN) tablet Take 1 tablet by mouth daily.     Yes Historical Provider, MD  ondansetron (ZOFRAN) 4 MG tablet Take 1 tablet (4 mg total) by mouth every 6 (six) hours as needed for nausea. 12/30/14  Yes Christina P Rama, MD  pantoprazole (PROTONIX) 40 MG tablet Take 1 tablet (40 mg total) by mouth daily. 05/04/15  Yes Tonia Ghent, MD  polyethylene glycol [facility] / GLYCOLAX) packet Take 17 g by mouth daily. Patient taking differently: Take 17 g by mouth daily as needed.  12/30/14  Yes Venetia Maxon Rama, MD  Probiotic Product (PROBIOTIC PO) Take 1 tablet by mouth daily.   Yes Historical Provider, MD  terazosin (HYTRIN) 5 MG capsule Take 1 capsule (5 mg total) by mouth at bedtime. 11/30/15  Yes Tonia Ghent, MD     Allergies:     Allergies  Allergen Reactions  . Sulfa Antibiotics Hives  . Valtrex [Valacyclovir Hcl] Nausea And Vomiting    NV     Physical Exam:   Vitals  Blood pressure 103/69, pulse 72, temperature 98 F (36.7 C), temperature source Oral, resp. rate 19, height 5\' 8"  (1.727 m), weight 61.2 kg (135 lb), SpO2 96 %.   1. General Caucasian  male lying in bed in NAD, cooperative with exam  2. Normal affect and insight, Awake Alert, Oriented  X 3.  3. No F.N deficits, ALL C.Nerves Intact, moving all extremities  4. Ears and Eyes appear Normal, Conjunctivae clear, PERRLA. Moist Oral Mucosa.  5. Supple Neck, No JVD, No cervical lymphadenopathy appriciated, No Carotid Bruits.  6. Symmetrical Chest wall movement, Good air movement bilaterally, CTAB.  7. RRR, No Gallops, Rubs or Murmurs, No Parasternal Heave.No Leg edema  8. Positive Bowel Sounds, Abdomen Soft, No tenderness, No organomegaly appriciated,No rebound -guarding or rigidity.  9.  No Cyanosis, Normal Skin Turgor, No Skin Rash or Bruise.       Data Review:    CBC  Recent Labs Lab 12/14/15 1545  WBC 37.5*  HGB 13.2  HCT 40.6  PLT 248  MCV 89.0  MCH 28.9  MCHC 32.5  RDW 14.0  LYMPHSABS 0.8  MONOABS 2.3*  EOSABS 0.0  BASOSABS 0.0   ------------------------------------------------------------------------------------------------------------------  Chemistries   Recent Labs Lab 12/14/15 1545  NA 139  K 3.2*  CL 106  CO2 23  GLUCOSE 178*  BUN 29*  CREATININE 1.42*  CALCIUM 9.0  AST 38  ALT 21  ALKPHOS 79  BILITOT 0.5   ------------------------------------------------------------------------------------------------------------------  ------------------------------------------------------------------------------------------------------------------ GFR: Estimated Creatinine Clearance: 34.1 mL/min (by C-G formula based on SCr of 1.42 mg/dL). Liver Function Tests:  Recent Labs Lab 12/14/15 1545  AST 38  ALT 21  ALKPHOS 79  BILITOT 0.5  PROT 6.5  ALBUMIN 3.4*    --------------------------------------------------------------------------------------------------------------- Urine analysis:    Component Value Date/Time   COLORURINE AMBER (A) 12/14/2015 1625   APPEARANCEUR TURBID (A) 12/14/2015 1625   LABSPEC 1.022 12/14/2015 1625     PHURINE 8.0 12/14/2015 1625   GLUCOSEU NEGATIVE 12/14/2015 1625   HGBUR LARGE (A) 12/14/2015 1625   HGBUR large 03/31/2010 1043   BILIRUBINUR SMALL (A) 12/14/2015 1625   BILIRUBINUR negative 07/13/2015 1555   KETONESUR 15 (A) 12/14/2015 1625   PROTEINUR >300 (A) 12/14/2015 1625   UROBILINOGEN 1.0 07/13/2015 1555   UROBILINOGEN 1.0 02/09/2015 1823   NITRITE POSITIVE (A) 12/14/2015 1625   LEUKOCYTESUR MODERATE (A) 12/14/2015 1625      ----------------------------------------------------------------------------------------------------------------   Imaging Results:    Dg Chest Port 1 View  Result Date: 12/14/2015 CLINICAL DATA:  Hypotension.  UTI.  Possible sepsis. EXAM: PORTABLE CHEST 1 VIEW COMPARISON:  1December 18, 202016 FINDINGS: Heart size is normal by with left ventricular prominence. The aorta is unfolded. Right lung is clear. There is volume loss and/or infiltrate in the left lower lobe. No visible effusion. Chronic degenerative changes affect the shoulders. IMPRESSION: Left lower lobe collapse/ pneumonia. Electronically Signed   By: Nelson Chimes M.D.   On: 12/14/2015 16:12    My personal review of EKG: Rhythm NSR   Assessment & Plan:    Active Problems:   Intertrochanteric fracture of left femur (HCC)   Chronic diastolic CHF (congestive heart failure), NYHA class 1 (HCC)   Dementia   Severe sepsis ([place])   Sepsis ([place])   1. Sepsis- patient presented with hypotension, leukocytosis WBC 37,000, lactic acid 3.68. UA was grossly abnormal, chest x-ray showed left lower lobe collapse/pneumonia. Patient started on sepsis protocol, at this time blood pressure has stabilized after received initial fluid boluses in the ED. We'll continue with vancomycin and Zosyn, IV normal saline at 125 mL per hour. Follow blood culture and urine cultures. Check lactic acid every 3 hours. 2. Chronic bedbound status -Status post ORIF left femur fracture in December 2016- patient is chronically bedbound,  could not tolerate physical therapy. Supportive care 3. Chronic  diastolic CHF-patient has grade 1 diastolic CHF, currently compensated. Will closely monitor patient's intake and output. 4. Lewy  body dementia- without behavior disturbance, continue Aricept 5. History of CVA- continue Plavix 6. Hypokalemia- replace  potassium and check BMP in a.m. 7. Hypothyroidism- stable, continue Synthroid 8. History of BPH- hold Terazosin  due to hypertension. Restart once blood pressure improves. 9. Depression- continue Lexapro 10. Hypertension- hold amlodipine due to hypotension.   DVT Prophylaxis-   Lovenox   AM Labs Ordered, also please review Full Orders  Family Communication: Admission, patients condition and plan of care including tests being ordered have been discussed with the patient and his wife and daughter at bedside* who indicate understanding and agree with the plan and Code Status.  Code Status:  DO NOT RESUSCITATE  Admission status: Inpatient    Time spent in minutes : 60 minutes   Anant Agard S M.D on 12/14/2015 at 6:05 PM  Between 7am to 7pm - Pager - [phone]. After 7pm go to [email or web address] - password [facility]  [facility] - Office  [phone]

## 2015-12-14 NOTE — ED Notes (Signed)
Patient transported to CT 

## 2015-12-14 NOTE — Progress Notes (Signed)
Pharmacy Antibiotic Note  [name] is a 82 y.o. male admitted on 12/14/2015 with sepsis.  Pharmacy has been consulted for vancomycin and zosyn dosing. Pt is afebrile and WBC is still pending. SCr is elevated at 1.43 and lactic acid is 3.68.   Plan: - Vancomycin 1gm IV Q24H - Zosyn 3.375gm IV Q8H (4 hr inf) - F/u renal fxn, C&S, clinical status and trough at SS  Height: 5\' 8"  (172.7 cm) Weight: 135 lb (61.2 kg) IBW/kg (Calculated) : 68.4  Temp (24hrs), Avg:97.9 F (36.6 C), Min:97.7 F (36.5 C), Max:98 F (36.7 C)   Recent Labs Lab 12/14/15 1545 12/14/15 1608  CREATININE 1.42*  --   LATICACIDVEN  --  3.68*    Estimated Creatinine Clearance: 34.1 mL/min (by C-G formula based on SCr of 1.42 mg/dL).    Allergies  Allergen Reactions  . Sulfa Antibiotics Hives  . Valtrex [Valacyclovir Hcl] Nausea And Vomiting    NV    Antimicrobials this admission: Vanc 9/11>> Zosyn 9/1>>  Dose adjustments this admission: N/A  Microbiology results: Pending  Thank you for allowing pharmacy to be a part of this patient's care.  Keia Rask, Rande Lawman 12/14/2015 4:42 PM

## 2015-12-14 NOTE — Patient Instructions (Signed)
Go to ER at [facility].  We'll call ahead.

## 2015-12-15 DIAGNOSIS — S72142D Displaced intertrochanteric fracture of left femur, subsequent encounter for closed fracture with routine healing: Secondary | ICD-10-CM

## 2015-12-15 DIAGNOSIS — R300 Dysuria: Secondary | ICD-10-CM | POA: Insufficient documentation

## 2015-12-15 LAB — BLOOD CULTURE ID PANEL (REFLEXED)
ACINETOBACTER BAUMANNII: NOT DETECTED
CANDIDA ALBICANS: NOT DETECTED
CANDIDA PARAPSILOSIS: NOT DETECTED
CANDIDA TROPICALIS: NOT DETECTED
CARBAPENEM RESISTANCE: NOT DETECTED
Candida glabrata: NOT DETECTED
Candida krusei: NOT DETECTED
ENTEROBACTER CLOACAE COMPLEX: NOT DETECTED
Enterobacteriaceae species: DETECTED — AB
Enterococcus species: NOT DETECTED
Escherichia coli: NOT DETECTED
Haemophilus influenzae: NOT DETECTED
Klebsiella oxytoca: NOT DETECTED
Klebsiella pneumoniae: NOT DETECTED
Listeria monocytogenes: NOT DETECTED
NEISSERIA MENINGITIDIS: NOT DETECTED
PROTEUS SPECIES: DETECTED — AB
Pseudomonas aeruginosa: NOT DETECTED
STAPHYLOCOCCUS AUREUS BCID: NOT DETECTED
STAPHYLOCOCCUS SPECIES: NOT DETECTED
STREPTOCOCCUS AGALACTIAE: NOT DETECTED
STREPTOCOCCUS SPECIES: NOT DETECTED
Serratia marcescens: NOT DETECTED
Streptococcus pneumoniae: NOT DETECTED
Streptococcus pyogenes: NOT DETECTED

## 2015-12-15 LAB — CBC
HEMATOCRIT: 35.9 % — AB (ref 39.0–52.0)
Hemoglobin: 11.7 g/dL — ABNORMAL LOW (ref 13.0–17.0)
MCH: 28.7 pg (ref 26.0–34.0)
MCHC: 32.6 g/dL (ref 30.0–36.0)
MCV: 88 fL (ref 78.0–100.0)
PLATELETS: 162 10*3/uL (ref 150–400)
RBC: 4.08 MIL/uL — ABNORMAL LOW (ref 4.22–5.81)
RDW: 14.1 % (ref 11.5–15.5)
WBC: 28.9 10*3/uL — ABNORMAL HIGH (ref 4.0–10.5)

## 2015-12-15 LAB — COMPREHENSIVE METABOLIC PANEL
ALT: 18 U/L (ref 17–63)
AST: 30 U/L (ref 15–41)
Albumin: 2.7 g/dL — ABNORMAL LOW (ref 3.5–5.0)
Alkaline Phosphatase: 65 U/L (ref 38–126)
Anion gap: 10 (ref 5–15)
BILIRUBIN TOTAL: 0.7 mg/dL (ref 0.3–1.2)
BUN: 29 mg/dL — ABNORMAL HIGH (ref 6–20)
CHLORIDE: 109 mmol/L (ref 101–111)
CO2: 21 mmol/L — ABNORMAL LOW (ref 22–32)
CREATININE: 1.5 mg/dL — AB (ref 0.61–1.24)
Calcium: 8.1 mg/dL — ABNORMAL LOW (ref 8.9–10.3)
GFR, EST AFRICAN AMERICAN: 48 mL/min — AB (ref >60)
GFR, EST NON AFRICAN AMERICAN: 41 mL/min — AB (ref >60)
Glucose, Bld: 103 mg/dL — ABNORMAL HIGH (ref 65–99)
POTASSIUM: 3.4 mmol/L — AB (ref 3.5–5.1)
Sodium: 140 mmol/L (ref 135–145)
TOTAL PROTEIN: 5 g/dL — AB (ref 6.5–8.1)

## 2015-12-15 LAB — GLUCOSE, CAPILLARY: Glucose-Capillary: 106 mg/dL — ABNORMAL HIGH (ref 65–99)

## 2015-12-15 LAB — MRSA PCR SCREENING: MRSA BY PCR: NEGATIVE

## 2015-12-15 LAB — LACTIC ACID, PLASMA: LACTIC ACID, VENOUS: 3 mmol/L — AB (ref 0.5–1.9)

## 2015-12-15 MED ORDER — NOREPINEPHRINE BITARTRATE 1 MG/ML IV SOLN
0.0000 ug/min | INTRAVENOUS | Status: DC
  Filled 2015-12-15 (×2): qty 4

## 2015-12-15 MED ORDER — POTASSIUM CHLORIDE CRYS ER 20 MEQ PO TBCR
40.0000 meq | EXTENDED_RELEASE_TABLET | Freq: Once | ORAL | Status: AC
  Administered 2015-12-15: 40 meq via ORAL
  Filled 2015-12-15: qty 2

## 2015-12-15 MED ORDER — SODIUM CHLORIDE 0.9 % IV BOLUS (SEPSIS)
500.0000 mL | Freq: Once | INTRAVENOUS | Status: AC
  Administered 2015-12-15: 500 mL via INTRAVENOUS

## 2015-12-15 MED ORDER — ACETAMINOPHEN 325 MG PO TABS
650.0000 mg | ORAL_TABLET | Freq: Four times a day (QID) | ORAL | Status: DC | PRN
  Administered 2015-12-15 – 2015-12-18 (×2): 650 mg via ORAL
  Filled 2015-12-15 (×2): qty 2

## 2015-12-15 MED ORDER — ACETAMINOPHEN 650 MG RE SUPP: 650.0000 mg | Freq: Four times a day (QID) | RECTAL | Status: DC | PRN

## 2015-12-15 NOTE — Care Management Note (Signed)
Case Management Note  Patient Details  Name: [name] MRN: [id number] Date of Birth: [birth year]  Subjective/Objective: Patient presents with sepsis, hypotensive, chronic bedbound, chf, hypokalemia, depresseion and htn.  Patient bp cont to be soft, patient will be started on levophed and transferred to ICU.  NCM will cont to follow for dc needs.                    Action/Plan:   Expected Discharge Date:  12/18/15               Expected Discharge Plan:  [place]  In-House Referral:     Discharge planning Services  CM Consult  Post Acute Care Choice:    Choice offered to:     DME Arranged:    DME Agency:     HH Arranged:    HH Agency:     Status of Service:  In process, will continue to follow  If discussed at Long Length of Stay Meetings, dates discussed:    Additional Comments:  Zenon Mayo, RN 12/15/2015, 4:53 PM

## 2015-12-15 NOTE — Progress Notes (Signed)
  PHARMACY - PHYSICIAN COMMUNICATION CRITICAL VALUE ALERT - BLOOD CULTURE IDENTIFICATION (BCID)  Results for orders placed or performed during the hospital encounter of 12/14/15  Blood Culture ID Panel (Reflexed) (Collected: 12/14/2015  1:45 PM)  Result Value Ref Range   Enterococcus species NOT DETECTED NOT DETECTED   Listeria monocytogenes NOT DETECTED NOT DETECTED   Staphylococcus species NOT DETECTED NOT DETECTED   Staphylococcus aureus NOT DETECTED NOT DETECTED   Streptococcus species NOT DETECTED NOT DETECTED   Streptococcus agalactiae NOT DETECTED NOT DETECTED   Streptococcus pneumoniae NOT DETECTED NOT DETECTED   Streptococcus pyogenes NOT DETECTED NOT DETECTED   Acinetobacter baumannii NOT DETECTED NOT DETECTED   Enterobacteriaceae species DETECTED (A) NOT DETECTED   Enterobacter cloacae complex NOT DETECTED NOT DETECTED   Escherichia coli NOT DETECTED NOT DETECTED   Klebsiella oxytoca NOT DETECTED NOT DETECTED   Klebsiella pneumoniae NOT DETECTED NOT DETECTED   Proteus species DETECTED (A) NOT DETECTED   Serratia marcescens NOT DETECTED NOT DETECTED   Carbapenem resistance NOT DETECTED NOT DETECTED   Haemophilus influenzae NOT DETECTED NOT DETECTED   Neisseria meningitidis NOT DETECTED NOT DETECTED   Pseudomonas aeruginosa NOT DETECTED NOT DETECTED   Candida albicans NOT DETECTED NOT DETECTED   Candida glabrata NOT DETECTED NOT DETECTED   Candida krusei NOT DETECTED NOT DETECTED   Candida parapsilosis NOT DETECTED NOT DETECTED   Candida tropicalis NOT DETECTED NOT DETECTED    Name of physician (or Provider) Contacted: G. Lama  Changes to prescribed antibiotics required: 1/2 Blood cx positive with GNRs, BCID reports Proteus. Should be able to de-escalte antibiotics to ceftriaxone but after discussion the provider will keep on broad spectrum antibiotics until tomorrow to make sure patient is clinically improving and there is no other source of infection.   Elenor Quinones, PharmD, BCPS Clinical Pharmacist Pager [phone] 12/15/2015 3:11 PM

## 2015-12-15 NOTE — Assessment & Plan Note (Signed)
Dysuria noted. Unable to collect UA. Clearly different from baseline. Discussed with wife. Concern for sepsis. He is going to go straight to the emergency room. I help load him in the car. Wife is taking in the hospital directly. She agrees with plan. It was likely less traumatic for the patient to go by car, than to go by EMS. This was discussed with wife. She agrees. I called ahead to ER about pending arrival. Appreciate help all all.

## 2015-12-15 NOTE — Progress Notes (Signed)
[facility]  PROGRESS NOTE  [name] Asche [id number] DOB: 1930-07-01 DOA: 12/14/2015 PCP: Elsie Stain, MD    Brief HPI:   82 y.o. male, History of dementia, stroke, BPH, hypertension, hypothyroidism, left femur fracture , status post ORIF distal femur fracture, patient is currently bedbound as was not able to tolerate PT OT. This morning patient's wife noticed that he had bowel movement around 3:30 AM, and after that started having shivering. Wife called PCP and he was seen in the PCP office. He was found to be hypotensive. Patient was sent to the ED. On arrival patient's blood pressure was 70/50s. Code sepsis was initiated and patient was given IV fluid bolus. Blood pressure stabilized. Started on vancomycin and Zosyn. Lactic acid was 3.68.      Assessment/Plan:    1. Sepsis-likely due to UTI, patient presented with hypotension, leukocytosis WBC 37,000, lactic acid 3.68. UA was grossly abnormal, chest x-ray showed left lower lobe collapse/pneumonia. Patient started on sepsis protocol, started on vancomycin and Zosyn, IV normal saline at 125 mL per hour. Follow blood culture and urine cultures.  2. Chronic bedbound status -Status post ORIF left femur fracture in December 2016- patient is chronically bedbound, could not tolerate physical therapy. Supportive care 3. Chronic diastolic CHF-patient has grade 1 diastolic CHF, currently compensated. Will closely monitor patient's intake and output. 4. Lewy  body dementia- without behavior disturbance, continue Aricept 5. History of CVA- continue Plavix 6. Hypokalemia- replace  potassium and check BMP in a.m. 7. Hypothyroidism- stable, continue Synthroid 8. History of BPH- hold Terazosin  due to hypertension. Restart once blood pressure improves. 9. Depression- continue Lexapro 10. Hypertension- hold amlodipine due to hypotension.   DVT prophylaxis: Lovenox Code Status: DNR Family Communication: No family at bedside Disposition  Plan: To be decided   Consultants:  None  Procedures:  None  Antibiotics: Anti-infectives    Start     Dose/Rate Route Frequency Ordered Stop   12/15/15 1600  vancomycin (VANCOCIN) IVPB 1000 mg/200 mL premix     1,000 mg 200 mL/hr over 60 Minutes Intravenous Every 24 hours 12/14/15 1642     12/14/15 2200  piperacillin-tazobactam (ZOSYN) IVPB 3.375 g     3.375 g 12.5 mL/hr over 240 Minutes Intravenous Every 8 hours 12/14/15 1642     12/14/15 1600  vancomycin (VANCOCIN) IVPB 1000 mg/200 mL premix     1,000 mg 200 mL/hr over 60 Minutes Intravenous  Once 12/14/15 1547 12/14/15 1711   12/14/15 1600  piperacillin-tazobactam (ZOSYN) IVPB 3.375 g     3.375 g 100 mL/hr over 30 Minutes Intravenous  Once 12/14/15 1547 12/14/15 1633       Subjective   Patient seen and examined, shivering this morning. MAXIMUM TEMPERATURE 102. Denies pain or shortness of breath.  Objective    Objective: Vitals:   12/14/15 2304 12/15/15 0323 12/15/15 0823 12/15/15 1244  BP: 111/69 93/64 106/78 (!) 70/60  Pulse: (!) 102 76 89 69  Resp: (!) 30 15 (!) 29 (!) 34  Temp: 98.1 F (36.7 C) 98.3 F (36.8 C) (!) 102.3 F (39.1 C) 98.7 F (37.1 C)  TempSrc: Axillary Axillary Oral Oral  SpO2: 94% 95% 93% 95%  Weight:      Height:        Intake/Output Summary (Last 24 hours) at 12/15/15 1319 Last data filed at 12/15/15 0600  Gross per 24 hour  Intake          1243.75 ml  Output  0 ml  Net          1243.75 ml   Filed Weights   12/14/15 1533  Weight: 61.2 kg (135 lb)    Examination:  General exam: Appears in mild distress Respiratory system: Clear to auscultation. Respiratory effort normal. Cardiovascular system: S1 & S2 heard, RRR. No JVD, murmurs, rubs, gallops or clicks. No pedal edema. Gastrointestinal system: Abdomen is nondistended, soft and nontender. No organomegaly or masses felt. Normal bowel sounds heard. Central nervous system: Alert and oriented. No focal  neurological deficits. Extremities: Symmetric 5 x 5 power. Skin: No rashes, lesions or ulcers Psychiatry: Judgement and insight appear normal. Mood & affect appropriate.    Data Reviewed: I have personally reviewed following labs and imaging studies Basic Metabolic Panel:  Recent Labs Lab 12/14/15 1545 12/15/15 0132  NA 139 140  K 3.2* 3.4*  CL 106 109  CO2 23 21*  GLUCOSE 178* 103*  BUN 29* 29*  CREATININE 1.42* 1.50*  CALCIUM 9.0 8.1*   Liver Function Tests:  Recent Labs Lab 12/14/15 1545 12/15/15 0132  AST 38 30  ALT 21 18  ALKPHOS 79 65  BILITOT 0.5 0.7  PROT 6.5 5.0*  ALBUMIN 3.4* 2.7*   No results for input(s): LIPASE, AMYLASE in the last 168 hours. No results for input(s): AMMONIA in the last 168 hours. CBC:  Recent Labs Lab 12/14/15 1545 12/15/15 0132  WBC 37.5* 28.9*  NEUTROABS 34.4*  --   HGB 13.2 11.7*  HCT 40.6 35.9*  MCV 89.0 88.0  PLT 248 162   Cardiac Enzymes: No results for input(s): CKTOTAL, CKMB, CKMBINDEX, TROPONINI in the last 168 hours. BNP (last 3 results) No results for input(s): BNP in the last 8760 hours.  ProBNP (last 3 results) No results for input(s): PROBNP in the last 8760 hours.  CBG: No results for input(s): GLUCAP in the last 168 hours.  Recent Results (from the past 240 hour(s))  MRSA PCR Screening     Status: None   Collection Time: 12/15/15  4:49 AM  Result Value Ref Range Status   MRSA by PCR NEGATIVE NEGATIVE Final    Comment:        The GeneXpert MRSA Assay (FDA approved for NASAL specimens only), is one component of a comprehensive MRSA colonization surveillance program. It is not intended to diagnose MRSA infection nor to guide or monitor treatment for MRSA infections.      Studies: Ct Abdomen Pelvis W Contrast  Result Date: 12/14/2015 CLINICAL DATA:  Sepsis. Possible urinary tract infection. Lactic acidosis. EXAM: CT ABDOMEN AND PELVIS WITH CONTRAST TECHNIQUE: Multidetector CT imaging of the  abdomen and pelvis was performed using the standard protocol following bolus administration of intravenous contrast. CONTRAST:  1 ISOVUE-300 IOPAMIDOL (ISOVUE-300) INJECTION 61% COMPARISON:  01/13/2014. FINDINGS: Lower chest: Mild bibasilar atelectasis. Hepatobiliary: Cholecystectomy clips.  Unremarkable liver. Pancreas: Unremarkable. No pancreatic ductal dilatation or surrounding inflammatory changes. Spleen: Normal in size without focal abnormality. Adrenals/Urinary Tract: Four small left renal calculi measuring up to 4 mm in maximum diameter each. A large upper pole left renal cyst is unchanged. The previously demonstrated 2.3 cm solid mass arising from the lower pole of the right kidney currently measures 2.8 cm in corresponding diameter. On image number 34 of series 301, this measures 2.8 x 2.1 cm. This previously measured 2.3 x 2.1 cm. A previously demonstrated upper pole right renal cyst has not changed significantly. 3 mm distal right ureteral calculus, approximately 1.5 cm proximal to the ureterovesical  junction. Mild dilatation of the ureter above the calculus and mild dilatation of the right renal collecting system. There is also mild diffuse wall thickening and enhancement involving the dilated portion of the right ureter and right renal pelvis. No bladder or left ureteral calculi air seen. Mild diffuse bladder wall thickening. Stomach/Bowel: Prominent stool distending the rectum. Large number of sigmoid and descending colon diverticula. No evidence of diverticulitis. Small to moderate-sized hiatal hernia. No small bowel abnormalities. Surgically absent appendix. Vascular/Lymphatic: No significant vascular findings are present. No enlarged abdominal or pelvic lymph nodes. Reproductive: Mildly to moderately enlarged prostate gland with coarse calcifications. Other: None. Musculoskeletal: Left femoral fixation hardware. Lumbar and lower thoracic spine degenerative changes. IMPRESSION: 1. 3 mm distal right  ureteral calculus causing mild right hydronephrosis and hydroureter. 2. Mild diffuse wall thickening and enhancement involving the dilated right ureter and right renal pelvis, compatible with urinary tract infection. 3. Mild diffuse bladder wall thickening. This could be due to chronic bladder outlet obstruction by the enlarged prostate gland or due to infection involving the bladder. 4. Mild increase in size of the previously demonstrated solid mass in the lower pole of the right kidney. This is demonstrated slow growth since 2012, compatible with a slow growing renal cell carcinoma or oncocytoma. 5. Moderate-sized hiatal hernia. 6. Extensive colonic diverticulosis. 7. Four small, nonobstructing left renal calculi. Electronically Signed   By: Claudie Revering M.D.   On: 12/14/2015 21:28   Dg Chest Port 1 View  Result Date: 12/14/2015 CLINICAL DATA:  Hypotension.  UTI.  Possible sepsis. EXAM: PORTABLE CHEST 1 VIEW COMPARISON:  [id number] FINDINGS: Heart size is normal by with left ventricular prominence. The aorta is unfolded. Right lung is clear. There is volume loss and/or infiltrate in the left lower lobe. No visible effusion. Chronic degenerative changes affect the shoulders. IMPRESSION: Left lower lobe collapse/ pneumonia. Electronically Signed   By: Nelson Chimes M.D.   On: 12/14/2015 16:12    Scheduled Meds: . clopidogrel  75 mg Oral Daily  . donepezil  10 mg Oral QHS  . enoxaparin (LOVENOX) injection  40 mg Subcutaneous Q24H  . escitalopram  5 mg Oral Daily  . levothyroxine  100 mcg Oral QAC breakfast  . pantoprazole  40 mg Oral Daily  . piperacillin-tazobactam (ZOSYN)  IV  3.375 g Intravenous Q8H  . sodium chloride  1,000 mL Intravenous Once  . vancomycin  1,000 mg Intravenous Q24H   Continuous Infusions: . sodium chloride 125 mL/hr at 12/15/15 0552       Time spent: 25 min    [facility] Pager [phone]. If 7PM-7AM, please contact night-coverage at  [email or web address], Office  [phone]  password [email or web address] 12/15/2015, 1:19 PM  LOS: 1 day

## 2015-12-16 DIAGNOSIS — I5032 Chronic diastolic (congestive) heart failure: Secondary | ICD-10-CM | POA: Diagnosis not present

## 2015-12-16 DIAGNOSIS — R7881 Bacteremia: Secondary | ICD-10-CM | POA: Diagnosis not present

## 2015-12-16 DIAGNOSIS — F0390 Unspecified dementia without behavioral disturbance: Secondary | ICD-10-CM | POA: Diagnosis not present

## 2015-12-16 DIAGNOSIS — R6520 Severe sepsis without septic shock: Secondary | ICD-10-CM | POA: Diagnosis not present

## 2015-12-16 DIAGNOSIS — A419 Sepsis, unspecified organism: Secondary | ICD-10-CM | POA: Diagnosis not present

## 2015-12-16 DIAGNOSIS — A4159 Other Gram-negative sepsis: Secondary | ICD-10-CM | POA: Diagnosis not present

## 2015-12-16 DIAGNOSIS — N179 Acute kidney failure, unspecified: Secondary | ICD-10-CM | POA: Diagnosis not present

## 2015-12-16 LAB — LACTIC ACID, PLASMA: LACTIC ACID, VENOUS: 1.6 mmol/L (ref 0.5–1.9)

## 2015-12-16 LAB — COMPREHENSIVE METABOLIC PANEL
ALK PHOS: 74 U/L (ref 38–126)
ALT: 17 U/L (ref 17–63)
AST: 26 U/L (ref 15–41)
Albumin: 2.3 g/dL — ABNORMAL LOW (ref 3.5–5.0)
Anion gap: 7 (ref 5–15)
BILIRUBIN TOTAL: 0.8 mg/dL (ref 0.3–1.2)
BUN: 38 mg/dL — AB (ref 6–20)
CALCIUM: 7.3 mg/dL — AB (ref 8.9–10.3)
CHLORIDE: 112 mmol/L — AB (ref 101–111)
CO2: 18 mmol/L — ABNORMAL LOW (ref 22–32)
CREATININE: 2.11 mg/dL — AB (ref 0.61–1.24)
GFR, EST AFRICAN AMERICAN: 32 mL/min — AB (ref >60)
GFR, EST NON AFRICAN AMERICAN: 27 mL/min — AB (ref >60)
Glucose, Bld: 92 mg/dL (ref 65–99)
Potassium: 4 mmol/L (ref 3.5–5.1)
Sodium: 137 mmol/L (ref 135–145)
Total Protein: 4.8 g/dL — ABNORMAL LOW (ref 6.5–8.1)

## 2015-12-16 LAB — URINE CULTURE

## 2015-12-16 LAB — CBC
HEMATOCRIT: 37 % — AB (ref 39.0–52.0)
HEMOGLOBIN: 12.2 g/dL — AB (ref 13.0–17.0)
MCH: 28.8 pg (ref 26.0–34.0)
MCHC: 33 g/dL (ref 30.0–36.0)
MCV: 87.3 fL (ref 78.0–100.0)
Platelets: 136 10*3/uL — ABNORMAL LOW (ref 150–400)
RBC: 4.24 MIL/uL (ref 4.22–5.81)
RDW: 14.6 % (ref 11.5–15.5)
WBC: 36.5 10*3/uL — AB (ref 4.0–10.5)

## 2015-12-16 MED ORDER — ENOXAPARIN SODIUM 30 MG/0.3ML ~~LOC~~ SOLN
30.0000 mg | SUBCUTANEOUS | Status: DC
  Administered 2015-12-16 – 2015-12-19 (×4): 30 mg via SUBCUTANEOUS
  Filled 2015-12-16 (×5): qty 0.3

## 2015-12-16 MED ORDER — DEXTROSE 5 % IV SOLN
2.0000 g | INTRAVENOUS | Status: AC
  Administered 2015-12-16 – 2015-12-18 (×3): 2 g via INTRAVENOUS
  Filled 2015-12-16 (×3): qty 2

## 2015-12-16 NOTE — Care Management Note (Signed)
Case Management Note  Patient Details  Name: [name] MRN: [id number] Date of Birth: September 17, 1930  Subjective/Objective:     Pt admitted with hypotension -   Code sepsis was initiated              Action/Plan:  PTA from home with wife - per wife pt has been bedridden since femur/hip surgery 12/16.  Wife informed CM that she has all needed equipment for the pt in the home including hospital bed and the expects and wants pt to return home at discharge.  Wife states she has "long term health" and it pays for hired help during the work wee- family assist during the weekends.  Wife denied all barrier to pt returning home.  CM Christopher continue to follow for discharge needs   Expected Discharge Date:  12/18/15               Expected Discharge Plan:  [place]  In-House Referral:     Discharge planning Services  CM Consult  Post Acute Care Choice:    Choice offered to:     DME Arranged:    DME Agency:     HH Arranged:    HH Agency:     Status of Service:  In process, Christopher continue to follow  If discussed at Long Length of Stay Meetings, dates discussed:    Additional Comments:  Maryclare Labrador, RN 12/16/2015, 3:03 PM

## 2015-12-16 NOTE — Progress Notes (Signed)
Pt transferred to 5W26 on tele with no complications.  RN had received report prior to transfer and NT in room upon pts arrival.

## 2015-12-16 NOTE — Progress Notes (Signed)
[place] TEAM 1 - Stepdown/ICU PRATHAM BEISSEL  [id number] DOB: [birth year] DOA: 12/14/2015 PCP: Elsie Stain, MD    Brief Narrative:  82 y.o. male w/ a hx of dementia, stroke, BPH, HTN, hypothyroidism, left femur fracture s/p ORIF Sept 2016 (currently bedbound - unable to tolerate PT/OT) who developed shivering. He was seen by his PCP and found to be hypotensive.  He was sent to the ED where his BP was 70/50. Code sepsis was initiated and patient was given IV fluid bolus. Lactic acid was 3.68.  Subjective: The pt is alert but confused.  He can not tell me where he is or why he is here.  He does not appear to be in pain or acute distress.  He denies any complaints, but his ROS is not felt to be reliable due to confusion.    Assessment & Plan:  Severe Sepsis due to Proteus mirabilis UTI w/ bacteremia  Lactic acid peaked at 10.6 - much improved from hemodynamic standpoint - cont to hydrate - narrow abx   Acute kidney failure Creatinine is worsening - suspect primarily ATN due hypotension   9mm distal R urteral calculus w/ mild R hydro Doubt this is contributing significantly to his ARF but will need to follow - if crt continues to climb will have to consider f/u US  Solid mass in lower pole R kidney  Slightly larger than 2012 - suggestive of "slow growing renal cell CA" - unclear if pt/family aware of this diagnosis - will discuss during this admit   Chronically bed-bound following L hip fx [id number]  Chronic diastolic CHF  No evidence of volume overload at this time  [facility] Weights   12/14/15 1533  Weight: 61.2 kg (135 lb)    Lewy body dementia   Hypokalemia Corrected   Hypothyroidism Cont home medical tx  BPH  HTN Not an active issue at this time   DVT prophylaxis: lovenox Code Status: NO CODE BLUE - DNR  Family Communication: no family present at time of exam  Disposition Plan: safe for SDU   Consultants:  none  Procedures: none  Antimicrobials:  Zosyn  9/11 > 9/13 Vanc 9/11 > 9/13 Rocephin 9/13 >  Objective: Blood pressure 106/82, pulse (!) 53, temperature 98.2 F (36.8 C), temperature source Oral, resp. rate (!) 24, height 5\' 8"  (1.727 m), weight 61.2 kg (135 lb), SpO2 96 %.  Intake/Output Summary (Last 24 hours) at 12/16/15 0830 Last data filed at 12/16/15 0800  Gross per 24 hour  Intake             2700 ml  Output              310 ml  Net             2390 ml   Filed Weights   12/14/15 1533  Weight: 61.2 kg (135 lb)    Examination: General: No acute respiratory distress Lungs: Clear to auscultation bilaterally without wheezes or crackles - poor air movement B bases  Cardiovascular: Regular rate and rhythm without murmur gallop or rub normal S1 and S2 Abdomen: Nontender, nondistended, soft, bowel sounds positive, no rebound, no ascites, no appreciable mass Extremities: No significant cyanosis, clubbing, or edema bilateral lower extremities  CBC:  Recent Labs Lab 12/14/15 1545 12/15/15 0132 12/16/15 0217  WBC 37.5* 28.9* 36.5*  NEUTROABS 34.4*  --   --   HGB 13.2 11.7* 12.2*  HCT 40.6 35.9* 37.0*  MCV 89.0 88.0 87.3  PLT 248 162 [id number]*   Basic Metabolic Panel:  Recent Labs Lab 12/14/15 1545 12/15/15 0132 12/16/15 0217  NA 139 140 137  K 3.2* 3.4* 4.0  CL 106 109 112*  CO2 23 21* 18*  GLUCOSE 178* 103* 92  BUN 29* 29* 38*  CREATININE 1.42* 1.50* 2.11*  CALCIUM 9.0 8.1* 7.3*   GFR: Estimated Creatinine Clearance: 23 mL/min (by C-G formula based on SCr of 2.11 mg/dL (H)).  Liver Function Tests:  Recent Labs Lab 12/14/15 1545 12/15/15 0132 12/16/15 0217  AST 38 30 26  ALT 21 18 17   ALKPHOS 79 65 74  BILITOT 0.5 0.7 0.8  PROT 6.5 5.0* 4.8*  ALBUMIN 3.4* 2.7* 2.3*    HbA1C: Hgb A1c MFr Bld  Date/Time Value Ref Range Status  108/09/202016 05:12 PM 5.8 (H) 4.8 - 5.6 % Final    Comment:    (NOTE)         Pre-diabetes: 5.7 - 6.4         Diabetes: >6.4         Glycemic control for adults with  diabetes: <7.0   02/09/2015 05:55 PM 5.3 4.8 - 5.6 % Final    Comment:    (NOTE)         Pre-diabetes: 5.7 - 6.4         Diabetes: >6.4         Glycemic control for adults with diabetes: <7.0     CBG:  Recent Labs Lab 12/15/15 1542  GLUCAP 106*    Recent Results (from the past 240 hour(s))  Blood Culture (routine x 2)     Status: None (Preliminary result)   Collection Time: 12/14/15  1:45 PM  Result Value Ref Range Status   Specimen Description BLOOD RIGHT FOREARM  Final   Special Requests BOTTLES DRAWN AEROBIC AND ANAEROBIC 5CC  Final   Culture  Setup Time   Final    GRAM NEGATIVE RODS AEROBIC BOTTLE ONLY CRITICAL RESULT CALLED TO, READ BACK BY AND VERIFIED WITH: N. BATCHLEDER, PHARMD AT 1500 ON 12/15/15 BY C. JESSUP, MLT.    Culture GRAM NEGATIVE RODS  Final   Report Status PENDING  Incomplete  Blood Culture ID Panel (Reflexed)     Status: Abnormal   Collection Time: 12/14/15  1:45 PM  Result Value Ref Range Status   Enterococcus species NOT DETECTED NOT DETECTED Final   Listeria monocytogenes NOT DETECTED NOT DETECTED Final   Staphylococcus species NOT DETECTED NOT DETECTED Final   Staphylococcus aureus NOT DETECTED NOT DETECTED Final   Streptococcus species NOT DETECTED NOT DETECTED Final   Streptococcus agalactiae NOT DETECTED NOT DETECTED Final   Streptococcus pneumoniae NOT DETECTED NOT DETECTED Final   Streptococcus pyogenes NOT DETECTED NOT DETECTED Final   Acinetobacter baumannii NOT DETECTED NOT DETECTED Final   Enterobacteriaceae species DETECTED (A) NOT DETECTED Final    Comment: CRITICAL RESULT CALLED TO, READ BACK BY AND VERIFIED WITH: N. BATCHLEDER, PHARMD AT 1500 ON 12/15/15 BY C. JESSUP, MLT.    Enterobacter cloacae complex NOT DETECTED NOT DETECTED Final   Escherichia coli NOT DETECTED NOT DETECTED Final   Klebsiella oxytoca NOT DETECTED NOT DETECTED Final   Klebsiella pneumoniae NOT DETECTED NOT DETECTED Final   Proteus species DETECTED (A) NOT  DETECTED Final    Comment: CRITICAL RESULT CALLED TO, READ BACK BY AND VERIFIED WITH: N. BATCHLEDER, PHARMD AT 1500 ON 12/15/15 BY C. JESSUP, MLT.    Serratia marcescens NOT DETECTED NOT DETECTED Final  Carbapenem resistance NOT DETECTED NOT DETECTED Final   Haemophilus influenzae NOT DETECTED NOT DETECTED Final   Neisseria meningitidis NOT DETECTED NOT DETECTED Final   Pseudomonas aeruginosa NOT DETECTED NOT DETECTED Final   Candida albicans NOT DETECTED NOT DETECTED Final   Candida glabrata NOT DETECTED NOT DETECTED Final   Candida krusei NOT DETECTED NOT DETECTED Final   Candida parapsilosis NOT DETECTED NOT DETECTED Final   Candida tropicalis NOT DETECTED NOT DETECTED Final  Blood Culture (routine x 2)     Status: None (Preliminary result)   Collection Time: 12/14/15  3:50 PM  Result Value Ref Range Status   Specimen Description BLOOD LEFT FOREARM  Final   Special Requests BOTTLES DRAWN AEROBIC AND ANAEROBIC 5CC  Final   Culture NO GROWTH < 24 HOURS  Final   Report Status PENDING  Incomplete  Urine culture     Status: Abnormal   Collection Time: 12/14/15  4:25 PM  Result Value Ref Range Status   Specimen Description URINE, RANDOM  Final   Special Requests NONE  Final   Culture >=100,000 COLONIES/mL PROTEUS MIRABILIS (A)  Final   Report Status 12/16/2015 FINAL  Final   Organism ID, Bacteria PROTEUS MIRABILIS (A)  Final      Susceptibility   Proteus mirabilis - MIC*    AMPICILLIN <=2 SENSITIVE Sensitive     CEFAZOLIN <=4 SENSITIVE Sensitive     CEFTRIAXONE <=1 SENSITIVE Sensitive     CIPROFLOXACIN <=0.25 SENSITIVE Sensitive     GENTAMICIN <=1 SENSITIVE Sensitive     IMIPENEM 2 SENSITIVE Sensitive     NITROFURANTOIN 128 RESISTANT Resistant     TRIMETH/SULFA <=20 SENSITIVE Sensitive     AMPICILLIN/SULBACTAM <=2 SENSITIVE Sensitive     PIP/TAZO <=4 SENSITIVE Sensitive     * >=100,000 COLONIES/mL PROTEUS MIRABILIS  MRSA PCR Screening     Status: None   Collection Time:  12/15/15  4:49 AM  Result Value Ref Range Status   MRSA by PCR NEGATIVE NEGATIVE Final    Comment:        The GeneXpert MRSA Assay (FDA approved for NASAL specimens only), is one component of a comprehensive MRSA colonization surveillance program. It is not intended to diagnose MRSA infection nor to guide or monitor treatment for MRSA infections.      Scheduled Meds: . clopidogrel  75 mg Oral Daily  . donepezil  10 mg Oral QHS  . enoxaparin (LOVENOX) injection  30 mg Subcutaneous Q24H  . escitalopram  5 mg Oral Daily  . levothyroxine  100 mcg Oral QAC breakfast  . pantoprazole  40 mg Oral Daily  . piperacillin-tazobactam (ZOSYN)  IV  3.375 g Intravenous Q8H  . sodium chloride  1,000 mL Intravenous Once  . vancomycin  1,000 mg Intravenous Q24H   Continuous Infusions: . sodium chloride 125 mL/hr at 12/15/15 1602  . norepinephrine (LEVOPHED) Adult infusion Stopped (12/15/15 1445)     LOS: 2 days   Cherene Altes, MD [facility] Office  [phone] Pager - Text Page per Amion as per below:  On-Call/Text Page:      [email or web address]      password [email or web address]  If 7PM-7AM, please contact night-coverage [email or web address] Password [email or web address] 12/16/2015, 8:30 AM

## 2015-12-17 DIAGNOSIS — N179 Acute kidney failure, unspecified: Secondary | ICD-10-CM | POA: Diagnosis not present

## 2015-12-17 DIAGNOSIS — A419 Sepsis, unspecified organism: Secondary | ICD-10-CM

## 2015-12-17 DIAGNOSIS — R7881 Bacteremia: Secondary | ICD-10-CM

## 2015-12-17 DIAGNOSIS — F0390 Unspecified dementia without behavioral disturbance: Secondary | ICD-10-CM | POA: Diagnosis not present

## 2015-12-17 DIAGNOSIS — I5032 Chronic diastolic (congestive) heart failure: Secondary | ICD-10-CM

## 2015-12-17 DIAGNOSIS — R6520 Severe sepsis without septic shock: Secondary | ICD-10-CM

## 2015-12-17 DIAGNOSIS — I10 Essential (primary) hypertension: Secondary | ICD-10-CM

## 2015-12-17 DIAGNOSIS — C641 Malignant neoplasm of right kidney, except renal pelvis: Secondary | ICD-10-CM

## 2015-12-17 DIAGNOSIS — C649 Malignant neoplasm of unspecified kidney, except renal pelvis: Secondary | ICD-10-CM | POA: Diagnosis present

## 2015-12-17 LAB — CBC
HCT: 36.3 % — ABNORMAL LOW (ref 39.0–52.0)
HEMOGLOBIN: 11.9 g/dL — AB (ref 13.0–17.0)
MCH: 28.4 pg (ref 26.0–34.0)
MCHC: 32.8 g/dL (ref 30.0–36.0)
MCV: 86.6 fL (ref 78.0–100.0)
PLATELETS: 122 10*3/uL — AB (ref 150–400)
RBC: 4.19 MIL/uL — AB (ref 4.22–5.81)
RDW: 14.7 % (ref 11.5–15.5)
WBC: 15.4 10*3/uL — AB (ref 4.0–10.5)

## 2015-12-17 LAB — COMPREHENSIVE METABOLIC PANEL
ALBUMIN: 2.1 g/dL — AB (ref 3.5–5.0)
ALK PHOS: 56 U/L (ref 38–126)
ALT: 14 U/L — AB (ref 17–63)
AST: 16 U/L (ref 15–41)
Anion gap: 6 (ref 5–15)
BUN: 32 mg/dL — ABNORMAL HIGH (ref 6–20)
CHLORIDE: 114 mmol/L — AB (ref 101–111)
CO2: 19 mmol/L — AB (ref 22–32)
CREATININE: 1.55 mg/dL — AB (ref 0.61–1.24)
Calcium: 7.4 mg/dL — ABNORMAL LOW (ref 8.9–10.3)
GFR calc non Af Amer: 40 mL/min — ABNORMAL LOW (ref >60)
GFR, EST AFRICAN AMERICAN: 46 mL/min — AB (ref >60)
GLUCOSE: 90 mg/dL (ref 65–99)
Potassium: 3.2 mmol/L — ABNORMAL LOW (ref 3.5–5.1)
SODIUM: 139 mmol/L (ref 135–145)
Total Bilirubin: 0.7 mg/dL (ref 0.3–1.2)
Total Protein: 4.5 g/dL — ABNORMAL LOW (ref 6.5–8.1)

## 2015-12-17 LAB — CULTURE, BLOOD (ROUTINE X 2)

## 2015-12-17 LAB — MAGNESIUM: MAGNESIUM: 1.6 mg/dL — AB (ref 1.7–2.4)

## 2015-12-17 LAB — LACTIC ACID, PLASMA: Lactic Acid, Venous: 1 mmol/L (ref 0.5–1.9)

## 2015-12-17 MED ORDER — POTASSIUM CHLORIDE CRYS ER 20 MEQ PO TBCR
40.0000 meq | EXTENDED_RELEASE_TABLET | Freq: Once | ORAL | Status: AC
  Administered 2015-12-17: 40 meq via ORAL
  Filled 2015-12-17: qty 2

## 2015-12-17 MED ORDER — MAGNESIUM SULFATE 2 GM/50ML IV SOLN
2.0000 g | Freq: Once | INTRAVENOUS | Status: AC
  Administered 2015-12-17: 2 g via INTRAVENOUS
  Filled 2015-12-17: qty 50

## 2015-12-17 NOTE — Progress Notes (Signed)
PROGRESS NOTE    [name]  [id number] DOB: 06/21/1930 DOA: 12/14/2015 PCP: Elsie Stain, MD   Brief Narrative:  82 y.o. WM PMHx Dementia, Stroke with residual Left-sided weakness, BPH, HTN, Hypothyroidism, Left Femur fracture S/P ORIF distal femur fracture, patient is currently bedbound as was not able to tolerate PT OT, Pancreatitis,Rabbit fever, Renal mass evaluated by [name] , .   This morning patient's wife noticed that he had bowel movement around 3:30 AM, and after that started having shivering. Wife called PCP and he was seen in the PCP office. He was found to be hypotensive.  Patient was sent to the ED. On arrival patient's blood pressure was 70/50s. Code sepsis was initiated and patient was given IV fluid bolus. Blood pressure stabilized. Started on vancomycin and Zosyn. Lactic acid was 3.68.  Patient denies chest pain, no shortness of breath. Does complain of dysuria. Complains of epigastric pain. Denies passing out. Did have 4 episodes of vomiting this morning, no diarrhea   Subjective: 9/14  A/O 2 (does not aware, when). NAD. Per wife patient has been bed bound since fracture of his left femur. In addition she should also had left hip fracture per wife.    Assessment & Plan:   Active Problems:   Intertrochanteric fracture of left femur (HCC)   Chronic diastolic CHF (congestive heart failure), NYHA class 1 (HCC)   Dementia   Severe sepsis (HCC)   Sepsis (HCC)   Severe Sepsis due to positive Proteus mirabilis UTI w/ Bacteremia  Lactic acid peaked at 10.6 - much improved from hemodynamic standpoint - cont to hydrate - narrow abx   Acute kidney failure Creatinine is worsening - suspect primarily ATN due hypotension  -Renal function improving Lab Results  Component Value Date   CREATININE 1.55 (H) 12/17/2015   CREATININE 2.11 (H) 12/16/2015   CREATININE 1.50 (H) 12/15/2015    17mm distal R urteral calculus w/ mild R hydro Doubt this is  contributing significantly to his ARF but will need to follow - if crt continues to climb will have to consider f/u US  Solid mass in lower pole Rt kidney/Renal cell cancer  -Slightly larger than 2012,suggestive of "slow growing renal cell CA"  - unclear if pt/family aware of this diagnosis - will discuss during this admit   Chronically bed-bound following L hip fx 2016 -Mattress overlay. Ensure on rotation at all times -Out of bed to chair with help q shift  Chronic diastolic CHF  No evidence of volume overload at this time   Lewy body dementia   Hypokalemia -Potassium goal > 4 -K-Dur 40 mEq  Hypomagnesemia -Magnesium goal> 2 -Magnesium IV 2 g   Hypothyroidism Cont home medical tx  BPH  HTN Not an active issue at this time     DVT prophylaxis: Lovenox Code Status: DO NOT RESUSCITATE Family Communication: Spoke at length with wife. Has help at home by her children. Disposition Plan: Home   Consultants:  None  Procedures/Significant Events:    Cultures 9/11 blood right forearm positive Proteus Mirabilis 9/11 blood left forearm NGTD 9/11 urine positive Proteus Mirabilis 9/12 MRSA by PCR negative    Antimicrobials: Zosyn 9/11 > 9/13 Vanc 9/11 > 9/13 Rocephin 9/13 >   Devices None   LINES / TUBES:  None   Continuous Infusions: . sodium chloride 1,000 mL (12/17/15 0316)     Objective: Vitals:   12/16/15 1900 12/16/15 2000 12/16/15 2045 12/17/15 0615  BP: 123/75 114/78 103/76 125/74  Pulse: 60 61 (!) 59 (!) 55  Resp: 20 20 16 18   Temp:   97.7 F (36.5 C) 97.7 F (36.5 C)  TempSrc:   Oral Oral  SpO2: 94% 96% 97% 96%  Weight:   61.2 kg (134 lb 14.7 oz)   Height:        Intake/Output Summary (Last 24 hours) at 12/17/15 0845 Last data filed at 12/17/15 0616  Gross per 24 hour  Intake          2381.25 ml  Output              900 ml  Net          1481.25 ml   Filed Weights   12/14/15 1533 12/16/15 2045  Weight: 61.2 kg (135 lb)  61.2 kg (134 lb 14.7 oz)    Examination:  General:  A/O 2 (does not aware, when). NAD, No acute respiratory distress Eyes: negative scleral hemorrhage, negative anisocoria, negative icterus ENT: Negative Runny nose, negative gingival bleeding, Neck:  Negative scars, masses, torticollis, lymphadenopathy, JVD Lungs: Clear to auscultation bilaterally without wheezes or crackles Cardiovascular: Regular rate and rhythm without murmur gallop or rub normal S1 and S2 Abdomen: negative abdominal pain, nondistended, positive soft, bowel sounds, no rebound, no ascites, no appreciable mass Extremities: No significant cyanosis, clubbing, or edema bilateral lower extremities Skin: Negative rashes, lesions, ulcers Psychiatric:  Negative depression, negative anxiety, negative fatigue, negative mania  Central nervous system:  Cranial nerves II through XII intact, tongue/uvula midline, all extremities muscle strength 5/5, sensation intact throughout,  negative dysarthria, negative expressive aphasia, negative receptive aphasia.  .     Data Reviewed: Care during the described time interval was provided by me .  I have reviewed this patient's available data, including medical history, events of note, physical examination, and all test results as part of my evaluation. I have personally reviewed and interpreted all radiology studies.  CBC:  Recent Labs Lab 12/14/15 1545 12/15/15 0132 12/16/15 0217  WBC 37.5* 28.9* 36.5*  NEUTROABS 34.4*  --   --   HGB 13.2 11.7* 12.2*  HCT 40.6 35.9* 37.0*  MCV 89.0 88.0 87.3  PLT 248 162 [id number]*   Basic Metabolic Panel:  Recent Labs Lab 12/14/15 1545 12/15/15 0132 12/16/15 0217  NA 139 140 137  K 3.2* 3.4* 4.0  CL 106 109 112*  CO2 23 21* 18*  GLUCOSE 178* 103* 92  BUN 29* 29* 38*  CREATININE 1.42* 1.50* 2.11*  CALCIUM 9.0 8.1* 7.3*   GFR: Estimated Creatinine Clearance: 23 mL/min (by C-G formula based on SCr of 2.11 mg/dL (H)). Liver Function  Tests:  Recent Labs Lab 12/14/15 1545 12/15/15 0132 12/16/15 0217  AST 38 30 26  ALT 21 18 17   ALKPHOS 79 65 74  BILITOT 0.5 0.7 0.8  PROT 6.5 5.0* 4.8*  ALBUMIN 3.4* 2.7* 2.3*   No results for input(s): LIPASE, AMYLASE in the last 168 hours. No results for input(s): AMMONIA in the last 168 hours. Coagulation Profile: No results for input(s): INR, PROTIME in the last 168 hours. Cardiac Enzymes: No results for input(s): CKTOTAL, CKMB, CKMBINDEX, TROPONINI in the last 168 hours. BNP (last 3 results) No results for input(s): PROBNP in the last 8760 hours. HbA1C: No results for input(s): HGBA1C in the last 72 hours. CBG:  Recent Labs Lab 12/15/15 1542  GLUCAP 106*   Lipid Profile: No results for input(s): CHOL, HDL, LDLCALC, TRIG, CHOLHDL, LDLDIRECT in the last 72 hours. Thyroid Function  Tests: No results for input(s): TSH, T4TOTAL, FREET4, T3FREE, THYROIDAB in the last 72 hours. Anemia Panel: No results for input(s): VITAMINB12, FOLATE, FERRITIN, TIBC, IRON, RETICCTPCT in the last 72 hours. Urine analysis:    Component Value Date/Time   COLORURINE AMBER (A) 12/14/2015 1625   APPEARANCEUR TURBID (A) 12/14/2015 1625   LABSPEC 1.022 12/14/2015 1625   PHURINE 8.0 12/14/2015 1625   GLUCOSEU NEGATIVE 12/14/2015 1625   HGBUR LARGE (A) 12/14/2015 1625   HGBUR large 03/31/2010 1043   BILIRUBINUR SMALL (A) 12/14/2015 1625   BILIRUBINUR negative 07/13/2015 1555   KETONESUR 15 (A) 12/14/2015 1625   PROTEINUR >300 (A) 12/14/2015 1625   UROBILINOGEN 1.0 07/13/2015 1555   UROBILINOGEN 1.0 02/09/2015 1823   NITRITE POSITIVE (A) 12/14/2015 1625   LEUKOCYTESUR MODERATE (A) 12/14/2015 1625   Sepsis Labs: @LABRCNTIP (procalcitonin:4,lacticidven:4)  ) Recent Results (from the past 240 hour(s))  Blood Culture (routine x 2)     Status: Abnormal   Collection Time: 12/14/15  1:45 PM  Result Value Ref Range Status   Specimen Description BLOOD RIGHT FOREARM  Final   Special Requests  BOTTLES DRAWN AEROBIC AND ANAEROBIC 5CC  Final   Culture  Setup Time   Final    GRAM NEGATIVE RODS AEROBIC BOTTLE ONLY CRITICAL RESULT CALLED TO, READ BACK BY AND VERIFIED WITH: N. BATCHLEDER, PHARMD AT 1500 ON 12/15/15 BY C. JESSUP, MLT.    Culture PROTEUS MIRABILIS (A)  Final   Report Status 12/17/2015 FINAL  Final   Organism ID, Bacteria PROTEUS MIRABILIS  Final      Susceptibility   Proteus mirabilis - MIC*    AMPICILLIN <=2 SENSITIVE Sensitive     CEFAZOLIN <=4 SENSITIVE Sensitive     CEFEPIME <=1 SENSITIVE Sensitive     CEFTAZIDIME <=1 SENSITIVE Sensitive     CEFTRIAXONE <=1 SENSITIVE Sensitive     CIPROFLOXACIN <=0.25 SENSITIVE Sensitive     GENTAMICIN <=1 SENSITIVE Sensitive     IMIPENEM 2 SENSITIVE Sensitive     TRIMETH/SULFA <=20 SENSITIVE Sensitive     AMPICILLIN/SULBACTAM <=2 SENSITIVE Sensitive     PIP/TAZO <=4 SENSITIVE Sensitive     * PROTEUS MIRABILIS  Blood Culture ID Panel (Reflexed)     Status: Abnormal   Collection Time: 12/14/15  1:45 PM  Result Value Ref Range Status   Enterococcus species NOT DETECTED NOT DETECTED Final   Listeria monocytogenes NOT DETECTED NOT DETECTED Final   Staphylococcus species NOT DETECTED NOT DETECTED Final   Staphylococcus aureus NOT DETECTED NOT DETECTED Final   Streptococcus species NOT DETECTED NOT DETECTED Final   Streptococcus agalactiae NOT DETECTED NOT DETECTED Final   Streptococcus pneumoniae NOT DETECTED NOT DETECTED Final   Streptococcus pyogenes NOT DETECTED NOT DETECTED Final   Acinetobacter baumannii NOT DETECTED NOT DETECTED Final   Enterobacteriaceae species DETECTED (A) NOT DETECTED Final    Comment: CRITICAL RESULT CALLED TO, READ BACK BY AND VERIFIED WITH: N. BATCHLEDER, PHARMD AT 1500 ON 12/15/15 BY C. JESSUP, MLT.    Enterobacter cloacae complex NOT DETECTED NOT DETECTED Final   Escherichia coli NOT DETECTED NOT DETECTED Final   Klebsiella oxytoca NOT DETECTED NOT DETECTED Final   Klebsiella pneumoniae NOT  DETECTED NOT DETECTED Final   Proteus species DETECTED (A) NOT DETECTED Final    Comment: CRITICAL RESULT CALLED TO, READ BACK BY AND VERIFIED WITH: N. BATCHLEDER, PHARMD AT 1500 ON 12/15/15 BY C. JESSUP, MLT.    Serratia marcescens NOT DETECTED NOT DETECTED Final   Carbapenem resistance NOT  DETECTED NOT DETECTED Final   Haemophilus influenzae NOT DETECTED NOT DETECTED Final   Neisseria meningitidis NOT DETECTED NOT DETECTED Final   Pseudomonas aeruginosa NOT DETECTED NOT DETECTED Final   Candida albicans NOT DETECTED NOT DETECTED Final   Candida glabrata NOT DETECTED NOT DETECTED Final   Candida krusei NOT DETECTED NOT DETECTED Final   Candida parapsilosis NOT DETECTED NOT DETECTED Final   Candida tropicalis NOT DETECTED NOT DETECTED Final  Blood Culture (routine x 2)     Status: None (Preliminary result)   Collection Time: 12/14/15  3:50 PM  Result Value Ref Range Status   Specimen Description BLOOD LEFT FOREARM  Final   Special Requests BOTTLES DRAWN AEROBIC AND ANAEROBIC 5CC  Final   Culture NO GROWTH 2 DAYS  Final   Report Status PENDING  Incomplete  Urine culture     Status: Abnormal   Collection Time: 12/14/15  4:25 PM  Result Value Ref Range Status   Specimen Description URINE, RANDOM  Final   Special Requests NONE  Final   Culture >=100,000 COLONIES/mL PROTEUS MIRABILIS (A)  Final   Report Status 12/16/2015 FINAL  Final   Organism ID, Bacteria PROTEUS MIRABILIS (A)  Final      Susceptibility   Proteus mirabilis - MIC*    AMPICILLIN <=2 SENSITIVE Sensitive     CEFAZOLIN <=4 SENSITIVE Sensitive     CEFTRIAXONE <=1 SENSITIVE Sensitive     CIPROFLOXACIN <=0.25 SENSITIVE Sensitive     GENTAMICIN <=1 SENSITIVE Sensitive     IMIPENEM 2 SENSITIVE Sensitive     NITROFURANTOIN 128 RESISTANT Resistant     TRIMETH/SULFA <=20 SENSITIVE Sensitive     AMPICILLIN/SULBACTAM <=2 SENSITIVE Sensitive     PIP/TAZO <=4 SENSITIVE Sensitive     * >=100,000 COLONIES/mL PROTEUS MIRABILIS    MRSA PCR Screening     Status: None   Collection Time: 12/15/15  4:49 AM  Result Value Ref Range Status   MRSA by PCR NEGATIVE NEGATIVE Final    Comment:        The GeneXpert MRSA Assay (FDA approved for NASAL specimens only), is one component of a comprehensive MRSA colonization surveillance program. It is not intended to diagnose MRSA infection nor to guide or monitor treatment for MRSA infections.          Radiology Studies: No results found.      Scheduled Meds: . cefTRIAXone (ROCEPHIN)  IV  2 g Intravenous Q24H  . clopidogrel  75 mg Oral Daily  . donepezil  10 mg Oral QHS  . enoxaparin (LOVENOX) injection  30 mg Subcutaneous Q24H  . escitalopram  5 mg Oral Daily  . levothyroxine  100 mcg Oral QAC breakfast  . pantoprazole  40 mg Oral Daily   Continuous Infusions: . sodium chloride 1,000 mL (12/17/15 0316)     LOS: 3 days    Time spent: 40 minutes    Morris Markham, Geraldo Docker, MD [facility] Pager [phone]   If 7PM-7AM, please contact night-coverage [email or web address] Password [email or web address] 12/17/2015, 8:45 AM

## 2015-12-17 NOTE — Care Management Important Message (Signed)
Important Message  Patient Details  Name: [name] MRN: [id number] Date of Birth: 07-Jul-1930   Medicare Important Message Given:  Yes    Nathen Dizon 12/17/2015, 11:14 AM

## 2015-12-17 NOTE — Progress Notes (Signed)
Late entry for 0845:  Patient transferred from ICU/stepdown via stretcher.  Pleasant man, oriented to self only. Has a history of dementia, is bedbound since12/16. Admitted with sepsis, patient is now afebrile, has a condom catheter and rectal pouch in place.  Oriented to call bell. Bed alarm on for safety.

## 2015-12-18 DIAGNOSIS — J9691 Respiratory failure, unspecified with hypoxia: Secondary | ICD-10-CM | POA: Diagnosis not present

## 2015-12-18 DIAGNOSIS — A419 Sepsis, unspecified organism: Secondary | ICD-10-CM | POA: Diagnosis not present

## 2015-12-18 DIAGNOSIS — R269 Unspecified abnormalities of gait and mobility: Secondary | ICD-10-CM | POA: Diagnosis not present

## 2015-12-18 DIAGNOSIS — I10 Essential (primary) hypertension: Secondary | ICD-10-CM | POA: Diagnosis not present

## 2015-12-18 DIAGNOSIS — E890 Postprocedural hypothyroidism: Secondary | ICD-10-CM | POA: Diagnosis not present

## 2015-12-18 DIAGNOSIS — Z9181 History of falling: Secondary | ICD-10-CM | POA: Diagnosis not present

## 2015-12-18 DIAGNOSIS — A4159 Other Gram-negative sepsis: Secondary | ICD-10-CM | POA: Diagnosis not present

## 2015-12-18 DIAGNOSIS — K219 Gastro-esophageal reflux disease without esophagitis: Secondary | ICD-10-CM | POA: Diagnosis not present

## 2015-12-18 DIAGNOSIS — D62 Acute posthemorrhagic anemia: Secondary | ICD-10-CM | POA: Diagnosis not present

## 2015-12-18 DIAGNOSIS — F0390 Unspecified dementia without behavioral disturbance: Secondary | ICD-10-CM | POA: Diagnosis not present

## 2015-12-18 DIAGNOSIS — I5032 Chronic diastolic (congestive) heart failure: Secondary | ICD-10-CM | POA: Diagnosis not present

## 2015-12-18 DIAGNOSIS — J188 Other pneumonia, unspecified organism: Secondary | ICD-10-CM | POA: Diagnosis not present

## 2015-12-18 DIAGNOSIS — S72145D Nondisplaced intertrochanteric fracture of left femur, subsequent encounter for closed fracture with routine healing: Secondary | ICD-10-CM | POA: Diagnosis not present

## 2015-12-18 DIAGNOSIS — R7881 Bacteremia: Secondary | ICD-10-CM | POA: Diagnosis not present

## 2015-12-18 DIAGNOSIS — R6520 Severe sepsis without septic shock: Secondary | ICD-10-CM | POA: Diagnosis not present

## 2015-12-18 DIAGNOSIS — K5900 Constipation, unspecified: Secondary | ICD-10-CM | POA: Diagnosis not present

## 2015-12-18 DIAGNOSIS — N179 Acute kidney failure, unspecified: Secondary | ICD-10-CM | POA: Diagnosis not present

## 2015-12-18 DIAGNOSIS — Z4789 Encounter for other orthopedic aftercare: Secondary | ICD-10-CM | POA: Diagnosis not present

## 2015-12-18 MED ORDER — CEFUROXIME AXETIL 500 MG PO TABS
500.0000 mg | ORAL_TABLET | Freq: Two times a day (BID) | ORAL | Status: DC
  Administered 2015-12-19 – 2015-12-20 (×3): 500 mg via ORAL
  Filled 2015-12-18 (×4): qty 1

## 2015-12-18 MED ORDER — POTASSIUM CHLORIDE CRYS ER 20 MEQ PO TBCR
40.0000 meq | EXTENDED_RELEASE_TABLET | Freq: Once | ORAL | Status: AC
  Administered 2015-12-18: 40 meq via ORAL
  Filled 2015-12-18: qty 2

## 2015-12-18 NOTE — Consult Note (Signed)
   [facility] Inpatient Consult   12/18/2015  Dallon Dacosta Mears May 13, 1930 [id number]    Patient was assessed for [facility] for community services. Patient was previously active with [facility] as a benefit of  [employer] plan. Chart review reveals: 82 y.o.WM PMHx Dementia, Stroke with residual Left-sided weakness, BPH, HTN, Hypothyroidism, Left Femur fracture S/P ORIF distal femur fracture, patient is currently bedbound as was not able to tolerate PT OT, Pancreatitis,Rabbit fever, Renal mass evaluated by [name].   Met with patient's wife at bedside regarding being restarted with [facility] services.  She states since the patient has broken his femur he has been bed bound and she is using [facility] for his nurse and their long-term care policy she has his care covered and did not feel she needed any care management. Wife states no needs.   A brochure with contact information was given.  . For additional questions or referrals please contact:  Natividad Brood, RN BSN [facility] Liaison  [phone] business mobile phone Toll free office [phone]

## 2015-12-18 NOTE — Progress Notes (Signed)
[name] TEAM 1 - Stepdown/ICU [name]  [id number] DOB: 16-May-1930 DOA: 12/14/2015 PCP: Elsie Stain, MD    Brief Narrative:  82 y.o. male w/ a hx of dementia, stroke, BPH, HTN, hypothyroidism, left femur fracture s/p ORIF Sept 2016 (currently bedbound - unable to tolerate PT/OT) who developed shivering. He was seen by his PCP and found to be hypotensive.  He was sent to the ED where his BP was 70/50. Code sepsis was initiated and patient was given IV fluid bolus. Lactic acid was 3.68.  Subjective: The patient is modestly agitated and fidgeting about in bed.  He complains of severe low back pain.  He denies chest pain or shortness of breath.  His wife is at the bedside and states that he has been somewhat restless today.  Assessment & Plan:  Severe Sepsis due to Proteus mirabilis UTI w/ bacteremia  Lactic acid peaked at 10.6 - hemodynamically stable - transition to oral antibiotic in a.m.  Acute kidney failure Creatinine is now improving - recheck in AM - suspect primarily ATN due hypotension   Recent Labs Lab 12/14/15 1545 12/15/15 0132 12/16/15 0217 12/17/15 0805  CREATININE 1.42* 1.50* 2.11* 1.55*    38mm distal R urteral calculus w/ mild R hydro Creatinine is improving - will need follow-up as an outpatient  Solid mass in lower pole R kidney  Slightly larger than 2012 - suggestive of "slow growing renal cell CA" - unclear if pt/family aware of this diagnosis - will discuss during this admit - given overall condition I suspect treatment would be inappropriate  Chronically bed-bound following L hip fx 2016 Begin to get up into a chair  Chronic diastolic CHF  No evidence of volume overload at this time  [facility] Weights   12/16/15 2045 12/17/15 2113 12/18/15 0645  Weight: 61.2 kg (134 lb 14.7 oz) 68 kg (150 lb) 69.4 kg (153 lb)    Lewy body dementia   Hypokalemia Supplement further and follow  Hypothyroidism Cont home medical tx  BPH  HTN Not an  active issue at this time   DVT prophylaxis: lovenox Code Status: NO CODE BLUE - DNR  Family Communication: Spoke with wife at bedside  Disposition Plan: Get up to chair - slow hydration - potential for discharge in 24-48 hours   Consultants:  none  Procedures: none  Antimicrobials:  Zosyn 9/11 > 9/13 Vanc 9/11 > 9/13 Rocephin 9/13 > 9/15 Ceftin 9/16 >  Objective: Blood pressure 136/88, pulse (!) 57, temperature 97.8 F (36.6 C), temperature source Oral, resp. rate 18, height 5\' 8"  (1.727 m), weight 69.4 kg (153 lb), SpO2 99 %.  Intake/Output Summary (Last 24 hours) at 12/18/15 1640 Last data filed at 12/18/15 0450  Gross per 24 hour  Intake              460 ml  Output              650 ml  Net             -190 ml   Filed Weights   12/16/15 2045 12/17/15 2113 12/18/15 0645  Weight: 61.2 kg (134 lb 14.7 oz) 68 kg (150 lb) 69.4 kg (153 lb)    Examination: General: No acute respiratory distress - alert  Lungs: Clear to auscultation bilaterally without wheeze Cardiovascular: Regular rate and rhythm without murmur gallop or rub  Abdomen: Nontender, nondistended, soft, bowel sounds positive, no rebound, no ascites, no appreciable mass Extremities: No significant cyanosis,  clubbing, edema bilateral lower extremities  CBC:  Recent Labs Lab 12/14/15 1545 12/15/15 0132 12/16/15 0217 12/17/15 0805  WBC 37.5* 28.9* 36.5* 15.4*  NEUTROABS 34.4*  --   --   --   HGB 13.2 11.7* 12.2* 11.9*  HCT 40.6 35.9* 37.0* 36.3*  MCV 89.0 88.0 87.3 86.6  PLT 248 162 136* 123XX123*   Basic Metabolic Panel:  Recent Labs Lab 12/14/15 1545 12/15/15 0132 12/16/15 0217 12/17/15 0805  NA 139 140 137 139  K 3.2* 3.4* 4.0 3.2*  CL 106 109 112* 114*  CO2 23 21* 18* 19*  GLUCOSE 178* 103* 92 90  BUN 29* 29* 38* 32*  CREATININE 1.42* 1.50* 2.11* 1.55*  CALCIUM 9.0 8.1* 7.3* 7.4*  MG  --   --   --  1.6*   GFR: Estimated Creatinine Clearance: 34.9 mL/min (by C-G formula based on SCr of  1.55 mg/dL (H)).  Liver Function Tests:  Recent Labs Lab 12/14/15 1545 12/15/15 0132 12/16/15 0217 12/17/15 0805  AST 38 30 26 16   ALT 21 18 17  14*  ALKPHOS 79 65 74 56  BILITOT 0.5 0.7 0.8 0.7  PROT 6.5 5.0* 4.8* 4.5*  ALBUMIN 3.4* 2.7* 2.3* 2.1*    HbA1C: Hgb A1c MFr Bld  Date/Time Value Ref Range Status  12020-03-515 05:12 PM 5.8 (H) 4.8 - 5.6 % Final    Comment:    (NOTE)         Pre-diabetes: 5.7 - 6.4         Diabetes: >6.4         Glycemic control for adults with diabetes: <7.0   02/09/2015 05:55 PM 5.3 4.8 - 5.6 % Final    Comment:    (NOTE)         Pre-diabetes: 5.7 - 6.4         Diabetes: >6.4         Glycemic control for adults with diabetes: <7.0     CBG:  Recent Labs Lab 12/15/15 1542  GLUCAP 106*    Recent Results (from the past 240 hour(s))  Blood Culture (routine x 2)     Status: Abnormal   Collection Time: 12/14/15  1:45 PM  Result Value Ref Range Status   Specimen Description BLOOD RIGHT FOREARM  Final   Special Requests BOTTLES DRAWN AEROBIC AND ANAEROBIC 5CC  Final   Culture  Setup Time   Final    GRAM NEGATIVE RODS AEROBIC BOTTLE ONLY CRITICAL RESULT CALLED TO, READ BACK BY AND VERIFIED WITH: N. BATCHLEDER, PHARMD AT 1500 ON 12/15/15 BY C. JESSUP, MLT.    Culture PROTEUS MIRABILIS (A)  Final   Report Status 12/17/2015 FINAL  Final   Organism ID, Bacteria PROTEUS MIRABILIS  Final      Susceptibility   Proteus mirabilis - MIC*    AMPICILLIN <=2 SENSITIVE Sensitive     CEFAZOLIN <=4 SENSITIVE Sensitive     CEFEPIME <=1 SENSITIVE Sensitive     CEFTAZIDIME <=1 SENSITIVE Sensitive     CEFTRIAXONE <=1 SENSITIVE Sensitive     CIPROFLOXACIN <=0.25 SENSITIVE Sensitive     GENTAMICIN <=1 SENSITIVE Sensitive     IMIPENEM 2 SENSITIVE Sensitive     TRIMETH/SULFA <=20 SENSITIVE Sensitive     AMPICILLIN/SULBACTAM <=2 SENSITIVE Sensitive     PIP/TAZO <=4 SENSITIVE Sensitive     * PROTEUS MIRABILIS  Blood Culture ID Panel (Reflexed)     Status:  Abnormal   Collection Time: 12/14/15  1:45 PM  Result  Value Ref Range Status   Enterococcus species NOT DETECTED NOT DETECTED Final   Listeria monocytogenes NOT DETECTED NOT DETECTED Final   Staphylococcus species NOT DETECTED NOT DETECTED Final   Staphylococcus aureus NOT DETECTED NOT DETECTED Final   Streptococcus species NOT DETECTED NOT DETECTED Final   Streptococcus agalactiae NOT DETECTED NOT DETECTED Final   Streptococcus pneumoniae NOT DETECTED NOT DETECTED Final   Streptococcus pyogenes NOT DETECTED NOT DETECTED Final   Acinetobacter baumannii NOT DETECTED NOT DETECTED Final   Enterobacteriaceae species DETECTED (A) NOT DETECTED Final    Comment: CRITICAL RESULT CALLED TO, READ BACK BY AND VERIFIED WITH: N. BATCHLEDER, PHARMD AT 1500 ON 12/15/15 BY C. JESSUP, MLT.    Enterobacter cloacae complex NOT DETECTED NOT DETECTED Final   Escherichia coli NOT DETECTED NOT DETECTED Final   Klebsiella oxytoca NOT DETECTED NOT DETECTED Final   Klebsiella pneumoniae NOT DETECTED NOT DETECTED Final   Proteus species DETECTED (A) NOT DETECTED Final    Comment: CRITICAL RESULT CALLED TO, READ BACK BY AND VERIFIED WITH: N. BATCHLEDER, PHARMD AT 1500 ON 12/15/15 BY C. JESSUP, MLT.    Serratia marcescens NOT DETECTED NOT DETECTED Final   Carbapenem resistance NOT DETECTED NOT DETECTED Final   Haemophilus influenzae NOT DETECTED NOT DETECTED Final   Neisseria meningitidis NOT DETECTED NOT DETECTED Final   Pseudomonas aeruginosa NOT DETECTED NOT DETECTED Final   Candida albicans NOT DETECTED NOT DETECTED Final   Candida glabrata NOT DETECTED NOT DETECTED Final   Candida krusei NOT DETECTED NOT DETECTED Final   Candida parapsilosis NOT DETECTED NOT DETECTED Final   Candida tropicalis NOT DETECTED NOT DETECTED Final  Blood Culture (routine x 2)     Status: None (Preliminary result)   Collection Time: 12/14/15  3:50 PM  Result Value Ref Range Status   Specimen Description BLOOD LEFT FOREARM  Final    Special Requests BOTTLES DRAWN AEROBIC AND ANAEROBIC 5CC  Final   Culture NO GROWTH 4 DAYS  Final   Report Status PENDING  Incomplete  Urine culture     Status: Abnormal   Collection Time: 12/14/15  4:25 PM  Result Value Ref Range Status   Specimen Description URINE, RANDOM  Final   Special Requests NONE  Final   Culture >=100,000 COLONIES/mL PROTEUS MIRABILIS (A)  Final   Report Status 12/16/2015 FINAL  Final   Organism ID, Bacteria PROTEUS MIRABILIS (A)  Final      Susceptibility   Proteus mirabilis - MIC*    AMPICILLIN <=2 SENSITIVE Sensitive     CEFAZOLIN <=4 SENSITIVE Sensitive     CEFTRIAXONE <=1 SENSITIVE Sensitive     CIPROFLOXACIN <=0.25 SENSITIVE Sensitive     GENTAMICIN <=1 SENSITIVE Sensitive     IMIPENEM 2 SENSITIVE Sensitive     NITROFURANTOIN 128 RESISTANT Resistant     TRIMETH/SULFA <=20 SENSITIVE Sensitive     AMPICILLIN/SULBACTAM <=2 SENSITIVE Sensitive     PIP/TAZO <=4 SENSITIVE Sensitive     * >=100,000 COLONIES/mL PROTEUS MIRABILIS  MRSA PCR Screening     Status: None   Collection Time: 12/15/15  4:49 AM  Result Value Ref Range Status   MRSA by PCR NEGATIVE NEGATIVE Final    Comment:        The GeneXpert MRSA Assay (FDA approved for NASAL specimens only), is one component of a comprehensive MRSA colonization surveillance program. It is not intended to diagnose MRSA infection nor to guide or monitor treatment for MRSA infections.      Scheduled  Meds: . cefTRIAXone (ROCEPHIN)  IV  2 g Intravenous Q24H  . clopidogrel  75 mg Oral Daily  . donepezil  10 mg Oral QHS  . enoxaparin (LOVENOX) injection  30 mg Subcutaneous Q24H  . escitalopram  5 mg Oral Daily  . levothyroxine  100 mcg Oral QAC breakfast  . pantoprazole  40 mg Oral Daily   Continuous Infusions: . sodium chloride 1,000 mL (12/17/15 0316)     LOS: 4 days   Cherene Altes, MD [facility] Office  [phone] Pager - Text Page per Shea Evans as per below:  On-Call/Text  Page:      [email or web address]      password [email or web address]  If 7PM-7AM, please contact night-coverage [email or web address] Password [email or web address] 12/18/2015, 4:40 PM

## 2015-12-19 DIAGNOSIS — I5032 Chronic diastolic (congestive) heart failure: Secondary | ICD-10-CM | POA: Diagnosis not present

## 2015-12-19 DIAGNOSIS — N179 Acute kidney failure, unspecified: Secondary | ICD-10-CM | POA: Diagnosis not present

## 2015-12-19 DIAGNOSIS — R7881 Bacteremia: Secondary | ICD-10-CM | POA: Diagnosis not present

## 2015-12-19 DIAGNOSIS — A4159 Other Gram-negative sepsis: Secondary | ICD-10-CM | POA: Diagnosis not present

## 2015-12-19 DIAGNOSIS — R6520 Severe sepsis without septic shock: Secondary | ICD-10-CM | POA: Diagnosis not present

## 2015-12-19 DIAGNOSIS — F0390 Unspecified dementia without behavioral disturbance: Secondary | ICD-10-CM | POA: Diagnosis not present

## 2015-12-19 DIAGNOSIS — A419 Sepsis, unspecified organism: Secondary | ICD-10-CM | POA: Diagnosis not present

## 2015-12-19 LAB — CBC
HCT: 36.3 % — ABNORMAL LOW (ref 39.0–52.0)
HEMOGLOBIN: 11.9 g/dL — AB (ref 13.0–17.0)
MCH: 28.4 pg (ref 26.0–34.0)
MCHC: 32.8 g/dL (ref 30.0–36.0)
MCV: 86.6 fL (ref 78.0–100.0)
Platelets: 137 10*3/uL — ABNORMAL LOW (ref 150–400)
RBC: 4.19 MIL/uL — ABNORMAL LOW (ref 4.22–5.81)
RDW: 14.5 % (ref 11.5–15.5)
WBC: 10.8 10*3/uL — AB (ref 4.0–10.5)

## 2015-12-19 LAB — BASIC METABOLIC PANEL
ANION GAP: 3 — AB (ref 5–15)
BUN: 14 mg/dL (ref 6–20)
CALCIUM: 8 mg/dL — AB (ref 8.9–10.3)
CO2: 23 mmol/L (ref 22–32)
Chloride: 116 mmol/L — ABNORMAL HIGH (ref 101–111)
Creatinine, Ser: 1.02 mg/dL (ref 0.61–1.24)
GFR calc Af Amer: >60 mL/min (ref >60)
GLUCOSE: 92 mg/dL (ref 65–99)
Potassium: 3.7 mmol/L (ref 3.5–5.1)
SODIUM: 142 mmol/L (ref 135–145)

## 2015-12-19 LAB — CULTURE, BLOOD (ROUTINE X 2): CULTURE: NO GROWTH

## 2015-12-19 LAB — MAGNESIUM: MAGNESIUM: 1.9 mg/dL (ref 1.7–2.4)

## 2015-12-19 MED ORDER — INFLUENZA VAC SPLIT QUAD 0.5 ML IM SUSY
0.5000 mL | PREFILLED_SYRINGE | INTRAMUSCULAR | Status: AC
  Administered 2015-12-20: 0.5 mL via INTRAMUSCULAR
  Filled 2015-12-19: qty 0.5

## 2015-12-19 NOTE — Progress Notes (Signed)
[name] - Stepdown/ICU [name]  [id number] DOB: [birth year] DOA: 12/14/2015 PCP: Elsie Stain, MD    Brief Narrative:  82 y.o. male w/ a hx of dementia, stroke, BPH, HTN, hypothyroidism, left femur fracture s/p ORIF Sept 2016 (currently bedbound - unable to tolerate PT/OT) who developed shivering. He was seen by his PCP and found to be hypotensive.  He was sent to the ED where his BP was 70/50. Code sepsis was initiated and patient was given IV fluid bolus. Lactic acid was 3.68.  Subjective: The patient is much more comfortable and calm today.  He denies any low back pain.  His wife reports he had a good night and has a good appetite today.  He denies chest pain shortness breath fevers chills nausea or vomiting.  Assessment & Plan:  Severe Sepsis due to Proteus mirabilis UTI w/ bacteremia  Lactic acid peaked at 10.6 - hemodynamically stable - transitioned to oral antibiotic to complete a 14 day course  Acute kidney failure Creatinine has essentially normalized - recheck in AM - suspect primarily ATN due hypotension   Recent Labs Lab 12/14/15 1545 12/15/15 0132 12/16/15 0217 12/17/15 0805 12/19/15 0519  CREATININE Wilkinson.42* Wilkinson.50* 2.11* Wilkinson.55* Wilkinson.02    53mm distal R urteral calculus w/ mild R hydro Renal function not affected - will need follow-up as an outpatient  Solid mass in lower pole R kidney  Slightly larger than 2012 - suggestive of "slow growing renal cell CA" - unclear if pt/family aware of this diagnosis - given overall condition I did not feel consideration for treatment would be appropriate  Chronically bed-bound following L hip fx 2016 Begin to get up into a chair - his wife reports that she is able to handle him at home with equipment and some assistance  Chronic diastolic CHF  No evidence of volume overload at this time - clinically stable on exam Filed Weights   12/17/15 2113 12/18/15 0645 12/19/15 0300  Weight: 68 kg (150 lb) 69.4 kg (153  lb) 68.9 kg (152 lb)    Lewy body dementia  Appears to be at his baseline mental status  Hypokalemia Supplement further and follow  Hypothyroidism Cont home medical tx  BPH  HTN Not an active issue at this time   DVT prophylaxis: lovenox Code Status: NO CODE BLUE - DNR  Family Communication: Spoke with wife at bedside  Disposition Plan: Plan for discharge home tomorrow  Consultants:  none  Procedures: none  Antimicrobials:  Zosyn 9/11 > 9/13 Vanc 9/11 > 9/13 Rocephin 9/13 > 9/15 Ceftin 9/16 >  Objective: Blood pressure (!) 148/90, pulse 63, temperature 98.4 F (36.9 C), resp. rate 19, height 5\' 8"  (Wilkinson.727 m), weight 68.9 kg (152 lb), SpO2 98 %.  Intake/Output Summary (Last 24 hours) at 12/19/15 1305 Last data filed at 12/19/15 1100  Gross per 24 hour  Intake          2681.67 ml  Output                0 ml  Net          2681.67 ml   Filed Weights   12/17/15 2113 12/18/15 0645 12/19/15 0300  Weight: 68 kg (150 lb) 69.4 kg (153 lb) 68.9 kg (152 lb)    Examination: General: No acute respiratory distress - alert And calm Lungs: Clear to auscultation bilaterally  Cardiovascular: Regular rate and rhythm without murmur gallop or rub  Abdomen: Nontender, nondistended, soft,  bowel sounds positive, no rebound, no ascites Extremities: No significant cyanosis, clubbing, or edema bilateral lower extremities  CBC:  Recent Labs Lab 12/14/15 1545 12/15/15 0132 12/16/15 0217 12/17/15 0805 12/19/15 0519  WBC 37.5* 28.9* 36.5* 15.4* 10.8*  NEUTROABS 34.4*  --   --   --   --   HGB 13.2 11.7* 12.2* 11.9* 11.9*  HCT 40.6 35.9* 37.0* 36.3* 36.3*  MCV 89.0 88.0 87.3 86.6 86.6  PLT 248 162 136* 122* 0000000*   Basic Metabolic Panel:  Recent Labs Lab 12/14/15 1545 12/15/15 0132 12/16/15 0217 12/17/15 0805 12/19/15 0519  NA 139 140 137 139 142  K 3.2* 3.4* 4.0 3.2* 3.7  CL 106 109 112* 114* 116*  CO2 23 21* 18* 19* 23  GLUCOSE 178* 103* 92 90 92  BUN 29* 29* 38*  32* 14  CREATININE Wilkinson.42* Wilkinson.50* 2.11* Wilkinson.55* Wilkinson.02  CALCIUM 9.0 8.Wilkinson* 7.3* 7.4* 8.0*  MG  --   --   --  Wilkinson.6* Wilkinson.9   GFR: Estimated Creatinine Clearance: 53.Wilkinson mL/min (by C-G formula based on SCr of Wilkinson.02 mg/dL).  Liver Function Tests:  Recent Labs Lab 12/14/15 1545 12/15/15 0132 12/16/15 0217 12/17/15 0805  AST 38 30 26 16   ALT 21 18 17  14*  ALKPHOS 79 65 74 56  BILITOT 0.5 0.7 0.8 0.7  PROT 6.5 5.0* 4.8* 4.5*  ALBUMIN 3.4* 2.7* 2.3* 2.Wilkinson*    HbA1C: Hgb A1c MFr Bld  Date/Time Value Ref Range Status  1August 17, 202016 05:12 PM 5.8 (H) 4.8 - 5.6 % Final    Comment:    (NOTE)         Pre-diabetes: 5.7 - 6.4         Diabetes: >6.4         Glycemic control for adults with diabetes: <7.0   02/09/2015 05:55 PM 5.3 4.8 - 5.6 % Final    Comment:    (NOTE)         Pre-diabetes: 5.7 - 6.4         Diabetes: >6.4         Glycemic control for adults with diabetes: <7.0     CBG:  Recent Labs Lab 12/15/15 1542  GLUCAP 106*    Recent Results (from the past 240 hour(s))  Blood Culture (routine x 2)     Status: Abnormal   Collection Time: 12/14/15  Wilkinson:45 PM  Result Value Ref Range Status   Specimen Description BLOOD RIGHT FOREARM  Final   Special Requests BOTTLES DRAWN AEROBIC AND ANAEROBIC 5CC  Final   Culture  Setup Time   Final    GRAM NEGATIVE RODS AEROBIC BOTTLE ONLY CRITICAL RESULT CALLED TO, READ BACK BY AND VERIFIED WITH: N. BATCHLEDER, PHARMD AT 1500 ON 12/15/15 BY C. JESSUP, MLT.    Culture PROTEUS MIRABILIS (A)  Final   Report Status 12/17/2015 FINAL  Final   Organism ID, Bacteria PROTEUS MIRABILIS  Final      Susceptibility   Proteus mirabilis - MIC*    AMPICILLIN <=2 SENSITIVE Sensitive     CEFAZOLIN <=4 SENSITIVE Sensitive     CEFEPIME <=Wilkinson SENSITIVE Sensitive     CEFTAZIDIME <=Wilkinson SENSITIVE Sensitive     CEFTRIAXONE <=Wilkinson SENSITIVE Sensitive     CIPROFLOXACIN <=0.25 SENSITIVE Sensitive     GENTAMICIN <=Wilkinson SENSITIVE Sensitive     IMIPENEM 2 SENSITIVE Sensitive      TRIMETH/SULFA <=20 SENSITIVE Sensitive     AMPICILLIN/SULBACTAM <=2 SENSITIVE Sensitive     PIP/TAZO <=  4 SENSITIVE Sensitive     * PROTEUS MIRABILIS  Blood Culture ID Panel (Reflexed)     Status: Abnormal   Collection Time: 12/14/15  Wilkinson:45 PM  Result Value Ref Range Status   Enterococcus species NOT DETECTED NOT DETECTED Final   Listeria monocytogenes NOT DETECTED NOT DETECTED Final   Staphylococcus species NOT DETECTED NOT DETECTED Final   Staphylococcus aureus NOT DETECTED NOT DETECTED Final   Streptococcus species NOT DETECTED NOT DETECTED Final   Streptococcus agalactiae NOT DETECTED NOT DETECTED Final   Streptococcus pneumoniae NOT DETECTED NOT DETECTED Final   Streptococcus pyogenes NOT DETECTED NOT DETECTED Final   Acinetobacter baumannii NOT DETECTED NOT DETECTED Final   Enterobacteriaceae species DETECTED (A) NOT DETECTED Final    Comment: CRITICAL RESULT CALLED TO, READ BACK BY AND VERIFIED WITH: N. BATCHLEDER, PHARMD AT 1500 ON 12/15/15 BY C. JESSUP, MLT.    Enterobacter cloacae complex NOT DETECTED NOT DETECTED Final   Escherichia coli NOT DETECTED NOT DETECTED Final   Klebsiella oxytoca NOT DETECTED NOT DETECTED Final   Klebsiella pneumoniae NOT DETECTED NOT DETECTED Final   Proteus species DETECTED (A) NOT DETECTED Final    Comment: CRITICAL RESULT CALLED TO, READ BACK BY AND VERIFIED WITH: N. BATCHLEDER, PHARMD AT 1500 ON 12/15/15 BY C. JESSUP, MLT.    Serratia marcescens NOT DETECTED NOT DETECTED Final   Carbapenem resistance NOT DETECTED NOT DETECTED Final   Haemophilus influenzae NOT DETECTED NOT DETECTED Final   Neisseria meningitidis NOT DETECTED NOT DETECTED Final   Pseudomonas aeruginosa NOT DETECTED NOT DETECTED Final   Candida albicans NOT DETECTED NOT DETECTED Final   Candida glabrata NOT DETECTED NOT DETECTED Final   Candida krusei NOT DETECTED NOT DETECTED Final   Candida parapsilosis NOT DETECTED NOT DETECTED Final   Candida tropicalis NOT DETECTED NOT  DETECTED Final  Blood Culture (routine x 2)     Status: None   Collection Time: 12/14/15  3:50 PM  Result Value Ref Range Status   Specimen Description BLOOD LEFT FOREARM  Final   Special Requests BOTTLES DRAWN AEROBIC AND ANAEROBIC 5CC  Final   Culture NO GROWTH 5 DAYS  Final   Report Status 12/19/2015 FINAL  Final  Urine culture     Status: Abnormal   Collection Time: 12/14/15  4:25 PM  Result Value Ref Range Status   Specimen Description URINE, RANDOM  Final   Special Requests NONE  Final   Culture >=100,000 COLONIES/mL PROTEUS MIRABILIS (A)  Final   Report Status 12/16/2015 FINAL  Final   Organism ID, Bacteria PROTEUS MIRABILIS (A)  Final      Susceptibility   Proteus mirabilis - MIC*    AMPICILLIN <=2 SENSITIVE Sensitive     CEFAZOLIN <=4 SENSITIVE Sensitive     CEFTRIAXONE <=Wilkinson SENSITIVE Sensitive     CIPROFLOXACIN <=0.25 SENSITIVE Sensitive     GENTAMICIN <=Wilkinson SENSITIVE Sensitive     IMIPENEM 2 SENSITIVE Sensitive     NITROFURANTOIN 128 RESISTANT Resistant     TRIMETH/SULFA <=20 SENSITIVE Sensitive     AMPICILLIN/SULBACTAM <=2 SENSITIVE Sensitive     PIP/TAZO <=4 SENSITIVE Sensitive     * >=100,000 COLONIES/mL PROTEUS MIRABILIS  MRSA PCR Screening     Status: None   Collection Time: 12/15/15  4:49 AM  Result Value Ref Range Status   MRSA by PCR NEGATIVE NEGATIVE Final    Comment:        The GeneXpert MRSA Assay (FDA approved for NASAL specimens only), is one  component of a comprehensive MRSA colonization surveillance program. It is not intended to diagnose MRSA infection nor to guide or monitor treatment for MRSA infections.      Scheduled Meds: . cefUROXime  500 mg Oral BID WC  . clopidogrel  75 mg Oral Daily  . donepezil  10 mg Oral QHS  . enoxaparin (LOVENOX) injection  30 mg Subcutaneous Q24H  . escitalopram  5 mg Oral Daily  . levothyroxine  100 mcg Oral QAC breakfast  . pantoprazole  40 mg Oral Daily     LOS: 5 days   Cherene Altes, MD [facility] Office  [phone] Pager - Text Page per Amion as per below:  On-Call/Text Page:      [email or web address]      password [email or web address]  If 7PM-7AM, please contact night-coverage [email or web address] Password [email or web address] 12/19/2015, Wilkinson:05 PM

## 2015-12-19 NOTE — Progress Notes (Signed)
OT received order, however per chart pt is chronically bed bound since Dec 2016 when he had a ORIF of femur and is not able to tolerate therapy. OT will sign off. [place], [place] [phone] 12/19/2015

## 2015-12-20 DIAGNOSIS — A419 Sepsis, unspecified organism: Secondary | ICD-10-CM | POA: Diagnosis not present

## 2015-12-20 DIAGNOSIS — I5032 Chronic diastolic (congestive) heart failure: Secondary | ICD-10-CM | POA: Diagnosis not present

## 2015-12-20 DIAGNOSIS — A4159 Other Gram-negative sepsis: Secondary | ICD-10-CM | POA: Diagnosis not present

## 2015-12-20 DIAGNOSIS — F0390 Unspecified dementia without behavioral disturbance: Secondary | ICD-10-CM | POA: Diagnosis not present

## 2015-12-20 DIAGNOSIS — R7881 Bacteremia: Secondary | ICD-10-CM | POA: Diagnosis not present

## 2015-12-20 DIAGNOSIS — R6520 Severe sepsis without septic shock: Secondary | ICD-10-CM | POA: Diagnosis not present

## 2015-12-20 DIAGNOSIS — N179 Acute kidney failure, unspecified: Secondary | ICD-10-CM | POA: Diagnosis not present

## 2015-12-20 LAB — CBC
HEMATOCRIT: 35.4 % — AB (ref 39.0–52.0)
HEMOGLOBIN: 11.9 g/dL — AB (ref 13.0–17.0)
MCH: 28.7 pg (ref 26.0–34.0)
MCHC: 33.6 g/dL (ref 30.0–36.0)
MCV: 85.3 fL (ref 78.0–100.0)
Platelets: 166 10*3/uL (ref 150–400)
RBC: 4.15 MIL/uL — ABNORMAL LOW (ref 4.22–5.81)
RDW: 14.3 % (ref 11.5–15.5)
WBC: 11.2 10*3/uL — ABNORMAL HIGH (ref 4.0–10.5)

## 2015-12-20 LAB — BASIC METABOLIC PANEL
ANION GAP: 5 (ref 5–15)
BUN: 8 mg/dL (ref 6–20)
CO2: 25 mmol/L (ref 22–32)
Calcium: 8 mg/dL — ABNORMAL LOW (ref 8.9–10.3)
Chloride: 110 mmol/L (ref 101–111)
Creatinine, Ser: 0.88 mg/dL (ref 0.61–1.24)
GFR calc Af Amer: >60 mL/min (ref >60)
GLUCOSE: 94 mg/dL (ref 65–99)
POTASSIUM: 3 mmol/L — AB (ref 3.5–5.1)
Sodium: 140 mmol/L (ref 135–145)

## 2015-12-20 MED ORDER — POTASSIUM CHLORIDE CRYS ER 20 MEQ PO TBCR: 40.0000 meq | EXTENDED_RELEASE_TABLET | Freq: Once | ORAL | Status: DC

## 2015-12-20 MED ORDER — CEFUROXIME AXETIL 500 MG PO TABS: 500.0000 mg | ORAL_TABLET | Freq: Two times a day (BID) | ORAL | 0 refills | Status: DC

## 2015-12-20 MED ORDER — POTASSIUM CHLORIDE CRYS ER 20 MEQ PO TBCR
40.0000 meq | EXTENDED_RELEASE_TABLET | Freq: Every day | ORAL | Status: DC
Start: 2015-12-21 — End: 2015-12-20

## 2015-12-20 MED ORDER — POTASSIUM CHLORIDE CRYS ER 20 MEQ PO TBCR: 40.0000 meq | EXTENDED_RELEASE_TABLET | Freq: Every day | ORAL | 0 refills | Status: DC

## 2015-12-20 MED ORDER — POTASSIUM CHLORIDE CRYS ER 20 MEQ PO TBCR: 40.0000 meq | EXTENDED_RELEASE_TABLET | Freq: Two times a day (BID) | ORAL | Status: DC

## 2015-12-20 NOTE — Discharge Instructions (Signed)

## 2015-12-20 NOTE — Progress Notes (Signed)
Nsg Discharge Note  Admit Date:  12/14/2015 Discharge date: 12/20/2015   [name] to be D/C'd Home per MD order.  AVS completed.  Copy for chart, and copy for patient signed, and dated. Patient/caregiver able to verbalize understanding.  Discharge Medication:   Medication List    TAKE these medications   amLODipine 10 MG tablet Commonly known as:  NORVASC Take 0.5 tablets (5 mg total) by mouth daily.   B-complex with vitamin C tablet Take 1 tablet by mouth daily.   cefUROXime 500 MG tablet Commonly known as:  CEFTIN Take 1 tablet (500 mg total) by mouth 2 (two) times daily with a meal.   clopidogrel 75 MG tablet Commonly known as:  PLAVIX Take 1 tablet (75 mg total) by mouth daily.   docusate sodium 100 MG capsule Commonly known as:  COLACE Take 1 capsule (100 mg total) by mouth 2 (two) times daily.   donepezil 10 MG tablet Commonly known as:  ARICEPT Take 1 tablet (10 mg total) by mouth daily. What changed:  when to take this   escitalopram 5 MG tablet Commonly known as:  LEXAPRO Take 1 tablet (5 mg total) by mouth daily.   IRON PO Take 1 tablet by mouth 2 (two) times daily.   levothyroxine 100 MCG tablet Commonly known as:  SYNTHROID, LEVOTHROID TAKE 100 MCG BY MOUTH DAILY   multivitamin tablet Take 1 tablet by mouth daily.   ondansetron 4 MG tablet Commonly known as:  ZOFRAN Take 1 tablet (4 mg total) by mouth every 6 (six) hours as needed for nausea.   pantoprazole 40 MG tablet Commonly known as:  PROTONIX Take 1 tablet (40 mg total) by mouth daily.   polyethylene glycol packet Commonly known as:  MIRALAX / GLYCOLAX Take 17 g by mouth daily. What changed:  when to take this  reasons to take this   potassium chloride SA 20 MEQ tablet Commonly known as:  K-DUR,KLOR-CON Take 2 tablets (40 mEq total) by mouth daily. Start taking on:  12/21/2015   PROBIOTIC PO Take 1 tablet by mouth daily.   terazosin 5 MG capsule Commonly known as:   HYTRIN Take 1 capsule (5 mg total) by mouth at bedtime.       Discharge Assessment: Vitals:   12/19/15 2136 12/20/15 0604  BP: (!) 147/91 138/74  Pulse: (!) 56 (!) 56  Resp: 17 18  Temp: 98.4 F (36.9 C) 97.7 F (36.5 C)   Skin clean, dry and intact without evidence of skin break down, no evidence of skin tears noted. IV catheter discontinued intact. Site without signs and symptoms of complications - no redness or edema noted at insertion site, patient denies c/o pain - only slight tenderness at site.  Dressing with slight pressure applied.  D/c Instructions-Education: Discharge instructions given to patient/family with verbalized understanding. D/c education completed with patient/family including follow up instructions, medication list, d/c activities limitations if indicated, with other d/c instructions as indicated by MD - patient able to verbalize understanding, all questions fully answered. Patient instructed to return to ED, call 911, or call MD for any changes in condition.  Patient escorted via [place], and D/C home via private auto.  Salley Slaughter, RN 12/20/2015 1:19 PM

## 2015-12-20 NOTE — Discharge Summary (Signed)
DISCHARGE SUMMARY  [name]  MR#: [id number]  DOB:1931-01-04  Date of Admission: 12/14/2015 Date of Discharge: 12/20/2015  Attending Physician:Lakeysha Slutsky T  Patient's VT:[id number] Christopher Dunnings, MD  Consults:  none  Disposition: D/C home   Follow-up Appts: Follow-up Information    Elsie Stain, MD Follow up in 1 week(s).   Specialty:  Family Medicine Contact information: [place] [id number] [phone]           Tests Needing Follow-up: -monitor R ureteral calculous if pt develops sx  -monitor renal fxn   Discharge Diagnoses: Severe Sepsis due to Proteus mirabilis UTI w/ bacteremia  Acute kidney failure 38mm distal R urteral calculus w/ mild R hydro Solid mass in lower pole R kidney  Chronically bed-bound following L hip fx [id number] Chronic diastolic CHF  Lewy body dementia  Hypokalemia Hypothyroidism BPH HTN  Initial presentation: 82 y.o.male w/ a hx of dementia, stroke, BPH, HTN, hypothyroidism, left femur fracture s/p ORIF Sept 2016 (currently bedbound - unable to tolerate PT/OT) who developed shivering. He was seen by his PCP and found to be hypotensive.  He was sent to the ED where his BP was 70/50. Code Sepsis was initiated and patient was given IV fluid bolus. Lactic acid was 3.68.  Hospital Course:  Severe Sepsis due to Proteus mirabilis UTI w/ bacteremia  Lactic acid peaked at 10.6 - hemodynamically stable - transitioned to oral antibiotic to complete a 14 day course  Acute kidney failure Creatinine has normalized - suspect primarily ATN due hypotension   23mm distal R urteral calculus w/ mild R hydro Renal function not affected - will need follow-up as an outpatient if becomes symptomatic - renal fxn will need to be followed   Solid mass in lower pole R kidney  Slightly larger than 2012 - suggestive of "slow growing renal cell CA" - review of records indicate the pt has been evaluated by Urology previously for this  issue   Chronically bed-bound following L hip fx 2016 his wife reports that she is able to handle him at home with equipment and some assistance  Chronic diastolic CHF  No evidence of volume overload at this time - clinically stable on exam Filed Weights   12/18/15 0645 12/19/15 0300 12/20/15 0604  Weight: 69.4 kg (153 lb) 68.9 kg (152 lb) 63 kg (139 lb)    Lewy body dementia  Appears to be at his baseline mental status at time of d/c   Hypokalemia Supplement daily for the next 3 days - should improve as typical home diet resumes   Hypothyroidism Cont home medical tx  BPH  HTN BP reasonably controlled at time of d/c     Medication List    TAKE these medications   amLODipine 10 MG tablet Commonly known as:  NORVASC Take 0.5 tablets (5 mg total) by mouth daily.   B-complex with vitamin C tablet Take 1 tablet by mouth daily.   cefUROXime 500 MG tablet Commonly known as:  CEFTIN Take 1 tablet (500 mg total) by mouth 2 (two) times daily with a meal.   clopidogrel 75 MG tablet Commonly known as:  PLAVIX Take 1 tablet (75 mg total) by mouth daily.   docusate sodium 100 MG capsule Commonly known as:  COLACE Take 1 capsule (100 mg total) by mouth 2 (two) times daily.   donepezil 10 MG tablet Commonly known as:  ARICEPT Take 1 tablet (10 mg total) by mouth daily. What changed:  when to take  this   escitalopram 5 MG tablet Commonly known as:  LEXAPRO Take 1 tablet (5 mg total) by mouth daily.   IRON PO Take 1 tablet by mouth 2 (two) times daily.   levothyroxine 100 MCG tablet Commonly known as:  SYNTHROID, LEVOTHROID TAKE 100 MCG BY MOUTH DAILY   multivitamin tablet Take 1 tablet by mouth daily.   ondansetron 4 MG tablet Commonly known as:  ZOFRAN Take 1 tablet (4 mg total) by mouth every 6 (six) hours as needed for nausea.   pantoprazole 40 MG tablet Commonly known as:  PROTONIX Take 1 tablet (40 mg total) by mouth daily.   polyethylene glycol  packet Commonly known as:  MIRALAX / GLYCOLAX Take 17 g by mouth daily. What changed:  when to take this  reasons to take this   potassium chloride SA 20 MEQ tablet Commonly known as:  K-DUR,KLOR-CON Take 2 tablets (40 mEq total) by mouth daily. Start taking on:  12/21/2015   PROBIOTIC PO Take 1 tablet by mouth daily.   terazosin 5 MG capsule Commonly known as:  HYTRIN Take 1 capsule (5 mg total) by mouth at bedtime.       Day of Discharge BP 138/74 (BP Location: Right Arm)   Pulse (!) 56   Temp 97.7 F (36.5 C) (Oral)   Resp 18   Ht 5\' 8"  (1.727 m)   Wt 63 kg (139 lb)   SpO2 96%   BMI 21.13 kg/m   Physical Exam: General: No acute respiratory distress Lungs: Clear to auscultation bilaterally without wheezes or crackles Cardiovascular: Regular rate and rhythm without murmur gallop or rub normal S1 and S2 Abdomen: Nontender, nondistended, soft, bowel sounds positive, no rebound, no ascites, no appreciable mass Extremities: No significant cyanosis, clubbing, or edema bilateral lower extremities  Basic Metabolic Panel:  Recent Labs Lab 12/15/15 0132 12/16/15 0217 12/17/15 0805 12/19/15 0519 12/20/15 0637  NA 140 137 139 142 140  K 3.4* 4.0 3.2* 3.7 3.0*  CL 109 112* 114* 116* 110  CO2 21* 18* 19* 23 25  GLUCOSE 103* 92 90 92 94  BUN 29* 38* 32* 14 8  CREATININE 1.50* 2.11* 1.55* 1.02 0.88  CALCIUM 8.1* 7.3* 7.4* 8.0* 8.0*  MG  --   --  1.6* 1.9  --     Liver Function Tests:  Recent Labs Lab 12/14/15 1545 12/15/15 0132 12/16/15 0217 12/17/15 0805  AST 38 30 26 16   ALT 21 18 17  14*  ALKPHOS 79 65 74 56  BILITOT 0.5 0.7 0.8 0.7  PROT 6.5 5.0* 4.8* 4.5*  ALBUMIN 3.4* 2.7* 2.3* 2.1*    CBC:  Recent Labs Lab 12/14/15 1545 12/15/15 0132 12/16/15 0217 12/17/15 0805 12/19/15 0519 12/20/15 0637  WBC 37.5* 28.9* 36.5* 15.4* 10.8* 11.2*  NEUTROABS 34.4*  --   --   --   --   --   HGB 13.2 11.7* 12.2* 11.9* 11.9* 11.9*  HCT 40.6 35.9* 37.0*  36.3* 36.3* 35.4*  MCV 89.0 88.0 87.3 86.6 86.6 85.3  PLT 248 162 136* 122* 137* 166    CBG:  Recent Labs Lab 12/15/15 1542  GLUCAP 106*    Recent Results (from the past 240 hour(s))  Blood Culture (routine x 2)     Status: Abnormal   Collection Time: 12/14/15  1:45 PM  Result Value Ref Range Status   Specimen Description BLOOD RIGHT FOREARM  Final   Special Requests BOTTLES DRAWN AEROBIC AND ANAEROBIC 5CC  Final  Culture  Setup Time   Final    GRAM NEGATIVE RODS AEROBIC BOTTLE ONLY CRITICAL RESULT CALLED TO, READ BACK BY AND VERIFIED WITH: N. BATCHLEDER, PHARMD AT 1500 ON 12/15/15 BY C. JESSUP, MLT.    Culture PROTEUS MIRABILIS (A)  Final   Report Status 12/17/2015 FINAL  Final   Organism ID, Bacteria PROTEUS MIRABILIS  Final      Susceptibility   Proteus mirabilis - MIC*    AMPICILLIN <=2 SENSITIVE Sensitive     CEFAZOLIN <=4 SENSITIVE Sensitive     CEFEPIME <=1 SENSITIVE Sensitive     CEFTAZIDIME <=1 SENSITIVE Sensitive     CEFTRIAXONE <=1 SENSITIVE Sensitive     CIPROFLOXACIN <=0.25 SENSITIVE Sensitive     GENTAMICIN <=1 SENSITIVE Sensitive     IMIPENEM 2 SENSITIVE Sensitive     TRIMETH/SULFA <=20 SENSITIVE Sensitive     AMPICILLIN/SULBACTAM <=2 SENSITIVE Sensitive     PIP/TAZO <=4 SENSITIVE Sensitive     * PROTEUS MIRABILIS  Blood Culture ID Panel (Reflexed)     Status: Abnormal   Collection Time: 12/14/15  1:45 PM  Result Value Ref Range Status   Enterococcus species NOT DETECTED NOT DETECTED Final   Listeria monocytogenes NOT DETECTED NOT DETECTED Final   Staphylococcus species NOT DETECTED NOT DETECTED Final   Staphylococcus aureus NOT DETECTED NOT DETECTED Final   Streptococcus species NOT DETECTED NOT DETECTED Final   Streptococcus agalactiae NOT DETECTED NOT DETECTED Final   Streptococcus pneumoniae NOT DETECTED NOT DETECTED Final   Streptococcus pyogenes NOT DETECTED NOT DETECTED Final   Acinetobacter baumannii NOT DETECTED NOT DETECTED Final    Enterobacteriaceae species DETECTED (A) NOT DETECTED Final    Comment: CRITICAL RESULT CALLED TO, READ BACK BY AND VERIFIED WITH: N. BATCHLEDER, PHARMD AT 1500 ON 12/15/15 BY C. JESSUP, MLT.    Enterobacter cloacae complex NOT DETECTED NOT DETECTED Final   Escherichia coli NOT DETECTED NOT DETECTED Final   Klebsiella oxytoca NOT DETECTED NOT DETECTED Final   Klebsiella pneumoniae NOT DETECTED NOT DETECTED Final   Proteus species DETECTED (A) NOT DETECTED Final    Comment: CRITICAL RESULT CALLED TO, READ BACK BY AND VERIFIED WITH: N. BATCHLEDER, PHARMD AT 1500 ON 12/15/15 BY C. JESSUP, MLT.    Serratia marcescens NOT DETECTED NOT DETECTED Final   Carbapenem resistance NOT DETECTED NOT DETECTED Final   Haemophilus influenzae NOT DETECTED NOT DETECTED Final   Neisseria meningitidis NOT DETECTED NOT DETECTED Final   Pseudomonas aeruginosa NOT DETECTED NOT DETECTED Final   Candida albicans NOT DETECTED NOT DETECTED Final   Candida glabrata NOT DETECTED NOT DETECTED Final   Candida krusei NOT DETECTED NOT DETECTED Final   Candida parapsilosis NOT DETECTED NOT DETECTED Final   Candida tropicalis NOT DETECTED NOT DETECTED Final  Blood Culture (routine x 2)     Status: None   Collection Time: 12/14/15  3:50 PM  Result Value Ref Range Status   Specimen Description BLOOD LEFT FOREARM  Final   Special Requests BOTTLES DRAWN AEROBIC AND ANAEROBIC 5CC  Final   Culture NO GROWTH 5 DAYS  Final   Report Status 12/19/2015 FINAL  Final  Urine culture     Status: Abnormal   Collection Time: 12/14/15  4:25 PM  Result Value Ref Range Status   Specimen Description URINE, RANDOM  Final   Special Requests NONE  Final   Culture >=100,000 COLONIES/mL PROTEUS MIRABILIS (A)  Final   Report Status 12/16/2015 FINAL  Final   Organism ID, Bacteria PROTEUS  MIRABILIS (A)  Final      Susceptibility   Proteus mirabilis - MIC*    AMPICILLIN <=2 SENSITIVE Sensitive     CEFAZOLIN <=4 SENSITIVE Sensitive      CEFTRIAXONE <=1 SENSITIVE Sensitive     CIPROFLOXACIN <=0.25 SENSITIVE Sensitive     GENTAMICIN <=1 SENSITIVE Sensitive     IMIPENEM 2 SENSITIVE Sensitive     NITROFURANTOIN 128 RESISTANT Resistant     TRIMETH/SULFA <=20 SENSITIVE Sensitive     AMPICILLIN/SULBACTAM <=2 SENSITIVE Sensitive     PIP/TAZO <=4 SENSITIVE Sensitive     * >=100,000 COLONIES/mL PROTEUS MIRABILIS  MRSA PCR Screening     Status: None   Collection Time: 12/15/15  4:49 AM  Result Value Ref Range Status   MRSA by PCR NEGATIVE NEGATIVE Final    Comment:        The GeneXpert MRSA Assay (FDA approved for NASAL specimens only), is one component of a comprehensive MRSA colonization surveillance program. It is not intended to diagnose MRSA infection nor to guide or monitor treatment for MRSA infections.      Time spent in discharge (includes decision making & examination of pt): >30 minutes  12/20/2015, 11:41 AM   Cherene Altes, MD [facility] Office  [phone] Pager [phone]  On-Call/Text Page:      [email or web address]      password [facility]

## 2015-12-21 ENCOUNTER — Telehealth: Payer: Self-pay

## 2015-12-21 NOTE — Telephone Encounter (Signed)
Transition Care Management Follow-up Telephone Call    Date discharged? 12/18/2015  How have you been since you were released from the hospital? [place].   Any patient concerns? None at this time    Items Reviewed:  Medications reviewed: Yes  Allergies reviewed: Yes  Dietary changes reviewed: No  Referrals reviewed: Yes   Functional Questionnaire:  Independent - I Dependent - D    Activities of Daily Living (ADLs):    Personal hygiene - D Dressing - D Eating - D Maintaining continence - D pt wears brief and uses bedside commode when sons are available to help with transfer Transferring - D (pt is bedbound)  Independent Activities of Daily Living (iADLs): Basic communication skills - I Transportation - D Meal preparation  - D Shopping - D Housework - D  Managing medications - D Managing personal finances - D   Confirmed importance and date/time of follow-up visits scheduled YES  Provider Appointment booked with PCP 12/28/15 @ 1515  Confirmed with patient if condition begins to worsen call PCP or go to the ER.  Patient was given the office number and encouraged to call back with question or concerns: YES

## 2015-12-22 DIAGNOSIS — K5900 Constipation, unspecified: Secondary | ICD-10-CM | POA: Diagnosis not present

## 2015-12-22 DIAGNOSIS — F0390 Unspecified dementia without behavioral disturbance: Secondary | ICD-10-CM | POA: Diagnosis not present

## 2015-12-22 DIAGNOSIS — Z4789 Encounter for other orthopedic aftercare: Secondary | ICD-10-CM | POA: Diagnosis not present

## 2015-12-22 DIAGNOSIS — I10 Essential (primary) hypertension: Secondary | ICD-10-CM | POA: Diagnosis not present

## 2015-12-22 DIAGNOSIS — K219 Gastro-esophageal reflux disease without esophagitis: Secondary | ICD-10-CM | POA: Diagnosis not present

## 2015-12-22 DIAGNOSIS — J188 Other pneumonia, unspecified organism: Secondary | ICD-10-CM | POA: Diagnosis not present

## 2015-12-22 DIAGNOSIS — D62 Acute posthemorrhagic anemia: Secondary | ICD-10-CM | POA: Diagnosis not present

## 2015-12-22 DIAGNOSIS — J9691 Respiratory failure, unspecified with hypoxia: Secondary | ICD-10-CM | POA: Diagnosis not present

## 2015-12-22 DIAGNOSIS — S72145D Nondisplaced intertrochanteric fracture of left femur, subsequent encounter for closed fracture with routine healing: Secondary | ICD-10-CM | POA: Diagnosis not present

## 2015-12-22 DIAGNOSIS — Z9181 History of falling: Secondary | ICD-10-CM | POA: Diagnosis not present

## 2015-12-22 DIAGNOSIS — E890 Postprocedural hypothyroidism: Secondary | ICD-10-CM | POA: Diagnosis not present

## 2015-12-28 ENCOUNTER — Ambulatory Visit (INDEPENDENT_AMBULATORY_CARE_PROVIDER_SITE_OTHER): Payer: PPO | Admitting: Family Medicine

## 2015-12-28 ENCOUNTER — Encounter: Payer: Self-pay | Admitting: Family Medicine

## 2015-12-28 VITALS — BP 112/76 | HR 70 | Temp 98.5°F

## 2015-12-28 DIAGNOSIS — R748 Abnormal levels of other serum enzymes: Secondary | ICD-10-CM

## 2015-12-28 DIAGNOSIS — Z8619 Personal history of other infectious and parasitic diseases: Secondary | ICD-10-CM

## 2015-12-28 DIAGNOSIS — R7989 Other specified abnormal findings of blood chemistry: Secondary | ICD-10-CM

## 2015-12-28 NOTE — Progress Notes (Signed)
Pre visit review using our clinic review tool, if applicable. No additional management support is needed unless otherwise documented below in the visit note. 

## 2015-12-28 NOTE — Progress Notes (Signed)
Date of Admission: 12/14/2015 Date of Discharge: 12/20/2015  Attending Physician:MCCLUNG,JEFFREY T  Patient's FT:[id number] Damita Dunnings, MD  Consults:  none  Disposition: D/C home   Tests Needing Follow-up: -monitor R ureteral calculous if pt develops sx  -monitor renal fxn   Discharge Diagnoses: Severe Sepsis due to Proteus mirabilis UTI w/ bacteremia  Acute kidney failure 35mm distal R urteral calculus w/ mild R hydro Solid mass in lower pole R kidney  Chronically bed-bound following L hip fx [id number] Chronic diastolic CHF  Lewy body dementia Hypokalemia Hypothyroidism BPH HTN  Initial presentation: 82 y.o.male w/ a hx of dementia, stroke, BPH, HTN, hypothyroidism, left femur fracture s/p ORIF Sept 2016 (currently bedbound - unable to tolerate PT/OT) who developed shivering. He was seen by his PCP and found to be hypotensive. He was sent to the ED where his BP was 70/50. Code Sepsis was initiated and patient was given IV fluid bolus. Lactic acid was 3.68.  Hospital Course:  Severe Sepsis due to Proteus mirabilis UTI w/ bacteremia  Lactic acid peaked at 10.6 - hemodynamically stable - transitionedto oral antibiotic to complete a 14 day course  Acute kidney failure Creatinine has normalized- suspect primarily ATN due hypotension   63mm distal R urteral calculus w/ mild R hydro Renal function not affected- will need follow-up as an outpatient if becomes symptomatic - renal fxn will need to be followed   Solid mass in lower pole R kidney  Slightly larger than 2012 - suggestive of "slow growing renal cell CA" - review of records indicate the pt has been evaluated by Urology previously for this issue   Chronically bed-bound following L hip fx 2016 his wife reports that she is able to handle him at home with equipment and some assistance  Chronic diastolic CHF  No evidence of volume overload at this time - clinically stable on exam      Filed Weights   12/18/15  0645 12/19/15 0300 12/20/15 0604  Weight: 69.4 kg (153 lb) 68.9 kg (152 lb) 63 kg (139 lb)    Lewy body dementia Appears to be at his baseline mental status at time of d/c   Hypokalemia Supplement daily for the next 3 days - should improve as typical home diet resumes   Hypothyroidism Cont home medical tx  BPH  HTN BP reasonably controlled at time of d/c  ------------------------------------------  Hospital fu.  The above d/w pt and wife.   Admitted with sepsis. Culture data reviewed. Treated with with wife. She is caring for him at home. Still deconditioned, still not weightbearing on left leg. This is at baseline. Appetite is gradually improving. No vomiting no diarrhea. No abdominal pain, no dysuria, not short of breath. No fevers. Done with antibiotics.   History of low potassium noted. He is off K now. Repeat labs pending.  Renal mass noted on imaging. This was previously worked up. Discussed with patient and wife today. Likely not worth intervention at this point, given his other issues.  History of elevated creatinine. Recheck labs pending.  PMH and SH reviewed  ROS: Per HPI unless specifically indicated in ROS section   Meds, vitals, and allergies reviewed.   GEN: nad, alert and pleasant in conversation HEENT: mucous membranes moist NECK: supple w/o LA CV: rrr PULM: ctab, no inc wob ABD: soft, +bs EXT: no edema SKIN: no acute rash In wheelchair.

## 2015-12-28 NOTE — Patient Instructions (Signed)
Don't change your meds for now.  We'll contact you with your lab report. Take care.  Glad to see you.  

## 2015-12-29 DIAGNOSIS — Z8619 Personal history of other infectious and parasitic diseases: Secondary | ICD-10-CM | POA: Insufficient documentation

## 2015-12-29 LAB — BASIC METABOLIC PANEL
BUN: 13 mg/dL (ref 6–23)
CALCIUM: 8.8 mg/dL (ref 8.4–10.5)
CO2: 30 meq/L (ref 19–32)
Chloride: 104 mEq/L (ref 96–112)
Creatinine, Ser: 0.9 mg/dL (ref 0.40–1.50)
GFR: 85.63 mL/min (ref >60.00)
GLUCOSE: 91 mg/dL (ref 70–99)
Potassium: 4.2 mEq/L (ref 3.5–5.1)
SODIUM: 140 meq/L (ref 135–145)

## 2015-12-29 NOTE — Assessment & Plan Note (Signed)
Clinically resolved. Off antibiotics. Appetite is improving. No dysuria. No cough. No fevers. Recheck labs pending. Continue current medications.  History of low potassium. Repeat labs pending. See notes on labs.  History of elevated creatinine. Repeat labs pending. See notes on labs.  Still relatively deconditioned. Wife is caring for him at home. Nonweightbearing on left leg at baseline.  History of renal mass, known. At this point likely not worth intervention. Discussed with patient and wife. Both agree.

## 2016-01-17 DIAGNOSIS — Z9181 History of falling: Secondary | ICD-10-CM | POA: Diagnosis not present

## 2016-01-17 DIAGNOSIS — R269 Unspecified abnormalities of gait and mobility: Secondary | ICD-10-CM | POA: Diagnosis not present

## 2016-01-17 DIAGNOSIS — F0390 Unspecified dementia without behavioral disturbance: Secondary | ICD-10-CM | POA: Diagnosis not present

## 2016-01-17 DIAGNOSIS — E890 Postprocedural hypothyroidism: Secondary | ICD-10-CM | POA: Diagnosis not present

## 2016-01-17 DIAGNOSIS — Z4789 Encounter for other orthopedic aftercare: Secondary | ICD-10-CM | POA: Diagnosis not present

## 2016-01-17 DIAGNOSIS — J9691 Respiratory failure, unspecified with hypoxia: Secondary | ICD-10-CM | POA: Diagnosis not present

## 2016-01-17 DIAGNOSIS — I10 Essential (primary) hypertension: Secondary | ICD-10-CM | POA: Diagnosis not present

## 2016-01-17 DIAGNOSIS — D62 Acute posthemorrhagic anemia: Secondary | ICD-10-CM | POA: Diagnosis not present

## 2016-01-17 DIAGNOSIS — S72145D Nondisplaced intertrochanteric fracture of left femur, subsequent encounter for closed fracture with routine healing: Secondary | ICD-10-CM | POA: Diagnosis not present

## 2016-01-17 DIAGNOSIS — J188 Other pneumonia, unspecified organism: Secondary | ICD-10-CM | POA: Diagnosis not present

## 2016-01-17 DIAGNOSIS — K5900 Constipation, unspecified: Secondary | ICD-10-CM | POA: Diagnosis not present

## 2016-01-17 DIAGNOSIS — K219 Gastro-esophageal reflux disease without esophagitis: Secondary | ICD-10-CM | POA: Diagnosis not present

## 2016-01-21 DIAGNOSIS — K219 Gastro-esophageal reflux disease without esophagitis: Secondary | ICD-10-CM | POA: Diagnosis not present

## 2016-01-21 DIAGNOSIS — F0390 Unspecified dementia without behavioral disturbance: Secondary | ICD-10-CM | POA: Diagnosis not present

## 2016-01-21 DIAGNOSIS — D62 Acute posthemorrhagic anemia: Secondary | ICD-10-CM | POA: Diagnosis not present

## 2016-01-21 DIAGNOSIS — J188 Other pneumonia, unspecified organism: Secondary | ICD-10-CM | POA: Diagnosis not present

## 2016-01-21 DIAGNOSIS — K5900 Constipation, unspecified: Secondary | ICD-10-CM | POA: Diagnosis not present

## 2016-01-21 DIAGNOSIS — J9691 Respiratory failure, unspecified with hypoxia: Secondary | ICD-10-CM | POA: Diagnosis not present

## 2016-01-21 DIAGNOSIS — Z9181 History of falling: Secondary | ICD-10-CM | POA: Diagnosis not present

## 2016-01-21 DIAGNOSIS — E890 Postprocedural hypothyroidism: Secondary | ICD-10-CM | POA: Diagnosis not present

## 2016-01-21 DIAGNOSIS — I10 Essential (primary) hypertension: Secondary | ICD-10-CM | POA: Diagnosis not present

## 2016-01-21 DIAGNOSIS — Z4789 Encounter for other orthopedic aftercare: Secondary | ICD-10-CM | POA: Diagnosis not present

## 2016-01-21 DIAGNOSIS — S72145D Nondisplaced intertrochanteric fracture of left femur, subsequent encounter for closed fracture with routine healing: Secondary | ICD-10-CM | POA: Diagnosis not present

## 2016-02-09 ENCOUNTER — Other Ambulatory Visit: Payer: Self-pay | Admitting: Family Medicine

## 2016-02-11 ENCOUNTER — Ambulatory Visit (INDEPENDENT_AMBULATORY_CARE_PROVIDER_SITE_OTHER): Payer: PPO | Admitting: Family Medicine

## 2016-02-11 ENCOUNTER — Encounter: Payer: Self-pay | Admitting: Family Medicine

## 2016-02-11 VITALS — BP 122/80 | HR 68 | Temp 97.4°F

## 2016-02-11 DIAGNOSIS — N3090 Cystitis, unspecified without hematuria: Secondary | ICD-10-CM | POA: Diagnosis not present

## 2016-02-11 DIAGNOSIS — R310 Gross hematuria: Secondary | ICD-10-CM | POA: Diagnosis not present

## 2016-02-11 LAB — POC URINALSYSI DIPSTICK (AUTOMATED)
BILIRUBIN UA: NEGATIVE
Glucose, UA: NEGATIVE
Ketones, UA: NEGATIVE
NITRITE UA: POSITIVE
PH UA: 5.5
Spec Grav, UA: >=1.03
UROBILINOGEN UA: 1

## 2016-02-11 MED ORDER — CEPHALEXIN 500 MG PO CAPS: 500.0000 mg | ORAL_CAPSULE | Freq: Three times a day (TID) | ORAL | 0 refills | Status: DC

## 2016-02-11 NOTE — Progress Notes (Signed)
Subjective:    Patient ID: [name], male    DOB: 1931/03/23, 82 y.o.   MRN: [id number]  HPI This is an 82 yo male who presents today with gross hematuria that started this morning. He is accompanied by his wife and care giver. He has not complained of any pain. No recent vomiting, diarrhea or fever. Good appetite (ate 2 eggs and 2 pieces of toast), slept well last night. He denies back pain, dysuria, nausea. Slept well last night. No cough.   Past Medical History:  Diagnosis Date  . BPH (benign prostatic hypertrophy)   . Dementia   . Diverticulosis of colon   . History of shingles   . Hypertension   . Hypothyroidism   . Multiple fractures   . Pancreatitis 2011   and gallstone pancreatitis and Ecoli bacteremia  . Pneumonia    2016  . Rabbit fever 1940  . Renal mass 2011   Eval by [name] echogenic mass on u/s. followed by [name]- observation as of 09/2010  . Stroke ([place]) 02/2015   with L sided weakness.    Past Surgical History:  Procedure Laterality Date  . APPENDECTOMY  1959  . CHOLECYSTECTOMY  10/2009  . EYE MUSCLE SURGERY     12/12 had double vision Dr. Juleen China at [facility]  . JOINT REPLACEMENT  04/30/2001   bilateral TKR  . KNEE ARTHROSCOPY  06/1995   left Dr Derrel Nip  . OPEN REDUCTION INTERNAL FIXATION (ORIF) DISTAL RADIAL FRACTURE Left 03/16/2015   Procedure: OPEN REDUCTION INTERNAL FIXATION (ORIF) DISTAL RADIAL FRACTURE;  Surgeon: Iran Planas, MD;  Location: [facility];  Service: Orthopedics;  Laterality: Left;  . ORIF FEMUR FRACTURE Left 03/16/2015   Procedure: OPEN REDUCTION INTERNAL FIXATION (ORIF) DISTAL FEMUR FRACTURE;  Surgeon: Paralee Cancel, MD;  Location: [facility];  Service: Orthopedics;  Laterality: Left;  . ORIF HIP FRACTURE Left 12/26/2014   Procedure: OPEN REDUCTION INTERNAL FIXATION HIP;  Surgeon: Paralee Cancel, MD;  Location: [facility];  Service: Orthopedics;  Laterality: Left;  . THYROIDECTOMY, PARTIAL  1966   Family History  Problem Relation Age of Onset  .  Hypertension Mother   . Stroke Mother   . Alcohol abuse Father   . Cancer Brother     prostate CA  . Heart disease Brother    Social History  Substance Use Topics  . Smoking status: Never Smoker  . Smokeless tobacco: Never Used  . Alcohol use No      Review of Systems Per HPI    Objective:   Physical Exam  Constitutional: He appears well-developed and well-nourished.  Sitting in wheelchair, NAD, thin. Answers questions.   HENT:  Head: Normocephalic and atraumatic.  Eyes: Conjunctivae are normal.  Cardiovascular: Normal rate, regular rhythm and normal heart sounds.   Pulmonary/Chest: Effort normal and breath sounds normal.  Abdominal: He exhibits no distension. There is no tenderness. There is no rebound and no guarding.  Musculoskeletal: He exhibits no edema.  Neurological: He is alert.  Skin: Skin is warm and dry.  Psychiatric: He has a normal mood and affect.  Vitals reviewed.     BP 122/80 (BP Location: Left Arm, Patient Position: Sitting, Cuff Size: Small)   Pulse 68   Temp 97.4 F (36.3 C) (Oral)   SpO2 95%  Wt Readings from Last 3 Encounters:  12/20/15 139 lb (63 kg)  04/03/15 150 lb (68 kg)  02/09/15 154 lb 8.7 oz (70.1 kg)   Results for orders placed or  performed in visit on 02/11/16  POCT Urinalysis Dipstick (Automated)  Result Value Ref Range   Color, UA Brown    Clarity, UA turbid    Glucose, UA negative    Bilirubin, UA negative    Ketones, UA negative    Spec Grav, UA >=1.030    Blood, UA 3+    pH, UA 5.5    Protein, UA 2+    Urobilinogen, UA 1.0    Nitrite, UA positive    Leukocytes, UA large (3+) (A) Negative       Assessment & Plan:  1. Gross hematuria - cephALEXin (KEFLEX) 500 MG capsule; Take 1 capsule (500 mg total) by mouth 3 (three) times daily.  Dispense: 21 capsule; Refill: 0 - Urine culture - POCT urinalysis dipstick  2. Cystitis - Provided written and verbal information regarding diagnosis and treatment. - RTC  precautions reviewed- fever, vomiting, pain - cephALEXin (KEFLEX) 500 MG capsule; Take 1 capsule (500 mg total) by mouth 3 (three) times daily.  Dispense: 21 capsule; Refill: 0 - Urine culture   Clarene Reamer, FNP-BC  [facility]  02/11/2016 11:59 AM

## 2016-02-11 NOTE — Patient Instructions (Signed)
Continue good fluid intake Please let us know if he develops a fever, pain, trouble breathing or vomiting  Urinary Tract Infection Urinary tract infections (UTIs) can develop anywhere along your urinary tract. Your urinary tract is your body's drainage system for removing wastes and extra water. Your urinary tract includes two kidneys, two ureters, a bladder, and a urethra. Your kidneys are a pair of bean-shaped organs. Each kidney is about the size of your fist. They are located below your ribs, one on each side of your spine. CAUSES Infections are caused by microbes, which are microscopic organisms, including fungi, viruses, and bacteria. These organisms are so small that they can only be seen through a microscope. Bacteria are the microbes that most commonly cause UTIs. SYMPTOMS  Symptoms of UTIs Raschke vary by age and gender of the patient and by the location of the infection. Symptoms in young women typically include a frequent and intense urge to urinate and a painful, burning feeling in the bladder or urethra during urination. Older women and men are more likely to be tired, shaky, and weak and have muscle aches and abdominal pain. A fever Knack mean the infection is in your kidneys. Other symptoms of a kidney infection include pain in your back or sides below the ribs, nausea, and vomiting. DIAGNOSIS To diagnose a UTI, your caregiver will ask you about your symptoms. Your caregiver will also ask you to provide a urine sample. The urine sample will be tested for bacteria and white blood cells. White blood cells are made by your body to help fight infection. TREATMENT  Typically, UTIs can be treated with medication. Because most UTIs are caused by a bacterial infection, they usually can be treated with the use of antibiotics. The choice of antibiotic and length of treatment depend on your symptoms and the type of bacteria causing your infection. HOME CARE INSTRUCTIONS  If you were prescribed  antibiotics, take them exactly as your caregiver instructs you. Finish the medication even if you feel better after you have only taken some of the medication.  Drink enough water and fluids to keep your urine clear or pale yellow.  Avoid caffeine, tea, and carbonated beverages. They tend to irritate your bladder.  Empty your bladder often. Avoid holding urine for long periods of time.  Empty your bladder before and after sexual intercourse.  After a bowel movement, women should cleanse from front to back. Use each tissue only once. SEEK MEDICAL CARE IF:   You have back pain.  You develop a fever.  Your symptoms do not begin to resolve within 3 days. SEEK IMMEDIATE MEDICAL CARE IF:   You have severe back pain or lower abdominal pain.  You develop chills.  You have nausea or vomiting.  You have continued burning or discomfort with urination. MAKE SURE YOU:   Understand these instructions.  Will watch your condition.  Will get help right away if you are not doing well or get worse.   This information is not intended to replace advice given to you by your health care provider. Make sure you discuss any questions you have with your health care provider.   Document Released: 12/29/2004 Document Revised: 12/10/2014 Document Reviewed: 04/29/2011 Elsevier Interactive Patient Education [employer].

## 2016-02-11 NOTE — Progress Notes (Signed)
Pre visit review using our clinic review tool, if applicable. No additional management support is needed unless otherwise documented below in the visit note. 

## 2016-02-12 ENCOUNTER — Ambulatory Visit: Payer: PPO | Admitting: Family Medicine

## 2016-02-14 LAB — URINE CULTURE

## 2016-02-15 ENCOUNTER — Other Ambulatory Visit: Payer: Self-pay | Admitting: Family Medicine

## 2016-02-15 MED ORDER — CIPROFLOXACIN HCL 250 MG PO TABS: 250.0000 mg | ORAL_TABLET | Freq: Two times a day (BID) | ORAL | 0 refills | Status: DC

## 2016-02-17 DIAGNOSIS — J188 Other pneumonia, unspecified organism: Secondary | ICD-10-CM | POA: Diagnosis not present

## 2016-02-17 DIAGNOSIS — Z4789 Encounter for other orthopedic aftercare: Secondary | ICD-10-CM | POA: Diagnosis not present

## 2016-02-17 DIAGNOSIS — J9691 Respiratory failure, unspecified with hypoxia: Secondary | ICD-10-CM | POA: Diagnosis not present

## 2016-02-17 DIAGNOSIS — D62 Acute posthemorrhagic anemia: Secondary | ICD-10-CM | POA: Diagnosis not present

## 2016-02-17 DIAGNOSIS — K219 Gastro-esophageal reflux disease without esophagitis: Secondary | ICD-10-CM | POA: Diagnosis not present

## 2016-02-17 DIAGNOSIS — E890 Postprocedural hypothyroidism: Secondary | ICD-10-CM | POA: Diagnosis not present

## 2016-02-17 DIAGNOSIS — S72145D Nondisplaced intertrochanteric fracture of left femur, subsequent encounter for closed fracture with routine healing: Secondary | ICD-10-CM | POA: Diagnosis not present

## 2016-02-17 DIAGNOSIS — F0390 Unspecified dementia without behavioral disturbance: Secondary | ICD-10-CM | POA: Diagnosis not present

## 2016-02-17 DIAGNOSIS — K5900 Constipation, unspecified: Secondary | ICD-10-CM | POA: Diagnosis not present

## 2016-02-17 DIAGNOSIS — I10 Essential (primary) hypertension: Secondary | ICD-10-CM | POA: Diagnosis not present

## 2016-02-17 DIAGNOSIS — R269 Unspecified abnormalities of gait and mobility: Secondary | ICD-10-CM | POA: Diagnosis not present

## 2016-02-17 DIAGNOSIS — Z9181 History of falling: Secondary | ICD-10-CM | POA: Diagnosis not present

## 2016-02-21 DIAGNOSIS — D62 Acute posthemorrhagic anemia: Secondary | ICD-10-CM | POA: Diagnosis not present

## 2016-02-21 DIAGNOSIS — Z9181 History of falling: Secondary | ICD-10-CM | POA: Diagnosis not present

## 2016-02-21 DIAGNOSIS — J188 Other pneumonia, unspecified organism: Secondary | ICD-10-CM | POA: Diagnosis not present

## 2016-02-21 DIAGNOSIS — S72145D Nondisplaced intertrochanteric fracture of left femur, subsequent encounter for closed fracture with routine healing: Secondary | ICD-10-CM | POA: Diagnosis not present

## 2016-02-21 DIAGNOSIS — J9691 Respiratory failure, unspecified with hypoxia: Secondary | ICD-10-CM | POA: Diagnosis not present

## 2016-02-21 DIAGNOSIS — K5900 Constipation, unspecified: Secondary | ICD-10-CM | POA: Diagnosis not present

## 2016-02-21 DIAGNOSIS — K219 Gastro-esophageal reflux disease without esophagitis: Secondary | ICD-10-CM | POA: Diagnosis not present

## 2016-02-21 DIAGNOSIS — E890 Postprocedural hypothyroidism: Secondary | ICD-10-CM | POA: Diagnosis not present

## 2016-02-21 DIAGNOSIS — F0390 Unspecified dementia without behavioral disturbance: Secondary | ICD-10-CM | POA: Diagnosis not present

## 2016-02-21 DIAGNOSIS — I10 Essential (primary) hypertension: Secondary | ICD-10-CM | POA: Diagnosis not present

## 2016-02-21 DIAGNOSIS — Z4789 Encounter for other orthopedic aftercare: Secondary | ICD-10-CM | POA: Diagnosis not present

## 2016-03-17 DIAGNOSIS — Z0289 Encounter for other administrative examinations: Secondary | ICD-10-CM

## 2016-03-18 DIAGNOSIS — J9691 Respiratory failure, unspecified with hypoxia: Secondary | ICD-10-CM | POA: Diagnosis not present

## 2016-03-18 DIAGNOSIS — S72145D Nondisplaced intertrochanteric fracture of left femur, subsequent encounter for closed fracture with routine healing: Secondary | ICD-10-CM | POA: Diagnosis not present

## 2016-03-18 DIAGNOSIS — K5900 Constipation, unspecified: Secondary | ICD-10-CM | POA: Diagnosis not present

## 2016-03-18 DIAGNOSIS — K219 Gastro-esophageal reflux disease without esophagitis: Secondary | ICD-10-CM | POA: Diagnosis not present

## 2016-03-18 DIAGNOSIS — J188 Other pneumonia, unspecified organism: Secondary | ICD-10-CM | POA: Diagnosis not present

## 2016-03-18 DIAGNOSIS — Z9181 History of falling: Secondary | ICD-10-CM | POA: Diagnosis not present

## 2016-03-18 DIAGNOSIS — Z4789 Encounter for other orthopedic aftercare: Secondary | ICD-10-CM | POA: Diagnosis not present

## 2016-03-18 DIAGNOSIS — D62 Acute posthemorrhagic anemia: Secondary | ICD-10-CM | POA: Diagnosis not present

## 2016-03-18 DIAGNOSIS — R269 Unspecified abnormalities of gait and mobility: Secondary | ICD-10-CM | POA: Diagnosis not present

## 2016-03-18 DIAGNOSIS — E890 Postprocedural hypothyroidism: Secondary | ICD-10-CM | POA: Diagnosis not present

## 2016-03-18 DIAGNOSIS — F0390 Unspecified dementia without behavioral disturbance: Secondary | ICD-10-CM | POA: Diagnosis not present

## 2016-03-18 DIAGNOSIS — I10 Essential (primary) hypertension: Secondary | ICD-10-CM | POA: Diagnosis not present

## 2016-05-23 ENCOUNTER — Telehealth: Payer: Self-pay | Admitting: Family Medicine

## 2016-05-23 MED ORDER — ESCITALOPRAM OXALATE 5 MG PO TABS: 5.0000 mg | ORAL_TABLET | Freq: Every day | ORAL | 1 refills | Status: DC

## 2016-05-23 MED ORDER — CLOPIDOGREL BISULFATE 75 MG PO TABS: 75.0000 mg | ORAL_TABLET | Freq: Every day | ORAL | 1 refills | Status: DC

## 2016-05-23 MED ORDER — DONEPEZIL HCL 10 MG PO TABS: 10.0000 mg | ORAL_TABLET | Freq: Every day | ORAL | 1 refills | Status: DC

## 2016-05-23 MED ORDER — TERAZOSIN HCL 5 MG PO CAPS: 5.0000 mg | ORAL_CAPSULE | Freq: Every day | ORAL | 1 refills | Status: DC

## 2016-05-23 MED ORDER — LEVOTHYROXINE SODIUM 100 MCG PO TABS: ORAL_TABLET | ORAL | 1 refills | Status: DC

## 2016-05-23 NOTE — Telephone Encounter (Signed)
pts wife called regarding several medications that need refilled.  They werent able to get them refilled by contacting [facility] directly.  She asked that we call her to get the names of the prescriptions.

## 2016-05-23 NOTE — Telephone Encounter (Addendum)
Spoke to patient's wife and was advised that when she calls their mail order pharmacy she has to stay on hold with them forever and then they tell her to call the doctor's office for refills.[name] that usually the mail order pharmacy will either fax or send refill request electronically to the provider. [name] stated that she is very unhappy with her mail order family. Advised her she should call and talk with customer service about her complains. [name] requested that Dr. Damita Dunnings send the following refills in for her husband.  . Patient's wife stated to forget about it. I advised her that we will sent them in, but she should let them know how unhappy she is with their service.  Refills sent electronically to [facility] for Plavix, Aricept, Levothyroxine Lexapro and Hytrin per [name]'s request. Patient's wife advised by telephone that these have been sent in to [facility] for her.

## 2016-05-23 NOTE — Telephone Encounter (Signed)
Please see what you can find out on this.  Thanks.

## 2016-05-24 NOTE — Telephone Encounter (Signed)
Agreed, thanks

## 2016-06-06 ENCOUNTER — Telehealth: Payer: Self-pay | Admitting: *Deleted

## 2016-06-06 NOTE — Telephone Encounter (Signed)
Drug interaction form in your IN box for completion.

## 2016-06-06 NOTE — Telephone Encounter (Signed)
Form done. Patient has been on both Lexapro and donepezil concurrently for an extended period of time. At this point would continue both, given his ongoing medical issues. Thanks.

## 2016-06-07 NOTE — Telephone Encounter (Signed)
Form faxed as requested.

## 2016-06-27 ENCOUNTER — Telehealth: Payer: Self-pay | Admitting: Family Medicine

## 2016-06-27 NOTE — Telephone Encounter (Signed)
Pt has appt with [name] on 06/28/16 at 10:30.

## 2016-06-27 NOTE — Telephone Encounter (Signed)
Patient Name: [name] DOB: 10-Feb-1931 Initial Comment Caller states husband is vomiting. Nurse Assessment Nurse: Angeline Slim, RN, [place] Date/Time (Eastern Time): 06/27/2016 10:39:56 AM Confirm and document reason for call. If symptomatic, describe symptoms. ---Caller states husband is vomiting. HE has had episodes of vomiting for 2 years now. Had one yesterday morning and thought it was same thing. Then had another one about 8pm last night then started again this morning Does the patient have any new or worsening symptoms? ---Yes Will a triage be completed? ---Yes Related visit to physician within the last 2 weeks? ---No Does the PT have any chronic conditions? (i.e. diabetes, asthma, etc.) ---Yes List chronic conditions. ---bedridden Is this a behavioral health or substance abuse call? ---No Guidelines Guideline Title Affirmed Question Affirmed Notes Vomiting [1] MILD or MODERATE vomiting AND [2] present > 48 hours (2 days) (Exception: mild vomiting with associated diarrhea) Final Disposition User See Physician within 24 Hours Zellers, RN, [place] Comments Appt scheduled for tomorrow at 10:30 with Dr. Damita Dunnings Referrals REFERRED TO PCP OFFICE REFERRED TO PCP OFFICE Disagree/Comply: Comply

## 2016-06-28 ENCOUNTER — Encounter: Payer: Self-pay | Admitting: Family Medicine

## 2016-06-28 ENCOUNTER — Ambulatory Visit (INDEPENDENT_AMBULATORY_CARE_PROVIDER_SITE_OTHER): Payer: PPO | Admitting: Family Medicine

## 2016-06-28 VITALS — BP 133/93 | HR 72 | Temp 98.3°F

## 2016-06-28 DIAGNOSIS — R55 Syncope and collapse: Secondary | ICD-10-CM | POA: Diagnosis not present

## 2016-06-28 DIAGNOSIS — R1110 Vomiting, unspecified: Secondary | ICD-10-CM

## 2016-06-28 LAB — CBC WITH DIFFERENTIAL/PLATELET
BASOS ABS: 0 10*3/uL (ref 0.0–0.1)
Basophils Relative: 0.5 % (ref 0.0–3.0)
EOS ABS: 0.2 10*3/uL (ref 0.0–0.7)
Eosinophils Relative: 2 % (ref 0.0–5.0)
HCT: 41.2 % (ref 39.0–52.0)
Hemoglobin: 13.8 g/dL (ref 13.0–17.0)
LYMPHS PCT: 12.7 % (ref 12.0–46.0)
Lymphs Abs: 1.1 10*3/uL (ref 0.7–4.0)
MCHC: 33.4 g/dL (ref 30.0–36.0)
MCV: 89.7 fl (ref 78.0–100.0)
MONO ABS: 0.7 10*3/uL (ref 0.1–1.0)
Monocytes Relative: 8.1 % (ref 3.0–12.0)
NEUTROS ABS: 6.6 10*3/uL (ref 1.4–7.7)
NEUTROS PCT: 76.7 % (ref 43.0–77.0)
PLATELETS: 246 10*3/uL (ref 150.0–400.0)
RBC: 4.59 Mil/uL (ref 4.22–5.81)
RDW: 14.1 % (ref 11.5–15.5)
WBC: 8.6 10*3/uL (ref 4.0–10.5)

## 2016-06-28 LAB — COMPREHENSIVE METABOLIC PANEL
ALBUMIN: 4 g/dL (ref 3.5–5.2)
ALT: 11 U/L (ref 0–53)
AST: 12 U/L (ref 0–37)
Alkaline Phosphatase: 69 U/L (ref 39–117)
BILIRUBIN TOTAL: 0.6 mg/dL (ref 0.2–1.2)
BUN: 28 mg/dL — ABNORMAL HIGH (ref 6–23)
CALCIUM: 9.5 mg/dL (ref 8.4–10.5)
CO2: 30 mEq/L (ref 19–32)
CREATININE: 1.12 mg/dL (ref 0.40–1.50)
Chloride: 107 mEq/L (ref 96–112)
GFR: 66.45 mL/min (ref >60.00)
Glucose, Bld: 124 mg/dL — ABNORMAL HIGH (ref 70–99)
Potassium: 3.4 mEq/L — ABNORMAL LOW (ref 3.5–5.1)
Sodium: 144 mEq/L (ref 135–145)
TOTAL PROTEIN: 6.8 g/dL (ref 6.0–8.3)

## 2016-06-28 LAB — LIPASE: LIPASE: 17 U/L (ref 11.0–59.0)

## 2016-06-28 NOTE — Progress Notes (Signed)
Pre visit review using our clinic review tool, if applicable. No additional management support is needed unless otherwise documented below in the visit note. 

## 2016-06-28 NOTE — Progress Notes (Signed)
He can't stand on his own.  He had a BM at at the time he went limp with a low BP, looked like an episode of syncope per family report.  This was during a BM.  It has happened prev.  Family helped him get back to bed, didn't fall.  Low BP noted, then recovered to normal BP in a few minutes.  No SZ activity.    He hasn't had syncope recently before this, until this past week. Still on terazosin at baseline.  No other new meds.    Vomiting was a separate issue, started about 2 days after the syncope.  Once he was able to vomit, he felt better.  He would usually have a vomiting episodes then none for a few weeks, but had 3 episodes of vomiting in 2 days recently.    BP was 117/73 yesterday, when he didn't have any ongoing sx.    His wife had external help coming in, and that has been beneficial.    PMH and SH reviewed  ROS: Per HPI unless specifically indicated in ROS section   Meds, vitals, and allergies reviewed.   In wheelchair.   Not speaking much at the time of exam but looks alert, is still able to smile and answer short questions, follow commands.  Mmm Neck supple no LA rrr ctab Abd soft, not ttp Ext w/o edema Chronically but not acutely ill appearing

## 2016-06-28 NOTE — Patient Instructions (Addendum)
Stop the terazosin in the meantime.  Let me talk to Dr. Cristina Gong in the meantime.  Go to the lab on the way out.  We'll contact you with your lab report. Take care.  Glad to see you.

## 2016-06-29 DIAGNOSIS — R55 Syncope and collapse: Secondary | ICD-10-CM | POA: Insufficient documentation

## 2016-06-29 NOTE — Assessment & Plan Note (Signed)
Unclear if dysautonomia is causing both the vomiting and the tendency for lower BP esp with BM.  I will check routine labs today and be in contact with GI.  Rationale d/w pt's wife.

## 2016-06-29 NOTE — Assessment & Plan Note (Addendum)
With BM, [place] have been a vagal issue, d/w pt.  Would avoid invasive testing with other comorbid illnesses noted.  Stop remaining antihypertensive agents, dw wife.  She agrees. >25 minutes spent in face to face time with patient, >50% spent in counselling or coordination of care.   Addendum- talked with Dr. Osborn Coho CMA, he is out of office, will await call back.  App help of all involved.

## 2016-06-30 ENCOUNTER — Telehealth: Payer: Self-pay | Admitting: Family Medicine

## 2016-06-30 NOTE — Telephone Encounter (Signed)
Left message on answering machine at home number to call back. 

## 2016-06-30 NOTE — Telephone Encounter (Signed)
Patient's wife notified as instructed by telephone and verbalized understanding. 

## 2016-06-30 NOTE — Telephone Encounter (Signed)
I talked with Dr. Cristina Gong- he didn't recommend a specific intervention other than stop the BP med as we did, with continued observation.  Would continue as planned; please have her update me next week.  Thanks.

## 2016-07-13 ENCOUNTER — Emergency Department (HOSPITAL_COMMUNITY): Payer: PPO

## 2016-07-13 ENCOUNTER — Encounter (HOSPITAL_COMMUNITY): Payer: Self-pay | Admitting: Emergency Medicine

## 2016-07-13 ENCOUNTER — Telehealth: Payer: Self-pay

## 2016-07-13 ENCOUNTER — Emergency Department (HOSPITAL_COMMUNITY)
Admission: EM | Admit: 2016-07-13 | Discharge: 2016-07-13 | Disposition: A | Discharge status: Home / Self Care | Payer: PPO | Attending: Emergency Medicine | Admitting: Emergency Medicine

## 2016-07-13 DIAGNOSIS — Z7902 Long term (current) use of antithrombotics/antiplatelets: Secondary | ICD-10-CM | POA: Insufficient documentation

## 2016-07-13 DIAGNOSIS — R1110 Vomiting, unspecified: Secondary | ICD-10-CM | POA: Diagnosis not present

## 2016-07-13 DIAGNOSIS — E039 Hypothyroidism, unspecified: Secondary | ICD-10-CM | POA: Diagnosis not present

## 2016-07-13 DIAGNOSIS — I5032 Chronic diastolic (congestive) heart failure: Secondary | ICD-10-CM | POA: Diagnosis not present

## 2016-07-13 DIAGNOSIS — Z85528 Personal history of other malignant neoplasm of kidney: Secondary | ICD-10-CM | POA: Diagnosis not present

## 2016-07-13 DIAGNOSIS — Z8673 Personal history of transient ischemic attack (TIA), and cerebral infarction without residual deficits: Secondary | ICD-10-CM | POA: Insufficient documentation

## 2016-07-13 DIAGNOSIS — I110 Hypertensive heart disease with heart failure: Secondary | ICD-10-CM | POA: Insufficient documentation

## 2016-07-13 DIAGNOSIS — I10 Essential (primary) hypertension: Secondary | ICD-10-CM | POA: Diagnosis not present

## 2016-07-13 DIAGNOSIS — R509 Fever, unspecified: Secondary | ICD-10-CM | POA: Diagnosis not present

## 2016-07-13 DIAGNOSIS — Z96653 Presence of artificial knee joint, bilateral: Secondary | ICD-10-CM | POA: Insufficient documentation

## 2016-07-13 DIAGNOSIS — Z79899 Other long term (current) drug therapy: Secondary | ICD-10-CM | POA: Diagnosis not present

## 2016-07-13 DIAGNOSIS — R404 Transient alteration of awareness: Secondary | ICD-10-CM | POA: Diagnosis not present

## 2016-07-13 DIAGNOSIS — R1111 Vomiting without nausea: Secondary | ICD-10-CM | POA: Diagnosis not present

## 2016-07-13 LAB — URINALYSIS, ROUTINE W REFLEX MICROSCOPIC
BILIRUBIN URINE: NEGATIVE
GLUCOSE, UA: NEGATIVE mg/dL
HGB URINE DIPSTICK: NEGATIVE
Ketones, ur: NEGATIVE mg/dL
NITRITE: NEGATIVE
Protein, ur: NEGATIVE mg/dL
SPECIFIC GRAVITY, URINE: 1.013 (ref 1.005–1.030)
Squamous Epithelial / LPF: NONE SEEN
pH: 6 (ref 5.0–8.0)

## 2016-07-13 LAB — COMPREHENSIVE METABOLIC PANEL
ALT: 13 U/L — ABNORMAL LOW (ref 17–63)
AST: 51 U/L — AB (ref 15–41)
Albumin: 4.1 g/dL (ref 3.5–5.0)
Alkaline Phosphatase: 75 U/L (ref 38–126)
Anion gap: 8 (ref 5–15)
BILIRUBIN TOTAL: 1.5 mg/dL — AB (ref 0.3–1.2)
BUN: 25 mg/dL — AB (ref 6–20)
CHLORIDE: 104 mmol/L (ref 101–111)
CO2: 28 mmol/L (ref 22–32)
Calcium: 8.9 mg/dL (ref 8.9–10.3)
Creatinine, Ser: 1.23 mg/dL (ref 0.61–1.24)
GFR, EST NON AFRICAN AMERICAN: 52 mL/min — AB (ref >60)
Glucose, Bld: 114 mg/dL — ABNORMAL HIGH (ref 65–99)
POTASSIUM: 5.1 mmol/L (ref 3.5–5.1)
Sodium: 140 mmol/L (ref 135–145)
TOTAL PROTEIN: 7.7 g/dL (ref 6.5–8.1)

## 2016-07-13 LAB — CBC
HEMATOCRIT: 46.6 % (ref 39.0–52.0)
Hemoglobin: 15.5 g/dL (ref 13.0–17.0)
MCH: 29.9 pg (ref 26.0–34.0)
MCHC: 33.3 g/dL (ref 30.0–36.0)
MCV: 89.8 fL (ref 78.0–100.0)
PLATELETS: 228 10*3/uL (ref 150–400)
RBC: 5.19 MIL/uL (ref 4.22–5.81)
RDW: 13.9 % (ref 11.5–15.5)
WBC: 6.8 10*3/uL (ref 4.0–10.5)

## 2016-07-13 LAB — CBG MONITORING, ED: Glucose-Capillary: 115 mg/dL — ABNORMAL HIGH (ref 65–99)

## 2016-07-13 LAB — LIPASE, BLOOD: LIPASE: 17 U/L (ref 11–51)

## 2016-07-13 MED ORDER — ONDANSETRON HCL 4 MG PO TABS: 4.0000 mg | ORAL_TABLET | Freq: Three times a day (TID) | ORAL | 0 refills | Status: AC | PRN

## 2016-07-13 MED ORDER — SODIUM CHLORIDE 0.9 % IV BOLUS (SEPSIS)
1000.0000 mL | Freq: Once | INTRAVENOUS | Status: AC
  Administered 2016-07-13: 1000 mL via INTRAVENOUS

## 2016-07-13 NOTE — Telephone Encounter (Signed)
Per chart review tab pt at [facility] ED.

## 2016-07-13 NOTE — ED Notes (Signed)
PT  Wifestates understanding of care given, follow up care, and medication prescribed. PT was taken in the steady from ED to car.

## 2016-07-13 NOTE — Telephone Encounter (Signed)
PLEASE NOTE: All timestamps contained within this report are represented as [place] Standard Time. CONFIDENTIALTY NOTICE: This fax transmission is intended only for the addressee. It contains information that is legally privileged, confidential or otherwise protected from use or disclosure. If you are not the intended recipient, you are strictly prohibited from reviewing, disclosing, copying using or disseminating any of this information or taking any action in reliance on or regarding this information. If you have received this fax in error, please notify us immediately by telephone so that we can arrange for its return to [place]. Phone: [phone], Toll-Free: [phone], Fax: [phone] Page: 1 of 1 Call Id: [id number] [facility] - Client Nonclinical Telephone Record [place] - Client Client Site [facility] - Day Contact Type Call Who Is Calling Patient / Member / Family / Caregiver Caller Name Aleen Campi Phone Number [phone] Patient Name [name] Call Type Message Only Information Provided Reason for Call Request to Schedule Office Appointment [facility] Reason Other Initial Comment Caller is needing to schedule an appt. Call Closed By: Merrilee Seashore Transaction Date/Time: 07/13/2016 2:58:01 PM (ET)

## 2016-07-13 NOTE — ED Notes (Signed)
Condom cath applied to catch UA

## 2016-07-13 NOTE — ED Notes (Signed)
Bed: ML46 Expected date:  Expected time:  Means of arrival:  Comments: EMS/possible uti

## 2016-07-13 NOTE — ED Notes (Signed)
Pt was able to hold food down with out having any nausea

## 2016-07-13 NOTE — ED Notes (Signed)
Patient transported to X-ray 

## 2016-07-13 NOTE — ED Notes (Signed)
Pt back in room.

## 2016-07-13 NOTE — ED Provider Notes (Signed)
[facility] DEPT Provider Note   CSN: [id number] Arrival date & time: 07/13/16  1339     History   Chief Complaint Chief Complaint  Patient presents with  . Emesis  . Altered Mental Status    HPI [name] is a 83 y.o. male.   Emesis   This is a new problem. The current episode started yesterday. The problem occurs 2 to 4 times per day. The problem has been gradually improving. The emesis has an appearance of stomach contents. There has been no fever. Pertinent negatives include no abdominal pain and no chills.    Past Medical History:  Diagnosis Date  . BPH (benign prostatic hypertrophy)   . Dementia   . Diverticulosis of colon   . History of shingles   . Hypertension   . Hypothyroidism   . Multiple fractures   . Pancreatitis 2011   and gallstone pancreatitis and Ecoli bacteremia  . Pneumonia    2016  . Rabbit fever 1940  . Renal mass 2011   Eval by [name] echogenic mass on u/s. followed by [name]- observation as of 09/2010  . Stroke ([place]) 02/2015   with L sided weakness.     Patient Active Problem List   Diagnosis Date Noted  . Syncope 06/29/2016  . History of sepsis 12/29/2015  . Renal cell cancer ([place])   . Dysuria 12/15/2015  . Cough 09/09/2015  . Orthostatic hypotension 04/29/2015  . Dementia 04/29/2015  . Femur fracture, left ([place]) 04/02/2015  . Distal radial fracture 04/02/2015  . Acute blood loss anemia, post operative  03/17/2015  . Leukocytosis 03/16/2015  . Chronic diastolic CHF (congestive heart failure), NYHA class 1 ([place]) 03/16/2015  . Chronic constipation, unspecified  03/16/2015  . Left radial fracture 1Mar 25, 202016  . Recent right MCA stroke ([place]) 02/09/2015  . Essential hypertension 02/09/2015  . Lewy body dementia without behavioral disturbance   . Vomiting 05/11/2014  . Ascending aortic aneurysm ([place]) 07/19/2012  . Barrett's esophagus 09/28/2006    Past Surgical History:  Procedure Laterality Date  . APPENDECTOMY  1959    . CHOLECYSTECTOMY  10/2009  . EYE MUSCLE SURGERY     12/12 had double vision Dr. Juleen [facility] at [facility]  . JOINT REPLACEMENT  04/30/2001   bilateral TKR  . KNEE ARTHROSCOPY  06/1995   left Dr Derrel Nip  . OPEN REDUCTION INTERNAL FIXATION (ORIF) DISTAL RADIAL FRACTURE Left 03/16/2015   Procedure: OPEN REDUCTION INTERNAL FIXATION (ORIF) DISTAL RADIAL FRACTURE;  Surgeon: Iran Planas, MD;  Location: [place] [place];  Service: Orthopedics;  Laterality: Left;  . ORIF FEMUR FRACTURE Left 03/16/2015   Procedure: OPEN REDUCTION INTERNAL FIXATION (ORIF) DISTAL FEMUR FRACTURE;  Surgeon: Paralee Cancel, MD;  Location: [place];  Service: Orthopedics;  Laterality: Left;  . ORIF HIP FRACTURE Left 12/26/2014   Procedure: OPEN REDUCTION INTERNAL FIXATION HIP;  Surgeon: Paralee Cancel, MD;  Location: [facility];  Service: Orthopedics;  Laterality: Left;  . THYROIDECTOMY, PARTIAL  1966       Home Medications    Prior to Admission medications   Medication Sig Start Date End Date Taking? Authorizing Provider  B Complex-C (B-COMPLEX WITH VITAMIN C) tablet Take 1 tablet by mouth daily. 07/13/12  Yes Ripudeep Krystal Eaton, MD  clopidogrel (PLAVIX) 75 MG tablet Take 1 tablet (75 mg total) by mouth daily. Patient taking differently: Take 75 mg by mouth every morning.  05/23/16  Yes Tonia Ghent, MD  docusate sodium (COLACE) 100 MG capsule Take 100 mg by  mouth 2 (two) times daily.   Yes Historical Provider, MD  donepezil (ARICEPT) 10 MG tablet Take 1 tablet (10 mg total) by mouth daily. Patient taking differently: Take 10 mg by mouth at bedtime.  05/23/16  Yes Tonia Ghent, MD  escitalopram (LEXAPRO) 5 MG tablet Take 1 tablet (5 mg total) by mouth daily. Patient taking differently: Take 5 mg by mouth every morning.  05/23/16  Yes Tonia Ghent, MD  IRON PO Take 1 tablet by mouth 2 (two) times daily.    Yes Historical Provider, MD  levothyroxine (SYNTHROID, LEVOTHROID) 100 MCG tablet TAKE 100 MCG BY MOUTH DAILY 05/23/16  Yes Tonia Ghent, MD   Multiple Vitamin (MULTIVITAMIN) tablet Take 1 tablet by mouth daily.     Yes Historical Provider, MD  pantoprazole (PROTONIX) 40 MG tablet Take 1 tablet (40 mg total) by mouth daily. Patient taking differently: Take 40 mg by mouth every morning.  05/04/15  Yes Tonia Ghent, MD  polyethylene glycol [facility] / GLYCOLAX) packet Take 17 g by mouth daily. Patient taking differently: Take 17 g by mouth daily as needed.  12/30/14  Yes Venetia Maxon Rama, MD  Probiotic Product (PROBIOTIC PO) Take 1 tablet by mouth daily.   Yes Historical Provider, MD  ondansetron (ZOFRAN) 4 MG tablet Take 1 tablet (4 mg total) by mouth every 8 (eight) hours as needed for nausea or vomiting. 07/13/16   Merrily Pew, MD    Family History Family History  Problem Relation Age of Onset  . Hypertension Mother   . Stroke Mother   . Alcohol abuse Father   . Cancer Brother     prostate CA  . Heart disease Brother     Social History Social History  Substance Use Topics  . Smoking status: Never Smoker  . Smokeless tobacco: Never Used  . Alcohol use No     Allergies   Sulfa antibiotics and Valtrex [valacyclovir hcl]   Review of Systems Review of Systems  Constitutional: Negative for chills.  Respiratory: Negative for shortness of breath.   Gastrointestinal: Positive for vomiting. Negative for abdominal pain.  All other systems reviewed and are negative.    Physical Exam Updated Vital Signs BP (!) 155/93 (BP Location: Left Arm)   Pulse 75   Temp 97.9 F (36.6 C) (Oral)   Resp 16   SpO2 93%   Physical Exam  Constitutional: He is oriented to person, place, and time. He appears well-developed and well-nourished.  HENT:  Head: Normocephalic and atraumatic.  Eyes: Conjunctivae and EOM are normal.  Neck: Normal range of motion.  Cardiovascular: Normal rate.   Pulmonary/Chest: Effort normal. No respiratory distress. He has no wheezes.  Abdominal: Soft. He exhibits no distension. There is no tenderness.  There is no guarding.  Musculoskeletal: Normal range of motion. He exhibits no edema or deformity.  Neurological: He is alert and oriented to person, place, and time. He displays normal reflexes. No cranial nerve deficit. Coordination normal.  Skin: Skin is warm and dry.  Nursing note and vitals reviewed.    ED Treatments / Results  Labs (all labs ordered are listed, but only abnormal results are displayed) Labs Reviewed  COMPREHENSIVE METABOLIC PANEL - Abnormal; Notable for the following:       Result Value   Glucose, Bld 114 (*)    BUN 25 (*)    AST 51 (*)    ALT 13 (*)    Total Bilirubin 1.5 (*)    GFR  calc non Af Amer 52 (*)    All other components within normal limits  URINALYSIS, ROUTINE W REFLEX MICROSCOPIC - Abnormal; Notable for the following:    Leukocytes, UA SMALL (*)    Bacteria, UA RARE (*)    All other components within normal limits  CBG MONITORING, ED - Abnormal; Notable for the following:    Glucose-Capillary 115 (*)    All other components within normal limits  LIPASE, BLOOD  CBC    EKG  EKG Interpretation None       Radiology Dg Chest 2 View  Result Date: 07/13/2016 CLINICAL DATA:  Vomiting, confusion beginning today, dementia, hypertension, prior stroke, CHF, history renal cell carcinoma EXAM: CHEST  2 VIEW COMPARISON:  12/14/2015 FINDINGS: Normal heart size and pulmonary vascularity. Mildly tortuous thoracic aorta. Atelectasis versus consolidation at LEFT lower lobe. Subsegmental atelectasis RIGHT base. Upper lungs clear. Small bibasilar pleural effusions. No pneumothorax. Bones demineralized with probable BILATERAL chronic rotator cuff tears. IMPRESSION: RIGHT basilar atelectasis with atelectasis versus consolidation in LEFT lower lobe and small BILATERAL pleural effusions. Slightly increased RIGHT basilar atelectasis since prior study. Electronically Signed   By: Lavonia Dana M.D.   On: 07/13/2016 15:41    Procedures Procedures (including critical  care time)  Medications Ordered in ED Medications  sodium chloride 0.9 % bolus 1,000 mL (0 mLs Intravenous Stopped 07/13/16 1539)     Initial Impression / Assessment and Plan / ED Course  I have reviewed the triage vital signs and the nursing notes.  Pertinent labs & imaging results that were available during my care of the patient were reviewed by me and considered in my medical decision making (see chart for details).     Multiple episodes of nbnb emesis. Improved now, appears slightly dehydrated. Will PO challenge/check labs, UA and likely discharge. Abdomen benign, will not image at this time unless labs indicate the necessity.  Patient significantly improved with fluids. Tolerating PO. Family agrees patient is back to baseline and thankful for help. No e/o infection/electrolyte abnormalities or other indications for admission.   Final Clinical Impressions(s) / ED Diagnoses   Final diagnoses:  Vomiting, intractability of vomiting not specified, presence of nausea not specified, unspecified vomiting type     Merrily Pew, MD 07/13/16 2354

## 2016-07-13 NOTE — ED Triage Notes (Signed)
Pt from home via EMS with complaints of emesis and confusion that began today. Pt is bed bound at home, but has home health. Pt's wife is on her way here. Pt has hx of dementia, but per family seems more confused today. Pt is alert and oriented x 3 (all but year). Pt is not febrile at time of assessment. Pt was actively vomiting when EMS arrived, and was given 1057mL NS and 4mg  zofran. Pt has no more complaints of nausea

## 2016-07-13 NOTE — ED Notes (Signed)
Given food to the patient to have fluid and food test to see if he get nauseous

## 2016-08-09 ENCOUNTER — Other Ambulatory Visit: Payer: Self-pay | Admitting: Family Medicine

## 2016-09-01 ENCOUNTER — Other Ambulatory Visit: Payer: Self-pay | Admitting: Family Medicine

## 2016-10-04 ENCOUNTER — Ambulatory Visit (INDEPENDENT_AMBULATORY_CARE_PROVIDER_SITE_OTHER): Payer: PPO | Admitting: Internal Medicine

## 2016-10-04 ENCOUNTER — Telehealth: Payer: Self-pay | Admitting: Family Medicine

## 2016-10-04 ENCOUNTER — Encounter: Payer: Self-pay | Admitting: Internal Medicine

## 2016-10-04 ENCOUNTER — Other Ambulatory Visit (INDEPENDENT_AMBULATORY_CARE_PROVIDER_SITE_OTHER): Payer: PPO

## 2016-10-04 VITALS — BP 110/78 | HR 74 | Ht 68.0 in

## 2016-10-04 DIAGNOSIS — I10 Essential (primary) hypertension: Secondary | ICD-10-CM | POA: Diagnosis not present

## 2016-10-04 DIAGNOSIS — R310 Gross hematuria: Secondary | ICD-10-CM | POA: Diagnosis not present

## 2016-10-04 DIAGNOSIS — C641 Malignant neoplasm of right kidney, except renal pelvis: Secondary | ICD-10-CM

## 2016-10-04 LAB — URINALYSIS, ROUTINE W REFLEX MICROSCOPIC
BILIRUBIN URINE: NEGATIVE
Hgb urine dipstick: NEGATIVE
NITRITE: NEGATIVE
PH: 7.5 (ref 5.0–8.0)
Specific Gravity, Urine: 1.015 (ref 1.000–1.030)
Urine Glucose: NEGATIVE
Urobilinogen, UA: 1 (ref 0.0–1.0)

## 2016-10-04 MED ORDER — CEPHALEXIN 500 MG PO CAPS: 500.0000 mg | ORAL_CAPSULE | Freq: Three times a day (TID) | ORAL | 0 refills | Status: AC

## 2016-10-04 NOTE — Patient Instructions (Addendum)
Please take all new medication as prescribed - the antibiotic in hardcopy  Please continue all other medications as before, and refills have been done if requested.  Please have the pharmacy call with any other refills you [facility] need.  You will be contacted regarding the referral for: Kidney ultrasound  Please keep your appointments with your specialists as you [place] have planned  The specimen was done at the Lab today  If the culture is negative, you will want to follow up with Dr Tannenbaum/urology  You will be contacted by phone if any changes need to be made immediately.  Otherwise, you will receive a letter about your results with an explanation, but please check with MyChart first.  Please remember to sign up for MyChart if you have not done so, as this will be important to you in the future with finding out test results, communicating by private email, and scheduling acute appointments online when needed.

## 2016-10-04 NOTE — Assessment & Plan Note (Signed)
Very mild on plavix, ok to continue for now, UA c/w probable UTI, for empiric antibx course, f/u urine cx,  to f/u any worsening symptoms or concerns

## 2016-10-04 NOTE — Progress Notes (Signed)
Subjective:    Patient ID: [name], male    DOB: [birth year], 83 y.o.   MRN: [id number]  HPI  Here to f/u with wife; pt lives at home with wife, bedridden, and has [facility] care; the home care provider noted gross hematuria small earlier today, and confirmed by wife who observed the urine specimen herself.  Pt himself unable to give hx. Does have hx of UTI.  Does take plavix.  Has had no vomiting, though has had recurrent episodes vomiting for past 2 yrs, usually late at night for some reason, with evaluation neg per GI/Dr Buccini.  Pt also has hx of renal cell ca, small, has seen [name], felt to be stable over last few yrs, and all agreed no aggressive tx was appropriate. Past Medical History:  Diagnosis Date  . BPH (benign prostatic hypertrophy)   . Dementia   . Diverticulosis of colon   . History of shingles   . Hypertension   . Hypothyroidism   . Multiple fractures   . Pancreatitis 2011   and gallstone pancreatitis and Ecoli bacteremia  . Pneumonia    2016  . Rabbit fever 1940  . Renal mass 2011   Eval by [name] echogenic mass on u/s. followed by [name]- observation as of 09/2010  . Stroke ([place]) 02/2015   with L sided weakness.    Past Surgical History:  Procedure Laterality Date  . APPENDECTOMY  1959  . CHOLECYSTECTOMY  10/2009  . EYE MUSCLE SURGERY     12/12 had double vision Dr. Juleen China at [facility]  . JOINT REPLACEMENT  04/30/2001   bilateral TKR  . KNEE ARTHROSCOPY  06/1995   left Dr Derrel Nip  . OPEN REDUCTION INTERNAL FIXATION (ORIF) DISTAL RADIAL FRACTURE Left 03/16/2015   Procedure: OPEN REDUCTION INTERNAL FIXATION (ORIF) DISTAL RADIAL FRACTURE;  Surgeon: Iran Planas, MD;  Location: [facility];  Service: Orthopedics;  Laterality: Left;  . ORIF FEMUR FRACTURE Left 03/16/2015   Procedure: OPEN REDUCTION INTERNAL FIXATION (ORIF) DISTAL FEMUR FRACTURE;  Surgeon: Paralee Cancel, MD;  Location: [facility];  Service: Orthopedics;  Laterality: Left;  . ORIF HIP FRACTURE Left  12/26/2014   Procedure: OPEN REDUCTION INTERNAL FIXATION HIP;  Surgeon: Paralee Cancel, MD;  Location: [facility];  Service: Orthopedics;  Laterality: Left;  . THYROIDECTOMY, PARTIAL  1966    reports that he has never smoked. He has never used smokeless tobacco. He reports that he does not drink alcohol or use drugs. family history includes Alcohol abuse in his father; Cancer in his brother; Heart disease in his brother; Hypertension in his mother; Stroke in his mother. Allergies  Allergen Reactions  . Sulfa Antibiotics Hives  . Valtrex [Valacyclovir Hcl] Nausea And Vomiting    NV   Current Outpatient Prescriptions on File Prior to Visit  Medication Sig Dispense Refill  . B Complex-C (B-COMPLEX WITH VITAMIN C) tablet Take 1 tablet by mouth daily. 30 tablet 3  . clopidogrel (PLAVIX) 75 MG tablet Take 1 tablet by mouth daily 90 tablet 0  . docusate sodium (COLACE) 100 MG capsule Take 100 mg by mouth 2 (two) times daily.    Marland Kitchen donepezil (ARICEPT) 10 MG tablet Take 1 tablet (10 mg total) by mouth daily. (Patient taking differently: Take 10 mg by mouth at bedtime. ) 90 tablet 1  . escitalopram (LEXAPRO) 5 MG tablet Take 1 tablet (5 mg total) by mouth daily. (Patient taking differently: Take 5 mg by mouth every morning. ) 90 tablet 1  .  IRON PO Take 1 tablet by mouth 2 (two) times daily.     Marland Kitchen levothyroxine (SYNTHROID, LEVOTHROID) 100 MCG tablet TAKE 100 MCG BY MOUTH DAILY 90 tablet 1  . Multiple Vitamin (MULTIVITAMIN) tablet Take 1 tablet by mouth daily.      . ondansetron (ZOFRAN) 4 MG tablet Take 1 tablet (4 mg total) by mouth every 8 (eight) hours as needed for nausea or vomiting. 12 tablet 0  . pantoprazole (PROTONIX) 40 MG tablet Take 1 tablet by mouth daily 90 tablet 2  . polyethylene glycol (MIRALAX / GLYCOLAX) packet Take 17 g by mouth daily. (Patient taking differently: Take 17 g by mouth daily as needed. ) 14 each 0  . Probiotic Product (PROBIOTIC PO) Take 1 tablet by mouth daily.     No  current facility-administered medications on file prior to visit.    Review of Systems Pt unable due to dementia    Objective:   Physical Exam BP 110/78   Pulse 74   Ht 5\' 8"  (1.727 m)   SpO2 97%  VS noted, non toxic Constitutional: Pt appears in NAD HENT: Head: NCAT.  Right Ear: External ear normal.  Left Ear: External ear normal.  Eyes: . Pupils are equal, round, and reactive to light. Conjunctivae and EOM are normal Nose: without d/c or deformity Neck: Neck supple. Gross normal ROM Cardiovascular: Normal rate and regular rhythm.   Pulmonary/Chest: Effort normal and breath sounds without rales or wheezing.  Abd:  Soft, NT, ND, + BS, no organomegaly - benign exam, no flank tender evident Neurological: Pt is alert. At baseline orientation c/w severe dementia, motor grossly intact Skin: Skin is warm. No rashes, other new lesions, no LE edema Psychiatric: Pt behavior is normal without agitation  No other exam findings Urinalysis, Routine w reflex microscopic  Order: [id number]  Status:  Final result Visible to patient:  No (Not Released) Next appt:  None Dx:  Gross hematuria    Ref Range & Units 17:31 33mo ago 34mo ago   Color, Urine Yellow;Lt. Yellow YELLOW  YELLOWR     APPearance Clear CLEAR  CLEARR     Specific Gravity, Urine 1.000 - 1.030 1.015  1.013R     pH 5.0 - 8.0 7.5  6.0     Total Protein, Urine Negative TRACE       Urine Glucose Negative NEGATIVE      Ketones, ur Negative TRACE   NEGATIVER     Bilirubin Urine Negative NEGATIVE  NEGATIVER     Hgb urine dipstick Negative NEGATIVE  NEGATIVER     Urobilinogen, UA 0.0 - 1.0 1.0   1.0R    Leukocytes, UA Negative SMALL   SMALLR   large (3+)     Nitrite Negative NEGATIVE  NEGATIVER     WBC, UA 0-2/hpf 21-50/hpf   6-30R     RBC / HPF 0-2/hpf 3-6/hpf   0-5R     Squamous Epithelial / LPF Rare(0-4/hpf) Few(5-10/hpf)   NONE SEENR     Bacteria, UA None Few(10-50/hpf)               Assessment & Plan:

## 2016-10-04 NOTE — Telephone Encounter (Signed)
Patient Name: [name] DOB: Jun 20, 1930 Initial Comment husband has blood in urine Nurse Assessment Nurse: Joya Gaskins, RN, Vonna Kotyk Date/Time Eilene Ghazi Time): 10/04/2016 2:09:21 PM Confirm and document reason for call. If symptomatic, describe symptoms. ---Caller states that her husband has blood in his urine. Home care RN noticed it this afternoon. Does the patient have any new or worsening symptoms? ---Yes Will a triage be completed? ---Yes Related visit to physician within the last 2 weeks? ---No Does the PT have any chronic conditions? (i.e. diabetes, asthma, etc.) ---No Is this a behavioral health or substance abuse call? ---No Guidelines Guideline Title Affirmed Question Affirmed Notes Urine - Blood In Blood in urine (Exception: could be normal menstrual bleeding) Final Disposition User See Physician within 24 Hours Joya Gaskins, RN, Vonna Kotyk Referrals GO TO [facility] - SPECIFY Disagree/Comply: Comply

## 2016-10-04 NOTE — Telephone Encounter (Signed)
Pt has appt with Dr Cathlean Cower on 10/04/16 at 5:15.

## 2016-10-04 NOTE — Assessment & Plan Note (Signed)
For f/u renal u/s but suspect UTI more likely source of gross blood

## 2016-10-04 NOTE — Assessment & Plan Note (Signed)
stable overall by history and exam, recent data reviewed with pt, and pt to continue medical treatment as before,  to f/u any worsening symptoms or concerns BP Readings from Last 3 Encounters:  10/04/16 110/78  07/13/16 (!) 155/93  06/28/16 (!) 133/93

## 2016-10-06 ENCOUNTER — Telehealth: Payer: Self-pay | Admitting: Internal Medicine

## 2016-10-06 LAB — URINE CULTURE

## 2016-10-06 NOTE — Telephone Encounter (Signed)
[facility] called and need an order changed.  The renal ultrasound needs to be done at the Hospital since the Pt is bedridden and cannot transport

## 2016-10-06 NOTE — Telephone Encounter (Signed)
Spoke with pt's wife and informed her of the below info. She will follow up with his PCP.

## 2016-10-06 NOTE — Telephone Encounter (Signed)
shirron to let family know, we can hold off on the renal u/s since it is so difficult to get done due to his condition, and can be addressed at next visit if she likes

## 2016-10-24 ENCOUNTER — Emergency Department (HOSPITAL_COMMUNITY): Payer: PPO

## 2016-10-24 ENCOUNTER — Inpatient Hospital Stay (HOSPITAL_COMMUNITY)
Admission: EM | Admit: 2016-10-24 | Discharge: 2016-10-29 | DRG: 194 | Disposition: A | Discharge status: Home Health | Payer: PPO | Attending: Nephrology | Admitting: Nephrology

## 2016-10-24 ENCOUNTER — Encounter (HOSPITAL_COMMUNITY): Payer: Self-pay | Admitting: *Deleted

## 2016-10-24 DIAGNOSIS — J181 Lobar pneumonia, unspecified organism: Principal | ICD-10-CM | POA: Diagnosis present

## 2016-10-24 DIAGNOSIS — E890 Postprocedural hypothyroidism: Secondary | ICD-10-CM | POA: Diagnosis present

## 2016-10-24 DIAGNOSIS — N390 Urinary tract infection, site not specified: Secondary | ICD-10-CM | POA: Diagnosis not present

## 2016-10-24 DIAGNOSIS — N200 Calculus of kidney: Secondary | ICD-10-CM | POA: Diagnosis not present

## 2016-10-24 DIAGNOSIS — F0390 Unspecified dementia without behavioral disturbance: Secondary | ICD-10-CM | POA: Diagnosis present

## 2016-10-24 DIAGNOSIS — R0789 Other chest pain: Secondary | ICD-10-CM

## 2016-10-24 DIAGNOSIS — K22719 Barrett's esophagus with dysplasia, unspecified: Secondary | ICD-10-CM

## 2016-10-24 DIAGNOSIS — K210 Gastro-esophageal reflux disease with esophagitis: Secondary | ICD-10-CM | POA: Diagnosis present

## 2016-10-24 DIAGNOSIS — I10 Essential (primary) hypertension: Secondary | ICD-10-CM | POA: Diagnosis present

## 2016-10-24 DIAGNOSIS — R7881 Bacteremia: Secondary | ICD-10-CM | POA: Diagnosis not present

## 2016-10-24 DIAGNOSIS — E039 Hypothyroidism, unspecified: Secondary | ICD-10-CM | POA: Diagnosis present

## 2016-10-24 DIAGNOSIS — I69354 Hemiplegia and hemiparesis following cerebral infarction affecting left non-dominant side: Secondary | ICD-10-CM | POA: Diagnosis not present

## 2016-10-24 DIAGNOSIS — Z883 Allergy status to other anti-infective agents status: Secondary | ICD-10-CM

## 2016-10-24 DIAGNOSIS — Z7902 Long term (current) use of antithrombotics/antiplatelets: Secondary | ICD-10-CM | POA: Diagnosis not present

## 2016-10-24 DIAGNOSIS — I5032 Chronic diastolic (congestive) heart failure: Secondary | ICD-10-CM | POA: Diagnosis not present

## 2016-10-24 DIAGNOSIS — Z7401 Bed confinement status: Secondary | ICD-10-CM | POA: Diagnosis not present

## 2016-10-24 DIAGNOSIS — F329 Major depressive disorder, single episode, unspecified: Secondary | ICD-10-CM | POA: Diagnosis present

## 2016-10-24 DIAGNOSIS — K2270 Barrett's esophagus without dysplasia: Secondary | ICD-10-CM | POA: Diagnosis present

## 2016-10-24 DIAGNOSIS — R079 Chest pain, unspecified: Secondary | ICD-10-CM

## 2016-10-24 DIAGNOSIS — B965 Pseudomonas (aeruginosa) (mallei) (pseudomallei) as the cause of diseases classified elsewhere: Secondary | ICD-10-CM | POA: Diagnosis present

## 2016-10-24 DIAGNOSIS — N179 Acute kidney failure, unspecified: Secondary | ICD-10-CM | POA: Diagnosis not present

## 2016-10-24 DIAGNOSIS — R64 Cachexia: Secondary | ICD-10-CM | POA: Diagnosis not present

## 2016-10-24 DIAGNOSIS — J189 Pneumonia, unspecified organism: Secondary | ICD-10-CM | POA: Diagnosis not present

## 2016-10-24 DIAGNOSIS — Z681 Body mass index (BMI) 19 or less, adult: Secondary | ICD-10-CM

## 2016-10-24 DIAGNOSIS — R310 Gross hematuria: Secondary | ICD-10-CM | POA: Diagnosis not present

## 2016-10-24 DIAGNOSIS — Z8673 Personal history of transient ischemic attack (TIA), and cerebral infarction without residual deficits: Secondary | ICD-10-CM

## 2016-10-24 DIAGNOSIS — E871 Hypo-osmolality and hyponatremia: Secondary | ICD-10-CM | POA: Diagnosis not present

## 2016-10-24 DIAGNOSIS — N400 Enlarged prostate without lower urinary tract symptoms: Secondary | ICD-10-CM | POA: Diagnosis present

## 2016-10-24 DIAGNOSIS — Z8249 Family history of ischemic heart disease and other diseases of the circulatory system: Secondary | ICD-10-CM

## 2016-10-24 DIAGNOSIS — I712 Thoracic aortic aneurysm, without rupture, unspecified: Secondary | ICD-10-CM

## 2016-10-24 DIAGNOSIS — E876 Hypokalemia: Secondary | ICD-10-CM | POA: Diagnosis not present

## 2016-10-24 DIAGNOSIS — D49511 Neoplasm of unspecified behavior of right kidney: Secondary | ICD-10-CM

## 2016-10-24 DIAGNOSIS — Z811 Family history of alcohol abuse and dependence: Secondary | ICD-10-CM

## 2016-10-24 DIAGNOSIS — N2889 Other specified disorders of kidney and ureter: Secondary | ICD-10-CM | POA: Diagnosis not present

## 2016-10-24 DIAGNOSIS — K5730 Diverticulosis of large intestine without perforation or abscess without bleeding: Secondary | ICD-10-CM | POA: Diagnosis not present

## 2016-10-24 DIAGNOSIS — I110 Hypertensive heart disease with heart failure: Secondary | ICD-10-CM | POA: Diagnosis not present

## 2016-10-24 DIAGNOSIS — J151 Pneumonia due to Pseudomonas: Secondary | ICD-10-CM | POA: Diagnosis not present

## 2016-10-24 DIAGNOSIS — Z9049 Acquired absence of other specified parts of digestive tract: Secondary | ICD-10-CM

## 2016-10-24 DIAGNOSIS — N1330 Unspecified hydronephrosis: Secondary | ICD-10-CM | POA: Diagnosis not present

## 2016-10-24 DIAGNOSIS — R404 Transient alteration of awareness: Secondary | ICD-10-CM | POA: Diagnosis not present

## 2016-10-24 DIAGNOSIS — Z8042 Family history of malignant neoplasm of prostate: Secondary | ICD-10-CM

## 2016-10-24 DIAGNOSIS — Z882 Allergy status to sulfonamides status: Secondary | ICD-10-CM

## 2016-10-24 DIAGNOSIS — Z823 Family history of stroke: Secondary | ICD-10-CM

## 2016-10-24 DIAGNOSIS — R531 Weakness: Secondary | ICD-10-CM | POA: Diagnosis not present

## 2016-10-24 DIAGNOSIS — Z96653 Presence of artificial knee joint, bilateral: Secondary | ICD-10-CM | POA: Diagnosis present

## 2016-10-24 DIAGNOSIS — Z66 Do not resuscitate: Secondary | ICD-10-CM | POA: Diagnosis present

## 2016-10-24 DIAGNOSIS — R319 Hematuria, unspecified: Secondary | ICD-10-CM | POA: Diagnosis not present

## 2016-10-24 LAB — URINALYSIS, ROUTINE W REFLEX MICROSCOPIC
Bacteria, UA: NONE SEEN
Squamous Epithelial / LPF: NONE SEEN

## 2016-10-24 LAB — D-DIMER, QUANTITATIVE: D-Dimer, Quant: 2.1 ug/mL-FEU — ABNORMAL HIGH (ref 0.00–0.50)

## 2016-10-24 LAB — CBC
HEMATOCRIT: 41.2 % (ref 39.0–52.0)
Hemoglobin: 13.5 g/dL (ref 13.0–17.0)
MCH: 29.1 pg (ref 26.0–34.0)
MCHC: 32.8 g/dL (ref 30.0–36.0)
MCV: 88.8 fL (ref 78.0–100.0)
Platelets: 261 10*3/uL (ref 150–400)
RBC: 4.64 MIL/uL (ref 4.22–5.81)
RDW: 15.1 % (ref 11.5–15.5)
WBC: 13.9 10*3/uL — AB (ref 4.0–10.5)

## 2016-10-24 LAB — TROPONIN I: Troponin I: <0.03 ng/mL (ref <0.03)

## 2016-10-24 LAB — BASIC METABOLIC PANEL
ANION GAP: 8 (ref 5–15)
BUN: 24 mg/dL — ABNORMAL HIGH (ref 6–20)
CHLORIDE: 107 mmol/L (ref 101–111)
CO2: 27 mmol/L (ref 22–32)
Calcium: 8.9 mg/dL (ref 8.9–10.3)
Creatinine, Ser: 1.11 mg/dL (ref 0.61–1.24)
GFR calc Af Amer: >60 mL/min (ref >60)
GFR, EST NON AFRICAN AMERICAN: 59 mL/min — AB (ref >60)
Glucose, Bld: 117 mg/dL — ABNORMAL HIGH (ref 65–99)
POTASSIUM: 4.2 mmol/L (ref 3.5–5.1)
SODIUM: 142 mmol/L (ref 135–145)

## 2016-10-24 LAB — I-STAT TROPONIN, ED
Troponin i, poc: 0 ng/mL (ref 0.00–0.08)
Troponin i, poc: 0.03 ng/mL (ref 0.00–0.08)

## 2016-10-24 LAB — STREP PNEUMONIAE URINARY ANTIGEN: Strep Pneumo Urinary Antigen: NEGATIVE

## 2016-10-24 LAB — MRSA PCR SCREENING: MRSA by PCR: POSITIVE — AB

## 2016-10-24 MED ORDER — DEXTROSE 5 % IV SOLN
1.0000 g | INTRAVENOUS | Status: DC
  Filled 2016-10-24: qty 10

## 2016-10-24 MED ORDER — AZITHROMYCIN 500 MG PO TABS
500.0000 mg | ORAL_TABLET | ORAL | Status: DC
  Administered 2016-10-25: 500 mg via ORAL
  Filled 2016-10-24 (×2): qty 1

## 2016-10-24 MED ORDER — ONDANSETRON HCL 4 MG/2ML IJ SOLN
4.0000 mg | Freq: Once | INTRAMUSCULAR | Status: AC
Start: 2016-10-24 — End: 2016-10-24
  Administered 2016-10-24: 4 mg via INTRAVENOUS
  Filled 2016-10-24: qty 2

## 2016-10-24 MED ORDER — SACCHAROMYCES BOULARDII 250 MG PO CAPS
250.0000 mg | ORAL_CAPSULE | Freq: Every day | ORAL | Status: DC
  Administered 2016-10-24 – 2016-10-29 (×5): 250 mg via ORAL
  Filled 2016-10-24 (×6): qty 1

## 2016-10-24 MED ORDER — LEVOTHYROXINE SODIUM 100 MCG PO TABS
100.0000 ug | ORAL_TABLET | Freq: Every day | ORAL | Status: DC
  Administered 2016-10-24 – 2016-10-29 (×5): 100 ug via ORAL
  Filled 2016-10-24 (×5): qty 1

## 2016-10-24 MED ORDER — AZITHROMYCIN 250 MG PO TABS
500.0000 mg | ORAL_TABLET | Freq: Once | ORAL | Status: AC
  Administered 2016-10-24: 500 mg via ORAL
  Filled 2016-10-24: qty 2

## 2016-10-24 MED ORDER — GI COCKTAIL ~~LOC~~: 30.0000 mL | Freq: Four times a day (QID) | ORAL | Status: DC | PRN

## 2016-10-24 MED ORDER — HALOPERIDOL LACTATE 5 MG/ML IJ SOLN: 2.0000 mg | Freq: Four times a day (QID) | INTRAMUSCULAR | Status: DC | PRN

## 2016-10-24 MED ORDER — MORPHINE SULFATE (PF) 4 MG/ML IV SOLN
4.0000 mg | Freq: Once | INTRAVENOUS | Status: AC
  Administered 2016-10-24: 4 mg via INTRAVENOUS
  Filled 2016-10-24: qty 1

## 2016-10-24 MED ORDER — PANTOPRAZOLE SODIUM 40 MG PO TBEC
40.0000 mg | DELAYED_RELEASE_TABLET | Freq: Every day | ORAL | Status: DC
  Administered 2016-10-24 – 2016-10-29 (×5): 40 mg via ORAL
  Filled 2016-10-24 (×6): qty 1

## 2016-10-24 MED ORDER — RANITIDINE HCL 150 MG PO TABS
150.0000 mg | ORAL_TABLET | Freq: Every day | ORAL | Status: DC | PRN
  Filled 2016-10-24: qty 1

## 2016-10-24 MED ORDER — DOCUSATE SODIUM 100 MG PO CAPS
100.0000 mg | ORAL_CAPSULE | Freq: Two times a day (BID) | ORAL | Status: DC
  Administered 2016-10-24 – 2016-10-26 (×2): 100 mg via ORAL
  Filled 2016-10-24 (×5): qty 1

## 2016-10-24 MED ORDER — ACETAMINOPHEN 650 MG RE SUPP
650.0000 mg | Freq: Four times a day (QID) | RECTAL | Status: DC | PRN
  Administered 2016-10-24 – 2016-10-26 (×2): 650 mg via RECTAL
  Filled 2016-10-24 (×2): qty 1

## 2016-10-24 MED ORDER — SODIUM CHLORIDE 0.45 % IV SOLN
INTRAVENOUS | Status: DC
  Administered 2016-10-24: 12:00:00 via INTRAVENOUS

## 2016-10-24 MED ORDER — ACETAMINOPHEN 325 MG PO TABS
650.0000 mg | ORAL_TABLET | Freq: Four times a day (QID) | ORAL | Status: DC | PRN
  Filled 2016-10-24: qty 2

## 2016-10-24 MED ORDER — ONDANSETRON HCL 4 MG PO TABS
4.0000 mg | ORAL_TABLET | Freq: Four times a day (QID) | ORAL | Status: DC | PRN
  Administered 2016-10-27: 4 mg via ORAL
  Filled 2016-10-24: qty 1

## 2016-10-24 MED ORDER — ESCITALOPRAM OXALATE 10 MG PO TABS
5.0000 mg | ORAL_TABLET | Freq: Every day | ORAL | Status: DC
  Administered 2016-10-24 – 2016-10-29 (×5): 5 mg via ORAL
  Filled 2016-10-24 (×6): qty 1

## 2016-10-24 MED ORDER — DONEPEZIL HCL 10 MG PO TABS
10.0000 mg | ORAL_TABLET | Freq: Every day | ORAL | Status: DC
  Administered 2016-10-26 – 2016-10-28 (×3): 10 mg via ORAL
  Filled 2016-10-24 (×4): qty 1

## 2016-10-24 MED ORDER — CHLORHEXIDINE GLUCONATE CLOTH 2 % EX PADS
6.0000 | MEDICATED_PAD | Freq: Every day | CUTANEOUS | Status: AC
  Administered 2016-10-25 – 2016-10-29 (×5): 6 via TOPICAL

## 2016-10-24 MED ORDER — IOPAMIDOL (ISOVUE-370) INJECTION 76%
INTRAVENOUS | Status: AC
  Administered 2016-10-24: 100 mL
  Filled 2016-10-24: qty 100

## 2016-10-24 MED ORDER — ENOXAPARIN SODIUM 40 MG/0.4ML ~~LOC~~ SOLN
40.0000 mg | Freq: Every day | SUBCUTANEOUS | Status: DC
  Administered 2016-10-24: 40 mg via SUBCUTANEOUS
  Filled 2016-10-24: qty 0.4

## 2016-10-24 MED ORDER — DEXTROSE 5 % IV SOLN
1.0000 g | Freq: Once | INTRAVENOUS | Status: AC
  Administered 2016-10-24: 1 g via INTRAVENOUS
  Filled 2016-10-24: qty 10

## 2016-10-24 MED ORDER — ONDANSETRON HCL 4 MG/2ML IJ SOLN
4.0000 mg | Freq: Four times a day (QID) | INTRAMUSCULAR | Status: DC | PRN
  Administered 2016-10-24 – 2016-10-29 (×2): 4 mg via INTRAVENOUS
  Filled 2016-10-24 (×4): qty 2

## 2016-10-24 MED ORDER — CLOPIDOGREL BISULFATE 75 MG PO TABS
75.0000 mg | ORAL_TABLET | Freq: Every day | ORAL | Status: DC
  Administered 2016-10-24 – 2016-10-27 (×3): 75 mg via ORAL
  Filled 2016-10-24 (×4): qty 1

## 2016-10-24 MED ORDER — MUPIROCIN 2 % EX OINT
1.0000 "application " | TOPICAL_OINTMENT | Freq: Two times a day (BID) | CUTANEOUS | Status: AC
  Administered 2016-10-24 – 2016-10-29 (×10): 1 via NASAL
  Filled 2016-10-24 (×4): qty 22

## 2016-10-24 NOTE — ED Notes (Addendum)
Pt cleaned of moderate amount of emesis. Wife states this is not unusual for pt to vomit. Grossly red  Urine collected per policy from condom cath collection which was discussed with and approved by Dr. Tomi Bamberger.

## 2016-10-24 NOTE — ED Triage Notes (Signed)
Pt from home by EMS c/o chest pain that woke pt from sleep and hematuria. Pt has dementia, wife reports pt woke her this morning c/o chest pain. Wife gave pt tums and felt that pain [place] have gotten better. Pt was treated on 7/3 for a UTI and given antibiotics which pt completed. Wife reports she noticed hematuria this morning in pts brief

## 2016-10-24 NOTE — ED Notes (Signed)
Pt agains vomits. Dr, knapp aware.

## 2016-10-24 NOTE — ED Notes (Signed)
Patient transported to CT 

## 2016-10-24 NOTE — ED Notes (Signed)
ED Provider at bedside. 

## 2016-10-24 NOTE — ED Provider Notes (Signed)
[facility] DEPT Provider Note   CSN: [id number] Arrival date & time: 10/24/16  4709     History   Chief Complaint Chief Complaint  Patient presents with  . Chest Pain  . Recurrent UTI    HPI [name] is a 83 y.o. male.  HPI Hx provided by the wife.  She states pt woke up at 3 am and was complaining of chest pain pointing to the left chest.  He complained for about an hour or so and his wife called 911.  She gave him some antacids and it seemed to help.  Pt has dementia so the history is limited.  At baseline, he is bed ridden from an old hip injury.  Pt complains of his shoulder hurting on the left side and it goes to his back.  That pain persists.  He cannot describe it further.  No shortness of breath.  No fevers.  No vomiting.  No diarrhea.  No history of heart disease.  No PE or DVT.   Past Medical History:  Diagnosis Date  . BPH (benign prostatic hypertrophy)   . Dementia   . Diverticulosis of colon   . History of shingles   . Hypertension   . Hypothyroidism   . Multiple fractures   . Pancreatitis 2011   and gallstone pancreatitis and Ecoli bacteremia  . Pneumonia    2016  . Rabbit fever 1940  . Renal mass 2011   Eval by [name] echogenic mass on u/s. followed by [name]- observation as of 09/2010  . Stroke ([place]) 02/2015   with L sided weakness.     Patient Active Problem List   Diagnosis Date Noted  . Gross hematuria 10/04/2016  . Syncope 06/29/2016  . History of sepsis 12/29/2015  . Renal cell cancer ([place])   . Dysuria 12/15/2015  . Cough 09/09/2015  . Orthostatic hypotension 04/29/2015  . Dementia 04/29/2015  . Femur fracture, left ([place]) 04/02/2015  . Distal radial fracture 04/02/2015  . Acute blood loss anemia, post operative  03/17/2015  . Leukocytosis 03/16/2015  . Chronic diastolic CHF (congestive heart failure), NYHA class 1 ([place]) 03/16/2015  . Chronic constipation, unspecified  03/16/2015  . Left radial fracture [id number]  . Recent  right MCA stroke ([place]) 02/09/2015  . Essential hypertension 02/09/2015  . Lewy body dementia without behavioral disturbance   . Vomiting 05/11/2014  . Ascending aortic aneurysm ([place]) 07/19/2012  . Barrett's esophagus 09/28/2006    Past Surgical History:  Procedure Laterality Date  . APPENDECTOMY  1959  . CHOLECYSTECTOMY  10/2009  . EYE MUSCLE SURGERY     12/12 had double vision Dr. Juleen China at [facility]  . JOINT REPLACEMENT  04/30/2001   bilateral TKR  . KNEE ARTHROSCOPY  06/1995   left Dr Derrel Nip  . OPEN REDUCTION INTERNAL FIXATION (ORIF) DISTAL RADIAL FRACTURE Left 03/16/2015   Procedure: OPEN REDUCTION INTERNAL FIXATION (ORIF) DISTAL RADIAL FRACTURE;  Surgeon: Iran Planas, MD;  Location: [facility];  Service: Orthopedics;  Laterality: Left;  . ORIF FEMUR FRACTURE Left 03/16/2015   Procedure: OPEN REDUCTION INTERNAL FIXATION (ORIF) DISTAL FEMUR FRACTURE;  Surgeon: Paralee Cancel, MD;  Location: [facility];  Service: Orthopedics;  Laterality: Left;  . ORIF HIP FRACTURE Left 12/26/2014   Procedure: OPEN REDUCTION INTERNAL FIXATION HIP;  Surgeon: Paralee Cancel, MD;  Location: [facility];  Service: Orthopedics;  Laterality: Left;  . THYROIDECTOMY, PARTIAL  1966       Home Medications    Prior to Admission medications  Medication Sig Start Date End Date Taking? Authorizing Provider  B Complex-C (B-COMPLEX WITH VITAMIN C) tablet Take 1 tablet by mouth daily. 07/13/12  Yes Rai, Ripudeep Raliegh Ip, MD  clopidogrel (PLAVIX) 75 MG tablet Take 1 tablet by mouth daily 09/01/16  Yes Tonia Ghent, MD  docusate sodium (COLACE) 100 MG capsule Take 100 mg by mouth 2 (two) times daily.   Yes [provider]  donepezil (ARICEPT) 10 MG tablet Take 1 tablet (10 mg total) by mouth daily. Patient taking differently: Take 10 mg by mouth at bedtime.  05/23/16  Yes Tonia Ghent, MD  escitalopram (LEXAPRO) 5 MG tablet Take 1 tablet (5 mg total) by mouth daily. Patient taking differently: Take 5 mg by mouth every  morning.  05/23/16  Yes Tonia Ghent, MD  IRON PO Take 1 tablet by mouth 2 (two) times daily.    Yes [provider]  levothyroxine (SYNTHROID, LEVOTHROID) 100 MCG tablet TAKE 100 MCG BY MOUTH DAILY 05/23/16  Yes Tonia Ghent, MD  Multiple Vitamin (MULTIVITAMIN) tablet Take 1 tablet by mouth daily.     Yes [provider]  ondansetron (ZOFRAN) 4 MG tablet Take 1 tablet (4 mg total) by mouth every 8 (eight) hours as needed for nausea or vomiting. 07/13/16  Yes Mesner, Corene Cornea, MD  pantoprazole (PROTONIX) 40 MG tablet Take 1 tablet by mouth daily 08/09/16  Yes Tonia Ghent, MD  polyethylene glycol (MIRALAX / GLYCOLAX) packet Take 17 g by mouth daily. Patient taking differently: Take 17 g by mouth daily as needed for mild constipation.  12/30/14  Yes Rama, Venetia Maxon, MD  Probiotic Product (PROBIOTIC PO) Take 1 tablet by mouth daily.   Yes [provider]  RaNITidine HCl (ACID CONTROL PO) Take 2 tablets by mouth daily as needed (acid).   Yes [provider]    Family History Family History  Problem Relation Age of Onset  . Hypertension Mother   . Stroke Mother   . Alcohol abuse Father   . Cancer Brother        prostate CA  . Heart disease Brother     Social History Social History  Substance Use Topics  . Smoking status: Never Smoker  . Smokeless tobacco: Never Used  . Alcohol use No     Allergies   Sulfa antibiotics and Valtrex [valacyclovir hcl]   Review of Systems Review of Systems  Constitutional: Negative for fever.  Respiratory: Positive for cough.   Genitourinary: Positive for hematuria (wife noticed last night, hx of uti previously).  All other systems reviewed and are negative.    Physical Exam Updated Vital Signs BP (!) 143/103   Pulse (!) 110   Temp (!) 97.1 F (36.2 C) (Oral)   Resp (!) 21   SpO2 94%   Physical Exam  Constitutional: No distress.  Elderly frail  HENT:  Head: Normocephalic and atraumatic.  Right  Ear: External ear normal.  Left Ear: External ear normal.  Eyes: Conjunctivae are normal. Right eye exhibits no discharge. Left eye exhibits no discharge. No scleral icterus.  Neck: Neck supple. No tracheal deviation present.  Cardiovascular: Normal rate, regular rhythm and intact distal pulses.   Pulmonary/Chest: Effort normal and breath sounds normal. No stridor. No respiratory distress. He has no wheezes. He has no rales.  Abdominal: Soft. Bowel sounds are normal. He exhibits no distension. There is no tenderness. There is no rebound and no guarding.  Musculoskeletal: He exhibits no edema or tenderness.  Right shoulder: Normal.       Left shoulder: Normal. He exhibits normal range of motion and no tenderness.  Neurological: He is alert. He has normal strength. No cranial nerve deficit (no facial droop, extraocular movements intact, no slurred speech) or sensory deficit. He exhibits normal muscle tone. He displays no seizure activity. Coordination normal.  Skin: Skin is warm and dry. No rash noted.  Psychiatric: He has a normal mood and affect.  Nursing note and vitals reviewed.    ED Treatments / Results  Labs (all labs ordered are listed, but only abnormal results are displayed) Labs Reviewed  BASIC METABOLIC PANEL - Abnormal; Notable for the following:       Result Value   Glucose, Bld 117 (*)    BUN 24 (*)    GFR calc non Af Amer 59 (*)    All other components within normal limits  CBC - Abnormal; Notable for the following:    WBC 13.9 (*)    All other components within normal limits  D-DIMER, QUANTITATIVE (NOT AT [facility]) - Abnormal; Notable for the following:    D-Dimer, Quant 2.10 (*)    All other components within normal limits  URINALYSIS, ROUTINE W REFLEX MICROSCOPIC  I-STAT TROPONIN, ED  I-STAT TROPONIN, ED    EKG  EKG Interpretation  Date/Time:  Monday October 24 2016 06:30:31 EDT Ventricular Rate:  61 PR Interval:    QRS Duration: 99 QT Interval:  432 QTC  Calculation: 436 R Axis:   -60 Text Interpretation:  Sinus rhythm LAD, consider left anterior fascicular block Low voltage, extremity and precordial leads Nonspecific T abnormalities, lateral leads No significant change since last tracing Confirmed by Thayer Jew [phone]) on 10/24/2016 6:55:21 AM Also confirmed by Thayer Jew [phone]), editor Hattie Perch [phone])  on 10/24/2016 9:35:37 AM       Radiology Dg Chest 2 View  Result Date: 10/24/2016 CLINICAL DATA:  Chest pain. EXAM: CHEST  2 VIEW COMPARISON:  Radiographs of July 13, 2016. FINDINGS: Stable cardiomediastinal silhouette. No pneumothorax is noted. Minimal right basilar subsegmental atelectasis is noted. Mildly increased left basilar opacity is noted concerning for atelectasis or infiltrate with associated pleural effusion. Degenerative joint disease is seen involving right shoulder joint including narrowing of subacromial space suggesting rotator cuff injury. IMPRESSION: Minimal right basilar subsegmental atelectasis. Mildly increased left basilar opacity concerning for atelectasis or infiltrate with associated pleural effusion. Electronically Signed   By: Marijo Conception, M.D.   On: 10/24/2016 07:18   Ct Angio Chest Pe W And/or Wo Contrast  Result Date: 10/24/2016 CLINICAL DATA:  Pt from home by EMS c/o chest pain that woke pt from sleep and hematuria. Pt has dementia, wife reports pt woke her this morning c/o chest pain. Wife gave pt Tums and felt that pain Rudder have gotten better. 70 ml iso 370 EXAM: CT ANGIOGRAPHY CHEST WITH CONTRAST TECHNIQUE: Multidetector CT imaging of the chest was performed using the standard protocol during bolus administration of intravenous contrast. Multiplanar CT image reconstructions and MIPs were obtained to evaluate the vascular anatomy. CONTRAST:  100 cc Isovue 370 COMPARISON:  Chest x-ray 10/24/2016 FINDINGS: Cardiovascular: Heart size is normal. No significant coronary artery calcifications. No  pericardial effusion. The ascending aorta is 4.0 cm. There is minimal atherosclerosis of the aortic arch. The pulmonary arteries are well opacified. No evidence for emboli. Mediastinum/Nodes: No mediastinal, hilar, or axillary adenopathy. Esophagus is normal in appearance. Lungs/Pleura: There is perihilar peribronchial thickening. In the left lower lobe  there is consolidation associated with air bronchograms, likely infectious. Minimal atelectasis identified at the right lower lobe. No suspicious pulmonary nodules. No pleural effusions. Upper Abdomen: A left upper pole cyst measures at least 5.3 cm. A right upper pole renal cyst is partially imaged. Small amount layering dependent hyperdense material is identified within the stomach, consistent with ingested Tums administered earlier per history. Musculoskeletal: Kyphosis and degenerative changes in the thoracic spine. Review of the MIP images confirms the above findings. IMPRESSION: 1. Technically adequate exam showing no acute pulmonary embolus. 2. Left lower lobe consolidation and air bronchograms. 3. Ascending aortic aneurysm. Recommend annual imaging followup by CTA or MRA. This recommendation follows 2010 ACCF/AHA/AATS/ACR/ASA/SCA/SCAI/SIR/STS/SVM Guidelines for the Diagnosis and Management of Patients with Thoracic Aortic Disease. Circulation. 2010; 121: B201-E071 Aortic aneurysm NOS (ICD10-I71.9). Aortic Atherosclerosis (ICD10-I70.0). 4. Bronchitic changes. 5. Bilateral renal cysts. 6. Thoracic spondylosis. Electronically Signed   By: Nolon Nations M.D.   On: 10/24/2016 09:38    Procedures Procedures (including critical care time)  Medications Ordered in ED Medications  cefTRIAXone (ROCEPHIN) 1 g in dextrose 5 % 50 mL IVPB (not administered)  azithromycin (ZITHROMAX) tablet 500 mg (not administered)  morphine 4 MG/ML injection 4 mg (4 mg Intravenous Given 10/24/16 0753)  iopamidol (ISOVUE-370) 76 % injection (100 mLs  Contrast Given 10/24/16  0910)  ondansetron (ZOFRAN) injection 4 mg (4 mg Intravenous Given 10/24/16 1026)     Initial Impression / Assessment and Plan / ED Course  I have reviewed the triage vital signs and the nursing notes.  Pertinent labs & imaging results that were available during my care of the patient were reviewed by me and considered in my medical decision making (see chart for details).   patient presented to the emergency room with complaints of chest pain that started this morning. Wife states he has had a bit of a cough but has not had any other infectious type symptoms. This morning he was complaining of pain in the left side of his chest radiating to his back. Initial EKG and troponin are normal.   The d-dimer was elevated so it CT angios of the chest was performed. No evidence of pulmonary embolism but it does suggest pneumonia. Incidental aneurysm noted but no evidence of dissection or rupture. The patient's symptoms however are concerning for the possibility of angina as well. Considering his age and comorbidities I will start him on antibiotics to cover for pneumonia and consult with medical service for overnight observation, serial cardiac enzymes.  Final Clinical Impressions(s) / ED Diagnoses   Final diagnoses:  Chest pain, unspecified type  Community acquired pneumonia of left lung, unspecified part of lung  Thoracic aortic aneurysm without rupture [facility])    New Prescriptions New Prescriptions   No medications on file     Dorie Rank, MD 10/24/16 1030

## 2016-10-24 NOTE — ED Notes (Signed)
No urine noted in collection.

## 2016-10-24 NOTE — H&P (Addendum)
History and Physical    [name] BDZ:[id number] DOB: May 02, 1930 DOA: 10/24/2016  PCP: Tonia Ghent, MD Patient coming from: home  Chief Complaint: CP  HPI: [name] is a 83 y.o. male with medical history significant of BPH, dementia, diverticolosis, HTN, Hypothyroid, Pancreatitis, CVA w/ L sided weakness.    Level V caveat applies as patient is severely demented and unable to provide reliable history. History provided in part by EDP, EMS and by patient's full-time caregiver and wife. At time of my examination patient is without any complaint.  Chest pain. Started approximately 03:00 on day of admission. Left chest. Called EMS and partially one hour after onset of symptoms. Patient was given some antacids by his wife which seemed to help. Patient's base line bedridden due to an old hip injury. Denies shortness of breath, palpitations, fevers, dysuria, frequency, flank pain, lower extremity swelling, focal neurological deficit.. Patient's wife thinks that his symptoms are likely related to reflux as he does have this from time to time and symptoms slowly resolved after antacids. Patient was noted to have gross hematuria in his depends starting 1 day prior to admission. Patient is bedbound at baseline. Patient did have a couple of episodes of emesis since presentation to the ED. No history of cardiac workup in the past.  ED Course: Ceftriaxone and azithromycin. Given 4 mg of morphine at time of arrival. Zofran administered after nausea and vomiting.  Review of Systems: As per HPI otherwise all other systems reviewed and are negative  Ambulatory Status: Bedbound  Past Medical History:  Diagnosis Date  . BPH (benign prostatic hypertrophy)   . Dementia   . Diverticulosis of colon   . History of shingles   . Hypertension   . Hypothyroidism   . Multiple fractures   . Pancreatitis 2011   and gallstone pancreatitis and Ecoli bacteremia  . Pneumonia    2016  . Rabbit fever 1940   . Renal mass 2011   Eval by [name] echogenic mass on u/s. followed by [name]- observation as of 09/2010  . Stroke ([place]) 02/2015   with L sided weakness.     Past Surgical History:  Procedure Laterality Date  . APPENDECTOMY  1959  . CHOLECYSTECTOMY  10/2009  . EYE MUSCLE SURGERY     12/12 had double vision Dr. Juleen China at [facility]  . JOINT REPLACEMENT  04/30/2001   bilateral TKR  . KNEE ARTHROSCOPY  06/1995   left Dr Derrel Nip  . OPEN REDUCTION INTERNAL FIXATION (ORIF) DISTAL RADIAL FRACTURE Left 03/16/2015   Procedure: OPEN REDUCTION INTERNAL FIXATION (ORIF) DISTAL RADIAL FRACTURE;  Surgeon: Iran Planas, MD;  Location: [place];  Service: Orthopedics;  Laterality: Left;  . ORIF FEMUR FRACTURE Left 03/16/2015   Procedure: OPEN REDUCTION INTERNAL FIXATION (ORIF) DISTAL FEMUR FRACTURE;  Surgeon: Paralee Cancel, MD;  Location: [place];  Service: Orthopedics;  Laterality: Left;  . ORIF HIP FRACTURE Left 12/26/2014   Procedure: OPEN REDUCTION INTERNAL FIXATION HIP;  Surgeon: Paralee Cancel, MD;  Location: [facility];  Service: Orthopedics;  Laterality: Left;  . THYROIDECTOMY, PARTIAL  1966    Social History   Social History  . Marital status: Married    Spouse name: N/A  . Number of children: N/A  . Years of education: N/A   Occupational History  . Not on file.   Social History Main Topics  . Smoking status: Never Smoker  . Smokeless tobacco: Never Used  . Alcohol use No  . Drug use:  No  . Sexual activity: Not on file   Other Topics Concern  . Not on file   Social History Narrative   Retired Dealer.  Lives with wife.  He uses a cane occasionally.  Stairs to get into the house.  No home services recently.      Allergies  Allergen Reactions  . Sulfa Antibiotics Hives  . Valtrex [Valacyclovir Hcl] Nausea And Vomiting    NV    Family History  Problem Relation Age of Onset  . Hypertension Mother   . Stroke Mother   . Alcohol abuse Father   . Cancer Brother        prostate CA  .  Heart disease Brother       Prior to Admission medications   Medication Sig Start Date End Date Taking? Authorizing Provider  B Complex-C (B-COMPLEX WITH VITAMIN C) tablet Take 1 tablet by mouth daily. 07/13/12  Yes Rai, Ripudeep Raliegh Ip, MD  clopidogrel (PLAVIX) 75 MG tablet Take 1 tablet by mouth daily 09/01/16  Yes Tonia Ghent, MD  docusate sodium (COLACE) 100 MG capsule Take 100 mg by mouth 2 (two) times daily.   Yes [provider]  donepezil (ARICEPT) 10 MG tablet Take 1 tablet (10 mg total) by mouth daily. Patient taking differently: Take 10 mg by mouth at bedtime.  05/23/16  Yes Tonia Ghent, MD  escitalopram (LEXAPRO) 5 MG tablet Take 1 tablet (5 mg total) by mouth daily. Patient taking differently: Take 5 mg by mouth every morning.  05/23/16  Yes Tonia Ghent, MD  IRON PO Take 1 tablet by mouth 2 (two) times daily.    Yes [provider]  levothyroxine (SYNTHROID, LEVOTHROID) 100 MCG tablet TAKE 100 MCG BY MOUTH DAILY 05/23/16  Yes Tonia Ghent, MD  Multiple Vitamin (MULTIVITAMIN) tablet Take 1 tablet by mouth daily.     Yes [provider]  ondansetron (ZOFRAN) 4 MG tablet Take 1 tablet (4 mg total) by mouth every 8 (eight) hours as needed for nausea or vomiting. 07/13/16  Yes Mesner, Corene Cornea, MD  pantoprazole (PROTONIX) 40 MG tablet Take 1 tablet by mouth daily 08/09/16  Yes Tonia Ghent, MD  polyethylene glycol (MIRALAX / GLYCOLAX) packet Take 17 g by mouth daily. Patient taking differently: Take 17 g by mouth daily as needed for mild constipation.  12/30/14  Yes Rama, Venetia Maxon, MD  Probiotic Product (PROBIOTIC PO) Take 1 tablet by mouth daily.   Yes [provider]  RaNITidine HCl (ACID CONTROL PO) Take 2 tablets by mouth daily as needed (acid).   Yes [provider]    Physical Exam: Vitals:   10/24/16 1015 10/24/16 1030 10/24/16 1045 10/24/16 1100  BP: (!) 143/103 101/69 104/70 114/72  Pulse: (!) 110 88 88 89  Resp: (!)  21 18 18 17   Temp:      TempSrc:      SpO2: 94% 92% 94% 95%     General:  Frail and cachectic appearing. Sleeping in bed Eyes:  PERRL, EOMI, normal lids, iris ENT:  grossly normal hearing, lips & tongue, mmm Neck:  no LAD, masses or thyromegaly Cardiovascular:  RRR, no m/r/g. No LE edema.  Respiratory:  CTA bilaterally, no w/r/r. Normal respiratory effort. Abdomen:  soft, ntnd, NABS Skin:  no rash or induration seen on limited exam Musculoskeletal: minimal participation in exam. No bony abnormality. Thin.  Psychiatric: follows basic commands, pleasant, AOx0.  Neurologic:  CN 2-12 grossly intact, sensation intact  Labs on Admission: I have personally reviewed following labs and imaging studies  CBC:  Recent Labs Lab 10/24/16 0647  WBC 13.9*  HGB 13.5  HCT 41.2  MCV 88.8  PLT 706   Basic Metabolic Panel:  Recent Labs Lab 10/24/16 0647  NA 142  K 4.2  CL 107  CO2 27  GLUCOSE 117*  BUN 24*  CREATININE 1.11  CALCIUM 8.9   GFR: CrCl cannot be calculated (Unknown ideal weight.). Liver Function Tests: No results for input(s): AST, ALT, ALKPHOS, BILITOT, PROT, ALBUMIN in the last 168 hours. No results for input(s): LIPASE, AMYLASE in the last 168 hours. No results for input(s): AMMONIA in the last 168 hours. Coagulation Profile: No results for input(s): INR, PROTIME in the last 168 hours. Cardiac Enzymes: No results for input(s): CKTOTAL, CKMB, CKMBINDEX, TROPONINI in the last 168 hours. BNP (last 3 results) No results for input(s): PROBNP in the last 8760 hours. HbA1C: No results for input(s): HGBA1C in the last 72 hours. CBG: No results for input(s): GLUCAP in the last 168 hours. Lipid Profile: No results for input(s): CHOL, HDL, LDLCALC, TRIG, CHOLHDL, LDLDIRECT in the last 72 hours. Thyroid Function Tests: No results for input(s): TSH, T4TOTAL, FREET4, T3FREE, THYROIDAB in the last 72 hours. Anemia Panel: No results for input(s): VITAMINB12, FOLATE,  FERRITIN, TIBC, IRON, RETICCTPCT in the last 72 hours. Urine analysis:    Component Value Date/Time   COLORURINE RED (A) 10/24/2016 0945   APPEARANCEUR TURBID (A) 10/24/2016 0945   LABSPEC  10/24/2016 0945    TEST NOT REPORTED DUE TO COLOR INTERFERENCE OF URINE PIGMENT   PHURINE  10/24/2016 0945    TEST NOT REPORTED DUE TO COLOR INTERFERENCE OF URINE PIGMENT   GLUCOSEU (A) 10/24/2016 0945    TEST NOT REPORTED DUE TO COLOR INTERFERENCE OF URINE PIGMENT   GLUCOSEU NEGATIVE 10/04/2016 1731   HGBUR (A) 10/24/2016 0945    TEST NOT REPORTED DUE TO COLOR INTERFERENCE OF URINE PIGMENT   HGBUR large 03/31/2010 1043   BILIRUBINUR (A) 10/24/2016 0945    TEST NOT REPORTED DUE TO COLOR INTERFERENCE OF URINE PIGMENT   BILIRUBINUR negative 02/11/2016 1200   KETONESUR (A) 10/24/2016 0945    TEST NOT REPORTED DUE TO COLOR INTERFERENCE OF URINE PIGMENT   PROTEINUR (A) 10/24/2016 0945    TEST NOT REPORTED DUE TO COLOR INTERFERENCE OF URINE PIGMENT   UROBILINOGEN 1.0 10/04/2016 1731   NITRITE (A) 10/24/2016 0945    TEST NOT REPORTED DUE TO COLOR INTERFERENCE OF URINE PIGMENT   LEUKOCYTESUR (A) 10/24/2016 0945    TEST NOT REPORTED DUE TO COLOR INTERFERENCE OF URINE PIGMENT    Creatinine Clearance: CrCl cannot be calculated (Unknown ideal weight.).  Sepsis Labs: @LABRCNTIP (procalcitonin:4,lacticidven:4) )No results found for this or any previous visit (from the past 240 hour(s)).   Radiological Exams on Admission: Dg Chest 2 View  Result Date: 10/24/2016 CLINICAL DATA:  Chest pain. EXAM: CHEST  2 VIEW COMPARISON:  Radiographs of July 13, 2016. FINDINGS: Stable cardiomediastinal silhouette. No pneumothorax is noted. Minimal right basilar subsegmental atelectasis is noted. Mildly increased left basilar opacity is noted concerning for atelectasis or infiltrate with associated pleural effusion. Degenerative joint disease is seen involving right shoulder joint including narrowing of subacromial space  suggesting rotator cuff injury. IMPRESSION: Minimal right basilar subsegmental atelectasis. Mildly increased left basilar opacity concerning for atelectasis or infiltrate with associated pleural effusion. Electronically Signed   By: Marijo Conception, M.D.   On: 10/24/2016 07:18  Ct Angio Chest Pe W And/or Wo Contrast  Result Date: 10/24/2016 CLINICAL DATA:  Pt from home by EMS c/o chest pain that woke pt from sleep and hematuria. Pt has dementia, wife reports pt woke her this morning c/o chest pain. Wife gave pt Tums and felt that pain Beasley have gotten better. 70 ml iso 370 EXAM: CT ANGIOGRAPHY CHEST WITH CONTRAST TECHNIQUE: Multidetector CT imaging of the chest was performed using the standard protocol during bolus administration of intravenous contrast. Multiplanar CT image reconstructions and MIPs were obtained to evaluate the vascular anatomy. CONTRAST:  100 cc Isovue 370 COMPARISON:  Chest x-ray 10/24/2016 FINDINGS: Cardiovascular: Heart size is normal. No significant coronary artery calcifications. No pericardial effusion. The ascending aorta is 4.0 cm. There is minimal atherosclerosis of the aortic arch. The pulmonary arteries are well opacified. No evidence for emboli. Mediastinum/Nodes: No mediastinal, hilar, or axillary adenopathy. Esophagus is normal in appearance. Lungs/Pleura: There is perihilar peribronchial thickening. In the left lower lobe there is consolidation associated with air bronchograms, likely infectious. Minimal atelectasis identified at the right lower lobe. No suspicious pulmonary nodules. No pleural effusions. Upper Abdomen: A left upper pole cyst measures at least 5.3 cm. A right upper pole renal cyst is partially imaged. Small amount layering dependent hyperdense material is identified within the stomach, consistent with ingested Tums administered earlier per history. Musculoskeletal: Kyphosis and degenerative changes in the thoracic spine. Review of the MIP images confirms the  above findings. IMPRESSION: 1. Technically adequate exam showing no acute pulmonary embolus. 2. Left lower lobe consolidation and air bronchograms. 3. Ascending aortic aneurysm. Recommend annual imaging followup by CTA or MRA. This recommendation follows 2010 ACCF/AHA/AATS/ACR/ASA/SCA/SCAI/SIR/STS/SVM Guidelines for the Diagnosis and Management of Patients with Thoracic Aortic Disease. Circulation. 2010; 121: K025-K270 Aortic aneurysm NOS (ICD10-I71.9). Aortic Atherosclerosis (ICD10-I70.0). 4. Bronchitic changes. 5. Bilateral renal cysts. 6. Thoracic spondylosis. Electronically Signed   By: Nolon Nations M.D.   On: 10/24/2016 09:38    EKG: Independently reviewed. Non-specific t wave changes. No ACS, sinus  Assessment/Plan Active Problems:   Barrett's esophagus   Dementia   Lobar pneumonia (HCC)   Acute lower UTI   Atypical chest pain   History of CVA (cerebrovascular accident)   Hypothyroid    LLL Pneumonia: noted on CT. Only sx is CP. Pt very demented and unable to give good hisotry.  - PNeumonia orderset - CTX/Azithro - f/u CX  UTI: hematuria for 2days at thome. UA suspcicious for UTI. Incontinent of urine and in depends at baseline.  - ABX as above - f/u UCX  CP: suspect GI/esophagitis vs cardiac vs diaphragm irritation due to LLL pneumonia. EKG w/o ACS,  - Tele, cycle trop - GI cocktail  Bedbound: secondary to L hip and femur fracture. Chronic condition since 2016. - PT/OT - Air overlay  H/O CVA: no current focal neuro deficits - contineu ASA, Plavix  Hypothyroid: - continue synthroid  Dementia/depression: - continue Aricept, lexapro - Haldol PRN  GERD/Esophagitis:  - continue Zantac, PPI - GI cocktail prn  Ascending aortic aneurysm: Noted to be 4 cm on CTA. New finding. -  Recommend yearly imaging follow-up studies.   DVT prophylaxis: hep  Code Status: DNR  Family Communication: Wife - caretaker  Disposition Plan: pending improvement  Consults called:  none  Admission status: observatio    MERRELL, DAVID J MD [facility]  If 7PM-7AM, please contact night-coverage [email or web address] Password [facility]  10/24/2016, 11:17 AM

## 2016-10-25 DIAGNOSIS — N390 Urinary tract infection, site not specified: Secondary | ICD-10-CM | POA: Diagnosis present

## 2016-10-25 DIAGNOSIS — J189 Pneumonia, unspecified organism: Secondary | ICD-10-CM | POA: Diagnosis not present

## 2016-10-25 DIAGNOSIS — Z681 Body mass index (BMI) 19 or less, adult: Secondary | ICD-10-CM | POA: Diagnosis not present

## 2016-10-25 DIAGNOSIS — E871 Hypo-osmolality and hyponatremia: Secondary | ICD-10-CM | POA: Diagnosis not present

## 2016-10-25 DIAGNOSIS — R7881 Bacteremia: Secondary | ICD-10-CM | POA: Diagnosis present

## 2016-10-25 DIAGNOSIS — Z7401 Bed confinement status: Secondary | ICD-10-CM | POA: Diagnosis not present

## 2016-10-25 DIAGNOSIS — N400 Enlarged prostate without lower urinary tract symptoms: Secondary | ICD-10-CM | POA: Diagnosis present

## 2016-10-25 DIAGNOSIS — Z7902 Long term (current) use of antithrombotics/antiplatelets: Secondary | ICD-10-CM | POA: Diagnosis not present

## 2016-10-25 DIAGNOSIS — R0789 Other chest pain: Secondary | ICD-10-CM | POA: Diagnosis not present

## 2016-10-25 DIAGNOSIS — R319 Hematuria, unspecified: Secondary | ICD-10-CM | POA: Diagnosis not present

## 2016-10-25 DIAGNOSIS — Z8673 Personal history of transient ischemic attack (TIA), and cerebral infarction without residual deficits: Secondary | ICD-10-CM | POA: Diagnosis not present

## 2016-10-25 DIAGNOSIS — Z96653 Presence of artificial knee joint, bilateral: Secondary | ICD-10-CM | POA: Diagnosis present

## 2016-10-25 DIAGNOSIS — J151 Pneumonia due to Pseudomonas: Secondary | ICD-10-CM | POA: Diagnosis not present

## 2016-10-25 DIAGNOSIS — R079 Chest pain, unspecified: Secondary | ICD-10-CM | POA: Diagnosis not present

## 2016-10-25 DIAGNOSIS — F0390 Unspecified dementia without behavioral disturbance: Secondary | ICD-10-CM | POA: Diagnosis present

## 2016-10-25 DIAGNOSIS — I10 Essential (primary) hypertension: Secondary | ICD-10-CM | POA: Diagnosis present

## 2016-10-25 DIAGNOSIS — F329 Major depressive disorder, single episode, unspecified: Secondary | ICD-10-CM | POA: Diagnosis present

## 2016-10-25 DIAGNOSIS — J181 Lobar pneumonia, unspecified organism: Secondary | ICD-10-CM | POA: Diagnosis present

## 2016-10-25 DIAGNOSIS — E890 Postprocedural hypothyroidism: Secondary | ICD-10-CM | POA: Diagnosis present

## 2016-10-25 DIAGNOSIS — I110 Hypertensive heart disease with heart failure: Secondary | ICD-10-CM | POA: Diagnosis present

## 2016-10-25 DIAGNOSIS — N179 Acute kidney failure, unspecified: Secondary | ICD-10-CM | POA: Diagnosis present

## 2016-10-25 DIAGNOSIS — I5032 Chronic diastolic (congestive) heart failure: Secondary | ICD-10-CM | POA: Diagnosis present

## 2016-10-25 DIAGNOSIS — N1330 Unspecified hydronephrosis: Secondary | ICD-10-CM | POA: Diagnosis present

## 2016-10-25 DIAGNOSIS — I712 Thoracic aortic aneurysm, without rupture: Secondary | ICD-10-CM | POA: Diagnosis present

## 2016-10-25 DIAGNOSIS — Z9049 Acquired absence of other specified parts of digestive tract: Secondary | ICD-10-CM | POA: Diagnosis not present

## 2016-10-25 DIAGNOSIS — K2270 Barrett's esophagus without dysplasia: Secondary | ICD-10-CM | POA: Diagnosis present

## 2016-10-25 DIAGNOSIS — E039 Hypothyroidism, unspecified: Secondary | ICD-10-CM | POA: Diagnosis present

## 2016-10-25 DIAGNOSIS — I69354 Hemiplegia and hemiparesis following cerebral infarction affecting left non-dominant side: Secondary | ICD-10-CM | POA: Diagnosis not present

## 2016-10-25 DIAGNOSIS — R64 Cachexia: Secondary | ICD-10-CM | POA: Diagnosis present

## 2016-10-25 DIAGNOSIS — R310 Gross hematuria: Secondary | ICD-10-CM | POA: Diagnosis present

## 2016-10-25 DIAGNOSIS — K5730 Diverticulosis of large intestine without perforation or abscess without bleeding: Secondary | ICD-10-CM | POA: Diagnosis present

## 2016-10-25 LAB — BLOOD CULTURE ID PANEL (REFLEXED)
ACINETOBACTER BAUMANNII: NOT DETECTED
CANDIDA ALBICANS: NOT DETECTED
CANDIDA GLABRATA: NOT DETECTED
CANDIDA PARAPSILOSIS: NOT DETECTED
CANDIDA TROPICALIS: NOT DETECTED
Candida krusei: NOT DETECTED
Carbapenem resistance: NOT DETECTED
ENTEROBACTER CLOACAE COMPLEX: NOT DETECTED
ESCHERICHIA COLI: NOT DETECTED
Enterobacteriaceae species: NOT DETECTED
Enterococcus species: NOT DETECTED
HAEMOPHILUS INFLUENZAE: NOT DETECTED
KLEBSIELLA PNEUMONIAE: NOT DETECTED
Klebsiella oxytoca: NOT DETECTED
Listeria monocytogenes: NOT DETECTED
Neisseria meningitidis: NOT DETECTED
PROTEUS SPECIES: NOT DETECTED
Pseudomonas aeruginosa: DETECTED — AB
Serratia marcescens: NOT DETECTED
Staphylococcus aureus (BCID): NOT DETECTED
Staphylococcus species: NOT DETECTED
Streptococcus agalactiae: NOT DETECTED
Streptococcus pneumoniae: NOT DETECTED
Streptococcus pyogenes: NOT DETECTED
Streptococcus species: NOT DETECTED

## 2016-10-25 LAB — BASIC METABOLIC PANEL
ANION GAP: 8 (ref 5–15)
BUN: 34 mg/dL — ABNORMAL HIGH (ref 6–20)
CALCIUM: 8.6 mg/dL — AB (ref 8.9–10.3)
CO2: 26 mmol/L (ref 22–32)
Chloride: 103 mmol/L (ref 101–111)
Creatinine, Ser: 1.77 mg/dL — ABNORMAL HIGH (ref 0.61–1.24)
GFR calc Af Amer: 39 mL/min — ABNORMAL LOW (ref >60)
GFR calc non Af Amer: 34 mL/min — ABNORMAL LOW (ref >60)
GLUCOSE: 128 mg/dL — AB (ref 65–99)
Potassium: 4.6 mmol/L (ref 3.5–5.1)
Sodium: 137 mmol/L (ref 135–145)

## 2016-10-25 LAB — LEGIONELLA PNEUMOPHILA SEROGP 1 UR AG: L. pneumophila Serogp 1 Ur Ag: NEGATIVE

## 2016-10-25 LAB — CBC
HEMATOCRIT: 37.6 % — AB (ref 39.0–52.0)
Hemoglobin: 12 g/dL — ABNORMAL LOW (ref 13.0–17.0)
MCH: 28.5 pg (ref 26.0–34.0)
MCHC: 31.9 g/dL (ref 30.0–36.0)
MCV: 89.3 fL (ref 78.0–100.0)
Platelets: 201 10*3/uL (ref 150–400)
RBC: 4.21 MIL/uL — ABNORMAL LOW (ref 4.22–5.81)
RDW: 15.6 % — AB (ref 11.5–15.5)
WBC: 22.8 10*3/uL — AB (ref 4.0–10.5)

## 2016-10-25 LAB — HIV ANTIBODY (ROUTINE TESTING W REFLEX): HIV Screen 4th Generation wRfx: NONREACTIVE

## 2016-10-25 MED ORDER — DEXTROSE-NACL 5-0.45 % IV SOLN
INTRAVENOUS | Status: DC
  Administered 2016-10-25 (×2): via INTRAVENOUS
  Administered 2016-10-27: 75 mL/h via INTRAVENOUS
  Administered 2016-10-27: 19:00:00 via INTRAVENOUS
  Administered 2016-10-28: 75 mL/h via INTRAVENOUS

## 2016-10-25 MED ORDER — DEXTROSE-NACL 5-0.9 % IV SOLN
INTRAVENOUS | Status: DC
  Administered 2016-10-25 – 2016-10-26 (×2): via INTRAVENOUS

## 2016-10-25 MED ORDER — ENOXAPARIN SODIUM 30 MG/0.3ML ~~LOC~~ SOLN
30.0000 mg | SUBCUTANEOUS | Status: DC
  Administered 2016-10-25 – 2016-10-28 (×4): 30 mg via SUBCUTANEOUS
  Filled 2016-10-25 (×4): qty 0.3

## 2016-10-25 MED ORDER — SODIUM CHLORIDE 0.9 % IV BOLUS (SEPSIS)
500.0000 mL | Freq: Once | INTRAVENOUS | Status: AC
  Administered 2016-10-25: 500 mL via INTRAVENOUS

## 2016-10-25 MED ORDER — MEROPENEM 1 G IV SOLR
1.0000 g | Freq: Two times a day (BID) | INTRAVENOUS | Status: DC
  Administered 2016-10-25 – 2016-10-27 (×5): 1 g via INTRAVENOUS
  Filled 2016-10-25 (×6): qty 1

## 2016-10-25 NOTE — Progress Notes (Signed)
PROGRESS NOTE    [name]  RJJ:[id number] DOB: 1930-06-06 DOA: 10/24/2016 PCP: Tonia Ghent, MD     Brief Narrative:    83 y.o. male with medical history significant of BPH, advanced dementia ( bed bound), diverticolosis, HTN, Hypothyroid, Pancreatitis, CVA w/ L sided weakness presents with sepsis like picture .    Assessment & Plan:    1-AMS 2/2 mostly  complicated UTI : with blood Cx growing pseudomonas , switch Abx to Meropenem , continue to hydrate , NPO . 2-Hx of CVA with bedbound status . 3-Advanced dementia : DNR/DNI . 4-cough with food : possible Aspiration with Pneumonia , NPO , SLP eval. 5-Acute renal failure 2/2 #1 , bolus him with NS now and continue IVF.  Prognosis is guardid .      DVT prophylaxis: (Lovenox) Code Status: (DNR/DNI) Family Communication: (DAUGHTER) Disposition Plan: (SNF   Consultants:   Procedures:     Antimicrobials: (specify start and planned stop date. Auto populated tables are space occupying and do not give end dates)  Meropenem    Subjective:  Bed bound , non communicative , stable , looks comfortable with no SOB or cough .   Objective: Vitals:   10/25/16 0048 10/25/16 0508 10/25/16 0919 10/25/16 1143  BP:  (!) 94/58 (!) 106/59   Pulse:  (!) 59 87   Resp:  15 16   Temp: 99.8 F (37.7 C) 98.6 F (37 C) 99.4 F (37.4 C) 98.9 F (37.2 C)  TempSrc:   Oral   SpO2:  96% 93%   Weight:      Height:        Intake/Output Summary (Last 24 hours) at 10/25/16 1258 Last data filed at 10/25/16 1000  Gross per 24 hour  Intake           963.33 ml  Output                0 ml  Net           963.33 ml   Filed Weights   10/24/16 1130 10/24/16 2123  Weight: 56.7 kg (125 lb) 47.2 kg (104 lb)    Examination:  General exam: Appears calm and comfortable  Respiratory system: Clear to auscultation. Respiratory effort normal. Cardiovascular system: S1 & S2 heard, RRR. No JVD, murmurs, rubs, gallops or clicks. No  pedal edema. Gastrointestinal system: Abdomen is nondistended, soft and nontender. No organomegaly or masses felt. Normal bowel sounds heard. Central nervous system: sleepy . Extremities: Symmetric 5 x 5 power. Skin: No rashes, lesions or ulcers Psychiatry: Not able to eval , drowsy .     Data Reviewed: I have personally reviewed following labs and imaging studies  CBC:  Recent Labs Lab 10/24/16 0647 10/25/16 0509  WBC 13.9* 22.8*  HGB 13.5 12.0*  HCT 41.2 37.6*  MCV 88.8 89.3  PLT 261 016   Basic Metabolic Panel:  Recent Labs Lab 10/24/16 0647 10/25/16 0509  NA 142 137  K 4.2 4.6  CL 107 103  CO2 27 26  GLUCOSE 117* 128*  BUN 24* 34*  CREATININE 1.11 1.77*  CALCIUM 8.9 8.6*   GFR: Estimated Creatinine Clearance: 21.1 mL/min (A) (by C-G formula based on SCr of 1.77 mg/dL (H)). Liver Function Tests: No results for input(s): AST, ALT, ALKPHOS, BILITOT, PROT, ALBUMIN in the last 168 hours. No results for input(s): LIPASE, AMYLASE in the last 168 hours. No results for input(s): AMMONIA in the last 168 hours. Coagulation Profile: No  results for input(s): INR, PROTIME in the last 168 hours. Cardiac Enzymes:  Recent Labs Lab 10/24/16 1208  TROPONINI <0.03  <0.03   BNP (last 3 results) No results for input(s): PROBNP in the last 8760 hours. HbA1C: No results for input(s): HGBA1C in the last 72 hours. CBG: No results for input(s): GLUCAP in the last 168 hours. Lipid Profile: No results for input(s): CHOL, HDL, LDLCALC, TRIG, CHOLHDL, LDLDIRECT in the last 72 hours. Thyroid Function Tests: No results for input(s): TSH, T4TOTAL, FREET4, T3FREE, THYROIDAB in the last 72 hours. Anemia Panel: No results for input(s): VITAMINB12, FOLATE, FERRITIN, TIBC, IRON, RETICCTPCT in the last 72 hours. Urine analysis:    Component Value Date/Time   COLORURINE RED (A) 10/24/2016 0945   APPEARANCEUR TURBID (A) 10/24/2016 0945   LABSPEC  10/24/2016 0945    TEST NOT REPORTED  DUE TO COLOR INTERFERENCE OF URINE PIGMENT   PHURINE  10/24/2016 0945    TEST NOT REPORTED DUE TO COLOR INTERFERENCE OF URINE PIGMENT   GLUCOSEU (A) 10/24/2016 0945    TEST NOT REPORTED DUE TO COLOR INTERFERENCE OF URINE PIGMENT   GLUCOSEU NEGATIVE 10/04/2016 1731   HGBUR (A) 10/24/2016 0945    TEST NOT REPORTED DUE TO COLOR INTERFERENCE OF URINE PIGMENT   HGBUR large 03/31/2010 1043   BILIRUBINUR (A) 10/24/2016 0945    TEST NOT REPORTED DUE TO COLOR INTERFERENCE OF URINE PIGMENT   BILIRUBINUR negative 02/11/2016 1200   KETONESUR (A) 10/24/2016 0945    TEST NOT REPORTED DUE TO COLOR INTERFERENCE OF URINE PIGMENT   PROTEINUR (A) 10/24/2016 0945    TEST NOT REPORTED DUE TO COLOR INTERFERENCE OF URINE PIGMENT   UROBILINOGEN 1.0 10/04/2016 1731   NITRITE (A) 10/24/2016 0945    TEST NOT REPORTED DUE TO COLOR INTERFERENCE OF URINE PIGMENT   LEUKOCYTESUR (A) 10/24/2016 0945    TEST NOT REPORTED DUE TO COLOR INTERFERENCE OF URINE PIGMENT   Sepsis Labs: @LABRCNTIP (procalcitonin:4,lacticidven:4)  ) Recent Results (from the past 240 hour(s))  Culture, blood (routine x 2) Call MD if unable to obtain prior to antibiotics being given     Status: None (Preliminary result)   Collection Time: 10/24/16 12:14 PM  Result Value Ref Range Status   Specimen Description BLOOD RIGHT ANTECUBITAL  Final   Special Requests IN PEDIATRIC BOTTLE Blood Culture adequate volume  Final   Culture  Setup Time   Final    GRAM NEGATIVE RODS IN PEDIATRIC BOTTLE Organism ID to follow CRITICAL RESULT CALLED TO, READ BACK BY AND VERIFIED WITH: E. Vernona Rieger Pharm.D. 10:00 10/25/16 ([email or web address])    Culture GRAM NEGATIVE RODS  Final   Report Status PENDING  Incomplete  Blood Culture ID Panel (Reflexed)     Status: Abnormal   Collection Time: 10/24/16 12:14 PM  Result Value Ref Range Status   Enterococcus species NOT DETECTED NOT DETECTED Final   Listeria monocytogenes NOT DETECTED NOT DETECTED Final   Staphylococcus  species NOT DETECTED NOT DETECTED Final   Staphylococcus aureus NOT DETECTED NOT DETECTED Final   Streptococcus species NOT DETECTED NOT DETECTED Final   Streptococcus agalactiae NOT DETECTED NOT DETECTED Final   Streptococcus pneumoniae NOT DETECTED NOT DETECTED Final   Streptococcus pyogenes NOT DETECTED NOT DETECTED Final   Acinetobacter baumannii NOT DETECTED NOT DETECTED Final   Enterobacteriaceae species NOT DETECTED NOT DETECTED Final   Enterobacter cloacae complex NOT DETECTED NOT DETECTED Final   Escherichia coli NOT DETECTED NOT DETECTED Final   Klebsiella oxytoca NOT DETECTED NOT  DETECTED Final   Klebsiella pneumoniae NOT DETECTED NOT DETECTED Final   Proteus species NOT DETECTED NOT DETECTED Final   Serratia marcescens NOT DETECTED NOT DETECTED Final   Carbapenem resistance NOT DETECTED NOT DETECTED Final   Haemophilus influenzae NOT DETECTED NOT DETECTED Final   Neisseria meningitidis NOT DETECTED NOT DETECTED Final   Pseudomonas aeruginosa DETECTED (A) NOT DETECTED Final    Comment: CRITICAL RESULT CALLED TO, READ BACK BY AND VERIFIED WITH: E. Vernona Rieger Pharm.D. 10:00 10/25/16 ([email or web address])    Candida albicans NOT DETECTED NOT DETECTED Final   Candida glabrata NOT DETECTED NOT DETECTED Final   Candida krusei NOT DETECTED NOT DETECTED Final   Candida parapsilosis NOT DETECTED NOT DETECTED Final   Candida tropicalis NOT DETECTED NOT DETECTED Final  MRSA PCR Screening     Status: Abnormal   Collection Time: 10/24/16 12:29 PM  Result Value Ref Range Status   MRSA by PCR POSITIVE (A) NEGATIVE Final    Comment:        The GeneXpert MRSA Assay (FDA approved for NASAL specimens only), is one component of a comprehensive MRSA colonization surveillance program. It is not intended to diagnose MRSA infection nor to guide or monitor treatment for MRSA infections. RESULT CALLED TO, READ BACK BY AND VERIFIED WITH: Willadean Carol RN 15:50 10/24/16 ([email or web address])          Radiology  Studies: Dg Chest 2 View  Result Date: 10/24/2016 CLINICAL DATA:  Chest pain. EXAM: CHEST  2 VIEW COMPARISON:  Radiographs of July 13, 2016. FINDINGS: Stable cardiomediastinal silhouette. No pneumothorax is noted. Minimal right basilar subsegmental atelectasis is noted. Mildly increased left basilar opacity is noted concerning for atelectasis or infiltrate with associated pleural effusion. Degenerative joint disease is seen involving right shoulder joint including narrowing of subacromial space suggesting rotator cuff injury. IMPRESSION: Minimal right basilar subsegmental atelectasis. Mildly increased left basilar opacity concerning for atelectasis or infiltrate with associated pleural effusion. Electronically Signed   By: Marijo Conception, M.D.   On: 10/24/2016 07:18   Ct Angio Chest Pe W And/or Wo Contrast  Result Date: 10/24/2016 CLINICAL DATA:  Pt from home by EMS c/o chest pain that woke pt from sleep and hematuria. Pt has dementia, wife reports pt woke her this morning c/o chest pain. Wife gave pt Tums and felt that pain [place] have gotten better. 70 ml iso 370 EXAM: CT ANGIOGRAPHY CHEST WITH CONTRAST TECHNIQUE: Multidetector CT imaging of the chest was performed using the standard protocol during bolus administration of intravenous contrast. Multiplanar CT image reconstructions and MIPs were obtained to evaluate the vascular anatomy. CONTRAST:  100 cc Isovue 370 COMPARISON:  Chest x-ray 10/24/2016 FINDINGS: Cardiovascular: Heart size is normal. No significant coronary artery calcifications. No pericardial effusion. The ascending aorta is 4.0 cm. There is minimal atherosclerosis of the aortic arch. The pulmonary arteries are well opacified. No evidence for emboli. Mediastinum/Nodes: No mediastinal, hilar, or axillary adenopathy. Esophagus is normal in appearance. Lungs/Pleura: There is perihilar peribronchial thickening. In the left lower lobe there is consolidation associated with air bronchograms, likely  infectious. Minimal atelectasis identified at the right lower lobe. No suspicious pulmonary nodules. No pleural effusions. Upper Abdomen: A left upper pole cyst measures at least 5.3 cm. A right upper pole renal cyst is partially imaged. Small amount layering dependent hyperdense material is identified within the stomach, consistent with ingested Tums administered earlier per history. Musculoskeletal: Kyphosis and degenerative changes in the thoracic spine. Review of the MIP images confirms the above  findings. IMPRESSION: 1. Technically adequate exam showing no acute pulmonary embolus. 2. Left lower lobe consolidation and air bronchograms. 3. Ascending aortic aneurysm. Recommend annual imaging followup by CTA or MRA. This recommendation follows 2010 ACCF/AHA/AATS/ACR/ASA/SCA/SCAI/SIR/STS/SVM Guidelines for the Diagnosis and Management of Patients with Thoracic Aortic Disease. Circulation. 2010; 121: O536-U440 Aortic aneurysm NOS (ICD10-I71.9). Aortic Atherosclerosis (ICD10-I70.0). 4. Bronchitic changes. 5. Bilateral renal cysts. 6. Thoracic spondylosis. Electronically Signed   By: Nolon Nations M.D.   On: 10/24/2016 09:38        Scheduled Meds: . azithromycin  500 mg Oral Q24H  . Chlorhexidine Gluconate Cloth  6 each Topical Q0600  . clopidogrel  75 mg Oral Daily  . docusate sodium  100 mg Oral BID  . donepezil  10 mg Oral QHS  . enoxaparin (LOVENOX) injection  30 mg Subcutaneous Q24H  . escitalopram  5 mg Oral Daily  . levothyroxine  100 mcg Oral QAC breakfast  . mupirocin ointment  1 application Nasal BID  . pantoprazole  40 mg Oral Daily  . saccharomyces boulardii  250 mg Oral Daily   Continuous Infusions: . dextrose 5 % and 0.45% NaCl 50 mL/hr at 10/25/16 1210  . meropenem (MERREM) IV 1 g (10/25/16 1145)     LOS: 0 days    Time spent: 29 m    Waldron Session, MD [facility] Pager [phone]  If 7PM-7AM, please contact night-coverage [email or web address] Password  [facility] 10/25/2016, 12:58 PM

## 2016-10-25 NOTE — Progress Notes (Signed)
PHARMACY - PHYSICIAN COMMUNICATION CRITICAL VALUE ALERT - BLOOD CULTURE IDENTIFICATION (BCID)  Results for orders placed or performed during the hospital encounter of 10/24/16  Blood Culture ID Panel (Reflexed) (Collected: 10/24/2016 12:14 PM)  Result Value Ref Range   Enterococcus species NOT DETECTED NOT DETECTED   Listeria monocytogenes NOT DETECTED NOT DETECTED   Staphylococcus species NOT DETECTED NOT DETECTED   Staphylococcus aureus NOT DETECTED NOT DETECTED   Streptococcus species NOT DETECTED NOT DETECTED   Streptococcus agalactiae NOT DETECTED NOT DETECTED   Streptococcus pneumoniae NOT DETECTED NOT DETECTED   Streptococcus pyogenes NOT DETECTED NOT DETECTED   Acinetobacter baumannii NOT DETECTED NOT DETECTED   Enterobacteriaceae species NOT DETECTED NOT DETECTED   Enterobacter cloacae complex NOT DETECTED NOT DETECTED   Escherichia coli NOT DETECTED NOT DETECTED   Klebsiella oxytoca NOT DETECTED NOT DETECTED   Klebsiella pneumoniae NOT DETECTED NOT DETECTED   Proteus species NOT DETECTED NOT DETECTED   Serratia marcescens NOT DETECTED NOT DETECTED   Carbapenem resistance NOT DETECTED NOT DETECTED   Haemophilus influenzae NOT DETECTED NOT DETECTED   Neisseria meningitidis NOT DETECTED NOT DETECTED   Pseudomonas aeruginosa DETECTED (A) NOT DETECTED   Candida albicans NOT DETECTED NOT DETECTED   Candida glabrata NOT DETECTED NOT DETECTED   Candida krusei NOT DETECTED NOT DETECTED   Candida parapsilosis NOT DETECTED NOT DETECTED   Candida tropicalis NOT DETECTED NOT DETECTED    Name of physician (or Provider) Contacted: Sabia  Changes to prescribed antibiotics required: Switch Ceftriaxone to Meropenem  Susa Raring, PharmD PGY2 Infectious Diseases Pharmacy Resident  Pager: [phone] 10/25/2016  10:02 AM

## 2016-10-25 NOTE — Care Management Obs Status (Signed)
MEDICARE OBSERVATION STATUS NOTIFICATION   Patient Details  Name: [name] MRN: [id number] Date of Birth: 1930-07-22   Medicare Observation Status Notification Given:  Yes    Azizi Bally, Rory Percy, RN 10/25/2016, 4:57 PM

## 2016-10-25 NOTE — Progress Notes (Signed)
OT Cancellation Note and Discharge  Patient Details Name: Kaliel Bolds Kasal MRN: [id number] DOB: 1931/02/28   Cancelled Treatment:    Reason Eval/Treat Not Completed: OT screened, no needs identified, will sign off. Noted in chart that pt has been bed bound since 2016 after a hip fracture, in to see pt with dtr in room and she reports pt is total care, is non mobile and only gets out of bed for a shower occasionally and her brother lifts him in and out. No skilled needs identified and she agrees that pt is not a candidate for OT or PT--will make PT aware.  Almon Register [phone] 10/25/2016, 8:34 AM

## 2016-10-25 NOTE — Progress Notes (Signed)
PT Cancellation Note  Patient Details Name: [name] MRN: [id number] DOB: July 18, 1930   Cancelled Treatment:    Reason Eval/Treat Not Completed: Other (comment).  Wife has declined therapy as pt is bedbound at baseline.   Ramond Dial 10/25/2016, 9:31 AM   Mee Hives, PT MS Acute Rehab Dept. Number: [place] and [place]

## 2016-10-25 NOTE — Progress Notes (Signed)
Patient began coughing while drinking water during med administration, swallowing multiple times. Patient is lethargic, responding to voice only.MD paged, orders placed to remain NPO until speech eval is performed.

## 2016-10-25 NOTE — Progress Notes (Signed)
Pharmacy Antibiotic Note  [name] is a 83 y.o. male admitted on 10/24/2016 with UTI/Pseudomonas bacteremia. Patient was admitted yesterday and empirically started on ceftriaxone and azithromycin for pneumonia. Patient is now growing Pseudomonas in 1/2 blood cultures.  Pharmacy has been consulted for meropenem dosing. Patient has a history of pseudomonas UTIs that have been intermediate to ceftazidime but sensitive to cefepime, carbapenems, and fluoroquinolones. WBC today are elevated at 22.8 and Tmax24 101.2. Scr is elevated at 1.77 with CrCl~ 21.1 ml/min.   Plan: Meropenem 1 gm every 12 hours   Monitor cultures, renal function, and clinical s/sx of infection.    Height: 5\' 8"  (172.7 cm) Weight: 104 lb (47.2 kg) IBW/kg (Calculated) : 68.4  Temp (24hrs), Avg:99.5 F (37.5 C), Min:98.6 F (37 C), Max:101.2 F (38.4 C)   Recent Labs Lab 10/24/16 0647 10/25/16 0509  WBC 13.9* 22.8*  CREATININE 1.11 1.77*    Estimated Creatinine Clearance: 21.1 mL/min (A) (by C-G formula based on SCr of 1.77 mg/dL (H)).    Allergies  Allergen Reactions  . Sulfa Antibiotics Hives  . Valtrex [Valacyclovir Hcl] Nausea And Vomiting    NV    Antimicrobials this admission: 7/23 Azith>> 7/23 CTX >>7/24 7/23 Meropenem>>   Microbiology results: 7/23  BCx: 1/2 Gm negative rods (BCID shows Pseudomonas)    Thank you for allowing pharmacy to be a part of this patient's care.  Susa Raring, PharmD PGY2 Infectious Diseases Pharmacy Resident Pager: [phone] 10/25/2016 10:09 AM

## 2016-10-26 DIAGNOSIS — I712 Thoracic aortic aneurysm, without rupture: Secondary | ICD-10-CM

## 2016-10-26 DIAGNOSIS — E039 Hypothyroidism, unspecified: Secondary | ICD-10-CM

## 2016-10-26 DIAGNOSIS — N390 Urinary tract infection, site not specified: Secondary | ICD-10-CM

## 2016-10-26 DIAGNOSIS — F0390 Unspecified dementia without behavioral disturbance: Secondary | ICD-10-CM

## 2016-10-26 DIAGNOSIS — Z8673 Personal history of transient ischemic attack (TIA), and cerebral infarction without residual deficits: Secondary | ICD-10-CM

## 2016-10-26 DIAGNOSIS — R319 Hematuria, unspecified: Secondary | ICD-10-CM

## 2016-10-26 DIAGNOSIS — J181 Lobar pneumonia, unspecified organism: Principal | ICD-10-CM

## 2016-10-26 LAB — COMPREHENSIVE METABOLIC PANEL
ALT: 10 U/L — ABNORMAL LOW (ref 17–63)
ANION GAP: 5 (ref 5–15)
AST: 15 U/L (ref 15–41)
Albumin: 2.2 g/dL — ABNORMAL LOW (ref 3.5–5.0)
Alkaline Phosphatase: 59 U/L (ref 38–126)
BUN: 38 mg/dL — ABNORMAL HIGH (ref 6–20)
CALCIUM: 7.8 mg/dL — AB (ref 8.9–10.3)
CHLORIDE: 110 mmol/L (ref 101–111)
CO2: 25 mmol/L (ref 22–32)
Creatinine, Ser: 1.54 mg/dL — ABNORMAL HIGH (ref 0.61–1.24)
GFR, EST AFRICAN AMERICAN: 46 mL/min — AB (ref >60)
GFR, EST NON AFRICAN AMERICAN: 40 mL/min — AB (ref >60)
Glucose, Bld: 126 mg/dL — ABNORMAL HIGH (ref 65–99)
Potassium: 3.9 mmol/L (ref 3.5–5.1)
SODIUM: 140 mmol/L (ref 135–145)
Total Bilirubin: 0.6 mg/dL (ref 0.3–1.2)
Total Protein: 4.8 g/dL — ABNORMAL LOW (ref 6.5–8.1)

## 2016-10-26 LAB — CBC
HCT: 33.1 % — ABNORMAL LOW (ref 39.0–52.0)
Hemoglobin: 10.7 g/dL — ABNORMAL LOW (ref 13.0–17.0)
MCH: 28.8 pg (ref 26.0–34.0)
MCHC: 32.3 g/dL (ref 30.0–36.0)
MCV: 89 fL (ref 78.0–100.0)
PLATELETS: 163 10*3/uL (ref 150–400)
RBC: 3.72 MIL/uL — ABNORMAL LOW (ref 4.22–5.81)
RDW: 15.7 % — ABNORMAL HIGH (ref 11.5–15.5)
WBC: 15.9 10*3/uL — ABNORMAL HIGH (ref 4.0–10.5)

## 2016-10-26 NOTE — Consult Note (Signed)
Urology Consult   Physician requesting consult: Dr. Dyann Kief  Reason for consult: Hematuria  History of Present Illness: [name] is a 83 y.o. previously followed by Dr. Gaynelle Arabian for a right renal mass that has demonstrated minimal growth over multiple years suggesting either benign pathology or very low grade malignancy.  This has been managed with observation/surveillance.  Mr. Wollman had an episode of painless gross hematuria a few weeks ago and apparently was diagnosed with a UTI (culture was multiple organisms suggesting possible contamination) and treated with antibiotics.  He was admitted to the hospital yesterday with mental status changes and has been found to have a positive blood culture for Pseudomonas.  His urinalysis did not demonstrate any bacteruria but he did have gross and microscopic hematuria (TNC).  His wife states that his urine was dark red since Sunday night and now clearing. He has not described any pain although has dementia and is not a reliable historian (but his wife does not describe him indicating any pain).  He has a history of a stroke and is on Plavix chronically.  He is incontinent since his stroke and currently has a condom catheter.  He is non-ambulatory due to prior orthopedic trauma.  He has no history of smoking.  He does have a history of kidney stones.    Past Medical History:  Diagnosis Date  . BPH (benign prostatic hypertrophy)   . Dementia   . Diverticulosis of colon   . History of shingles   . Hypertension   . Hypothyroidism   . Multiple fractures   . Pancreatitis 2011   and gallstone pancreatitis and Ecoli bacteremia  . Pneumonia    2016  . Rabbit fever 1940  . Renal mass 2011   Eval by Dr Tannenbaum echogenic mass on u/s. followed by Uro- observation as of 09/2010  . Stroke ([facility]) 02/2015   with L sided weakness.     Past Surgical History:  Procedure Laterality Date  . APPENDECTOMY  1959  . CHOLECYSTECTOMY  10/2009  . EYE MUSCLE  SURGERY     12 /12 had double vision Dr. Juleen China at [facility]  . JOINT REPLACEMENT  04/30/2001   bilateral TKR  . KNEE ARTHROSCOPY  06/1995   left Dr Derrel Nip  . OPEN REDUCTION INTERNAL FIXATION (ORIF) DISTAL RADIAL FRACTURE Left 03/16/2015   Procedure: OPEN REDUCTION INTERNAL FIXATION (ORIF) DISTAL RADIAL FRACTURE;  Surgeon: Iran Planas, MD;  Location: [place];  Service: Orthopedics;  Laterality: Left;  . ORIF FEMUR FRACTURE Left 03/16/2015   Procedure: OPEN REDUCTION INTERNAL FIXATION (ORIF) DISTAL FEMUR FRACTURE;  Surgeon: Paralee Cancel, MD;  Location: [place];  Service: Orthopedics;  Laterality: Left;  . ORIF HIP FRACTURE Left 12/26/2014   Procedure: OPEN REDUCTION INTERNAL FIXATION HIP;  Surgeon: Paralee Cancel, MD;  Location: [facility];  Service: Orthopedics;  Laterality: Left;  . THYROIDECTOMY, PARTIAL  1966    Current Hospital Medications:  Home Meds:  Current Meds  Medication Sig  . B Complex-C (B-COMPLEX WITH VITAMIN C) tablet Take 1 tablet by mouth daily.  . clopidogrel (PLAVIX) 75 MG tablet Take 1 tablet by mouth daily  . docusate sodium (COLACE) 100 MG capsule Take 100 mg by mouth 2 (two) times daily.  Marland Kitchen donepezil (ARICEPT) 10 MG tablet Take 1 tablet (10 mg total) by mouth daily. (Patient taking differently: Take 10 mg by mouth at bedtime. )  . escitalopram (LEXAPRO) 5 MG tablet Take 1 tablet (5 mg total) by mouth daily. (Patient taking differently:  Take 5 mg by mouth every morning. )  . IRON PO Take 1 tablet by mouth 2 (two) times daily.   Marland Kitchen levothyroxine (SYNTHROID, LEVOTHROID) 100 MCG tablet TAKE 100 MCG BY MOUTH DAILY  . Multiple Vitamin (MULTIVITAMIN) tablet Take 1 tablet by mouth daily.    . ondansetron (ZOFRAN) 4 MG tablet Take 1 tablet (4 mg total) by mouth every 8 (eight) hours as needed for nausea or vomiting.  . pantoprazole (PROTONIX) 40 MG tablet Take 1 tablet by mouth daily  . polyethylene glycol (MIRALAX / GLYCOLAX) packet Take 17 g by mouth daily. (Patient taking differently:  Take 17 g by mouth daily as needed for mild constipation. )  . Probiotic Product (PROBIOTIC PO) Take 1 tablet by mouth daily.  . RaNITidine HCl (ACID CONTROL PO) Take 2 tablets by mouth daily as needed (acid).    Scheduled Meds: . Chlorhexidine Gluconate Cloth  6 each Topical Q0600  . clopidogrel  75 mg Oral Daily  . docusate sodium  100 mg Oral BID  . donepezil  10 mg Oral QHS  . enoxaparin (LOVENOX) injection  30 mg Subcutaneous Q24H  . escitalopram  5 mg Oral Daily  . levothyroxine  100 mcg Oral QAC breakfast  . mupirocin ointment  1 application Nasal BID  . pantoprazole  40 mg Oral Daily  . saccharomyces boulardii  250 mg Oral Daily   Continuous Infusions: . dextrose 5 % and 0.45% NaCl 75 mL/hr at 10/26/16 1436  . meropenem (MERREM) IV Stopped (10/26/16 1040)   PRN Meds:.acetaminophen **OR** acetaminophen, gi cocktail, haloperidol lactate, ondansetron **OR** ondansetron (ZOFRAN) IV, ranitidine  Allergies:  Allergies  Allergen Reactions  . Sulfa Antibiotics Hives  . Valtrex [Valacyclovir Hcl] Nausea And Vomiting    NV    Family History  Problem Relation Age of Onset  . Hypertension Mother   . Stroke Mother   . Alcohol abuse Father   . Cancer Brother        prostate CA  . Heart disease Brother     Social History:  reports that he has never smoked. He has never used smokeless tobacco. He reports that he does not drink alcohol or use drugs.  ROS: A complete review of systems was performed.  All systems are negative except for pertinent findings as noted although the patient is unable to provide adequate responses to most questions.  His wife answered most of the questions.  Physical Exam:  Vital signs in last 24 hours: Temp:  [97.8 F (36.6 C)-98.7 F (37.1 C)] 98.7 F (37.1 C) (07/25 1613) Pulse Rate:  [55-74] 72 (07/25 1613) Resp:  [16-18] 18 (07/25 1613) BP: (100-113)/(57-64) 102/64 (07/25 1613) SpO2:  [92 %-97 %] 96 % (07/25 1613) Weight:  [47.6 kg (105  lb)] 47.6 kg (105 lb) (07/24 2112) Constitutional:  Sleeping and difficult to arouse, No acute distress Cardiovascular: Regular rate and rhythm, No JVD Respiratory: Normal respiratory effort GI: Abdomen is soft, nontender, nondistended, no abdominal masses GU: No CVA tenderness, Condom catheter in place with tea colored urine, draining well. Lymphatic: No lymphadenopathy Neurologic: Grossly intact, no focal deficits Psychiatric: Normal mood and affect  Laboratory Data:   Recent Labs  10/24/16 0647 10/25/16 0509 10/26/16 0702  WBC 13.9* 22.8* 15.9*  HGB 13.5 12.0* 10.7*  HCT 41.2 37.6* 33.1*  PLT 261 201 163     Recent Labs  10/24/16 0647 10/25/16 0509 10/26/16 0702  NA 142 137 140  K 4.2 4.6 3.9  CL 107  103 110  GLUCOSE 117* 128* 126*  BUN 24* 34* 38*  CALCIUM 8.9 8.6* 7.8*  CREATININE 1.11 1.77* 1.54*     Results for orders placed or performed during the hospital encounter of 10/24/16 (from the past 24 hour(s))  CBC     Status: Abnormal   Collection Time: 10/26/16  7:02 AM  Result Value Ref Range   WBC 15.9 (H) 4.0 - 10.5 K/uL   RBC 3.72 (L) 4.22 - 5.81 MIL/uL   Hemoglobin 10.7 (L) 13.0 - 17.0 g/dL   HCT 33.1 (L) 39.0 - 52.0 %   MCV 89.0 78.0 - 100.0 fL   MCH 28.8 26.0 - 34.0 pg   MCHC 32.3 30.0 - 36.0 g/dL   RDW 15.7 (H) 11.5 - 15.5 %   Platelets 163 150 - 400 K/uL  Comprehensive metabolic panel     Status: Abnormal   Collection Time: 10/26/16  7:02 AM  Result Value Ref Range   Sodium 140 135 - 145 mmol/L   Potassium 3.9 3.5 - 5.1 mmol/L   Chloride 110 101 - 111 mmol/L   CO2 25 22 - 32 mmol/L   Glucose, Bld 126 (H) 65 - 99 mg/dL   BUN 38 (H) 6 - 20 mg/dL   Creatinine, Ser 1.54 (H) 0.61 - 1.24 mg/dL   Calcium 7.8 (L) 8.9 - 10.3 mg/dL   Total Protein 4.8 (L) 6.5 - 8.1 g/dL   Albumin 2.2 (L) 3.5 - 5.0 g/dL   AST 15 15 - 41 U/L   ALT 10 (L) 17 - 63 U/L   Alkaline Phosphatase 59 38 - 126 U/L   Total Bilirubin 0.6 0.3 - 1.2 mg/dL   GFR calc non Af Amer  40 (L) >60 mL/min   GFR calc Af Amer 46 (L) >60 mL/min   Anion gap 5 5 - 15   Recent Results (from the past 240 hour(s))  Culture, blood (routine x 2) Call MD if unable to obtain prior to antibiotics being given     Status: Abnormal (Preliminary result)   Collection Time: 10/24/16 12:08 PM  Result Value Ref Range Status   Specimen Description BLOOD LEFT ANTECUBITAL  Final   Special Requests IN PEDIATRIC BOTTLE Blood Culture adequate volume  Final   Culture  Setup Time   Final    GRAM NEGATIVE RODS IN PEDIATRIC BOTTLE CRITICAL VALUE NOTED.  VALUE IS CONSISTENT WITH PREVIOUSLY REPORTED AND CALLED VALUE.    Culture PSEUDOMONAS AERUGINOSA (A)  Final   Report Status PENDING  Incomplete  Culture, blood (routine x 2) Call MD if unable to obtain prior to antibiotics being given     Status: Abnormal (Preliminary result)   Collection Time: 10/24/16 12:14 PM  Result Value Ref Range Status   Specimen Description BLOOD RIGHT ANTECUBITAL  Final   Special Requests IN PEDIATRIC BOTTLE Blood Culture adequate volume  Final   Culture  Setup Time   Final    GRAM NEGATIVE RODS IN PEDIATRIC BOTTLE CRITICAL RESULT CALLED TO, READ BACK BY AND VERIFIED WITH: EVernona Rieger Pharm.D. 10:00 10/25/16 ([email or web address])    Culture (A)  Final    PSEUDOMONAS AERUGINOSA SUSCEPTIBILITIES TO FOLLOW    Report Status PENDING  Incomplete  Blood Culture ID Panel (Reflexed)     Status: Abnormal   Collection Time: 10/24/16 12:14 PM  Result Value Ref Range Status   Enterococcus species NOT DETECTED NOT DETECTED Final   Listeria monocytogenes NOT DETECTED NOT DETECTED Final   Staphylococcus species NOT DETECTED  NOT DETECTED Final   Staphylococcus aureus NOT DETECTED NOT DETECTED Final   Streptococcus species NOT DETECTED NOT DETECTED Final   Streptococcus agalactiae NOT DETECTED NOT DETECTED Final   Streptococcus pneumoniae NOT DETECTED NOT DETECTED Final   Streptococcus pyogenes NOT DETECTED NOT DETECTED Final   Acinetobacter  baumannii NOT DETECTED NOT DETECTED Final   Enterobacteriaceae species NOT DETECTED NOT DETECTED Final   Enterobacter cloacae complex NOT DETECTED NOT DETECTED Final   Escherichia coli NOT DETECTED NOT DETECTED Final   Klebsiella oxytoca NOT DETECTED NOT DETECTED Final   Klebsiella pneumoniae NOT DETECTED NOT DETECTED Final   Proteus species NOT DETECTED NOT DETECTED Final   Serratia marcescens NOT DETECTED NOT DETECTED Final   Carbapenem resistance NOT DETECTED NOT DETECTED Final   Haemophilus influenzae NOT DETECTED NOT DETECTED Final   Neisseria meningitidis NOT DETECTED NOT DETECTED Final   Pseudomonas aeruginosa DETECTED (A) NOT DETECTED Final    Comment: CRITICAL RESULT CALLED TO, READ BACK BY AND VERIFIED WITH: E. Vernona Rieger Pharm.D. 10:00 10/25/16 ([email or web address])    Candida albicans NOT DETECTED NOT DETECTED Final   Candida glabrata NOT DETECTED NOT DETECTED Final   Candida krusei NOT DETECTED NOT DETECTED Final   Candida parapsilosis NOT DETECTED NOT DETECTED Final   Candida tropicalis NOT DETECTED NOT DETECTED Final  MRSA PCR Screening     Status: Abnormal   Collection Time: 10/24/16 12:29 PM  Result Value Ref Range Status   MRSA by PCR POSITIVE (A) NEGATIVE Final    Comment:        The GeneXpert MRSA Assay (FDA approved for NASAL specimens only), is one component of a comprehensive MRSA colonization surveillance program. It is not intended to diagnose MRSA infection nor to guide or monitor treatment for MRSA infections. RESULT CALLED TO, READ BACK BY AND VERIFIED WITH: Willadean Carol RN 15:50 10/24/16 ([email or web address])     Renal Function:  Recent Labs  10/24/16 0647 10/25/16 0509 10/26/16 0702  CREATININE 1.11 1.77* 1.54*   Estimated Creatinine Clearance: 24.5 mL/min (A) (by C-G formula based on SCr of 1.54 mg/dL (H)).  Radiologic Imaging:   Impression/Recommendation 1) Gross hematuria: No obvious explanation.  His UA did not appear infected upon admission.  No urine culture  was obtained, however.  I would recommend he undergo a hematuria evaluation including a hematuria protocol CT scan with and without contrast.  His baseline renal function is normal  Will wait until tomorrow as his SCr is improving.  He also will need subsequent cystoscopy that can be performed as an outpatient.  2) Right renal mass:  This will be further evaluated on his CT scan.  3) Pseudomonas bacteremia: No clear evidence to suggest a urinary tract source based on UA on admission although he has had prior Pseudomonas UTIs.  Unfortunately, no urine culture was obtained to correlate to blood cultures.   Tyah Acord,LES 10/26/2016, 5:31 PM    Pryor Curia MD   CC: Dr. Dyann Kief

## 2016-10-26 NOTE — Consult Note (Signed)
   [facility] Inpatient Consult   10/26/2016  Eva Vallee Schoffstall 04/15/1930 [id number]    Mr. Felch screened for [place] Management program on behalf of his [employer] program. Spoke with Mrs. Gonzaga at bedside to offer [place] Management program. She pleasantly declines [facility] Care Management stating that " I have been taking care of him quite some time, I know what to do".    Confirms Primary Care MD is Dr. Damita Dunnings. Denies any concerns with medications or with transportation.   Provided [facility] Care Management brochure with contact information and 24-hr nurse magnet.   Made covering inpatient RNCM aware.    Marthenia Rolling, MSN-Ed, RN,BSN [facility] Liaison [phone]

## 2016-10-26 NOTE — Evaluation (Signed)
Clinical/Bedside Swallow Evaluation Patient Details  Name: [name] MRN: [id number] Date of Birth: May 06, 1930  Today's Date: 10/26/2016 Time: SLP Start Time (ACUTE ONLY): 1216 SLP Stop Time (ACUTE ONLY): 1233 SLP Time Calculation (min) (ACUTE ONLY): 17 min  Past Medical History:  Past Medical History:  Diagnosis Date  . BPH (benign prostatic hypertrophy)   . Dementia   . Diverticulosis of colon   . History of shingles   . Hypertension   . Hypothyroidism   . Multiple fractures   . Pancreatitis 2011   and gallstone pancreatitis and Ecoli bacteremia  . Pneumonia    2016  . Rabbit fever 1940  . Renal mass 2011   Eval by [name] echogenic mass on u/s. followed by [name]- observation as of 09/2010  . Stroke ([place]) 02/2015   with L sided weakness.    Past Surgical History:  Past Surgical History:  Procedure Laterality Date  . APPENDECTOMY  1959  . CHOLECYSTECTOMY  10/2009  . EYE MUSCLE SURGERY     12/12 had double vision Dr. Juleen China at [facility]  . JOINT REPLACEMENT  04/30/2001   bilateral TKR  . KNEE ARTHROSCOPY  06/1995   left Dr Derrel Nip  . OPEN REDUCTION INTERNAL FIXATION (ORIF) DISTAL RADIAL FRACTURE Left 03/16/2015   Procedure: OPEN REDUCTION INTERNAL FIXATION (ORIF) DISTAL RADIAL FRACTURE;  Surgeon: Iran Planas, MD;  Location: [place];  Service: Orthopedics;  Laterality: Left;  . ORIF FEMUR FRACTURE Left 03/16/2015   Procedure: OPEN REDUCTION INTERNAL FIXATION (ORIF) DISTAL FEMUR FRACTURE;  Surgeon: Paralee Cancel, MD;  Location: [place];  Service: Orthopedics;  Laterality: Left;  . ORIF HIP FRACTURE Left 12/26/2014   Procedure: OPEN REDUCTION INTERNAL FIXATION HIP;  Surgeon: Paralee Cancel, MD;  [facility];  Service: Orthopedics;  Laterality: Left;  . THYROIDECTOMY, PARTIAL  1966   HPI:  [name] a 83 y.o.malewith medical history significant of BPH, dementia, diverticolosis, HTN, Hypothyroid, Pancreatitis, CVA w/ L sided weakness and pna. Admitted with chest  pain. Per chart patient's wife thinks that his symptoms are likely related to reflux as he does have this from time to time and symptoms slowly resolved after antacids. CXR 7/23 minimal right basilar subsegmental atelectasis. Mildly increased left basilar opacity concerning for atelectasis or infiltrate with associated pleural effusion.    Assessment / Plan / Recommendation Clinical Impression  Suspect pt has primary esophageal dysphagia mixed with intermittent oropharyngeal deficits given history of dementia, report from pt's wife, difficulty manipulating pill and liquids, pt moaning and hand to chest once reclined after po consumption. Immediate and mild throat clear with thin via straw prevented with cup sips. Mildly prolonged mastication and transit with solid. Recommend Dys 3, thin liquids, no straws, pills whole in applesauce and full supervision. Once pt is home and closer to baseline, advised pt's wife to attempt pills with thin and food textures he was consuming prior. Explained relationship between progressive dementia and dysphagia. If he exhibits difficulty, recommend to chop/mince food and place pills whole in applesauce/pudding. Recommend upright position and remain upright post meals.     SLP Visit Diagnosis: Dysphagia, unspecified (R13.10)    Aspiration Risk   (mild-mod)    Diet Recommendation Dysphagia 2 (Fine chop);Thin liquid   Liquid Administration via: Cup;No straw Medication Administration: Whole meds with puree Supervision: Patient able to self feed;Full supervision/cueing for compensatory strategies Compensations: Slow rate;Small sips/bites;Follow solids with liquid Postural Changes: Seated upright at 90 degrees;Remain upright for at least 30 minutes after po  intake    Other  Recommendations Oral Care Recommendations: Oral care BID   Follow up Recommendations None      Frequency and Duration min 2x/week  2 weeks       Prognosis Prognosis for Safe Diet Advancement:  Fair Barriers to Reach Goals: Cognitive deficits      Swallow Study   General HPI: [name] a 82 y.o.malewith medical history significant of BPH, dementia, diverticolosis, HTN, Hypothyroid, Pancreatitis, CVA w/ L sided weakness and pna. Admitted with chest pain. Per chart patient's wife thinks that his symptoms are likely related to reflux as he does have this from time to time and symptoms slowly resolved after antacids. CXR 7/23 minimal right basilar subsegmental atelectasis. Mildly increased left basilar opacity concerning for atelectasis or infiltrate with associated pleural effusion.  Type of Study: Bedside Swallow Evaluation Previous Swallow Assessment:  (none) Diet Prior to this Study: NPO Temperature Spikes Noted: No Respiratory Status: Room air History of Recent Intubation: No Behavior/Cognition: Alert;Cooperative;Requires cueing Oral Cavity Assessment: Within Functional Limits Oral Care Completed by SLP: No Oral Cavity - Dentition: Adequate natural dentition (missing some) Vision: Functional for self-feeding Self-Feeding Abilities: Able to feed self;Needs set up Patient Positioning: Upright in bed Baseline Vocal Quality: Low vocal intensity Volitional Cough: Weak Volitional Swallow: Able to elicit    Oral/Motor/Sensory Function Overall Oral Motor/Sensory Function: Within functional limits   Ice Chips Ice chips: Not tested   Thin Liquid Thin Liquid: Impaired Presentation: Straw;Cup Oral Phase Impairments: Reduced lingual movement/coordination Oral Phase Functional Implications: Right anterior spillage Pharyngeal  Phase Impairments: Throat Clearing - Immediate    Nectar Thick Nectar Thick Liquid: Not tested   Honey Thick Honey Thick Liquid: Not tested   Puree Puree: Within functional limits   Solid   GO   Solid: Impaired Oral Phase Functional Implications: Prolonged oral transit Pharyngeal Phase Impairments:  (none)        [name], [name] 10/26/2016,2:20 PM  [name] [place].Ed [employer] [phone]

## 2016-10-26 NOTE — Progress Notes (Signed)
[facility] PROGRESS NOTE  [name] HYW:[id number] DOB: 10-Marsico-1932 DOA: 10/24/2016 PCP: Tonia Ghent, MD  Interim summary and HPI 83 y.o. male with medical history significant of BPH, dementia, diverticolosis, HTN, Hypothyroid, Pancreatitis, CVA w/ L sided weakness. Presented with complaints of chest discomfort and hematuria. Per family his underlying dementia also got worse and is very confuse.  Assessment/Plan: 1-Lobar PNA: left lung -with concerns for aspiration -will continue meropenem  -speech pending to see patient  -continue supportive care and IVF's  2-UTI: patient with hematuria for 2 days at home -now with frank hematuria here -started on meropenem  -cultures were not taken  -will follow clinical response -foley inserted -urology consulted   3-chest pain  -no acute ischemic changes on EKG or telemetry -symptoms essentially resolved with use of PPI -will continue PRN GI cocktail  -troponin neg  4-hx of CVA -will hold aspirin and plavix in presence of acute hematuria  5-bed bound at baseline -since 2016 after left hip and femur fracture -ulcers prevention -supportive care  6-hx of dementia/depression -will continue aricept and lexapro  7-hypothyroidism -will continue synthroid   8-GERD -continue Zantac and PPI -PRN GI cocktail ordered  9-ascending aortic aneurysm -4 cm on CTA -will benefit of outpatient follow up -no signs of rupture or dissection   10-pseudomonas bacteremia -started on meropenem -will follow cx and sensitivity -no fever and non-septic appearance  11-dysphagia -will follow rec's by speech therapy  Code Status: DNR/DNI Family Communication: daughter and wife at bedside  Disposition Plan: continue IV antibiotics, follow cx's; follow urology rec's.   Consultants:  Urology   Procedures:  See below for x-ray reports   Antibiotics:  Meropenem 7/24  HPI/Subjective: Afebrile, no CP, no SOB today. Patient  with some difficulty swallowing overnight.   Objective: Vitals:   10/26/16 0641 10/26/16 0922  BP: 103/62 113/61  Pulse: 68 (!) 55  Resp: 16 17  Temp: 98.2 F (36.8 C) 97.8 F (36.6 C)    Intake/Output Summary (Last 24 hours) at 10/26/16 1335 Last data filed at 10/26/16 1055  Gross per 24 hour  Intake                0 ml  Output              500 ml  Net             -500 ml   Filed Weights   10/24/16 1130 10/24/16 2123 10/25/16 2112  Weight: 56.7 kg (125 lb) 47.2 kg (104 lb) 47.6 kg (105 lb)    Exam:   General:  Afebrile, no CP, no SOB. Positive hematuria seen. Still confused as per family report; even interacting more.   Cardiovascular: S1 and S2, no rubs, no gallops  Respiratory: good air movement, no using accessory muscles, no wheezing. Positive rhonchi on exam  Abdomen: soft, NT, ND, positive BS  Musculoskeletal: no edema, no cyanosis, positive atrophy on his legs appreciated.  Data Reviewed: Basic Metabolic Panel:  Recent Labs Lab 10/24/16 0647 10/25/16 0509 10/26/16 0702  NA 142 137 140  K 4.2 4.6 3.9  CL 107 103 110  CO2 27 26 25   GLUCOSE 117* 128* 126*  BUN 24* 34* 38*  CREATININE 1.11 1.77* 1.54*  CALCIUM 8.9 8.6* 7.8*   Liver Function Tests:  Recent Labs Lab 10/26/16 0702  AST 15  ALT 10*  ALKPHOS 59  BILITOT 0.6  PROT 4.8*  ALBUMIN 2.2*   CBC:  Recent Labs Lab 10/24/16  3976 10/25/16 0509 10/26/16 0702  WBC 13.9* 22.8* 15.9*  HGB 13.5 12.0* 10.7*  HCT 41.2 37.6* 33.1*  MCV 88.8 89.3 89.0  PLT 261 201 163   Cardiac Enzymes:  Recent Labs Lab 10/24/16 1208  TROPONINI <0.03  <0.03   CBG: No results for input(s): GLUCAP in the last 168 hours.  Recent Results (from the past 240 hour(s))  Culture, blood (routine x 2) Call MD if unable to obtain prior to antibiotics being given     Status: Abnormal (Preliminary result)   Collection Time: 10/24/16 12:08 PM  Result Value Ref Range Status   Specimen Description BLOOD LEFT  ANTECUBITAL  Final   Special Requests IN PEDIATRIC BOTTLE Blood Culture adequate volume  Final   Culture  Setup Time   Final    GRAM NEGATIVE RODS IN PEDIATRIC BOTTLE CRITICAL VALUE NOTED.  VALUE IS CONSISTENT WITH PREVIOUSLY REPORTED AND CALLED VALUE.    Culture PSEUDOMONAS AERUGINOSA (A)  Final   Report Status PENDING  Incomplete  Culture, blood (routine x 2) Call MD if unable to obtain prior to antibiotics being given     Status: Abnormal (Preliminary result)   Collection Time: 10/24/16 12:14 PM  Result Value Ref Range Status   Specimen Description BLOOD RIGHT ANTECUBITAL  Final   Special Requests IN PEDIATRIC BOTTLE Blood Culture adequate volume  Final   Culture  Setup Time   Final    GRAM NEGATIVE RODS IN PEDIATRIC BOTTLE CRITICAL RESULT CALLED TO, READ BACK BY AND VERIFIED WITH: EVernona Rieger Pharm.D. 10:00 10/25/16 ([email or web address])    Culture (A)  Final    PSEUDOMONAS AERUGINOSA SUSCEPTIBILITIES TO FOLLOW    Report Status PENDING  Incomplete  Blood Culture ID Panel (Reflexed)     Status: Abnormal   Collection Time: 10/24/16 12:14 PM  Result Value Ref Range Status   Enterococcus species NOT DETECTED NOT DETECTED Final   Listeria monocytogenes NOT DETECTED NOT DETECTED Final   Staphylococcus species NOT DETECTED NOT DETECTED Final   Staphylococcus aureus NOT DETECTED NOT DETECTED Final   Streptococcus species NOT DETECTED NOT DETECTED Final   Streptococcus agalactiae NOT DETECTED NOT DETECTED Final   Streptococcus pneumoniae NOT DETECTED NOT DETECTED Final   Streptococcus pyogenes NOT DETECTED NOT DETECTED Final   Acinetobacter baumannii NOT DETECTED NOT DETECTED Final   Enterobacteriaceae species NOT DETECTED NOT DETECTED Final   Enterobacter cloacae complex NOT DETECTED NOT DETECTED Final   Escherichia coli NOT DETECTED NOT DETECTED Final   Klebsiella oxytoca NOT DETECTED NOT DETECTED Final   Klebsiella pneumoniae NOT DETECTED NOT DETECTED Final   Proteus species NOT DETECTED  NOT DETECTED Final   Serratia marcescens NOT DETECTED NOT DETECTED Final   Carbapenem resistance NOT DETECTED NOT DETECTED Final   Haemophilus influenzae NOT DETECTED NOT DETECTED Final   Neisseria meningitidis NOT DETECTED NOT DETECTED Final   Pseudomonas aeruginosa DETECTED (A) NOT DETECTED Final    Comment: CRITICAL RESULT CALLED TO, READ BACK BY AND VERIFIED WITH: E. Vernona Rieger Pharm.D. 10:00 10/25/16 ([email or web address])    Candida albicans NOT DETECTED NOT DETECTED Final   Candida glabrata NOT DETECTED NOT DETECTED Final   Candida krusei NOT DETECTED NOT DETECTED Final   Candida parapsilosis NOT DETECTED NOT DETECTED Final   Candida tropicalis NOT DETECTED NOT DETECTED Final  MRSA PCR Screening     Status: Abnormal   Collection Time: 10/24/16 12:29 PM  Result Value Ref Range Status   MRSA by PCR POSITIVE (A) NEGATIVE Final    Comment:  The GeneXpert MRSA Assay (FDA approved for NASAL specimens only), is one component of a comprehensive MRSA colonization surveillance program. It is not intended to diagnose MRSA infection nor to guide or monitor treatment for MRSA infections. RESULT CALLED TO, READ BACK BY AND VERIFIED WITH: Willadean Carol RN 15:50 10/24/16 ([email or web address])      Studies: No results found.  Scheduled Meds: . Chlorhexidine Gluconate Cloth  6 each Topical Q0600  . clopidogrel  75 mg Oral Daily  . docusate sodium  100 mg Oral BID  . donepezil  10 mg Oral QHS  . enoxaparin (LOVENOX) injection  30 mg Subcutaneous Q24H  . escitalopram  5 mg Oral Daily  . levothyroxine  100 mcg Oral QAC breakfast  . mupirocin ointment  1 application Nasal BID  . pantoprazole  40 mg Oral Daily  . saccharomyces boulardii  250 mg Oral Daily   Continuous Infusions: . dextrose 5 % and 0.45% NaCl 50 mL/hr at 10/25/16 1351  . meropenem (MERREM) IV 1 g (10/26/16 1010)    Active Problems:   Barrett's esophagus   Dementia   Lobar pneumonia (HCC)   Acute lower UTI   Atypical chest pain    History of CVA (cerebrovascular accident)   Hypothyroid   Thoracic aortic aneurysm without rupture ([place])   UTI (urinary tract infection)    Time spent: 30 minutes    Barton Dubois  [facility] Pager [phone]. If 7PM-7AM, please contact night-coverage at [email or web address], password [email or web address] 10/26/2016, 1:35 PM  LOS: 1 day

## 2016-10-27 ENCOUNTER — Inpatient Hospital Stay (HOSPITAL_COMMUNITY): Payer: PPO

## 2016-10-27 DIAGNOSIS — R079 Chest pain, unspecified: Secondary | ICD-10-CM

## 2016-10-27 LAB — CBC
HCT: 34.9 % — ABNORMAL LOW (ref 39.0–52.0)
Hemoglobin: 11.4 g/dL — ABNORMAL LOW (ref 13.0–17.0)
MCH: 28.8 pg (ref 26.0–34.0)
MCHC: 32.7 g/dL (ref 30.0–36.0)
MCV: 88.1 fL (ref 78.0–100.0)
PLATELETS: 170 10*3/uL (ref 150–400)
RBC: 3.96 MIL/uL — ABNORMAL LOW (ref 4.22–5.81)
RDW: 15.2 % (ref 11.5–15.5)
WBC: 12.8 10*3/uL — ABNORMAL HIGH (ref 4.0–10.5)

## 2016-10-27 LAB — BASIC METABOLIC PANEL
Anion gap: 4 — ABNORMAL LOW (ref 5–15)
BUN: 32 mg/dL — AB (ref 6–20)
CALCIUM: 7.8 mg/dL — AB (ref 8.9–10.3)
CHLORIDE: 108 mmol/L (ref 101–111)
CO2: 25 mmol/L (ref 22–32)
CREATININE: 0.96 mg/dL (ref 0.61–1.24)
GFR calc non Af Amer: >60 mL/min (ref >60)
GLUCOSE: 104 mg/dL — AB (ref 65–99)
Potassium: 3.6 mmol/L (ref 3.5–5.1)
Sodium: 137 mmol/L (ref 135–145)

## 2016-10-27 LAB — CULTURE, BLOOD (ROUTINE X 2)
SPECIAL REQUESTS: ADEQUATE
Special Requests: ADEQUATE

## 2016-10-27 MED ORDER — DOCUSATE SODIUM 50 MG/5ML PO LIQD
100.0000 mg | Freq: Two times a day (BID) | ORAL | Status: DC
  Administered 2016-10-27 – 2016-10-29 (×4): 100 mg via ORAL
  Filled 2016-10-27 (×6): qty 10

## 2016-10-27 MED ORDER — IOPAMIDOL (ISOVUE-300) INJECTION 61%
INTRAVENOUS | Status: AC
  Administered 2016-10-27: 100 mL
  Filled 2016-10-27: qty 100

## 2016-10-27 MED ORDER — ENSURE ENLIVE PO LIQD
237.0000 mL | Freq: Two times a day (BID) | ORAL | Status: DC
  Administered 2016-10-27 – 2016-10-29 (×4): 237 mL via ORAL

## 2016-10-27 MED ORDER — IOPAMIDOL (ISOVUE-300) INJECTION 61%
INTRAVENOUS | Status: AC
  Filled 2016-10-27: qty 150

## 2016-10-27 MED ORDER — CIPROFLOXACIN HCL 500 MG PO TABS: 500.0000 mg | ORAL_TABLET | Freq: Two times a day (BID) | ORAL | Status: DC

## 2016-10-27 MED ORDER — LEVOFLOXACIN 500 MG PO TABS
500.0000 mg | ORAL_TABLET | ORAL | Status: DC
  Administered 2016-10-27: 500 mg via ORAL
  Filled 2016-10-27: qty 1

## 2016-10-27 NOTE — Progress Notes (Signed)
[facility] PROGRESS NOTE  [name] KGY:[id number] DOB: 11-Mar-1931 DOA: 10/24/2016 PCP: Tonia Ghent, MD  Interim summary and HPI 83 y.o. male with medical history significant of BPH, dementia, diverticolosis, HTN, Hypothyroid, Pancreatitis, CVA w/ L sided weakness. Presented with complaints of chest discomfort and hematuria. Per family his underlying dementia also got worse and is very confuse.  Assessment/Plan: 1-Lobar PNA: left lung -with concerns for aspiration; but clear by SPL -will narrow antibiotics to levaquin, to cover bacteremia, urine and lungs -continue supportive care and IVF's; but decrease rate as patient will be eating and drinking more.  2-UTI: patient with hematuria for 2 days at home -started on meropenem on admission and now transitioned to PO levaquin  -urine cultures were not taken on admission; but clinically responding  -decisions on antibiotics has been made base on blood cultures sensitivity  -foley inserted; hematuria clearing  -urology consulted and on board; will follow rec's -CT with contrast to assess kidneys and bladder ordered.  3-chest pain  -no acute ischemic changes on EKG or telemetry -symptoms essentially resolved with use of PPI; patient has remained CP free. -will continue PRN GI cocktail  -troponin neg  4-hx of CVA -will hold aspirin and plavix in presence of acute hematuria -resume this medications once clear by urology  5-bed bound at baseline -since 2016 after left hip and femur fracture -ulcers prevention -continue supportive care  6-hx of dementia/depression -will continue aricept and lexapro -continue supportive care  7-hypothyroidism -will continue synthroid   8-GERD -continue Zantac and PPI -continue PRN GI cocktail   9-ascending aortic aneurysm -4 cm on CTA -will benefit of outpatient follow up -no signs of rupture or dissection  -patient denies abd pain  10-pseudomonas bacteremia -started on  meropenem for 2 days -follow sensitivity will transition to levaquin -no fever and non toxic appearance    11-dysphagia -patient seen by SPL -will follow rec's for dysphagia 3 and thing liquids.  12-AKI -due to UTI and pre-renal azotemia -resolved with IVF's -will monitor trend  Code Status: DNR/DNI Family Communication: daughter and wife at bedside  Disposition Plan: will switch abx's to PO levaquin, follow CT results and urology rec's. Doing much better overall today.   Consultants:  Urology   Procedures:  See below for x-ray reports   Antibiotics:  Meropenem 7/24>>7/26  levaquin 7/26  HPI/Subjective: Feeling better, no CP, no SOB, no nausea, no nausea, no vomiting.   Objective: Vitals:   10/27/16 0900 10/27/16 1649  BP: (!) 145/84 128/81  Pulse: 64 74  Resp: 19 18  Temp: 98.5 F (36.9 C) 99 F (37.2 C)    Intake/Output Summary (Last 24 hours) at 10/27/16 2043 Last data filed at 10/27/16 1903  Gross per 24 hour  Intake          3531.67 ml  Output              900 ml  Net          2631.67 ml   Filed Weights   10/24/16 2123 10/25/16 2112 10/26/16 2059  Weight: 47.2 kg (104 lb) 47.6 kg (105 lb) 56.2 kg (124 lb)    Exam:   General:  AAOX2, in no distress, denies CP and SOB. patient denies nausea and vomiting and his urine is more clear.   Cardiovascular: S1 and s2, no rubs, no gallops.  Respiratory: no wheezing, no crackles, normal resp effort. Bibasilar rhonchi appreciated.  Abdomen: soft, NT, ND, positive BS  Musculoskeletal: no edema,  no cyanosis; positive atrophy seen on his LE's (L > right)  Data Reviewed: Basic Metabolic Panel:  Recent Labs Lab 10/24/16 0647 10/25/16 0509 10/26/16 0702 10/27/16 0443  NA 142 137 140 137  K 4.2 4.6 3.9 3.6  CL 107 103 110 108  CO2 27 26 25 25   GLUCOSE 117* 128* 126* 104*  BUN 24* 34* 38* 32*  CREATININE 1.11 1.77* 1.54* 0.96  CALCIUM 8.9 8.6* 7.8* 7.8*   Liver Function Tests:  Recent  Labs Lab 10/26/16 0702  AST 15  ALT 10*  ALKPHOS 59  BILITOT 0.6  PROT 4.8*  ALBUMIN 2.2*   CBC:  Recent Labs Lab 10/24/16 0647 10/25/16 0509 10/26/16 0702 10/27/16 0443  WBC 13.9* 22.8* 15.9* 12.8*  HGB 13.5 12.0* 10.7* 11.4*  HCT 41.2 37.6* 33.1* 34.9*  MCV 88.8 89.3 89.0 88.1  PLT 261 201 163 170   Cardiac Enzymes:  Recent Labs Lab 10/24/16 1208  TROPONINI <0.03  <0.03   CBG: No results for input(s): GLUCAP in the last 168 hours.  Recent Results (from the past 240 hour(s))  Culture, blood (routine x 2) Call MD if unable to obtain prior to antibiotics being given     Status: Abnormal   Collection Time: 10/24/16 12:08 PM  Result Value Ref Range Status   Specimen Description BLOOD LEFT ANTECUBITAL  Final   Special Requests IN PEDIATRIC BOTTLE Blood Culture adequate volume  Final   Culture  Setup Time   Final    GRAM NEGATIVE RODS IN PEDIATRIC BOTTLE CRITICAL VALUE NOTED.  VALUE IS CONSISTENT WITH PREVIOUSLY REPORTED AND CALLED VALUE.    Culture (A)  Final    PSEUDOMONAS AERUGINOSA SUSCEPTIBILITIES PERFORMED ON PREVIOUS CULTURE WITHIN THE LAST 5 DAYS.    Report Status 10/27/2016 FINAL  Final  Culture, blood (routine x 2) Call MD if unable to obtain prior to antibiotics being given     Status: Abnormal   Collection Time: 10/24/16 12:14 PM  Result Value Ref Range Status   Specimen Description BLOOD RIGHT ANTECUBITAL  Final   Special Requests IN PEDIATRIC BOTTLE Blood Culture adequate volume  Final   Culture  Setup Time   Final    GRAM NEGATIVE RODS IN PEDIATRIC BOTTLE CRITICAL RESULT CALLED TO, READ BACK BY AND VERIFIED WITH: EVernona Rieger Pharm.D. 10:00 10/25/16 ([email or web address])    Culture PSEUDOMONAS AERUGINOSA (A)  Final   Report Status 10/27/2016 FINAL  Final   Organism ID, Bacteria PSEUDOMONAS AERUGINOSA  Final      Susceptibility   Pseudomonas aeruginosa - MIC*    CEFTAZIDIME 4 SENSITIVE Sensitive     CIPROFLOXACIN <=0.25 SENSITIVE Sensitive     GENTAMICIN  4 SENSITIVE Sensitive     IMIPENEM 1 SENSITIVE Sensitive     PIP/TAZO <=4 SENSITIVE Sensitive     CEFEPIME <=1 SENSITIVE Sensitive     * PSEUDOMONAS AERUGINOSA  Blood Culture ID Panel (Reflexed)     Status: Abnormal   Collection Time: 10/24/16 12:14 PM  Result Value Ref Range Status   Enterococcus species NOT DETECTED NOT DETECTED Final   Listeria monocytogenes NOT DETECTED NOT DETECTED Final   Staphylococcus species NOT DETECTED NOT DETECTED Final   Staphylococcus aureus NOT DETECTED NOT DETECTED Final   Streptococcus species NOT DETECTED NOT DETECTED Final   Streptococcus agalactiae NOT DETECTED NOT DETECTED Final   Streptococcus pneumoniae NOT DETECTED NOT DETECTED Final   Streptococcus pyogenes NOT DETECTED NOT DETECTED Final   Acinetobacter baumannii NOT DETECTED NOT DETECTED Final  Enterobacteriaceae species NOT DETECTED NOT DETECTED Final   Enterobacter cloacae complex NOT DETECTED NOT DETECTED Final   Escherichia coli NOT DETECTED NOT DETECTED Final   Klebsiella oxytoca NOT DETECTED NOT DETECTED Final   Klebsiella pneumoniae NOT DETECTED NOT DETECTED Final   Proteus species NOT DETECTED NOT DETECTED Final   Serratia marcescens NOT DETECTED NOT DETECTED Final   Carbapenem resistance NOT DETECTED NOT DETECTED Final   Haemophilus influenzae NOT DETECTED NOT DETECTED Final   Neisseria meningitidis NOT DETECTED NOT DETECTED Final   Pseudomonas aeruginosa DETECTED (A) NOT DETECTED Final    Comment: CRITICAL RESULT CALLED TO, READ BACK BY AND VERIFIED WITH: E. Vernona Rieger Pharm.D. 10:00 10/25/16 ([email or web address])    Candida albicans NOT DETECTED NOT DETECTED Final   Candida glabrata NOT DETECTED NOT DETECTED Final   Candida krusei NOT DETECTED NOT DETECTED Final   Candida parapsilosis NOT DETECTED NOT DETECTED Final   Candida tropicalis NOT DETECTED NOT DETECTED Final  MRSA PCR Screening     Status: Abnormal   Collection Time: 10/24/16 12:29 PM  Result Value Ref Range Status   MRSA by  PCR POSITIVE (A) NEGATIVE Final    Comment:        The GeneXpert MRSA Assay (FDA approved for NASAL specimens only), is one component of a comprehensive MRSA colonization surveillance program. It is not intended to diagnose MRSA infection nor to guide or monitor treatment for MRSA infections. RESULT CALLED TO, READ BACK BY AND VERIFIED WITH: Willadean Carol RN 15:50 10/24/16 ([email or web address])      Studies:   Scheduled Meds: . Chlorhexidine Gluconate Cloth  6 each Topical Q0600  . clopidogrel  75 mg Oral Daily  . docusate  100 mg Oral BID  . donepezil  10 mg Oral QHS  . enoxaparin (LOVENOX) injection  30 mg Subcutaneous Q24H  . escitalopram  5 mg Oral Daily  . feeding supplement (ENSURE ENLIVE)  237 mL Oral BID BM  . levothyroxine  100 mcg Oral QAC breakfast  . mupirocin ointment  1 application Nasal BID  . pantoprazole  40 mg Oral Daily  . saccharomyces boulardii  250 mg Oral Daily   Continuous Infusions: . dextrose 5 % and 0.45% NaCl 75 mL/hr at 10/27/16 1914  . meropenem (MERREM) IV 1 g (10/27/16 1234)    Active Problems:   Barrett's esophagus   Dementia   Lobar pneumonia (HCC)   Acute lower UTI   Atypical chest pain   History of CVA (cerebrovascular accident)   Hypothyroid   Thoracic aortic aneurysm without rupture ([place])   UTI (urinary tract infection)    Time spent: 30 minutes    Barton Dubois  [facility] Pager [phone]. If 7PM-7AM, please contact night-coverage at [email or web address], password [facility] 10/27/2016, 8:43 PM  LOS: 2 days

## 2016-10-27 NOTE — Progress Notes (Signed)
  Speech Language Pathology Treatment: Dysphagia  Patient Details Name: [name] MRN: [id number] DOB: [birth year] Today's Date: 10/27/2016 Time: 1151-1200 SLP Time Calculation (min) (ACUTE ONLY): 9 min  Assessment / Plan / Recommendation Clinical Impression  No s/s aspiration with Dys 3 texture and thin liquids. RN reported pt expectorated pill when whole in applesauce. Family reports taking pills at home with liquids without difficulty. RN can try with liquids and if difficulty, return to crush in applesauce. Pt will likely have continuous dysphagia as dementia progresses. Educated family re: this yesterday.    HPI HPI: Hashem Goynes Mayis a 83 y.o.malewith medical history significant of BPH, dementia, diverticolosis, HTN, Hypothyroid, Pancreatitis, CVA w/ L sided weakness and pna. Admitted with chest pain. Per chart patient's wife thinks that his symptoms are likely related to reflux as he does have this from time to time and symptoms slowly resolved after antacids. CXR 7/23 minimal right basilar subsegmental atelectasis. Mildly increased left basilar opacity concerning for atelectasis or infiltrate with associated pleural effusion.       SLP Plan  Discharge SLP treatment due to (comment);All goals met       Recommendations  Diet recommendations: Dysphagia 3 (mechanical soft);Thin liquid Liquids provided via: Cup;No straw Medication Administration: Crushed with puree Supervision: Patient able to self feed;Full supervision/cueing for compensatory strategies Compensations: Slow rate;Small sips/bites;Minimize environmental distractions Postural Changes and/or Swallow Maneuvers: Seated upright 90 degrees                Oral Care Recommendations: Oral care BID Follow up Recommendations: None SLP Visit Diagnosis: Dysphagia, unspecified (R13.10) Plan: Discharge SLP treatment due to (comment);All goals met       GO                [place] Siren 10/27/2016, 1:31 PM    Orbie Pyo Colvin Caroli.Ed [employer] [phone]

## 2016-10-27 NOTE — Consult Note (Signed)
[facility] Primary Care Navigator  10/27/2016  [name] March 29, 1931 [id number]   Went to see patientat the bedside to identify possible discharge needs and met with daughter [name]). Patient is demented. He is was awake and able to answer simple questions about self. Daughterreports that patient is bed bound at baseline. He had "blood in urine" and "complained of chest hurting" that led to this admission. Daughter confirmed that primary care provider is Dr. Elsie Stain with [employer] at [facility].   Patient's daughter shared using [facility] in [place] to obtain medications without difficulty.   Per daughter, wife [name]) manages his medications at home using "pill box" system filled weekly.  Patient's wife and sons [name] and [name]- lives with parents) had been transporting patient tohisdoctors'appointments per daughter.  Wife is the primary caregiver for patient at home. According to daughter, caregiver from [facility] also comes to patient's house 3 times a week, 4 hours per day to assist with care.    Anticipated discharge plan is homewith wife and caregiver's assistance according to daughter.  Daughter voiced understanding to call primary care provider's officewhen patient returns back home,for a post discharge follow-up appointment within a week or sooner if needed.Patient letter (with PCP's contact number) was provided as a reminder.  Explained to daughter regarding [facility] CM services available for health management and hadverbally agreed withEMMI Pneumoniacalls to help monitor patient's recovery at home.   Referral was made for EMMI Pneumoniacalls after discharge.  For questions, please contact:  [name], BSN, RN- [facility] Primary Care Navigator  Telephone: [phone] [place]

## 2016-10-27 NOTE — Care Management Important Message (Signed)
Important Message  Patient Details  Name: Shannan Garfinkel Skowronek MRN: [id number] Date of Birth: 1931/03/03   Medicare Important Message Given:  Yes    Draeden Kellman, Rory Percy, RN 10/27/2016, 11:13 AM

## 2016-10-27 NOTE — Progress Notes (Signed)
Initial Nutrition Assessment  DOCUMENTATION CODES:   Severe malnutrition in context of chronic illness  INTERVENTION:   Ensure Enlive po BID, each supplement provides 350 kcal and 20 grams of protein  Magic cup BID with meals, each supplement provides 290 kcal and 9 grams of protein  Feeding assistance and encouragement at meal times  NUTRITION DIAGNOSIS:   Malnutrition (Severe) related to chronic illness (severe dementia, bedbound post hip fracture in 2016) as evidenced by severe depletion of body fat, severe depletion of muscle mass.  GOAL:   Patient will meet greater than or equal to 90% of their needs  MONITOR:   PO intake, Supplement acceptance, Labs, Weight trends  REASON FOR ASSESSMENT:   Consult Assessment of nutrition requirement/status  ASSESSMENT:   83 yo male admitted with LLL pneumonia. Pt with hx of severe dementia, diverticulosis, HTN, pancreatitis, CVA w/ L sided weakness. Pt bedbound secondary to L hip and femur fracture since 2016  CT Abdomen today  Recorded po intake 50-55% yesterday, 15-20% of meals today. Family reports that pt typically eats 2 meals per day (pt has never really eaten breakfast). Pt occassionally snacks during the day, likes Ensure/Boost.   Wife reports weight has been relatively stable recently. Acknowledges that pt weight has declined since fracture in 2016. Pt weighed around 155 pounds at that time. 20% wt loss in that time frame.   Nutrition-Focused physical exam completed. Findings are moderate to severe fat depletion, moderate to severe muscle depletion, and no edema. Depletions noted all over body, not just in LE (expected due to bed bound status)    Labs: reviewed Meds: D5-1/2 NS at 75 ml/hr, florastor  Diet Order:  DIET DYS 3 Room service appropriate? Yes; Fluid consistency: Thin  Skin:  Reviewed, no issues  Last BM:  7/25  Height:   Ht Readings from Last 1 Encounters:  10/24/16 5\' 8"  (1.727 m)    Weight:   Wt  Readings from Last 1 Encounters:  10/26/16 124 lb (56.2 kg)    Ideal Body Weight:     BMI:  Body mass index is 18.85 kg/m.  Estimated Nutritional Needs:   Kcal:  1500-1700 kcals  Protein:  75-85 g  Fluid:  >/= 1.5 L  EDUCATION NEEDS:   No education needs identified at this time  [place], [place], LDN [phone] Pager  [phone] Weekend/On-Call Pager

## 2016-10-28 DIAGNOSIS — R310 Gross hematuria: Secondary | ICD-10-CM

## 2016-10-28 DIAGNOSIS — J189 Pneumonia, unspecified organism: Secondary | ICD-10-CM

## 2016-10-28 DIAGNOSIS — R0789 Other chest pain: Secondary | ICD-10-CM

## 2016-10-28 DIAGNOSIS — R7881 Bacteremia: Secondary | ICD-10-CM

## 2016-10-28 DIAGNOSIS — J151 Pneumonia due to Pseudomonas: Secondary | ICD-10-CM

## 2016-10-28 LAB — BASIC METABOLIC PANEL
ANION GAP: 3 — AB (ref 5–15)
BUN: 16 mg/dL (ref 6–20)
CO2: 27 mmol/L (ref 22–32)
Calcium: 7.8 mg/dL — ABNORMAL LOW (ref 8.9–10.3)
Chloride: 102 mmol/L (ref 101–111)
Creatinine, Ser: 0.69 mg/dL (ref 0.61–1.24)
Glucose, Bld: 97 mg/dL (ref 65–99)
POTASSIUM: 3.2 mmol/L — AB (ref 3.5–5.1)
SODIUM: 132 mmol/L — AB (ref 135–145)

## 2016-10-28 MED ORDER — LEVOFLOXACIN 750 MG PO TABS
750.0000 mg | ORAL_TABLET | ORAL | Status: DC
  Administered 2016-10-28: 750 mg via ORAL
  Filled 2016-10-28 (×2): qty 1

## 2016-10-28 MED ORDER — POTASSIUM CHLORIDE 20 MEQ/15ML (10%) PO SOLN
40.0000 meq | Freq: Every day | ORAL | Status: DC
  Administered 2016-10-28 – 2016-10-29 (×2): 40 meq via ORAL
  Filled 2016-10-28 (×2): qty 30

## 2016-10-28 MED ORDER — ENOXAPARIN SODIUM 40 MG/0.4ML ~~LOC~~ SOLN
40.0000 mg | SUBCUTANEOUS | Status: DC
  Administered 2016-10-29: 40 mg via SUBCUTANEOUS
  Filled 2016-10-28: qty 0.4

## 2016-10-28 NOTE — Consult Note (Signed)
[facility]       Reason for Consult: Pseudomonal bacteremia    Referring Physician: Dr. Carolin Sicks  Active Problems:   Barrett's esophagus   Dementia   Lobar pneumonia ([place])   Acute lower UTI   Atypical chest pain   History of CVA (cerebrovascular accident)   Hypothyroid   Thoracic aortic aneurysm without rupture ([place])   UTI (urinary tract infection)   . Chlorhexidine Gluconate Cloth  6 each Topical Q0600  . docusate  100 mg Oral BID  . donepezil  10 mg Oral QHS  . enoxaparin (LOVENOX) injection  30 mg Subcutaneous Q24H  . escitalopram  5 mg Oral Daily  . feeding supplement (ENSURE ENLIVE)  237 mL Oral BID BM  . levofloxacin  750 mg Oral Q24H  . levothyroxine  100 mcg Oral QAC breakfast  . mupirocin ointment  1 application Nasal BID  . pantoprazole  40 mg Oral Daily  . potassium chloride  40 mEq Oral Daily  . saccharomyces boulardii  250 mg Oral Daily    Recommendations: levaquin 2 more days   Assessment: He has possible pneumonia with chest pain, some mild hypoxia on admission ad LLL opacity on CT in the setting of Pseudomonal bacteremia in 2/2 blood cultures.  Improved now.  Would treat as above.  Hematuria - intermittent and being evaluated by urology.  He was not having any known dysuria, pyuria on admission so I do not think this was a 'UTI'.  Thanks for consult.  Will follow up as needed for any changes.     Antibiotics: Ceftriaxone x 1 dose Azithromycin x 2 days Meropenem x 3 days Day 2 levaquin  HPI: [name] is a 83 y.o. male with a history of intermittent hematuria, dementia, HTN, CVA who came in with chest pain.  CT scan of chest with LLL opacity.  Feels better since admission.  Wife at the bedside confirms this.  No compliants of urinary discomfort.  No SOB, no cough.  Was confused above baseline on admission.  Also has reflux.  No known sick contacts.  CXR independently reviewed from admission and minimal opacity.     Review of Systems:  Constitutional: negative for fevers, chills and anorexia Gastrointestinal: negative for diarrhea Integument/breast: negative for rash All other systems reviewed and are negative    Past Medical History:  Diagnosis Date  . BPH (benign prostatic hypertrophy)   . Dementia   . Diverticulosis of colon   . History of shingles   . Hypertension   . Hypothyroidism   . Multiple fractures   . Pancreatitis 2011   and gallstone pancreatitis and Ecoli bacteremia  . Pneumonia    2016  . Rabbit fever 1940  . Renal mass 2011   Eval by [name] echogenic mass on u/s. followed by [name]- observation as of 09/2010  . Stroke ([place]) 02/2015   with L sided weakness.     Social History  Substance Use Topics  . Smoking status: Never Smoker  . Smokeless tobacco: Never Used  . Alcohol use No    Family History  Problem Relation Age of Onset  . Hypertension Mother   . Stroke Mother   . Alcohol abuse Father   . Cancer Brother        prostate CA  . Heart disease Brother     Allergies  Allergen Reactions  . Sulfa Antibiotics Hives  . Valtrex [Valacyclovir Hcl] Nausea And Vomiting  NV    Physical Exam: Constitutional: in no apparent distress and alert  Vitals:   10/28/16 0546 10/28/16 0915  BP: (!) 152/87 131/84  Pulse: 65 67  Resp: 18 18  Temp: 98.3 F (36.8 C) 98.7 F (37.1 C)   EYES: anicteric ENMT: no  thtrush Cardiovascular: Cor RRR Respiratory: CT AB; normal respiratory effort GI: Bowel sounds are normal, liver is not enlarged, spleen is not enlarged, soft, nt Musculoskeletal: no pedal edema noted Skin: negatives: no rash  Lab Results  Component Value Date   WBC 12.8 (H) 10/27/2016   HGB 11.4 (L) 10/27/2016   HCT 34.9 (L) 10/27/2016   MCV 88.1 10/27/2016   PLT 170 10/27/2016    Lab Results  Component Value Date   CREATININE 0.69 10/28/2016   BUN 16 10/28/2016   NA 132 (L) 10/28/2016   K 3.2 (L) 10/28/2016   CL 102 10/28/2016   CO2 27  10/28/2016    Lab Results  Component Value Date   ALT 10 (L) 10/26/2016   AST 15 10/26/2016   ALKPHOS 59 10/26/2016     Microbiology: Recent Results (from the past 240 hour(s))  Culture, blood (routine x 2) Call MD if unable to obtain prior to antibiotics being given     Status: Abnormal   Collection Time: 10/24/16 12:08 PM  Result Value Ref Range Status   Specimen Description BLOOD LEFT ANTECUBITAL  Final   Special Requests IN PEDIATRIC BOTTLE Blood Culture adequate volume  Final   Culture  Setup Time   Final    GRAM NEGATIVE RODS IN PEDIATRIC BOTTLE CRITICAL VALUE NOTED.  VALUE IS CONSISTENT WITH PREVIOUSLY REPORTED AND CALLED VALUE.    Culture (A)  Final    PSEUDOMONAS AERUGINOSA SUSCEPTIBILITIES PERFORMED ON PREVIOUS CULTURE WITHIN THE LAST 5 DAYS.    Report Status 10/27/2016 FINAL  Final  Culture, blood (routine x 2) Call MD if unable to obtain prior to antibiotics being given     Status: Abnormal   Collection Time: 10/24/16 12:14 PM  Result Value Ref Range Status   Specimen Description BLOOD RIGHT ANTECUBITAL  Final   Special Requests IN PEDIATRIC BOTTLE Blood Culture adequate volume  Final   Culture  Setup Time   Final    GRAM NEGATIVE RODS IN PEDIATRIC BOTTLE CRITICAL RESULT CALLED TO, READ BACK BY AND VERIFIED WITH: EVernona Rieger Pharm.D. 10:00 10/25/16 ([email or web address])    Culture PSEUDOMONAS AERUGINOSA (A)  Final   Report Status 10/27/2016 FINAL  Final   Organism ID, Bacteria PSEUDOMONAS AERUGINOSA  Final      Susceptibility   Pseudomonas aeruginosa - MIC*    CEFTAZIDIME 4 SENSITIVE Sensitive     CIPROFLOXACIN <=0.25 SENSITIVE Sensitive     GENTAMICIN 4 SENSITIVE Sensitive     IMIPENEM 1 SENSITIVE Sensitive     PIP/TAZO <=4 SENSITIVE Sensitive     CEFEPIME <=1 SENSITIVE Sensitive     * PSEUDOMONAS AERUGINOSA  Blood Culture ID Panel (Reflexed)     Status: Abnormal   Collection Time: 10/24/16 12:14 PM  Result Value Ref Range Status   Enterococcus species NOT DETECTED  NOT DETECTED Final   Listeria monocytogenes NOT DETECTED NOT DETECTED Final   Staphylococcus species NOT DETECTED NOT DETECTED Final   Staphylococcus aureus NOT DETECTED NOT DETECTED Final   Streptococcus species NOT DETECTED NOT DETECTED Final   Streptococcus agalactiae NOT DETECTED NOT DETECTED Final   Streptococcus pneumoniae NOT DETECTED NOT DETECTED Final   Streptococcus pyogenes NOT DETECTED NOT DETECTED  Final   Acinetobacter baumannii NOT DETECTED NOT DETECTED Final   Enterobacteriaceae species NOT DETECTED NOT DETECTED Final   Enterobacter cloacae complex NOT DETECTED NOT DETECTED Final   Escherichia coli NOT DETECTED NOT DETECTED Final   Klebsiella oxytoca NOT DETECTED NOT DETECTED Final   Klebsiella pneumoniae NOT DETECTED NOT DETECTED Final   Proteus species NOT DETECTED NOT DETECTED Final   Serratia marcescens NOT DETECTED NOT DETECTED Final   Carbapenem resistance NOT DETECTED NOT DETECTED Final   Haemophilus influenzae NOT DETECTED NOT DETECTED Final   Neisseria meningitidis NOT DETECTED NOT DETECTED Final   Pseudomonas aeruginosa DETECTED (A) NOT DETECTED Final    Comment: CRITICAL RESULT CALLED TO, READ BACK BY AND VERIFIED WITH: E. Vernona Rieger Pharm.D. 10:00 10/25/16 ([email or web address])    Candida albicans NOT DETECTED NOT DETECTED Final   Candida glabrata NOT DETECTED NOT DETECTED Final   Candida krusei NOT DETECTED NOT DETECTED Final   Candida parapsilosis NOT DETECTED NOT DETECTED Final   Candida tropicalis NOT DETECTED NOT DETECTED Final  MRSA PCR Screening     Status: Abnormal   Collection Time: 10/24/16 12:29 PM  Result Value Ref Range Status   MRSA by PCR POSITIVE (A) NEGATIVE Final    Comment:        The GeneXpert MRSA Assay (FDA approved for NASAL specimens only), is one component of a comprehensive MRSA colonization surveillance program. It is not intended to diagnose MRSA infection nor to guide or monitor treatment for MRSA infections. RESULT CALLED TO, READ  BACK BY AND VERIFIED WITH: Willadean Carol RN 15:50 10/24/16 ([email or web address])     Scharlene Gloss, [facility] [place] [email or web address] [phone] pager  [phone] cell 10/28/2016, 2:43 PM

## 2016-10-28 NOTE — Progress Notes (Signed)
Patient ID: [name], male   DOB: November 08, 1930, 83 y.o.   MRN: [id number]     Subjective: Pt continues to clinically improved.  No hematuria over last 48 hours.  Objective: Vital signs in last 24 hours: Temp:  [97.9 F (36.6 C)-98.7 F (37.1 C)] 97.9 F (36.6 C) (07/27 1808) Pulse Rate:  [65-74] 68 (07/27 1808) Resp:  [17-18] 17 (07/27 1808) BP: (131-152)/(84-89) 141/89 (07/27 1808) SpO2:  [93 %-97 %] 93 % (07/27 1808) Weight:  [52.2 kg (115 lb)] 52.2 kg (115 lb) (07/26 2050)  Intake/Output from previous day: 07/26 0701 - 07/27 0700 In: 1945 [P.O.:820; I.V.:1125] Out: 1450 [Urine:1450] Intake/Output this shift: No intake/output data recorded.  Physical Exam:  GU: Urine clear in condom catheter  Lab Results:  Recent Labs  10/26/16 0702 10/27/16 0443  HGB 10.7* 11.4*  HCT 33.1* 34.9*   BMET  Recent Labs  10/27/16 0443 10/28/16 0425  NA 137 132*  K 3.6 3.2*  CL 108 102  CO2 25 27  GLUCOSE 104* 97  BUN 32* 16  CREATININE 0.96 0.69  CALCIUM 7.8* 7.8*     Studies/Results: Ct Abdomen Pelvis W Wo Contrast  Result Date: 10/27/2016 CLINICAL DATA:  83 year old male with history of gross hematuria. Right renal neoplasm. EXAM: CT ABDOMEN AND PELVIS WITHOUT AND WITH CONTRAST TECHNIQUE: Multidetector CT imaging of the abdomen and pelvis was performed following the standard protocol before and following the bolus administration of intravenous contrast. CONTRAST:  <See Chart> ISOVUE-300 IOPAMIDOL (ISOVUE-300) INJECTION 61% COMPARISON:  CT the abdomen and pelvis 12/14/2015. Chest CT 10/24/2016. FINDINGS: Lower chest: In the visualized lung bases there are multiple nodules, several of which appear cavitary and thick walled, the largest of which is in the right middle lobe measuring 12 mm in diameter (axial image 1 of series 9). Small bilateral pleural effusions lying dependently with extensive passive atelectasis in the visualize lung bases. There is also some airspace  consolidation in the right lower lobe. Small hiatal hernia. Hepatobiliary: Subcentimeter low-attenuation lesions in the right lobe of the liver too small to definitively characterize, but are favored to represent tiny cysts. No intra or extrahepatic biliary ductal dilatation. Status post cholecystectomy. Pancreas: No pancreatic mass. No pancreatic ductal dilatation. No pancreatic or peripancreatic fluid or inflammatory changes. Spleen: Unremarkable. Adrenals/Urinary Tract: Multiple small nonobstructive calculi are noted within both renal collecting systems (left greater than right), the largest of which measures up to 6 mm in the lower pole of the left kidney. No ureteral or bladder stones identified. In the lateral aspect of the lower pole of the right kidney there is a 2.6 x 2.6 x 2.2 cm enhancing lesion (axial image 36 of series 7 and coronal image 77 of series 8) which is compatible with a solid renal neoplasm. This appears in capsulated within Gerota's fascia and is well separated from the right renal vein which is widely patent at this time. Other low-attenuation lesions are noted in the kidneys bilaterally, compatible with simple cysts, the largest of which is exophytic in the upper pole of the left kidney measuring up to 7 cm in diameter. There is mild left-sided hydronephrosis. On post-contrast images there is some low-attenuation in the left renal pelvis at the level of the ureteropelvic junction (axial image 26 of series 7), and there are small filling defects in this region on postcontrast delayed images (axial image 26 of series 11 and coronal image 72 of series 13) measuring up to 5 mm in diameter. No right-sided  hydroureteronephrosis. Foley balloon catheter in the lumen of the urinary bladder which is nearly decompressed. Small amount of gas in the urinary bladder is iatrogenic. Bilateral adrenal glands are normal in appearance. Stomach/Bowel: Stomach is normal. There is no pathologic dilatation of  small bowel or colon. The appendix is not confidently identified and Buick be surgically absent. Regardless, there are no inflammatory changes noted adjacent to the cecum to suggest the presence of an acute appendicitis at this time. Vascular/Lymphatic: Aortic atherosclerosis, without evidence of aneurysm or dissection in the abdominal or pelvic vasculature. No lymphadenopathy identified in the abdomen or pelvis. Reproductive: Prostate gland and seminal vesicles are unremarkable in appearance. Other: Mild diffuse mesenteric edema. Presacral edema posterior to the rectum. No significant volume of ascites. No pneumoperitoneum. Musculoskeletal: There are no aggressive appearing lytic or blastic lesions noted in the visualized portions of the skeleton. IMPRESSION: 1. Nonobstructive calculi are noted in the collecting systems of the kidneys bilaterally measuring up to 6 mm in the lower pole collecting system of the left kidney. No ureteral stones or bladder stones identified. 2. Small filling defects in the left renal pelvis at the level of the ureteropelvic junction with mild left hydronephrosis. This is concerning for potential upper tract urothelial neoplasm, however, in the setting of hematuria this could simply represent some thrombus, or could represent noncalcified calculi. 3. Previously noted enhancing lesion in the lower pole of the right kidney has increased in size and currently measures 2.6 x 2.6 x 2.2 cm. This remains in capsulated within Gerota's fascia and is separate from the right renal vein which is widely patent at this time. 4. Numerous predominantly cavitary nodules throughout the visualized lung bases. These are new compared to the prior chest CT from 10/24/2016, and are therefore considered highly concerning for septic emboli. 5. Increasing small bilateral pleural effusions with areas of passive atelectasis in lung bases and airspace consolidation in the right lower lobe, concerning for pneumonia. 6.  Aortic atherosclerosis. These results will be called to the ordering clinician or representative by the Radiologist Assistant, and communication documented in the PACS or zVision Dashboard. Aortic Atherosclerosis (ICD10-I70.0). Electronically Signed   By: Vinnie Langton M.D.   On: 10/27/2016 13:18    Assessment/Plan: 1) Hematuria: CT reviewed.  Small questionable filling defect at left UPJ.  Although, a small urothelial malignancy cannot be excluded, the small size and questionable nature of this filling defect is far from suspicious either.  I have discussed findings with patient and his wife.  They will f/u as an outpatient for cystoscopy and to further discuss CT findings which we [place] elect to follow with repeat imaging rather than endoscopic evaluation considering his other medical comorbidities.  Ok to restart antiplatelet agent since urine is currently clear.  2) Right renal mass:  This continues to indicate a very slowly growing mass that would suggest either a low grade malignancy or benign lesion.  Continue observation.  3) Pseudomonas bacteremia: Do not suspect GU source as UA on admission was without bacteria.  No urine culture obtained.  Patient has condom catheter as this is how he manages bladder.  No indication for indwelling catheter at this time.   Will sign off with plans to arrange outpatient follow up.   LOS: 3 days   [name],LES 10/28/2016, 7:58 PM

## 2016-10-28 NOTE — Progress Notes (Signed)
PROGRESS NOTE    [name]  ATF:[id number] DOB: 02/16/1931 DOA: 10/24/2016 PCP: Tonia Ghent, MD   Brief Narrative: 83 year old male with history of BPH, dementia, diverticulosis, hypertension, hypothyroidism, prior stroke with left-sided weakness presented with chest discomfort and hematuria. Patient also with worsening confusion recently.  Assessment & Plan:   # Pseudomonas bacteremia with Left lower lobe pneumonia -Patient is afebrile, on room air, leukocytosis trending down. -Continue Levaquin. -CT scan of chest showed no mellitus cavitary nodules on the lung bases. Infectious disease consult requested to evaluate the patient and for antibiotics course. Patient seems like clinically responding with antibiotics. -Follow up culture results  # Hematuria and possible UTI: Urine culture contaminated, is likely to be UTI. Patient has Foley catheter insertion. Evaluated by urologist for hematuria. CT scan of abdomen and pelvis showed nonobstructive calculi, mild left hydronephrosis. Follow-up urology for further evaluation including cystoscopy. Patient needs outpatient follow-up with urologist.  # Chest pain likely in the setting of pneumonia. Clinically improving. Troponin negative. EKG unremarkable.  #History of stroke: Plavix is currently on hold because of hematuria. Detjen be able to resume shown. PT OT evaluation. Patient is on dysphagia level III diet.  #Bedbound at baseline: Continue supportive care.  #History of depression, dementia of unknown type: Continue home medications and getting Aricept and Lexapro  #Hypothyroidism: Continue Synthroid  #GERD: Continue PPI  #Ascending aortic aneurysm: Recommended outpatient follow-up  #Acute kidney injury improved  #Hyponatremia and hypokalemia: Repleted potassium chloride. Repeat lab in the morning. Encourage oral intake.  DVT prophylaxis: Lovenox subcutaneous Code Status: DO NOT RESUSCITATE Family Communication:Discussed  with the patient's daughter at bedside Disposition Plan: Likely discharge home with home care services in 1-2 days    Consultants:   Infectious disease  Urology  Procedures: CT chest, abdomen and pelvis Antimicrobials:  Meropenem 7/24>>7/26  levaquin 7/26 Subjective: Seen and examined at bedside. Patient was alert awake. Denied headache, dizziness, nausea vomiting chest pain or shortness of breath. His daughter was at bedside  Objective: Vitals:   10/27/16 1649 10/27/16 2050 10/28/16 0546 10/28/16 0915  BP: 128/81 136/88 (!) 152/87 131/84  Pulse: 74 74 65 67  Resp: 18 18 18 18   Temp: 99 F (37.2 C) 98.4 F (36.9 C) 98.3 F (36.8 C) 98.7 F (37.1 C)  TempSrc: Oral Oral Oral Oral  SpO2: 96% 97% 95% 97%  Weight:  52.2 kg (115 lb)    Height:        Intake/Output Summary (Last 24 hours) at 10/28/16 1659 Last data filed at 10/28/16 1400  Gross per 24 hour  Intake             1905 ml  Output             1475 ml  Net              430 ml   Filed Weights   10/25/16 2112 10/26/16 2059 10/27/16 2050  Weight: 47.6 kg (105 lb) 56.2 kg (124 lb) 52.2 kg (115 lb)    Examination:  General exam: Appears calm and comfortable  Respiratory system: Clear to auscultation. Respiratory effort normal. No wheezing or crackle Cardiovascular system: S1 & S2 heard, RRR.  No pedal edema. Gastrointestinal system: Abdomen is nondistended, soft and nontender. Normal bowel sounds heard. Central nervous system: Alert Awake and following commands Skin: No rashes, lesions or ulcers Psychiatry: Judgement and insight appear impaired    Data Reviewed: I have personally reviewed following labs and imaging studies  CBC:  Recent Labs Lab 10/24/16 0647 10/25/16 0509 10/26/16 0702 10/27/16 0443  WBC 13.9* 22.8* 15.9* 12.8*  HGB 13.5 12.0* 10.7* 11.4*  HCT 41.2 37.6* 33.1* 34.9*  MCV 88.8 89.3 89.0 88.1  PLT 261 201 163 357   Basic Metabolic Panel:  Recent Labs Lab 10/24/16 0647  10/25/16 0509 10/26/16 0702 10/27/16 0443 10/28/16 0425  NA 142 137 140 137 132*  K 4.2 4.6 3.9 3.6 3.2*  CL 107 103 110 108 102  CO2 27 26 25 25 27   GLUCOSE 117* 128* 126* 104* 97  BUN 24* 34* 38* 32* 16  CREATININE 1.11 1.77* 1.54* 0.96 0.69  CALCIUM 8.9 8.6* 7.8* 7.8* 7.8*   GFR: Estimated Creatinine Clearance: 51.7 mL/min (by C-G formula based on SCr of 0.69 mg/dL). Liver Function Tests:  Recent Labs Lab 10/26/16 0702  AST 15  ALT 10*  ALKPHOS 59  BILITOT 0.6  PROT 4.8*  ALBUMIN 2.2*   No results for input(s): LIPASE, AMYLASE in the last 168 hours. No results for input(s): AMMONIA in the last 168 hours. Coagulation Profile: No results for input(s): INR, PROTIME in the last 168 hours. Cardiac Enzymes:  Recent Labs Lab 10/24/16 1208  TROPONINI <0.03  <0.03   BNP (last 3 results) No results for input(s): PROBNP in the last 8760 hours. HbA1C: No results for input(s): HGBA1C in the last 72 hours. CBG: No results for input(s): GLUCAP in the last 168 hours. Lipid Profile: No results for input(s): CHOL, HDL, LDLCALC, TRIG, CHOLHDL, LDLDIRECT in the last 72 hours. Thyroid Function Tests: No results for input(s): TSH, T4TOTAL, FREET4, T3FREE, THYROIDAB in the last 72 hours. Anemia Panel: No results for input(s): VITAMINB12, FOLATE, FERRITIN, TIBC, IRON, RETICCTPCT in the last 72 hours. Sepsis Labs: No results for input(s): PROCALCITON, LATICACIDVEN in the last 168 hours.  Recent Results (from the past 240 hour(s))  Culture, blood (routine x 2) Call MD if unable to obtain prior to antibiotics being given     Status: Abnormal   Collection Time: 10/24/16 12:08 PM  Result Value Ref Range Status   Specimen Description BLOOD LEFT ANTECUBITAL  Final   Special Requests IN PEDIATRIC BOTTLE Blood Culture adequate volume  Final   Culture  Setup Time   Final    GRAM NEGATIVE RODS IN PEDIATRIC BOTTLE CRITICAL VALUE NOTED.  VALUE IS CONSISTENT WITH PREVIOUSLY REPORTED AND  CALLED VALUE.    Culture (A)  Final    PSEUDOMONAS AERUGINOSA SUSCEPTIBILITIES PERFORMED ON PREVIOUS CULTURE WITHIN THE LAST 5 DAYS.    Report Status 10/27/2016 FINAL  Final  Culture, blood (routine x 2) Call MD if unable to obtain prior to antibiotics being given     Status: Abnormal   Collection Time: 10/24/16 12:14 PM  Result Value Ref Range Status   Specimen Description BLOOD RIGHT ANTECUBITAL  Final   Special Requests IN PEDIATRIC BOTTLE Blood Culture adequate volume  Final   Culture  Setup Time   Final    GRAM NEGATIVE RODS IN PEDIATRIC BOTTLE CRITICAL RESULT CALLED TO, READ BACK BY AND VERIFIED WITH: EVernona Rieger Pharm.D. 10:00 10/25/16 ([email or web address])    Culture PSEUDOMONAS AERUGINOSA (A)  Final   Report Status 10/27/2016 FINAL  Final   Organism ID, Bacteria PSEUDOMONAS AERUGINOSA  Final      Susceptibility   Pseudomonas aeruginosa - MIC*    CEFTAZIDIME 4 SENSITIVE Sensitive     CIPROFLOXACIN <=0.25 SENSITIVE Sensitive     GENTAMICIN 4 SENSITIVE Sensitive     IMIPENEM 1  SENSITIVE Sensitive     PIP/TAZO <=4 SENSITIVE Sensitive     CEFEPIME <=1 SENSITIVE Sensitive     * PSEUDOMONAS AERUGINOSA  Blood Culture ID Panel (Reflexed)     Status: Abnormal   Collection Time: 10/24/16 12:14 PM  Result Value Ref Range Status   Enterococcus species NOT DETECTED NOT DETECTED Final   Listeria monocytogenes NOT DETECTED NOT DETECTED Final   Staphylococcus species NOT DETECTED NOT DETECTED Final   Staphylococcus aureus NOT DETECTED NOT DETECTED Final   Streptococcus species NOT DETECTED NOT DETECTED Final   Streptococcus agalactiae NOT DETECTED NOT DETECTED Final   Streptococcus pneumoniae NOT DETECTED NOT DETECTED Final   Streptococcus pyogenes NOT DETECTED NOT DETECTED Final   Acinetobacter baumannii NOT DETECTED NOT DETECTED Final   Enterobacteriaceae species NOT DETECTED NOT DETECTED Final   Enterobacter cloacae complex NOT DETECTED NOT DETECTED Final   Escherichia coli NOT DETECTED NOT  DETECTED Final   Klebsiella oxytoca NOT DETECTED NOT DETECTED Final   Klebsiella pneumoniae NOT DETECTED NOT DETECTED Final   Proteus species NOT DETECTED NOT DETECTED Final   Serratia marcescens NOT DETECTED NOT DETECTED Final   Carbapenem resistance NOT DETECTED NOT DETECTED Final   Haemophilus influenzae NOT DETECTED NOT DETECTED Final   Neisseria meningitidis NOT DETECTED NOT DETECTED Final   Pseudomonas aeruginosa DETECTED (A) NOT DETECTED Final    Comment: CRITICAL RESULT CALLED TO, READ BACK BY AND VERIFIED WITH: E. Vernona Rieger Pharm.D. 10:00 10/25/16 ([email or web address])    Candida albicans NOT DETECTED NOT DETECTED Final   Candida glabrata NOT DETECTED NOT DETECTED Final   Candida krusei NOT DETECTED NOT DETECTED Final   Candida parapsilosis NOT DETECTED NOT DETECTED Final   Candida tropicalis NOT DETECTED NOT DETECTED Final  MRSA PCR Screening     Status: Abnormal   Collection Time: 10/24/16 12:29 PM  Result Value Ref Range Status   MRSA by PCR POSITIVE (A) NEGATIVE Final    Comment:        The GeneXpert MRSA Assay (FDA approved for NASAL specimens only), is one component of a comprehensive MRSA colonization surveillance program. It is not intended to diagnose MRSA infection nor to guide or monitor treatment for MRSA infections. RESULT CALLED TO, READ BACK BY AND VERIFIED WITH: Willadean Carol RN 15:50 10/24/16 ([email or web address])          Radiology Studies: Ct Abdomen Pelvis W Wo Contrast  Result Date: 10/27/2016 CLINICAL DATA:  83 year old male with history of gross hematuria. Right renal neoplasm. EXAM: CT ABDOMEN AND PELVIS WITHOUT AND WITH CONTRAST TECHNIQUE: Multidetector CT imaging of the abdomen and pelvis was performed following the standard protocol before and following the bolus administration of intravenous contrast. CONTRAST:  <See Chart> ISOVUE-300 IOPAMIDOL (ISOVUE-300) INJECTION 61% COMPARISON:  CT the abdomen and pelvis 12/14/2015. Chest CT 10/24/2016. FINDINGS: Lower chest: In  the visualized lung bases there are multiple nodules, several of which appear cavitary and thick walled, the largest of which is in the right middle lobe measuring 12 mm in diameter (axial image 1 of series 9). Small bilateral pleural effusions lying dependently with extensive passive atelectasis in the visualize lung bases. There is also some airspace consolidation in the right lower lobe. Small hiatal hernia. Hepatobiliary: Subcentimeter low-attenuation lesions in the right lobe of the liver too small to definitively characterize, but are favored to represent tiny cysts. No intra or extrahepatic biliary ductal dilatation. Status post cholecystectomy. Pancreas: No pancreatic mass. No pancreatic ductal dilatation. No pancreatic or peripancreatic fluid or inflammatory changes.  Spleen: Unremarkable. Adrenals/Urinary Tract: Multiple small nonobstructive calculi are noted within both renal collecting systems (left greater than right), the largest of which measures up to 6 mm in the lower pole of the left kidney. No ureteral or bladder stones identified. In the lateral aspect of the lower pole of the right kidney there is a 2.6 x 2.6 x 2.2 cm enhancing lesion (axial image 36 of series 7 and coronal image 77 of series 8) which is compatible with a solid renal neoplasm. This appears in capsulated within Gerota's fascia and is well separated from the right renal vein which is widely patent at this time. Other low-attenuation lesions are noted in the kidneys bilaterally, compatible with simple cysts, the largest of which is exophytic in the upper pole of the left kidney measuring up to 7 cm in diameter. There is mild left-sided hydronephrosis. On post-contrast images there is some low-attenuation in the left renal pelvis at the level of the ureteropelvic junction (axial image 26 of series 7), and there are small filling defects in this region on postcontrast delayed images (axial image 26 of series 11 and coronal image 72 of  series 13) measuring up to 5 mm in diameter. No right-sided hydroureteronephrosis. Foley balloon catheter in the lumen of the urinary bladder which is nearly decompressed. Small amount of gas in the urinary bladder is iatrogenic. Bilateral adrenal glands are normal in appearance. Stomach/Bowel: Stomach is normal. There is no pathologic dilatation of small bowel or colon. The appendix is not confidently identified and Grieb be surgically absent. Regardless, there are no inflammatory changes noted adjacent to the cecum to suggest the presence of an acute appendicitis at this time. Vascular/Lymphatic: Aortic atherosclerosis, without evidence of aneurysm or dissection in the abdominal or pelvic vasculature. No lymphadenopathy identified in the abdomen or pelvis. Reproductive: Prostate gland and seminal vesicles are unremarkable in appearance. Other: Mild diffuse mesenteric edema. Presacral edema posterior to the rectum. No significant volume of ascites. No pneumoperitoneum. Musculoskeletal: There are no aggressive appearing lytic or blastic lesions noted in the visualized portions of the skeleton. IMPRESSION: 1. Nonobstructive calculi are noted in the collecting systems of the kidneys bilaterally measuring up to 6 mm in the lower pole collecting system of the left kidney. No ureteral stones or bladder stones identified. 2. Small filling defects in the left renal pelvis at the level of the ureteropelvic junction with mild left hydronephrosis. This is concerning for potential upper tract urothelial neoplasm, however, in the setting of hematuria this could simply represent some thrombus, or could represent noncalcified calculi. 3. Previously noted enhancing lesion in the lower pole of the right kidney has increased in size and currently measures 2.6 x 2.6 x 2.2 cm. This remains in capsulated within Gerota's fascia and is separate from the right renal vein which is widely patent at this time. 4. Numerous predominantly  cavitary nodules throughout the visualized lung bases. These are new compared to the prior chest CT from 10/24/2016, and are therefore considered highly concerning for septic emboli. 5. Increasing small bilateral pleural effusions with areas of passive atelectasis in lung bases and airspace consolidation in the right lower lobe, concerning for pneumonia. 6. Aortic atherosclerosis. These results will be called to the ordering clinician or representative by the Radiologist Assistant, and communication documented in the PACS or zVision Dashboard. Aortic Atherosclerosis (ICD10-I70.0). Electronically Signed   By: Vinnie Langton M.D.   On: 10/27/2016 13:18        Scheduled Meds: . Chlorhexidine Gluconate Cloth  6 each Topical Q0600  . docusate  100 mg Oral BID  . donepezil  10 mg Oral QHS  . [START ON 10/29/2016] enoxaparin (LOVENOX) injection  40 mg Subcutaneous Q24H  . escitalopram  5 mg Oral Daily  . feeding supplement (ENSURE ENLIVE)  237 mL Oral BID BM  . levofloxacin  750 mg Oral Q24H  . levothyroxine  100 mcg Oral QAC breakfast  . mupirocin ointment  1 application Nasal BID  . pantoprazole  40 mg Oral Daily  . potassium chloride  40 mEq Oral Daily  . saccharomyces boulardii  250 mg Oral Daily   Continuous Infusions: . dextrose 5 % and 0.45% NaCl 75 mL/hr at 10/27/16 1914     LOS: 3 days    Tiffanye Hartmann Tanna Furry, MD [facility] Pager [phone]  If 7PM-7AM, please contact night-coverage [email or web address] Password [facility] 10/28/2016, 4:59 PM

## 2016-10-29 LAB — BASIC METABOLIC PANEL
Anion gap: 3 — ABNORMAL LOW (ref 5–15)
BUN: 11 mg/dL (ref 6–20)
CALCIUM: 7.7 mg/dL — AB (ref 8.9–10.3)
CO2: 28 mmol/L (ref 22–32)
Chloride: 101 mmol/L (ref 101–111)
Creatinine, Ser: 0.64 mg/dL (ref 0.61–1.24)
GFR calc Af Amer: >60 mL/min (ref >60)
GLUCOSE: 100 mg/dL — AB (ref 65–99)
Potassium: 3.4 mmol/L — ABNORMAL LOW (ref 3.5–5.1)
Sodium: 132 mmol/L — ABNORMAL LOW (ref 135–145)

## 2016-10-29 LAB — CBC
HCT: 36.2 % — ABNORMAL LOW (ref 39.0–52.0)
Hemoglobin: 12.1 g/dL — ABNORMAL LOW (ref 13.0–17.0)
MCH: 28.9 pg (ref 26.0–34.0)
MCHC: 33.4 g/dL (ref 30.0–36.0)
MCV: 86.6 fL (ref 78.0–100.0)
PLATELETS: 212 10*3/uL (ref 150–400)
RBC: 4.18 MIL/uL — ABNORMAL LOW (ref 4.22–5.81)
RDW: 14.4 % (ref 11.5–15.5)
WBC: 10.6 10*3/uL — AB (ref 4.0–10.5)

## 2016-10-29 MED ORDER — POTASSIUM CHLORIDE 20 MEQ/15ML (10%) PO SOLN: 20.0000 meq | Freq: Every day | ORAL | 0 refills | Status: DC

## 2016-10-29 MED ORDER — LEVOFLOXACIN 750 MG PO TABS: 750.0000 mg | ORAL_TABLET | ORAL | 0 refills | Status: AC

## 2016-10-29 MED ORDER — CLOPIDOGREL BISULFATE 75 MG PO TABS
75.0000 mg | ORAL_TABLET | Freq: Every day | ORAL | Status: DC
  Administered 2016-10-29: 75 mg via ORAL
  Filled 2016-10-29: qty 1

## 2016-10-29 NOTE — Progress Notes (Signed)
Foley catheter discontinued this am per MD orders,patient tolerated procedure well. Patient voided 75 ml's of yellow urine in urinal. Bladder scan was performed post void,which showed 32 ml's in bladder. Will continue to monitor patient. Family at  Bedside.

## 2016-10-29 NOTE — Discharge Summary (Addendum)
Physician Discharge Summary  [name] YQI:[id number] DOB: 03/29/1931 DOA: 10/24/2016  PCP: Tonia Ghent, MD  Admit date: 10/24/2016 Discharge date: 10/29/2016  Admitted From:home Disposition:home  Recommendations for Outpatient Follow-up:  1. Follow up with PCP in 1-2 weeks 2. Please obtain BMP/CBC in one week   Home Health:yes Equipment/Devices:none Discharge Condition:stable CODE STATUS:DNR Diet recommendation:heart healthy  Brief/Interim Summary: 83 year old male with history of BPH, dementia, diverticulosis, hypertension, hypothyroidism, prior stroke with left-sided weakness presented with chest discomfort, hematuria, worsening confusion.  # Pseudomonas bacteremia with Left lower lobe pneumonia -CT scan of chest showed cavitary nodules on the lung bases. -Treated with Levaquin with clinical improvement. Patient is afebrile on room air and leukocytosis trending down. Evaluated by infectious disease. Patient denied chest pain shortness of breath. Plan to discharge with oral Levaquin to complete the course.  # Hematuria and possible UTI: Urine culture contaminated, less likely UTI/ruled out. Patient has Foley catheter insertion. Evaluated by urologist for hematuria. CT scan of abdomen and pelvis showed nonobstructive calculi, mild left hydronephrosis. Urologist recommended outpatient follow-up. Plan to discontinue Foley catheter and bladder scan to make sure patient void without any urinary retention before discharge home today. I discussed with the patient's nurse. -Urine is clear  # Chest pain likely in the setting of pneumonia. Clinically improved. Troponin negative. EKG unremarkable.  #History of stroke: Resume Plavix. Continue supportive care.  #Bedbound at baseline: Continue supportive care.  #History of depression, dementia of unknown type: Continue home medications and getting Aricept and Lexapro  #Hypothyroidism: Continue Synthroid  #GERD: Continue  PPI  #Ascending aortic aneurysm: Recommended outpatient follow-up  #Acute kidney injury improved  #Hyponatremia and hypokalemia: Replete electrolytes. Discharging with oral potassium chloride. I recommended to check lab in a week. I discussed with the patient and his wife at bedside. The wife verbalized understanding.   Discharge Diagnoses:  Active Problems:    Dementia   Lobar pneumonia (HCC)   Acute lower UTI   Atypical chest pain   History of CVA (cerebrovascular accident)   Hypothyroid   Thoracic aortic aneurysm without rupture ([facility])   UTI (urinary tract infection)   Community acquired pneumonia of left lung    Discharge Instructions  Discharge Instructions    Call MD for:  difficulty breathing, headache or visual disturbances    Complete by:  As directed    Call MD for:  extreme fatigue    Complete by:  As directed    Call MD for:  hives    Complete by:  As directed    Call MD for:  persistant dizziness or light-headedness    Complete by:  As directed    Call MD for:  persistant nausea and vomiting    Complete by:  As directed    Call MD for:  severe uncontrolled pain    Complete by:  As directed    Call MD for:  temperature >100.4    Complete by:  As directed    Diet - low sodium heart healthy    Complete by:  As directed    Dysphagia level 3 diet   Discharge instructions    Complete by:  As directed    Please Check lab including CBC and BMP in a week with PCP.   Increase activity slowly    Complete by:  As directed      Allergies as of 10/29/2016      Reactions   Sulfa Antibiotics Hives   Valtrex [valacyclovir Hcl] Nausea And Vomiting  NV      Medication List    TAKE these medications   ACID CONTROL PO Take 2 tablets by mouth daily as needed (acid).   B-complex with vitamin C tablet Take 1 tablet by mouth daily.   clopidogrel 75 MG tablet Commonly known as:  PLAVIX Take 1 tablet by mouth daily   docusate sodium 100 MG capsule Commonly  known as:  COLACE Take 100 mg by mouth 2 (two) times daily.   donepezil 10 MG tablet Commonly known as:  ARICEPT Take 1 tablet (10 mg total) by mouth daily. What changed:  when to take this   escitalopram 5 MG tablet Commonly known as:  LEXAPRO Take 1 tablet (5 mg total) by mouth daily. What changed:  when to take this   IRON PO Take 1 tablet by mouth 2 (two) times daily.   levofloxacin 750 MG tablet Commonly known as:  LEVAQUIN Take 1 tablet (750 mg total) by mouth daily.   levothyroxine 100 MCG tablet Commonly known as:  SYNTHROID, LEVOTHROID TAKE 100 MCG BY MOUTH DAILY   multivitamin tablet Take 1 tablet by mouth daily.   ondansetron 4 MG tablet Commonly known as:  ZOFRAN Take 1 tablet (4 mg total) by mouth every 8 (eight) hours as needed for nausea or vomiting.   pantoprazole 40 MG tablet Commonly known as:  PROTONIX Take 1 tablet by mouth daily   polyethylene glycol packet Commonly known as:  MIRALAX / GLYCOLAX Take 17 g by mouth daily. What changed:  when to take this  reasons to take this   potassium chloride 20 MEQ/15ML (10%) Soln Take 15 mLs (20 mEq total) by mouth daily.   PROBIOTIC PO Take 1 tablet by mouth daily.      Follow-up Information    Tonia Ghent, MD. Schedule an appointment as soon as possible for a visit in 1 week(s).   Specialty:  Family Medicine Contact information: [place] [address] [phone]        Raynelle Bring, MD. Schedule an appointment as soon as possible for a visit in 2 week(s).   Specialty:  Urology Contact information: [address] [place] [address] [phone]          Allergies  Allergen Reactions  . Sulfa Antibiotics Hives  . Valtrex [Valacyclovir Hcl] Nausea And Vomiting    NV    Consultations: Infectious disease Urology  Procedures/Studies: CT scan of chest abdomen and pelvis  Subjective: Seen and examined at bedside. Patient denied headache,  dizziness nausea vomiting chest pressure shortness of breath. Review of systems Limited because of his dementia.  Discharge Exam: Vitals:   10/29/16 0442 10/29/16 0930  BP: (!) 144/87 (!) 142/80  Pulse: 65 68  Resp: 18 19  Temp: 98.1 F (36.7 C) 98 F (36.7 C)   Vitals:   10/28/16 1808 10/28/16 2053 10/29/16 0442 10/29/16 0930  BP: (!) 141/89 133/86 (!) 144/87 (!) 142/80  Pulse: 68 71 65 68  Resp: 17 18 18 19   Temp: 97.9 F (36.6 C) 97.9 F (36.6 C) 98.1 F (36.7 C) 98 F (36.7 C)  TempSrc: Oral Oral Oral Oral  SpO2: 93% 97% 98% 95%  Weight:  55.3 kg (122 lb)    Height:        General: Pt is alert, awake, not in acute distress Cardiovascular: RRR, S1/S2 +, no rubs, no gallops Respiratory: CTA bilaterally, no wheezing, no rhonchi Abdominal: Soft, NT, ND, bowel sounds +  Extremities: no edema, no cyanosis    The results of significant diagnostics from this hospitalization (including imaging, microbiology, ancillary and laboratory) are listed below for reference.     Microbiology: Recent Results (from the past 240 hour(s))  Culture, blood (routine x 2) Call MD if unable to obtain prior to antibiotics being given     Status: Abnormal   Collection Time: 10/24/16 12:08 PM  Result Value Ref Range Status   Specimen Description BLOOD LEFT ANTECUBITAL  Final   Special Requests IN PEDIATRIC BOTTLE Blood Culture adequate volume  Final   Culture  Setup Time   Final    GRAM NEGATIVE RODS IN PEDIATRIC BOTTLE CRITICAL VALUE NOTED.  VALUE IS CONSISTENT WITH PREVIOUSLY REPORTED AND CALLED VALUE.    Culture (A)  Final    PSEUDOMONAS AERUGINOSA SUSCEPTIBILITIES PERFORMED ON PREVIOUS CULTURE WITHIN THE LAST 5 DAYS.    Report Status 10/27/2016 FINAL  Final  Culture, blood (routine x 2) Call MD if unable to obtain prior to antibiotics being given     Status: Abnormal   Collection Time: 10/24/16 12:14 PM  Result Value Ref Range Status   Specimen Description BLOOD RIGHT ANTECUBITAL   Final   Special Requests IN PEDIATRIC BOTTLE Blood Culture adequate volume  Final   Culture  Setup Time   Final    GRAM NEGATIVE RODS IN PEDIATRIC BOTTLE CRITICAL RESULT CALLED TO, READ BACK BY AND VERIFIED WITH: EVernona Rieger Pharm.D. 10:00 10/25/16 ([email or web address])    Culture PSEUDOMONAS AERUGINOSA (A)  Final   Report Status 10/27/2016 FINAL  Final   Organism ID, Bacteria PSEUDOMONAS AERUGINOSA  Final      Susceptibility   Pseudomonas aeruginosa - MIC*    CEFTAZIDIME 4 SENSITIVE Sensitive     CIPROFLOXACIN <=0.25 SENSITIVE Sensitive     GENTAMICIN 4 SENSITIVE Sensitive     IMIPENEM 1 SENSITIVE Sensitive     PIP/TAZO <=4 SENSITIVE Sensitive     CEFEPIME <=1 SENSITIVE Sensitive     * PSEUDOMONAS AERUGINOSA  Blood Culture ID Panel (Reflexed)     Status: Abnormal   Collection Time: 10/24/16 12:14 PM  Result Value Ref Range Status   Enterococcus species NOT DETECTED NOT DETECTED Final   Listeria monocytogenes NOT DETECTED NOT DETECTED Final   Staphylococcus species NOT DETECTED NOT DETECTED Final   Staphylococcus aureus NOT DETECTED NOT DETECTED Final   Streptococcus species NOT DETECTED NOT DETECTED Final   Streptococcus agalactiae NOT DETECTED NOT DETECTED Final   Streptococcus pneumoniae NOT DETECTED NOT DETECTED Final   Streptococcus pyogenes NOT DETECTED NOT DETECTED Final   Acinetobacter baumannii NOT DETECTED NOT DETECTED Final   Enterobacteriaceae species NOT DETECTED NOT DETECTED Final   Enterobacter cloacae complex NOT DETECTED NOT DETECTED Final   Escherichia coli NOT DETECTED NOT DETECTED Final   Klebsiella oxytoca NOT DETECTED NOT DETECTED Final   Klebsiella pneumoniae NOT DETECTED NOT DETECTED Final   Proteus species NOT DETECTED NOT DETECTED Final   Serratia marcescens NOT DETECTED NOT DETECTED Final   Carbapenem resistance NOT DETECTED NOT DETECTED Final   Haemophilus influenzae NOT DETECTED NOT DETECTED Final   Neisseria meningitidis NOT DETECTED NOT DETECTED Final    Pseudomonas aeruginosa DETECTED (A) NOT DETECTED Final    Comment: CRITICAL RESULT CALLED TO, READ BACK BY AND VERIFIED WITH: E. Vernona Rieger Pharm.D. 10:00 10/25/16 ([email or web address])    Candida albicans NOT DETECTED NOT DETECTED Final   Candida glabrata NOT DETECTED NOT DETECTED Final   Candida krusei NOT DETECTED NOT DETECTED Final  Candida parapsilosis NOT DETECTED NOT DETECTED Final   Candida tropicalis NOT DETECTED NOT DETECTED Final  MRSA PCR Screening     Status: Abnormal   Collection Time: 10/24/16 12:29 PM  Result Value Ref Range Status   MRSA by PCR POSITIVE (A) NEGATIVE Final    Comment:        The GeneXpert MRSA Assay (FDA approved for NASAL specimens only), is one component of a comprehensive MRSA colonization surveillance program. It is not intended to diagnose MRSA infection nor to guide or monitor treatment for MRSA infections. RESULT CALLED TO, READ BACK BY AND VERIFIED WITH: AAutumn Patty RN 15:50 10/24/16 ([email or web address])      Labs: BNP (last 3 results) No results for input(s): BNP in the last 8760 hours. Basic Metabolic Panel:  Recent Labs Lab 10/25/16 0509 10/26/16 0702 10/27/16 0443 10/28/16 0425 10/29/16 0317  NA 137 140 137 132* 132*  K 4.6 3.9 3.6 3.2* 3.4*  CL 103 110 108 102 101  CO2 26 25 25 27 28   GLUCOSE 128* 126* 104* 97 100*  BUN 34* 38* 32* 16 11  CREATININE 1.77* 1.54* 0.96 0.69 0.64  CALCIUM 8.6* 7.8* 7.8* 7.8* 7.7*   Liver Function Tests:  Recent Labs Lab 10/26/16 0702  AST 15  ALT 10*  ALKPHOS 59  BILITOT 0.6  PROT 4.8*  ALBUMIN 2.2*   No results for input(s): LIPASE, AMYLASE in the last 168 hours. No results for input(s): AMMONIA in the last 168 hours. CBC:  Recent Labs Lab 10/24/16 0647 10/25/16 0509 10/26/16 0702 10/27/16 0443 10/29/16 0317  WBC 13.9* 22.8* 15.9* 12.8* 10.6*  HGB 13.5 12.0* 10.7* 11.4* 12.1*  HCT 41.2 37.6* 33.1* 34.9* 36.2*  MCV 88.8 89.3 89.0 88.1 86.6  PLT 261 201 163 170 212   Cardiac Enzymes:  Recent  Labs Lab 10/24/16 1208  TROPONINI <0.03  <0.03   BNP: Invalid input(s): POCBNP CBG: No results for input(s): GLUCAP in the last 168 hours. D-Dimer No results for input(s): DDIMER in the last 72 hours. Hgb A1c No results for input(s): HGBA1C in the last 72 hours. Lipid Profile No results for input(s): CHOL, HDL, LDLCALC, TRIG, CHOLHDL, LDLDIRECT in the last 72 hours. Thyroid function studies No results for input(s): TSH, T4TOTAL, T3FREE, THYROIDAB in the last 72 hours.  Invalid input(s): FREET3 Anemia work up No results for input(s): VITAMINB12, FOLATE, FERRITIN, TIBC, IRON, RETICCTPCT in the last 72 hours. Urinalysis    Component Value Date/Time   COLORURINE RED (A) 10/24/2016 0945   APPEARANCEUR TURBID (A) 10/24/2016 0945   LABSPEC  10/24/2016 0945    TEST NOT REPORTED DUE TO COLOR INTERFERENCE OF URINE PIGMENT   PHURINE  10/24/2016 0945    TEST NOT REPORTED DUE TO COLOR INTERFERENCE OF URINE PIGMENT   GLUCOSEU (A) 10/24/2016 0945    TEST NOT REPORTED DUE TO COLOR INTERFERENCE OF URINE PIGMENT   GLUCOSEU NEGATIVE 10/04/2016 1731   HGBUR (A) 10/24/2016 0945    TEST NOT REPORTED DUE TO COLOR INTERFERENCE OF URINE PIGMENT   HGBUR large 03/31/2010 1043   BILIRUBINUR (A) 10/24/2016 0945    TEST NOT REPORTED DUE TO COLOR INTERFERENCE OF URINE PIGMENT   BILIRUBINUR negative 02/11/2016 1200   KETONESUR (A) 10/24/2016 0945    TEST NOT REPORTED DUE TO COLOR INTERFERENCE OF URINE PIGMENT   PROTEINUR (A) 10/24/2016 0945    TEST NOT REPORTED DUE TO COLOR INTERFERENCE OF URINE PIGMENT   UROBILINOGEN 1.0 10/04/2016 1731   NITRITE (A) 10/24/2016  0945    TEST NOT REPORTED DUE TO COLOR INTERFERENCE OF URINE PIGMENT   LEUKOCYTESUR (A) 10/24/2016 0945    TEST NOT REPORTED DUE TO COLOR INTERFERENCE OF URINE PIGMENT   Sepsis Labs Invalid input(s): PROCALCITONIN,  WBC,  LACTICIDVEN Microbiology Recent Results (from the past 240 hour(s))  Culture, blood (routine x 2) Call MD if unable  to obtain prior to antibiotics being given     Status: Abnormal   Collection Time: 10/24/16 12:08 PM  Result Value Ref Range Status   Specimen Description BLOOD LEFT ANTECUBITAL  Final   Special Requests IN PEDIATRIC BOTTLE Blood Culture adequate volume  Final   Culture  Setup Time   Final    GRAM NEGATIVE RODS IN PEDIATRIC BOTTLE CRITICAL VALUE NOTED.  VALUE IS CONSISTENT WITH PREVIOUSLY REPORTED AND CALLED VALUE.    Culture (A)  Final    PSEUDOMONAS AERUGINOSA SUSCEPTIBILITIES PERFORMED ON PREVIOUS CULTURE WITHIN THE LAST 5 DAYS.    Report Status 10/27/2016 FINAL  Final  Culture, blood (routine x 2) Call MD if unable to obtain prior to antibiotics being given     Status: Abnormal   Collection Time: 10/24/16 12:14 PM  Result Value Ref Range Status   Specimen Description BLOOD RIGHT ANTECUBITAL  Final   Special Requests IN PEDIATRIC BOTTLE Blood Culture adequate volume  Final   Culture  Setup Time   Final    GRAM NEGATIVE RODS IN PEDIATRIC BOTTLE CRITICAL RESULT CALLED TO, READ BACK BY AND VERIFIED WITH: EVernona Rieger Pharm.D. 10:00 10/25/16 ([email or web address])    Culture PSEUDOMONAS AERUGINOSA (A)  Final   Report Status 10/27/2016 FINAL  Final   Organism ID, Bacteria PSEUDOMONAS AERUGINOSA  Final      Susceptibility   Pseudomonas aeruginosa - MIC*    CEFTAZIDIME 4 SENSITIVE Sensitive     CIPROFLOXACIN <=0.25 SENSITIVE Sensitive     GENTAMICIN 4 SENSITIVE Sensitive     IMIPENEM 1 SENSITIVE Sensitive     PIP/TAZO <=4 SENSITIVE Sensitive     CEFEPIME <=1 SENSITIVE Sensitive     * PSEUDOMONAS AERUGINOSA  Blood Culture ID Panel (Reflexed)     Status: Abnormal   Collection Time: 10/24/16 12:14 PM  Result Value Ref Range Status   Enterococcus species NOT DETECTED NOT DETECTED Final   Listeria monocytogenes NOT DETECTED NOT DETECTED Final   Staphylococcus species NOT DETECTED NOT DETECTED Final   Staphylococcus aureus NOT DETECTED NOT DETECTED Final   Streptococcus species NOT DETECTED NOT  DETECTED Final   Streptococcus agalactiae NOT DETECTED NOT DETECTED Final   Streptococcus pneumoniae NOT DETECTED NOT DETECTED Final   Streptococcus pyogenes NOT DETECTED NOT DETECTED Final   Acinetobacter baumannii NOT DETECTED NOT DETECTED Final   Enterobacteriaceae species NOT DETECTED NOT DETECTED Final   Enterobacter cloacae complex NOT DETECTED NOT DETECTED Final   Escherichia coli NOT DETECTED NOT DETECTED Final   Klebsiella oxytoca NOT DETECTED NOT DETECTED Final   Klebsiella pneumoniae NOT DETECTED NOT DETECTED Final   Proteus species NOT DETECTED NOT DETECTED Final   Serratia marcescens NOT DETECTED NOT DETECTED Final   Carbapenem resistance NOT DETECTED NOT DETECTED Final   Haemophilus influenzae NOT DETECTED NOT DETECTED Final   Neisseria meningitidis NOT DETECTED NOT DETECTED Final   Pseudomonas aeruginosa DETECTED (A) NOT DETECTED Final    Comment: CRITICAL RESULT CALLED TO, READ BACK BY AND VERIFIED WITH: E. Vernona Rieger Pharm.D. 10:00 10/25/16 ([email or web address])    Candida albicans NOT DETECTED NOT DETECTED Final   Candida glabrata NOT DETECTED  NOT DETECTED Final   Candida krusei NOT DETECTED NOT DETECTED Final   Candida parapsilosis NOT DETECTED NOT DETECTED Final   Candida tropicalis NOT DETECTED NOT DETECTED Final  MRSA PCR Screening     Status: Abnormal   Collection Time: 10/24/16 12:29 PM  Result Value Ref Range Status   MRSA by PCR POSITIVE (A) NEGATIVE Final    Comment:        The GeneXpert MRSA Assay (FDA approved for NASAL specimens only), is one component of a comprehensive MRSA colonization surveillance program. It is not intended to diagnose MRSA infection nor to guide or monitor treatment for MRSA infections. RESULT CALLED TO, READ BACK BY AND VERIFIED WITH: Willadean Carol RN 15:50 10/24/16 ([email or web address])      Time coordinating discharge: 35 minutes  SIGNED:   Rosita Fire, MD  [facility] 10/29/2016, 11:16 AM  If 7PM-7AM, please contact  night-coverage [email or web address] Password [email or web address]

## 2016-10-29 NOTE — Progress Notes (Signed)
OT Cancellation Note  Patient Details Name: [name] MRN: [id number] DOB: 01-01-1931   Cancelled Treatment:    Reason Eval/Treat Not Completed: OT screened, no needs identified, will sign off. Pt seen last week by OT; discussed with family that pt is non mobile and total care. Pt without acute OT needs; will screen and sign off at this time.  Binnie Kand M.S., OTR/L Pager: [phone]  10/29/2016, 7:58 AM

## 2016-10-29 NOTE — Progress Notes (Signed)
PT Cancellation Note  Patient Details Name: [name] MRN: [id number] DOB: January 13, 1931        Per PT note on 7/24  Cancelled Treatment:    Reason Eval/Treat Not Completed: Other (comment).  Wife has declined therapy as pt is bedbound at baseline.   Duncan Dull 10/29/2016, 6:40 AM

## 2016-10-31 ENCOUNTER — Telehealth: Payer: Self-pay

## 2016-10-31 NOTE — Telephone Encounter (Signed)
Spoke with patients wife, [name].  Transition Care Management Follow-up Telephone Call   Date discharged? 10/29/2016   How have you been since you were released from the hospital? "he seems to be better"   Do you understand why you were in the hospital? yes, "he had a bacterial infection"   Do you understand the discharge instructions? yes   Where were you discharged to? Home. [facility] nurse 3/week.    Items Reviewed:  Medications reviewed: no. Wife will bring to appointment, states only change was addition of antibiotic and potassium.   Allergies reviewed: yes  Dietary changes reviewed: no changes  Referrals reviewed: no referrals.    Functional Questionnaire:   Activities of Daily Living (ADLs):   He states they are independent in the following: none States they require assistance with the following: ambulation, bathing and hygiene, feeding, continence, grooming, toileting and dressing. Wife states pt is bedbound and incontinent. Able to eat.    Any transportation issues/concerns?: no   Any patient concerns? no   Confirmed importance and date/time of follow-up visits scheduled yes  Provider Appointment booked with PCP 11/07/2016 @ 3:15pm  Confirmed with patient if condition begins to worsen call PCP or go to the ER.  Patient was given the office number and encouraged to call back with question or concerns.  : yes

## 2016-11-04 ENCOUNTER — Other Ambulatory Visit: Payer: Self-pay

## 2016-11-04 NOTE — Patient Outreach (Signed)
[facility]) Care Management  11/04/2016  [name] [birth year] [id number]     EMMI-PNA RED ON EMMI ALERT Day # 3 Date: 11/03/16 Red Alert Reason: "More SOB than yesterday? Yes"  "Been confused? Yes"   "Getting up and moving at all? No"   Outreach attempt # 1 to patient. Spoke with spouse(ROI and Dual POA on file). She reports that patient unable to talk on phone and she handle his phone calls and medical care. Spouse is answering automated EMMI-PNA calls. Reviewed and addressed red alerts with spouse. She states that machine recorded her responses inaccurately. She denies any new and/or worsening symptoms with patient and state she is doing fine. She voices that he has competed his antibiotic and shows no s/s of lingering infection. She is managing patient's meds. No issues with transportation. He has PCP f/u appt on 11/07/16. Spouse voices no further RN CM needs or concerns at this time. Advised patient that they would continue to get automated EMMI-PNA post discharge calls to assess how they are doing following recent hospitalization and will receive a call from a nurse if any of their responses were abnormal. Patient voiced understanding and was appreciative of f/u call.       Plan: RN CM will notify [facility] administrative assistant of case status.   Enzo Montgomery, RN,BSN,CCM [facility] Telephonic Care Management Coordinator Direct Phone: [phone] Toll Free: [phone] Fax: [phone]

## 2016-11-07 ENCOUNTER — Ambulatory Visit (INDEPENDENT_AMBULATORY_CARE_PROVIDER_SITE_OTHER): Payer: PPO | Admitting: Family Medicine

## 2016-11-07 ENCOUNTER — Other Ambulatory Visit: Payer: Self-pay

## 2016-11-07 ENCOUNTER — Encounter: Payer: Self-pay | Admitting: Family Medicine

## 2016-11-07 VITALS — BP 126/90 | HR 82 | Temp 97.5°F

## 2016-11-07 DIAGNOSIS — J189 Pneumonia, unspecified organism: Secondary | ICD-10-CM

## 2016-11-07 DIAGNOSIS — R7881 Bacteremia: Secondary | ICD-10-CM | POA: Diagnosis not present

## 2016-11-07 DIAGNOSIS — J181 Lobar pneumonia, unspecified organism: Secondary | ICD-10-CM

## 2016-11-07 DIAGNOSIS — F0280 Dementia in other diseases classified elsewhere without behavioral disturbance: Secondary | ICD-10-CM | POA: Diagnosis not present

## 2016-11-07 DIAGNOSIS — E876 Hypokalemia: Secondary | ICD-10-CM

## 2016-11-07 DIAGNOSIS — G3183 Dementia with Lewy bodies: Secondary | ICD-10-CM

## 2016-11-07 DIAGNOSIS — E039 Hypothyroidism, unspecified: Secondary | ICD-10-CM

## 2016-11-07 LAB — CBC WITH DIFFERENTIAL/PLATELET
Basophils Absolute: 0 cells/uL (ref 0–200)
Basophils Relative: 0 %
Eosinophils Absolute: 384 cells/uL (ref 15–500)
Eosinophils Relative: 3 %
HEMATOCRIT: 41.2 % (ref 38.5–50.0)
HEMOGLOBIN: 13.5 g/dL (ref 13.2–17.1)
LYMPHS ABS: 1408 {cells}/uL (ref 850–3900)
Lymphocytes Relative: 11 %
MCH: 29.1 pg (ref 27.0–33.0)
MCHC: 32.8 g/dL (ref 32.0–36.0)
MCV: 88.8 fL (ref 80.0–100.0)
MONO ABS: 896 {cells}/uL (ref 200–950)
MPV: 9.2 fL (ref 7.5–12.5)
Monocytes Relative: 7 %
Neutro Abs: 10112 cells/uL — ABNORMAL HIGH (ref 1500–7800)
Neutrophils Relative %: 79 %
PLATELETS: 453 10*3/uL — AB (ref 140–400)
RBC: 4.64 MIL/uL (ref 4.20–5.80)
RDW: 15 % (ref 11.0–15.0)
WBC: 12.8 10*3/uL — AB (ref 3.8–10.8)

## 2016-11-07 NOTE — Progress Notes (Signed)
  Disposition:home  Recommendations for Outpatient Follow-up:  1. Follow up with PCP in 1-2 weeks 2. Please obtain BMP/CBC in one week   Home Health:yes Equipment/Devices:none Discharge Condition:stable CODE STATUS:DNR Diet recommendation:heart healthy  Brief/Interim Summary: 83 year old male with history of BPH, dementia, diverticulosis, hypertension, hypothyroidism, prior stroke with left-sided weakness presented with chest discomfort, hematuria, worsening confusion.  # Pseudomonas bacteremia with Left lower lobe pneumonia -CT scan of chest showed cavitary nodules on the lung bases. -Treated with Levaquin with clinical improvement. Patient is afebrile on room air and leukocytosis trending down. Evaluated by infectious disease. Patient denied chest pain shortness of breath. Plan to discharge with oral Levaquin to complete the course.  # Hematuria and possible UTI: Urine culture contaminated, less likely UTI/ruled out. Patient has Foley catheter insertion. Evaluated by urologist for hematuria. CT scan of abdomen and pelvis showed nonobstructive calculi, mild left hydronephrosis. Urologist recommended outpatient follow-up. Plan to discontinue Foley catheter and bladder scan to make sure patient void without any urinary retention before discharge home today. I discussed with the patient's nurse. -Urine is clear  # Chest pain likely in the setting of pneumonia. Clinically improved. Troponin negative. EKG unremarkable.  #History of stroke: Resume Plavix. Continue supportive care.  #Bedbound at baseline: Continue supportive care.  #History of depression, dementia of unknown type: Continue home medications and getting Aricept and Lexapro  #Hypothyroidism: Continue Synthroid  #GERD: Continue PPI  #Ascending aortic aneurysm: Recommended outpatient follow-up  #Acute kidney injury improved  #Hyponatremia and hypokalemia: Replete electrolytes. Discharging with oral  potassium chloride. I recommended to check lab in a week. I discussed with the patient and his wife at bedside. The wife verbalized understanding.   Discharge Diagnoses:  Active Problems:    Dementia   Lobar pneumonia (HCC)   Acute lower UTI   Atypical chest pain   History of CVA (cerebrovascular accident)   Hypothyroid   Thoracic aortic aneurysm without rupture ([place])   UTI (urinary tract infection)   Community acquired pneumonia of left lung  =============================================== History of dementia at baseline, recently admitted with pneumonia and bacteremia. Possible/questionable UTI, concurrently treated with antibiotics for bacteremia. Imaging and hospital course discussed with patient and wife. Patient does not recall most of the hospitalization due to dementia. Rationale for treatment discussed with wife. She understood. He is finished antibiotics in the meantime. He still has some cough but it is not worse. He still has occasional sputum. No fevers. He is not back to baseline but not actively declining in the last few days. Still requires significant help at home. Wife has extra help coming in to help her care for patient.    No dysuria at this point. The patient has incontinence of urine at baseline, likely related to dementia. He is due for recheck labs given his previous electrolyte abnormalities. He has been continued on his baseline medicines in the meantime otherwise. Wife got potassium 99 over-the-counter and has been giving him that.  PMH and SH reviewed  ROS: Per HPI unless specifically indicated in ROS section   Meds, vitals, and allergies reviewed.   GEN: nad, alert and follows commands but speaks in short one or two word phrases. HEENT: mucous membranes moist NECK: supple w/o LA CV: rrr.  PULM: ctab, no inc wob, no focal dec in BS ABD: soft, +bs EXT: no edema In wheelchair

## 2016-11-07 NOTE — Patient Instructions (Signed)
Don't change your meds for now.  We'll contact you with your lab report. Take care.  Glad to see you.  

## 2016-11-07 NOTE — Patient Outreach (Signed)
[facility]) Care Management  11/07/2016  [name] Feb 22, 1931 [id number]     EMMI-PNA RED ON EMMI ALERT Day # 5 Date: 11/05/16 Red Alert Reason: "Been confused? Yes"   Outreach attempt #1 to patient. Spoke with spouse(ROI on file). She stets that patient has bene "having some problems" and she feels like patient Stantz not be "over the PNA." She voices that they have PCP f/u appt later today and plans to discuss her concerns/issues at time. Offered to provide some assistance to spouse during this call but she declined and voiced she would rather wait for MD appt later. Spouse also voiced that she did not like the automated EMMI-PNA post discharge call and would like them stopped. No further RN CM needs or concerns at this time.         Plan: RN CM will notify [facility] administrative assistant to deactivate EMMI-PNA calls and close case.   Enzo [place], RN,BSN,CCM [place] Management Telephonic Care Management Coordinator Direct Phone: [phone] Toll Free: [phone] Fax: [phone]

## 2016-11-08 DIAGNOSIS — E876 Hypokalemia: Secondary | ICD-10-CM | POA: Insufficient documentation

## 2016-11-08 LAB — BASIC METABOLIC PANEL
BUN: 26 mg/dL — ABNORMAL HIGH (ref 7–25)
CO2: 26 mmol/L (ref 20–32)
Calcium: 8.7 mg/dL (ref 8.6–10.3)
Chloride: 104 mmol/L (ref 98–110)
Creat: 0.83 mg/dL (ref 0.70–1.11)
Glucose, Bld: 102 mg/dL — ABNORMAL HIGH (ref 65–99)
POTASSIUM: 3.9 mmol/L (ref 3.5–5.3)
SODIUM: 139 mmol/L (ref 135–146)

## 2016-11-08 NOTE — Assessment & Plan Note (Signed)
See above. Afebrile at this point. Done with antibiotics.

## 2016-11-08 NOTE — Assessment & Plan Note (Signed)
Wife is noted more confusion with the recent illness, this was expected, discussed with patient and wife. I hope he will have some clearing of his mentation in the next few days to weeks.

## 2016-11-08 NOTE — Assessment & Plan Note (Signed)
Done with course of antibiotics. Repeat imaging at this point would likely not be useful, rationale discussed with patient and wife. Continue off antibiotics for now, observe clinically. Wife to update [place] if she notices decline inpatient status. Recheck basic labs today. He has significant care needs, currently med by wife and extra outside help at home.

## 2016-11-08 NOTE — Assessment & Plan Note (Signed)
continue replacement 

## 2016-11-08 NOTE — Assessment & Plan Note (Signed)
See notes on repeat labs. 

## 2016-11-11 DIAGNOSIS — D4112 Neoplasm of uncertain behavior of left renal pelvis: Secondary | ICD-10-CM | POA: Diagnosis not present

## 2016-11-11 DIAGNOSIS — R310 Gross hematuria: Secondary | ICD-10-CM | POA: Diagnosis not present

## 2016-11-17 ENCOUNTER — Telehealth: Payer: Self-pay | Admitting: Family Medicine

## 2016-11-17 NOTE — Telephone Encounter (Signed)
Left pt message asking to call Ebony Hail back directly at [phone] to schedule AWV + labs with Katha Cabal and CPE with PCP.  *NOTE* Last AWV 07/14/15

## 2016-11-23 NOTE — Telephone Encounter (Signed)
Pt is bedridden; declined AWV.

## 2016-12-12 ENCOUNTER — Other Ambulatory Visit: Payer: Self-pay | Admitting: Family Medicine

## 2016-12-12 DIAGNOSIS — E039 Hypothyroidism, unspecified: Secondary | ICD-10-CM

## 2016-12-13 NOTE — Telephone Encounter (Signed)
Electronic refill request.  Last office visit:   11/07/16 Last Filled:   Donepezil 90 tablet 1 05/23/2016  Last Filled:   Escitalopram  90 tablet 1 05/23/2016  Last Filled:   Levothyroxine 90 tablet 1 05/23/2016   Patient has not had TSH done in quite some time.  Please advise.

## 2016-12-14 NOTE — Telephone Encounter (Signed)
TSH to be done when possible, ordered.   Rx sent.  Thanks.

## 2016-12-14 NOTE — Telephone Encounter (Signed)
Patient's wife notified as instructed by telephone and verbalized understanding. Lab appointment scheduled as instructed.

## 2016-12-21 ENCOUNTER — Other Ambulatory Visit (INDEPENDENT_AMBULATORY_CARE_PROVIDER_SITE_OTHER): Payer: PPO

## 2016-12-21 DIAGNOSIS — E039 Hypothyroidism, unspecified: Secondary | ICD-10-CM | POA: Diagnosis not present

## 2016-12-21 DIAGNOSIS — Z23 Encounter for immunization: Secondary | ICD-10-CM | POA: Diagnosis not present

## 2016-12-21 LAB — TSH: TSH: 1.58 u[IU]/mL (ref 0.35–4.50)

## 2016-12-23 ENCOUNTER — Encounter: Payer: Self-pay | Admitting: *Deleted

## 2017-03-01 ENCOUNTER — Telehealth: Payer: Self-pay | Admitting: Family Medicine

## 2017-03-01 NOTE — Telephone Encounter (Signed)
Copied from [place] [phone]. Topic: Quick Communication - See Telephone Encounter >> Mar 01, 2017  8:17 AM Arletha Grippe wrote: CRM for notification. See Telephone encounter for:   03/01/17. [facility] - they are wanting to make a brand change for med levothyroxine (SYNTHROID, LEVOTHROID) 100 MCG tablet. Pt has received myland  and now the pharmacy would like to fill with Sand[facility].   Pharmacy would like to know if this Is ok. Cb number is [phone], askfor valerie. if you have to leave VM, please leave first and last name to validate the call .

## 2017-03-01 NOTE — Telephone Encounter (Signed)
Patient's wife notified as instructed by telephone and verbalized understanding.  Left message on voicemail for Mateo Flow at [facility] to call back.Marland Kitchen

## 2017-03-01 NOTE — Telephone Encounter (Signed)
Okay with me for the brand change.  Notify pt/wife.  No change in sig/dose.  Thanks.

## 2017-03-01 NOTE — Telephone Encounter (Signed)
Medication request; see CRM # [phone]

## 2017-03-01 NOTE — Telephone Encounter (Signed)
[name] with [name] called and notified per Dr Josefine Class instruction. [name] voiced understanding; nothing further needed.

## 2017-03-15 ENCOUNTER — Other Ambulatory Visit: Payer: Self-pay | Admitting: Family Medicine

## 2017-03-15 NOTE — Telephone Encounter (Signed)
Sent. Thanks.   

## 2017-03-15 NOTE — Telephone Encounter (Signed)
Electronic refill Last refill 09/01/16 #90 Last office visit 11/07/16

## 2017-04-23 ENCOUNTER — Emergency Department (HOSPITAL_COMMUNITY): Payer: PPO

## 2017-04-23 ENCOUNTER — Other Ambulatory Visit: Payer: Self-pay

## 2017-04-23 ENCOUNTER — Encounter (HOSPITAL_COMMUNITY): Payer: Self-pay | Admitting: Emergency Medicine

## 2017-04-23 ENCOUNTER — Emergency Department (HOSPITAL_COMMUNITY)
Admission: EM | Admit: 2017-04-23 | Discharge: 2017-04-23 | Disposition: A | Discharge status: Home / Self Care | Payer: PPO | Attending: Emergency Medicine | Admitting: Emergency Medicine

## 2017-04-23 DIAGNOSIS — J189 Pneumonia, unspecified organism: Secondary | ICD-10-CM | POA: Insufficient documentation

## 2017-04-23 DIAGNOSIS — Z8673 Personal history of transient ischemic attack (TIA), and cerebral infarction without residual deficits: Secondary | ICD-10-CM | POA: Insufficient documentation

## 2017-04-23 DIAGNOSIS — R319 Hematuria, unspecified: Secondary | ICD-10-CM | POA: Diagnosis not present

## 2017-04-23 DIAGNOSIS — Z96653 Presence of artificial knee joint, bilateral: Secondary | ICD-10-CM | POA: Diagnosis not present

## 2017-04-23 DIAGNOSIS — E039 Hypothyroidism, unspecified: Secondary | ICD-10-CM | POA: Diagnosis not present

## 2017-04-23 DIAGNOSIS — R4182 Altered mental status, unspecified: Secondary | ICD-10-CM | POA: Insufficient documentation

## 2017-04-23 DIAGNOSIS — N390 Urinary tract infection, site not specified: Secondary | ICD-10-CM

## 2017-04-23 DIAGNOSIS — R402441 Other coma, without documented Glasgow coma scale score, or with partial score reported, in the field [EMT or ambulance]: Secondary | ICD-10-CM | POA: Diagnosis not present

## 2017-04-23 DIAGNOSIS — I5032 Chronic diastolic (congestive) heart failure: Secondary | ICD-10-CM | POA: Diagnosis not present

## 2017-04-23 DIAGNOSIS — Z85528 Personal history of other malignant neoplasm of kidney: Secondary | ICD-10-CM | POA: Diagnosis not present

## 2017-04-23 DIAGNOSIS — F0390 Unspecified dementia without behavioral disturbance: Secondary | ICD-10-CM | POA: Diagnosis not present

## 2017-04-23 DIAGNOSIS — J181 Lobar pneumonia, unspecified organism: Secondary | ICD-10-CM | POA: Diagnosis not present

## 2017-04-23 DIAGNOSIS — I110 Hypertensive heart disease with heart failure: Secondary | ICD-10-CM | POA: Insufficient documentation

## 2017-04-23 DIAGNOSIS — Z7902 Long term (current) use of antithrombotics/antiplatelets: Secondary | ICD-10-CM | POA: Diagnosis not present

## 2017-04-23 DIAGNOSIS — Z79899 Other long term (current) drug therapy: Secondary | ICD-10-CM | POA: Diagnosis not present

## 2017-04-23 LAB — URINALYSIS, ROUTINE W REFLEX MICROSCOPIC
Leukocytes, UA: NEGATIVE
NITRITE: NEGATIVE

## 2017-04-23 LAB — URINALYSIS, MICROSCOPIC (REFLEX)

## 2017-04-23 LAB — COMPREHENSIVE METABOLIC PANEL
ALBUMIN: 3.6 g/dL (ref 3.5–5.0)
ALK PHOS: 72 U/L (ref 38–126)
ALT: 10 U/L — ABNORMAL LOW (ref 17–63)
ANION GAP: 9 (ref 5–15)
AST: 21 U/L (ref 15–41)
BILIRUBIN TOTAL: 0.6 mg/dL (ref 0.3–1.2)
BUN: 22 mg/dL — ABNORMAL HIGH (ref 6–20)
CALCIUM: 9.2 mg/dL (ref 8.9–10.3)
CO2: 28 mmol/L (ref 22–32)
Chloride: 104 mmol/L (ref 101–111)
Creatinine, Ser: 0.88 mg/dL (ref 0.61–1.24)
GFR calc Af Amer: >60 mL/min (ref >60)
GFR calc non Af Amer: >60 mL/min (ref >60)
GLUCOSE: 88 mg/dL (ref 65–99)
Potassium: 4.3 mmol/L (ref 3.5–5.1)
Sodium: 141 mmol/L (ref 135–145)
Total Protein: 6.5 g/dL (ref 6.5–8.1)

## 2017-04-23 LAB — CBC WITH DIFFERENTIAL/PLATELET
BASOS PCT: 1 %
Basophils Absolute: 0.1 10*3/uL (ref 0.0–0.1)
Eosinophils Absolute: 0.1 10*3/uL (ref 0.0–0.7)
Eosinophils Relative: 2 %
HEMATOCRIT: 44.7 % (ref 39.0–52.0)
HEMOGLOBIN: 14.8 g/dL (ref 13.0–17.0)
LYMPHS ABS: 1.1 10*3/uL (ref 0.7–4.0)
LYMPHS PCT: 16 %
MCH: 30.4 pg (ref 26.0–34.0)
MCHC: 33.1 g/dL (ref 30.0–36.0)
MCV: 91.8 fL (ref 78.0–100.0)
MONO ABS: 0.5 10*3/uL (ref 0.1–1.0)
MONOS PCT: 7 %
NEUTROS ABS: 5.2 10*3/uL (ref 1.7–7.7)
NEUTROS PCT: 74 %
Platelets: 262 10*3/uL (ref 150–400)
RBC: 4.87 MIL/uL (ref 4.22–5.81)
RDW: 14.1 % (ref 11.5–15.5)
WBC: 7.1 10*3/uL (ref 4.0–10.5)

## 2017-04-23 LAB — I-STAT CG4 LACTIC ACID, ED: LACTIC ACID, VENOUS: 1.48 mmol/L (ref 0.5–1.9)

## 2017-04-23 MED ORDER — SODIUM CHLORIDE 0.9 % IV BOLUS (SEPSIS)
500.0000 mL | Freq: Once | INTRAVENOUS | Status: AC
  Administered 2017-04-23: 500 mL via INTRAVENOUS

## 2017-04-23 MED ORDER — LEVOFLOXACIN 500 MG PO TABS: 500.0000 mg | ORAL_TABLET | Freq: Every day | ORAL | 0 refills | Status: AC

## 2017-04-23 MED ORDER — DEXTROSE 5 % IV SOLN
500.0000 mg | Freq: Once | INTRAVENOUS | Status: AC
  Administered 2017-04-23: 500 mg via INTRAVENOUS
  Filled 2017-04-23: qty 500

## 2017-04-23 MED ORDER — CEFTRIAXONE SODIUM 1 G IJ SOLR
1.0000 g | Freq: Once | INTRAMUSCULAR | Status: AC
  Administered 2017-04-23: 1 g via INTRAVENOUS
  Filled 2017-04-23: qty 10

## 2017-04-23 NOTE — ED Triage Notes (Signed)
Pt in from home via [facility] with AMS and hematuria that began yesterday afternoon per family. Pt takes Plavix, EMS reports burgundy tinged urine. Pt is alert to self only. CBG 108, denies pain with urination, BP 168/101.

## 2017-04-23 NOTE — ED Provider Notes (Signed)
[facility] EMERGENCY DEPARTMENT Provider Note   CSN: [id number] Arrival date & time: 04/23/17  1017     History   Chief Complaint Chief Complaint  Patient presents with  . Altered Mental Status  . Hematuria    HPI [name] is a 84 y.o. male.  HPI   84yo with history of htn, chol, CVA, thoracic aneurysm, dementia, who presents with concern for hematuria and altered mental status.  Wife reports that he has some increased confusion from baseline over the last few days.  Reports that last night, he began to have hematuria.  Described as dark in color.  Patient is on Plavix, however no other anticoagulation.  He does have a history of hematuria with prior UTIs in the past, and does have a urologist.  He has not had difficulty urinating, or symptoms of retention.  He has some passed some small blood clots.  Wife also reports that he has had some more "rattly" sounding breathing.  Reports at baseline, he is not ambulatory, and her sons help him with transfers, and she has home health also come up to the house.  Wife reports he is not always a reliable historian but has not had any specific concerns recently including no abdominal pain, cough, back pain, vomiting or diarrhea. No fevers at home.  Past Medical History:  Diagnosis Date  . BPH (benign prostatic hypertrophy)   . Dementia   . Diverticulosis of colon   . History of shingles   . Hypertension   . Hypothyroidism   . Multiple fractures   . Pancreatitis 2011   and gallstone pancreatitis and Ecoli bacteremia  . Pneumonia    2016  . Rabbit fever 1940  . Renal mass 2011   Eval by [name] echogenic mass on u/s. followed by [name]- observation as of 09/2010  . Stroke ([place]) 02/2015   with L sided weakness.     Patient Active Problem List   Diagnosis Date Noted  . Hypokalemia 11/08/2016  . Community acquired pneumonia of left lung   . Lobar pneumonia ([place]) 10/24/2016  . Acute lower UTI 10/24/2016  .  Atypical chest pain 10/24/2016  . History of CVA (cerebrovascular accident) 10/24/2016  . Hypothyroid 10/24/2016  . Thoracic aortic aneurysm without rupture ([place])   . Gross hematuria 10/04/2016  . Syncope 06/29/2016  . History of sepsis 12/29/2015  . Bacteremia due to Pseudomonas   . Renal cell cancer ([place])   . Dysuria 12/15/2015  . Cough 09/09/2015  . Orthostatic hypotension 04/29/2015  . Dementia 04/29/2015  . Femur fracture, left ([place]) 04/02/2015  . Distal radial fracture 04/02/2015  . Acute blood loss anemia, post operative  03/17/2015  . Leukocytosis 03/16/2015  . Chronic diastolic CHF (congestive heart failure), NYHA class 1 ([place]) 03/16/2015  . Chronic constipation, unspecified  03/16/2015  . Left radial fracture 12020/01/1715  . Recent right MCA stroke ([place]) 02/09/2015  . Essential hypertension 02/09/2015  . Lewy body dementia without behavioral disturbance   . Vomiting 05/11/2014  . Ascending aortic aneurysm ([place]) 07/19/2012  . Barrett's esophagus 09/28/2006    Past Surgical History:  Procedure Laterality Date  . APPENDECTOMY  1959  . CHOLECYSTECTOMY  10/2009  . EYE MUSCLE SURGERY     12/12 had double vision Dr. Juleen [facility]  . JOINT REPLACEMENT  04/30/2001   bilateral TKR  . KNEE ARTHROSCOPY  06/1995   left Dr Derrel Nip  . OPEN REDUCTION INTERNAL FIXATION (ORIF) DISTAL RADIAL FRACTURE  Left 03/16/2015   Procedure: OPEN REDUCTION INTERNAL FIXATION (ORIF) DISTAL RADIAL FRACTURE;  Surgeon: Iran Planas, MD;  Location: [facility];  Service: Orthopedics;  Laterality: Left;  . ORIF FEMUR FRACTURE Left 03/16/2015   Procedure: OPEN REDUCTION INTERNAL FIXATION (ORIF) DISTAL FEMUR FRACTURE;  Surgeon: Paralee Cancel, MD;  Location: [facility];  Service: Orthopedics;  Laterality: Left;  . ORIF HIP FRACTURE Left 12/26/2014   Procedure: OPEN REDUCTION INTERNAL FIXATION HIP;  Surgeon: Paralee Cancel, MD;  Location: [facility];  Service: Orthopedics;  Laterality: Left;  . THYROIDECTOMY, PARTIAL  1966        Home Medications    Prior to Admission medications   Medication Sig Start Date End Date Taking? Authorizing Provider  B Complex-C (B-COMPLEX WITH VITAMIN C) tablet Take 1 tablet by mouth daily. 07/13/12  Yes Rai, Ripudeep K, MD  clopidogrel (PLAVIX) 75 MG tablet Take 1 tablet by mouth daily Patient taking differently: Take 1 tablet (75 mg)  by mouth daily 03/15/17  Yes Tonia Ghent, MD  diphenhydramine-acetaminophen (TYLENOL PM) 25-500 MG TABS tablet Take 2 tablets by mouth at bedtime as needed (for mild pain).   Yes [provider]  docusate sodium (COLACE) 100 MG capsule Take 100 mg by mouth 2 (two) times daily.   Yes [provider]  donepezil (ARICEPT) 10 MG tablet Take 1 tablet by mouth daily Patient taking differently: Take 1 tablet (10 mg) by mouth daily 12/14/16  Yes Tonia Ghent, MD  escitalopram (LEXAPRO) 5 MG tablet Take 1 tablet by mouth daily Patient taking differently: Take 1 tablet (5mg ) by mouth daily in the morning 12/14/16  Yes Tonia Ghent, MD  IRON PO Take 1 tablet by mouth 2 (two) times daily.    Yes [provider]  levothyroxine (SYNTHROID, LEVOTHROID) 100 MCG tablet Take 1 tablet by mouth daily Patient taking differently: Take 1 tablet (100 mcg) by mouth daily 12/14/16  Yes Tonia Ghent, MD  Multiple Vitamin (MULTIVITAMIN) tablet Take 1 tablet by mouth daily.     Yes [provider]  ondansetron (ZOFRAN) 4 MG tablet Take 1 tablet (4 mg total) by mouth every 8 (eight) hours as needed for nausea or vomiting. 07/13/16  Yes Mesner, Corene Cornea, MD  pantoprazole (PROTONIX) 40 MG tablet Take 1 tablet by mouth daily Patient taking differently: Take 1 tablet (40 mg totally) by mouth daily 08/09/16  Yes Tonia Ghent, MD  polyethylene glycol (MIRALAX / GLYCOLAX) packet Take 17 g by mouth daily. Patient taking differently: Take 17 g by mouth daily as needed for mild constipation.  12/30/14  Yes Rama, Venetia Maxon, MD  Potassium  99 MG TABS Take 99 mg by mouth daily.    Yes [provider]  Probiotic Product (PROBIOTIC PO) Take 1 tablet by mouth daily.   Yes [provider]  levofloxacin (LEVAQUIN) 500 MG tablet Take 1 tablet (500 mg total) by mouth daily for 7 days. 04/23/17 04/30/17  Gareth Morgan, MD    Family History Family History  Problem Relation Age of Onset  . Hypertension Mother   . Stroke Mother   . Alcohol abuse Father   . Cancer Brother        prostate CA  . Heart disease Brother     Social History Social History   Tobacco Use  . Smoking status: Never Smoker  . Smokeless tobacco: Never Used  Substance Use Topics  . Alcohol use: No    Alcohol/week: 0.0 oz  . Drug  use: No     Allergies   Sulfa antibiotics and Valtrex [valacyclovir hcl]   Review of Systems Review of Systems  Unable to perform ROS: Dementia  Constitutional: Negative for fever.  Respiratory: Negative for cough.   Cardiovascular: Negative for chest pain.  Gastrointestinal: Negative for abdominal pain and vomiting.  Genitourinary: Positive for hematuria.  Skin: Negative for rash.  Neurological: Negative for numbness.  Psychiatric/Behavioral: Positive for confusion.     Physical Exam Updated Vital Signs BP (!) 169/85   Pulse (!) 54   Temp 97.9 F (36.6 C) (Oral)   Resp 20   Wt 55.3 kg (122 lb)   SpO2 95%   BMI 18.55 kg/m   Physical Exam  Constitutional: He appears well-developed and well-nourished. No distress.  HENT:  Head: Normocephalic and atraumatic.  Eyes: Conjunctivae and EOM are normal.  Neck: Normal range of motion.  Cardiovascular: Normal rate, regular rhythm, normal heart sounds and intact distal pulses. Exam reveals no gallop and no friction rub.  No murmur heard. Pulmonary/Chest: Effort normal and breath sounds normal. No respiratory distress. He has no wheezes. He has no rales.  Abdominal: Soft. He exhibits no distension. There is no tenderness. There is no guarding.   Musculoskeletal: He exhibits no edema.  Neurological: He is alert.  Skin: Skin is warm and dry. He is not diaphoretic.  Nursing note and vitals reviewed.    ED Treatments / Results  Labs (all labs ordered are listed, but only abnormal results are displayed) Labs Reviewed  COMPREHENSIVE METABOLIC PANEL - Abnormal; Notable for the following components:      Result Value   BUN 22 (*)    ALT 10 (*)    All other components within normal limits  URINALYSIS, ROUTINE W REFLEX MICROSCOPIC - Abnormal; Notable for the following components:   Color, Urine RED (*)    APPearance TURBID (*)    Glucose, UA   (*)    Value: TEST NOT REPORTED DUE TO COLOR INTERFERENCE OF URINE PIGMENT   Hgb urine dipstick   (*)    Value: TEST NOT REPORTED DUE TO COLOR INTERFERENCE OF URINE PIGMENT   Bilirubin Urine   (*)    Value: TEST NOT REPORTED DUE TO COLOR INTERFERENCE OF URINE PIGMENT   Ketones, ur   (*)    Value: TEST NOT REPORTED DUE TO COLOR INTERFERENCE OF URINE PIGMENT   Protein, ur   (*)    Value: TEST NOT REPORTED DUE TO COLOR INTERFERENCE OF URINE PIGMENT   All other components within normal limits  URINALYSIS, MICROSCOPIC (REFLEX) - Abnormal; Notable for the following components:   Bacteria, UA PRESENT (*)    Squamous Epithelial / LPF 0-5 (*)    All other components within normal limits  URINE CULTURE  CBC WITH DIFFERENTIAL/PLATELET  I-STAT CG4 LACTIC ACID, ED  I-STAT CG4 LACTIC ACID, ED    EKG  EKG Interpretation None       Radiology Dg Chest 2 View  Result Date: 04/23/2017 CLINICAL DATA:  Hematuria and altered mental status EXAM: CHEST  2 VIEW COMPARISON:  Chest radiograph and chest CT October 24, 2016 FINDINGS: There is an apparent nipple shadow on the right. There is consolidation in the left lower lobe posteriorly with small left pleural effusion. The lungs elsewhere clear. Heart is upper normal in size with pulmonary vascularity within normal limits. No adenopathy. Bones are  osteoporotic. There is degenerative change in each shoulder with superior migration of each humeral head. IMPRESSION:  Posterior left base airspace consolidation consistent with pneumonia. Small left pleural effusion. Lungs elsewhere clear. Heart upper normal in size. Bones osteoporotic. Superior migration of each humeral head, likely indicating chronic rotator cuff tears. Followup PA and lateral chest radiographs recommended in 3-4 weeks following trial of antibiotic therapy to ensure resolution and exclude underlying malignancy. Electronically Signed   By: Lowella Grip III M.D.   On: 04/23/2017 12:21    Procedures Procedures (including critical care time)  Medications Ordered in ED Medications  azithromycin (ZITHROMAX) 500 mg in dextrose 5 % 250 mL IVPB (500 mg Intravenous New Bag/Given 04/23/17 1425)  sodium chloride 0.9 % bolus 500 mL (0 mLs Intravenous Stopped 04/23/17 1438)  cefTRIAXone (ROCEPHIN) 1 g in dextrose 5 % 50 mL IVPB (0 g Intravenous Stopped 04/23/17 1417)     Initial Impression / Assessment and Plan / ED Course  I have reviewed the triage vital signs and the nursing notes.  Pertinent labs & imaging results that were available during my care of the patient were reviewed by me and considered in my medical decision making (see chart for details).     84yo with history of htn, chol, CVA, thoracic aneurysm, dementia, who presents with concern for hematuria and altered mental status.  Patient well-appearing, vital signs without significant abnormalities and is afebrile in the emergency department.  He is noted to have hematuria, however is passing urine without issues.  Renal function is within normal limits and hemoglobin is within normal limits.  Hematuria is most likely secondary to urinary tract infection, patient has a history of the same in the past.  He is passing urine without signs of obstruction.  Recommend to continue hydration and treatment of UTI, with follow-up with  urology for hematuria particularly if symptoms continue.    Chest x-ray also shows concern for left-sided pneumonia.  Patient with normal white count, no tachycardia, no hypertension, no lactic acidosis, is well-appearing, without signs of sepsis, and is appropriate for outpatient management of UTI and pneumonia.  Discussed this with wife, who is comfortable with plan.  He is not significantly encephalopathic from baseline.  Initially given Rocephin and azithromycin in the emergency department, however will discharge on Levaquin for coverage of both pneumonia and UTI.  Recommend follow-up x-ray in 4-6 weeks for resolution of pneumonia.  Recommend close primary care physician follow-up, discussed reasons to return in detail.  Final Clinical Impressions(s) / ED Diagnoses   Final diagnoses:  Hematuria, unspecified type  Lower urinary tract infectious disease  Community acquired pneumonia of left lung, unspecified part of lung    ED Discharge Orders        Ordered    levofloxacin (LEVAQUIN) 500 MG tablet  Daily     04/23/17 1450       Gareth Morgan, MD 04/23/17 1510

## 2017-04-24 LAB — URINE CULTURE

## 2017-05-12 ENCOUNTER — Other Ambulatory Visit: Payer: Self-pay | Admitting: Family Medicine

## 2017-05-25 ENCOUNTER — Other Ambulatory Visit: Payer: Self-pay | Admitting: Family Medicine

## 2017-05-25 DIAGNOSIS — R9389 Abnormal findings on diagnostic imaging of other specified body structures: Secondary | ICD-10-CM

## 2017-05-31 ENCOUNTER — Ambulatory Visit
Admission: RE | Admit: 2017-05-31 | Discharge: 2017-05-31 | Disposition: A | Discharge status: Home / Self Care | Payer: PPO | Source: Ambulatory Visit | Attending: Family Medicine | Admitting: Family Medicine

## 2017-05-31 DIAGNOSIS — R9389 Abnormal findings on diagnostic imaging of other specified body structures: Secondary | ICD-10-CM

## 2017-06-01 ENCOUNTER — Ambulatory Visit (INDEPENDENT_AMBULATORY_CARE_PROVIDER_SITE_OTHER)
Admission: RE | Admit: 2017-06-01 | Discharge: 2017-06-01 | Disposition: A | Discharge status: Home / Self Care | Payer: PPO | Source: Ambulatory Visit | Attending: Family Medicine | Admitting: Family Medicine

## 2017-06-01 ENCOUNTER — Other Ambulatory Visit: Payer: Self-pay | Admitting: Family Medicine

## 2017-06-01 ENCOUNTER — Encounter: Payer: Self-pay | Admitting: Family Medicine

## 2017-06-01 ENCOUNTER — Ambulatory Visit (INDEPENDENT_AMBULATORY_CARE_PROVIDER_SITE_OTHER): Payer: PPO | Admitting: Family Medicine

## 2017-06-01 DIAGNOSIS — N390 Urinary tract infection, site not specified: Secondary | ICD-10-CM

## 2017-06-01 DIAGNOSIS — R9389 Abnormal findings on diagnostic imaging of other specified body structures: Secondary | ICD-10-CM

## 2017-06-01 DIAGNOSIS — J189 Pneumonia, unspecified organism: Secondary | ICD-10-CM | POA: Diagnosis not present

## 2017-06-01 DIAGNOSIS — J90 Pleural effusion, not elsewhere classified: Secondary | ICD-10-CM | POA: Diagnosis not present

## 2017-06-01 MED ORDER — POLYETHYLENE GLYCOL 3350 17 G PO PACK: 17.0000 g | PACK | Freq: Every day | ORAL | Status: AC | PRN

## 2017-06-01 MED ORDER — ESCITALOPRAM OXALATE 5 MG PO TABS: ORAL_TABLET | ORAL | Status: DC

## 2017-06-01 MED ORDER — LEVOTHYROXINE SODIUM 100 MCG PO TABS: ORAL_TABLET | ORAL | Status: DC

## 2017-06-01 MED ORDER — CLOPIDOGREL BISULFATE 75 MG PO TABS: ORAL_TABLET | ORAL | Status: DC

## 2017-06-01 MED ORDER — DONEPEZIL HCL 10 MG PO TABS: ORAL_TABLET | ORAL | Status: DC

## 2017-06-01 NOTE — Patient Instructions (Signed)
Go to the lab on the way out.  We'll contact you with your xray report. Don't change your meds for now.  Update me as needed.  Take care.  Glad to see you.  

## 2017-06-04 ENCOUNTER — Other Ambulatory Visit: Payer: Self-pay | Admitting: Family Medicine

## 2017-06-04 DIAGNOSIS — J189 Pneumonia, unspecified organism: Secondary | ICD-10-CM

## 2017-06-04 NOTE — Assessment & Plan Note (Signed)
Persistent left base infiltrate and left-sided pleural effusion. Continued follow-up exams to demonstrate clearing suggested. I reviewed the x-ray.  See note on x-ray report.  I would not treat with antibiotics at this point given his clinical improvement.

## 2017-06-04 NOTE — Progress Notes (Signed)
ER follow-up.  History of dementia at baseline.  Wife is caring for the patient at home and she has extra home care coming in 3 days a week.  That has been useful for her.  The patient had a likely UTI with hematuria that was noted on previous evaluation at the emergency room.  He was also incidentally found to have a pneumonia.  He is clinically better with resolution of hematuria.  He is not short of breath.  He does not have a fever.  Done with antibiotic course.  He presents for routine follow-up today.  He still has dementia at baseline.  He has other issues and performance status appear to be at baseline, ie in wheelchair, needing assist with almost all activities.  Meds, vitals, and allergies reviewed.   ROS: Per HPI unless specifically indicated in ROS section   GEN: nad, alert but not oriented to time.  He is able to answer in a few short words, this is his baseline.  In wheelchair HEENT: mucous membranes moist NECK: supple w/o LA CV: rrr.  PULM: ctab, no inc wob, no focal decrease in breath sounds ABD: soft, +bs EXT: no edema

## 2017-06-04 NOTE — Assessment & Plan Note (Signed)
He has no symptoms at this point.  Previous hematuria resolved.  I would not workup further unless he had return of symptoms.  Discussed with patient and wife.  They agree.

## 2017-06-12 ENCOUNTER — Other Ambulatory Visit: Payer: Self-pay | Admitting: Family Medicine

## 2017-06-20 ENCOUNTER — Other Ambulatory Visit: Payer: Self-pay | Admitting: Family Medicine

## 2017-06-22 ENCOUNTER — Telehealth: Payer: Self-pay | Admitting: Family Medicine

## 2017-06-22 NOTE — Telephone Encounter (Signed)
Will route to office for provider review. 

## 2017-06-22 NOTE — Telephone Encounter (Signed)
Copied from [place] [phone]. Topic: Quick Communication - Rx Refill/Question >> Jun 22, 2017  8:42 AM Scherrie Gerlach wrote: Medication: levothyroxine (SYNTHROID, LEVOTHROID) 100 MCG tablet envision calling to ask if ok to change the manufactures of this med?  Last time was [place].   Call Merrilee Seashore: [phone]  (ok to leave message )

## 2017-06-23 NOTE — Telephone Encounter (Signed)
Neck was given the ok per Dr. Damita Dunnings.

## 2017-06-23 NOTE — Telephone Encounter (Signed)
Should be okay. No change in rx/dose/sig o/w.  Thanks.

## 2017-07-04 ENCOUNTER — Encounter: Payer: Self-pay | Admitting: Family Medicine

## 2017-07-04 ENCOUNTER — Ambulatory Visit (INDEPENDENT_AMBULATORY_CARE_PROVIDER_SITE_OTHER): Payer: PPO | Admitting: Family Medicine

## 2017-07-04 ENCOUNTER — Ambulatory Visit (INDEPENDENT_AMBULATORY_CARE_PROVIDER_SITE_OTHER)
Admission: RE | Admit: 2017-07-04 | Discharge: 2017-07-04 | Disposition: A | Discharge status: Home / Self Care | Payer: PPO | Source: Ambulatory Visit | Attending: Family Medicine | Admitting: Family Medicine

## 2017-07-04 DIAGNOSIS — J90 Pleural effusion, not elsewhere classified: Secondary | ICD-10-CM | POA: Diagnosis not present

## 2017-07-04 DIAGNOSIS — J189 Pneumonia, unspecified organism: Secondary | ICD-10-CM

## 2017-07-04 NOTE — Patient Instructions (Signed)
Go to the lab on the way out.  We'll contact you with your xray report. We'll go from there.  Take care.  Glad to see you.

## 2017-07-06 NOTE — Assessment & Plan Note (Signed)
Follow-up chest x-ray pending.  I talked with his wife about the differential diagnosis of the chest x-ray abnormality.  He could have developed a chronic effusion.  The possibility of mass exists.  That is the reason to recheck x-ray today.  See notes on imaging.  She agrees.  His dementia and overall condition likely limits aggressive intervention.

## 2017-07-06 NOTE — Progress Notes (Signed)
History of pneumonia previously documented on chest x-ray, he did not have resolution of abnormal imaging on previous chest x-ray.  Here for follow-up chest x-ray.  He is not had any fevers.  Not on antibiotics.  He does have some yellow sputum baseline.  This is been going on for years.  His dementia continues.  He has more confusion.  Family helps at home.  He has extra in home care coming in several days a week.  He occasionally vomits.  This happens a few times per month.  This usually happens without warning and has been going on chronically for an extended period of time.  He has not been overtly short of breath.  He has been at baseline otherwise.  His wife is able to get out of the house to be able to go to church, discussed.    Meds, vitals, and allergies reviewed.   ROS: Per HPI unless specifically indicated in ROS section   GEN: nad, alert, he responds in 1-2 word answers.  He is in wheelchair.  It is not clear that the patient recognizes me. HEENT: mucous membranes moist NECK: supple w/o LA CV: rrr.  PULM: ctab, no inc wob, cough noted with some light yellow sputum noted ABD: soft, +bs EXT: no edema

## 2017-08-15 ENCOUNTER — Other Ambulatory Visit: Payer: Self-pay | Admitting: Family Medicine

## 2017-08-28 IMAGING — CR DG KNEE 1-2V*L*
2 series · 2 of 2 positions shown · non-contrast
Comparison: 11/29/2014

CLINICAL DATA: [place], patient [place] injuring LEFT
knee, history of dementia and knee replacement surgery

EXAM:
LEFT KNEE - 1-2 VIEW

[x knee lat left]
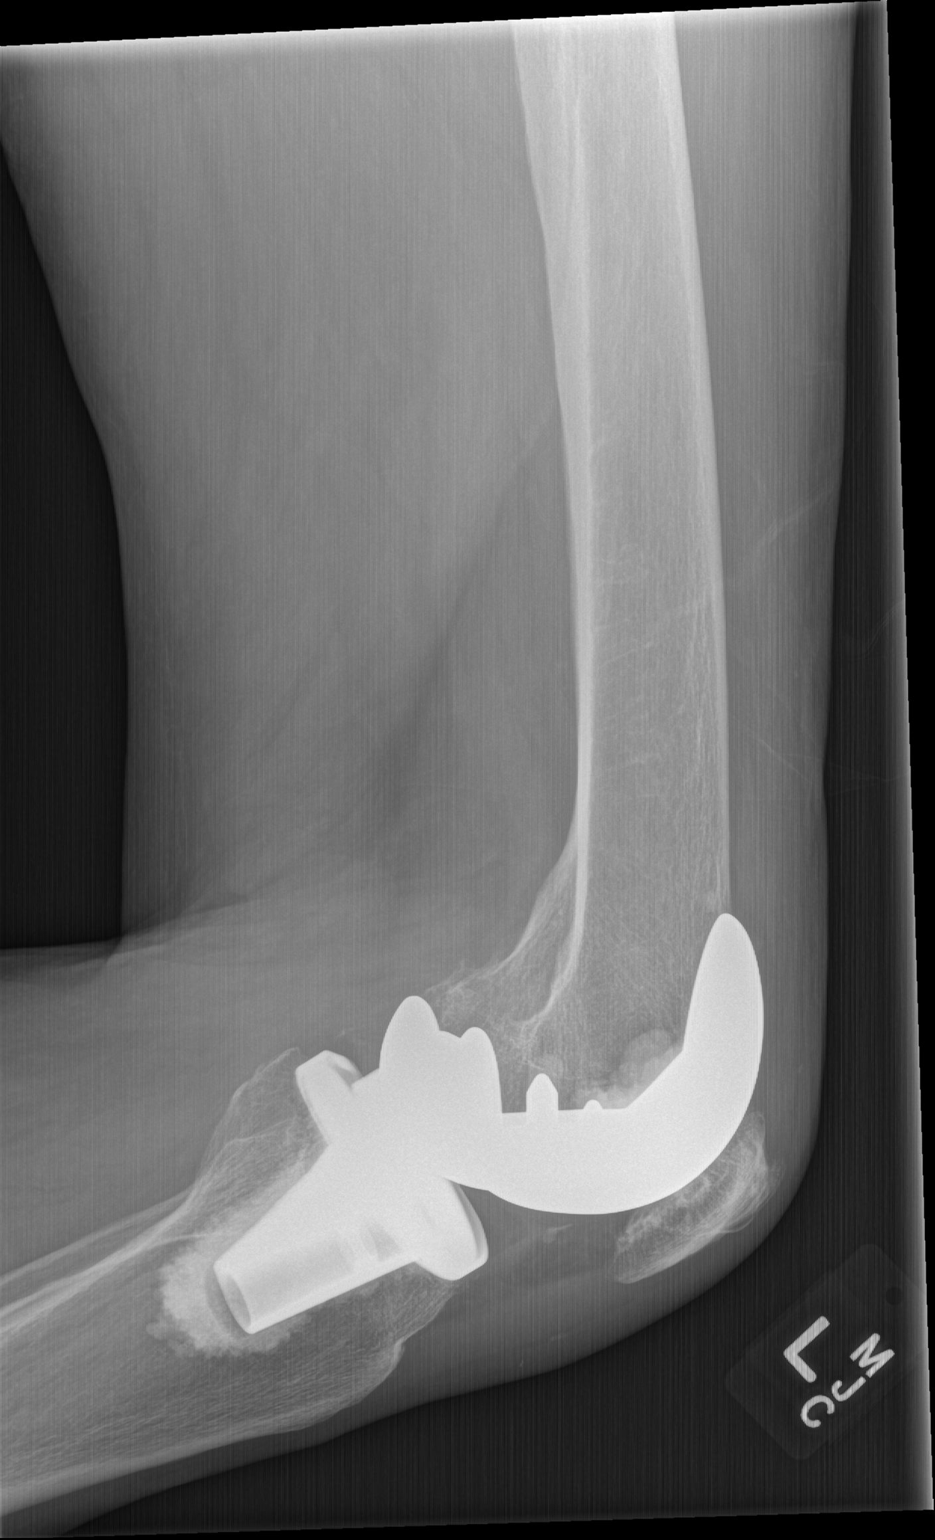

[x knee ap left]
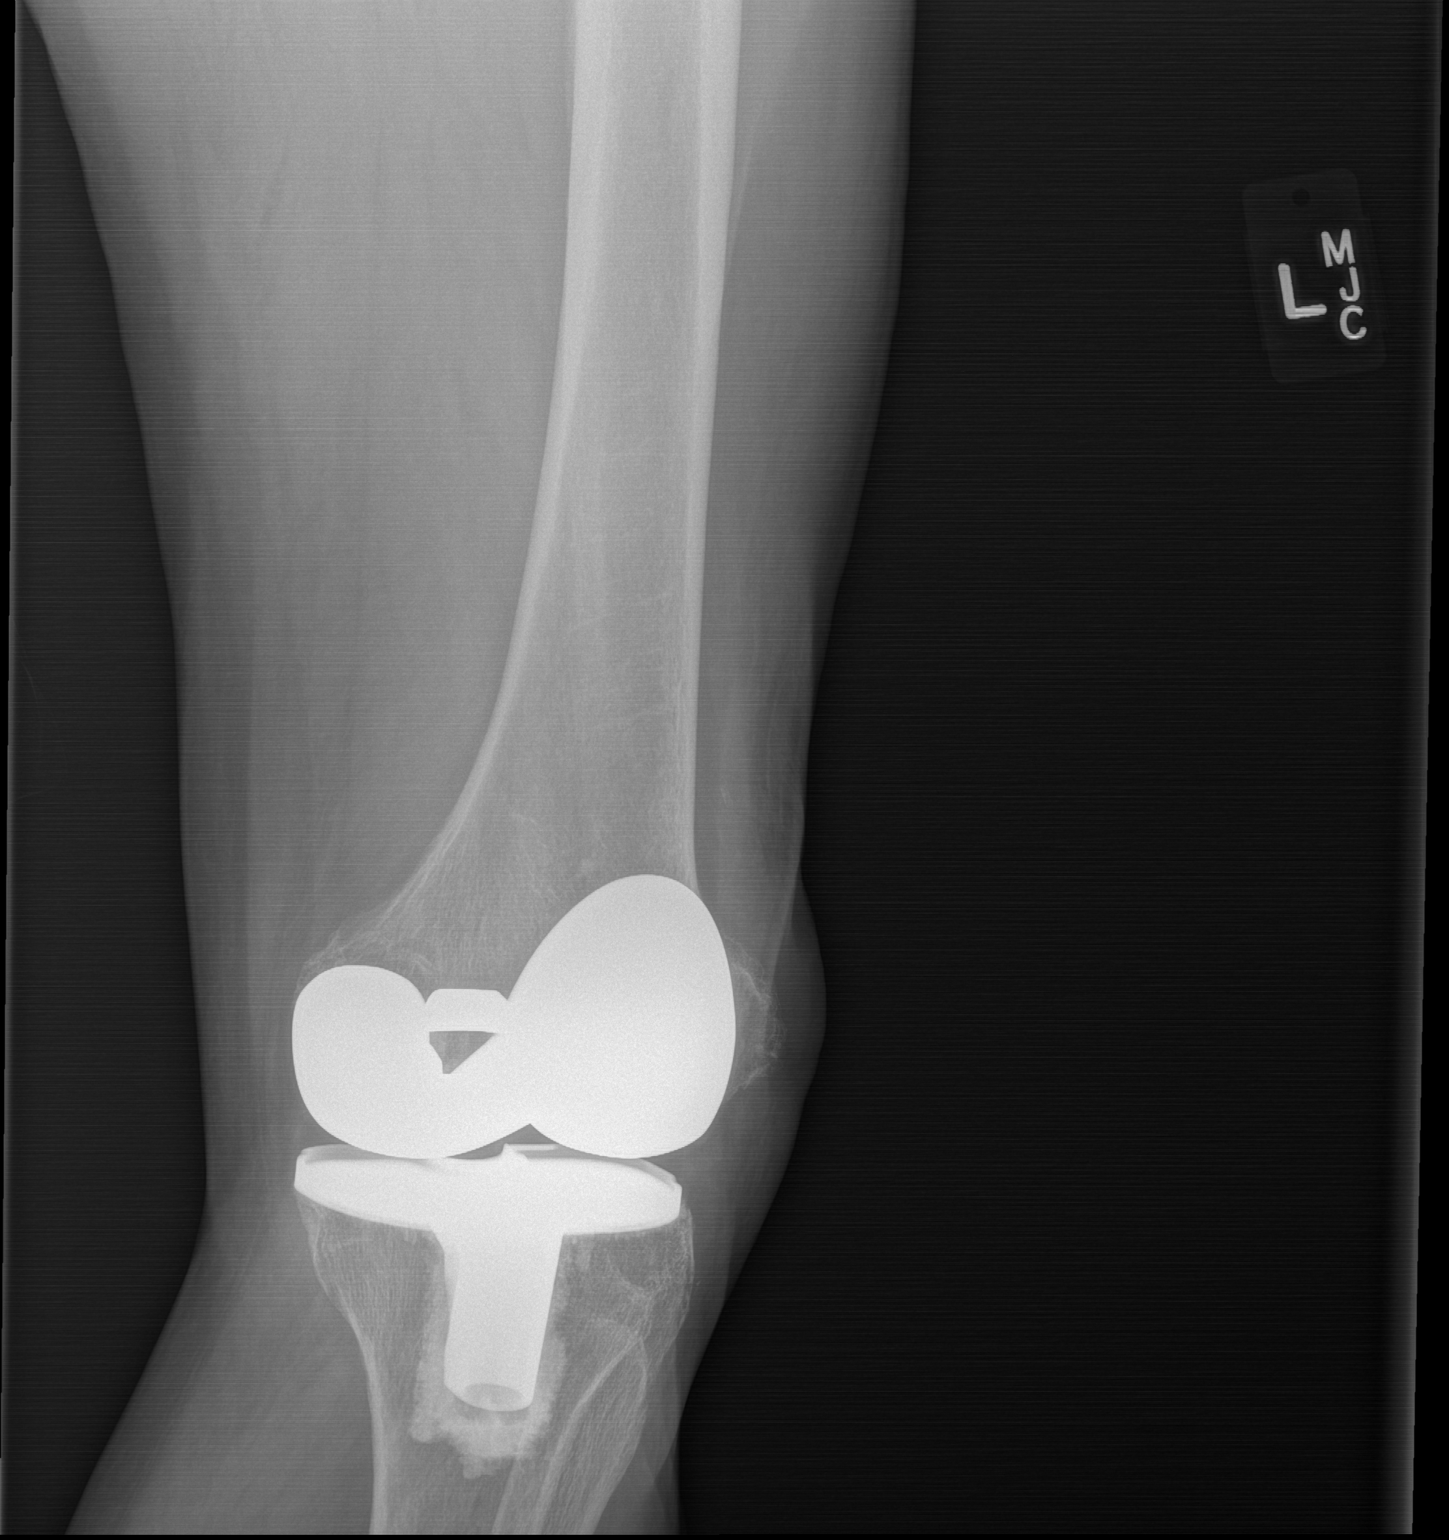

[2 of 2 positions shown; findings below may reference images not displayed]

FINDINGS: Diffuse osseous demineralization.

Components of LEFT knee prosthesis in expected positions.

No definite fracture, dislocation or bone destruction.

No periprosthetic lucency or joint effusion.
IMPRESSION: LEFT knee prosthesis and osseous demineralization without acute
abnormalities.

## 2017-09-04 ENCOUNTER — Ambulatory Visit: Payer: Self-pay | Admitting: *Deleted

## 2017-09-04 MED ORDER — AMOXICILLIN-POT CLAVULANATE 875-125 MG PO TABS: 1.0000 | ORAL_TABLET | Freq: Two times a day (BID) | ORAL | 0 refills | Status: DC

## 2017-09-04 NOTE — Telephone Encounter (Signed)
The goal would be to treat a condition in a timely manner to help make the patient more comfortable.  Ideally we would get patient into clinic.  This situation is atypical with extenuating circumstances.  Would start augmentin, rx sent, but I would still like to try to check patient this week if not today (sooner rather than later).  Please give ER cautions re: SOB/AMS/etc. Thanks.

## 2017-09-04 NOTE — Telephone Encounter (Signed)
Wife advised, appointment scheduled.

## 2017-09-04 NOTE — Telephone Encounter (Signed)
I spoke with [name] (DPR signed); [name] has hematuria and confusion; also prod cough with greenish phlegm; hears rattles in chest when sleeping but no difficulty breathing. Temp and BP this AM 98 and 156/92. [name] had similar symptoms first of year and had pneumonia and UTI. [name] has to be lifted by son to come in for appt. appt offered to [name] by another provider this afternoon but [name] said will only see [name]. Mrs Caponigro wants to know if abx could be called in since [name] knows [name] so well.Please advise. [facility].

## 2017-09-04 NOTE — Addendum Note (Signed)
Addended by: Tonia Ghent on: 09/04/2017 01:59 PM   Modules accepted: Orders

## 2017-09-04 NOTE — Telephone Encounter (Signed)
Pt wife called stating that over the weekend pt had blood in urine seems very confused -pt wife says that pt had same symptoms in April and since Damita Dunnings is not available pt wife is wanting to know if something can be called in  Best number [address] Wife is not sure she can bring patient into office- he has to be lifted.She will have to contact her son to help her.  Reason for Disposition . Patient sounds very sick or weak to the triager  Answer Assessment - Initial Assessment Questions 1. SYMPTOM: "What's the main symptom you're concerned about?" (e.g., frequency, incontinence)     Blood in urine, confusion 2. ONSET: "When did the  ________  start?"     Saturday night 3. PAIN: "Is there any pain?" If so, ask: "How bad is it?" (Scale: 1-10; mild, moderate, severe)     Has not complained 4. CAUSE: "What do you think is causing the symptoms?"     Possible UTI 5. OTHER SYMPTOMS: "Do you have any other symptoms?" (e.g., fever, flank pain, blood in urine, pain with urination)     Confusion, BP- 156/92 6. PREGNANCY: "Is there any chance you are pregnant?" "When was your last menstrual period?"     n/a  Protocols used: URINE - BLOOD IN-A-AH, URINARY [facility]

## 2017-09-05 ENCOUNTER — Other Ambulatory Visit: Payer: Self-pay | Admitting: *Deleted

## 2017-09-05 ENCOUNTER — Encounter: Payer: Self-pay | Admitting: Family Medicine

## 2017-09-05 ENCOUNTER — Ambulatory Visit (INDEPENDENT_AMBULATORY_CARE_PROVIDER_SITE_OTHER): Payer: PPO | Admitting: Family Medicine

## 2017-09-05 DIAGNOSIS — N390 Urinary tract infection, site not specified: Secondary | ICD-10-CM | POA: Diagnosis not present

## 2017-09-05 MED ORDER — BENZONATATE 200 MG PO CAPS: 200.0000 mg | ORAL_CAPSULE | Freq: Three times a day (TID) | ORAL | 0 refills | Status: DC | PRN

## 2017-09-05 MED ORDER — AMOXICILLIN-POT CLAVULANATE 875-125 MG PO TABS: 1.0000 | ORAL_TABLET | Freq: Two times a day (BID) | ORAL | 0 refills | Status: DC

## 2017-09-05 MED ORDER — BENZONATATE 200 MG PO CAPS: 200.0000 mg | ORAL_CAPSULE | Freq: Three times a day (TID) | ORAL | 0 refills | Status: AC | PRN

## 2017-09-05 NOTE — Patient Instructions (Addendum)
I apologize for not having the medicine send to the pharmacy yesterday.  Start augmentin.  Use tessalon if a cough suppressant is needed.  Update me as needed.  Take care.  Glad to see you.

## 2017-09-05 NOTE — Progress Notes (Signed)
Family recently saw bright red blood recently in urine. He has more sputum, greenish, atypical for patient- this isn't his baseline.  No fevers.  He tends to have lower BPs once he had been up for part of the day.  Higher BP supine at baseline.  He has been more confused recently.    He is max assist at baseline.    D/w pt/wife about my error-I didn't send abx yesterday- I thought it was sent to pharmacy but it wasn't.  I apologized about this.  Wife accepted my apology.    Meds, vitals, and allergies reviewed.   ROS: Per HPI unless specifically indicated in ROS section   ncat Thin elderly male in NAD with occ cough but no inc in wob Mmm Neck supple, no LA rrr Dec BS at the bases but ctab o/w.   abd soft Ext w/o edema Skin well perfused.   Speaking 1-2 words at a time.  He is able to follow simple commands.  Defer cxr and labs at this point- it likely wouldn't change mgmt.  Wife agrees.

## 2017-09-06 NOTE — Assessment & Plan Note (Signed)
The presumption is that he has a UTI and possibly pneumonia.  Discussed with wife about options.  It is unlikely that we can get a reliable urine sample without causing discomfort to the patient.  If he tries to give a regular urinalysis it would likely be contaminated.  It is likely not worth trying to catheterize the patient, out of consideration for his comfort.  We are going to put him on antibiotics even if his urine culture were negative, given his lung exam.  At this point an x-ray is likely not going to change management.  Reasonable to continue supportive care, use Tessalon as needed for cough, start Augmentin, and update me as needed.  All questions answered.  All in agreement with plan.  At this point still okay for outpatient follow-up.

## 2017-10-10 ENCOUNTER — Telehealth: Payer: Self-pay | Admitting: Family Medicine

## 2017-10-10 DIAGNOSIS — G3183 Dementia with Lewy bodies: Secondary | ICD-10-CM

## 2017-10-10 DIAGNOSIS — F0280 Dementia in other diseases classified elsewhere without behavioral disturbance: Secondary | ICD-10-CM

## 2017-10-10 DIAGNOSIS — N390 Urinary tract infection, site not specified: Secondary | ICD-10-CM

## 2017-10-10 IMAGING — CR DG CHEST 2V
2 series · 2 of 2 positions shown · non-contrast
Comparison: 12/26/2014

CLINICAL DATA: Community-acquired pneumonia

EXAM:
CHEST  2 VIEW

[view not recorded (1 of 2)]
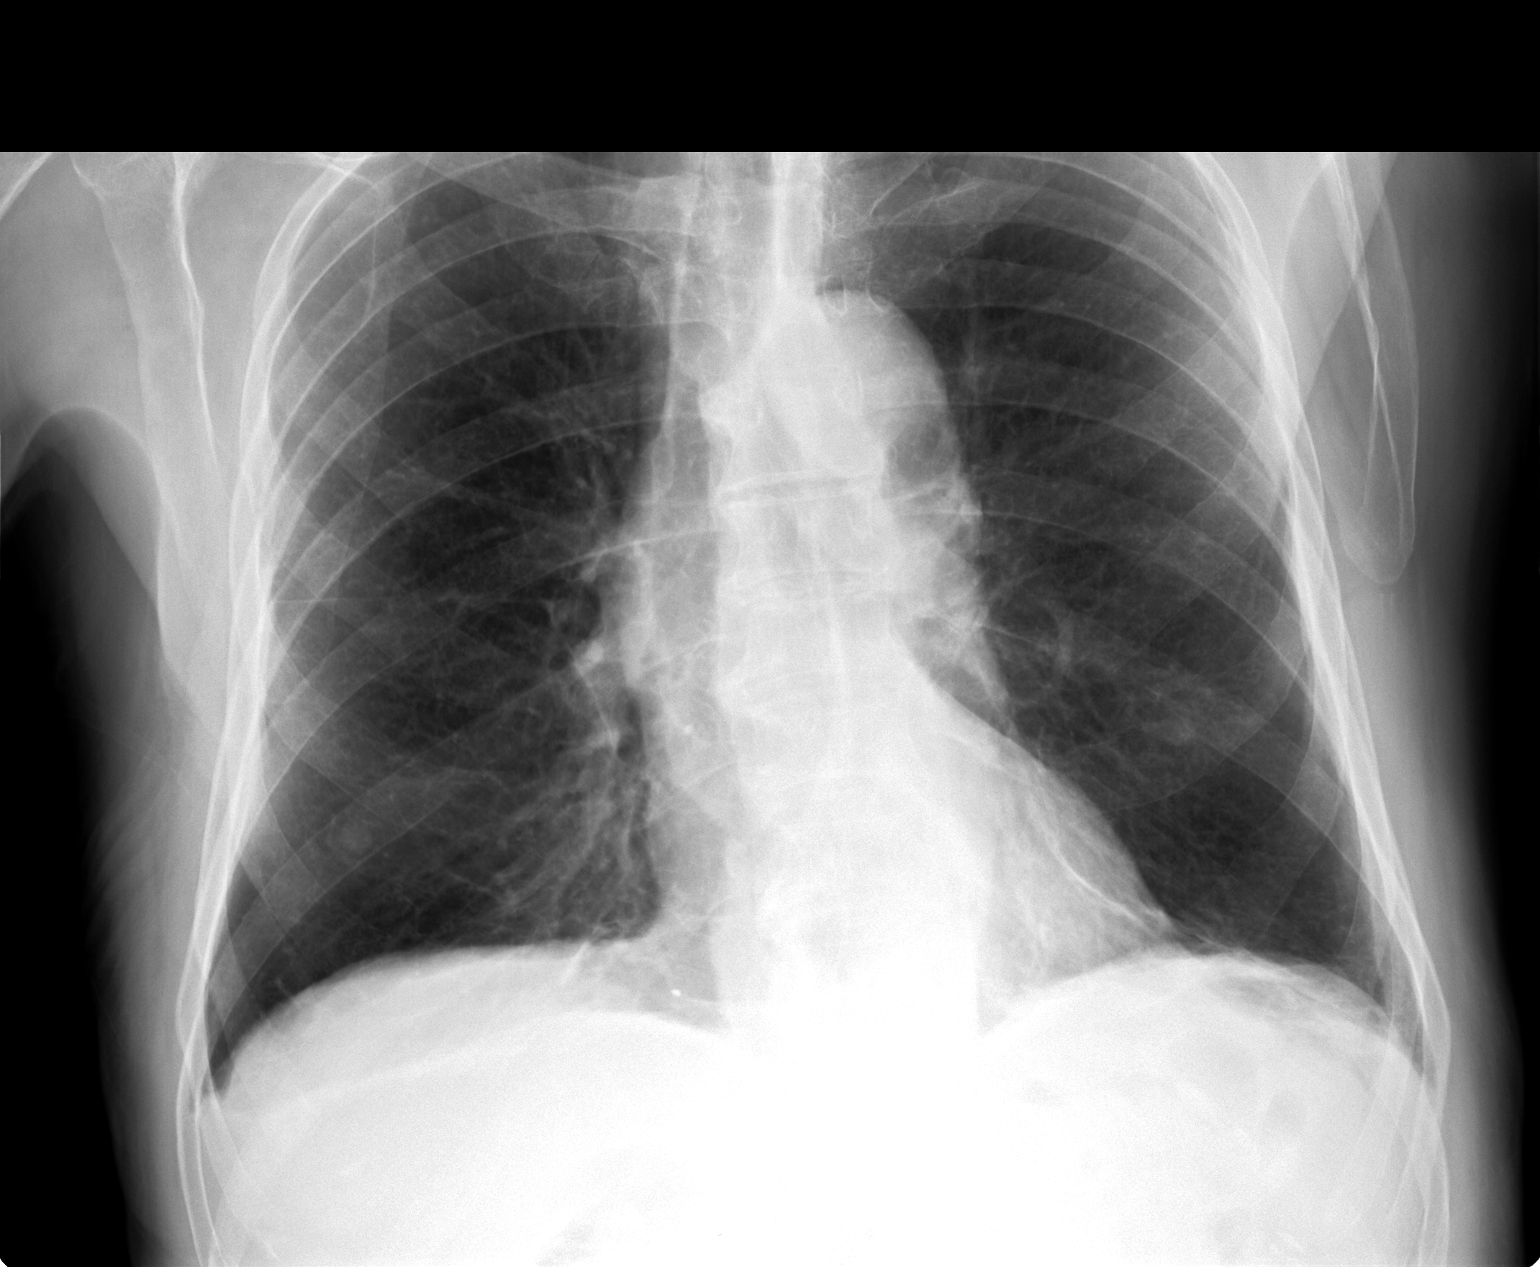

[view not recorded (2 of 2)]
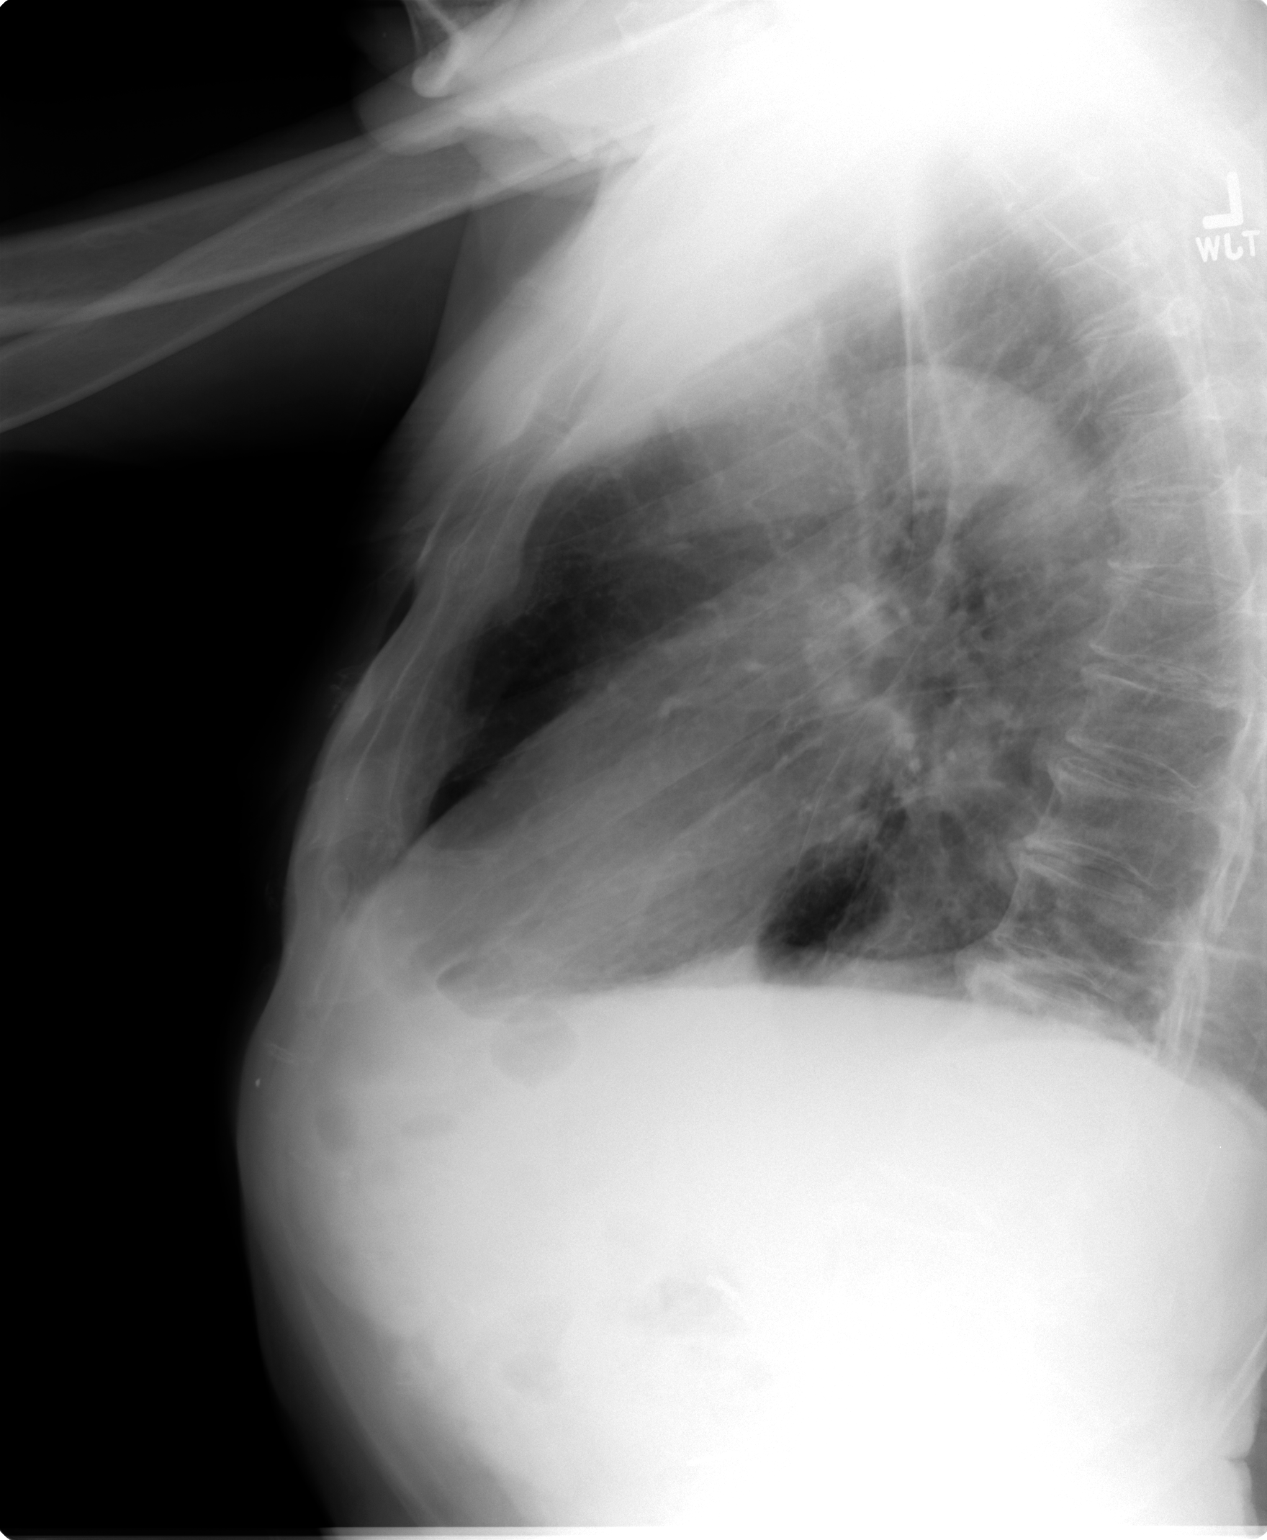

[2 of 2 positions shown; findings below may reference images not displayed]

FINDINGS: COPD with pulmonary hyperinflation. Resolving left lower lobe
pneumonia. Mild residual scar or atelectasis in the left lung base.
No significant effusion. Right lung is clear. Bilateral nipple
shadows.
IMPRESSION: COPD with clearing of left lower lobe pneumonia.

## 2017-10-10 NOTE — Telephone Encounter (Signed)
Copied from [place] [phone]. Topic: Quick Communication - See Telephone Encounter >> Oct 10, 2017  2:50 PM Rutherford Nail, NT wrote: CRM for notification. See Telephone encounter for: 10/10/17. Patient's wife calling and states that the patient is bed bound and would like [name] or his nurse to call her back to discuss home health nursing. Please advise. CB#: [phone]

## 2017-10-10 NOTE — Telephone Encounter (Signed)
Ms. [name] (wife) says that the patient is experiencing increased deterioration in his mental status.  They have a nurse that comes out to check on him once a month but that nurse suggested that they get a referral, maybe from [facility], that he could maybe get visits more often.  Wife says they need Hydro and help with bathing.  Wife currently has a Actuary through [facility] in [place] that her [place] insurance covers.  Patient is located in [place].

## 2017-10-11 NOTE — Telephone Encounter (Signed)
I put in the referral.  Thanks.  

## 2017-10-11 NOTE — Telephone Encounter (Signed)
Patient's wife notified that referral has been placed.  Advised Mrs. Esch that one of the referral coordinators will be in touch to help this this set up.

## 2017-10-12 NOTE — Telephone Encounter (Signed)
Called Mrs Delange and told her we contacted [facility] for her husbands referral. They will call them directly if they can accept them.

## 2017-10-14 DIAGNOSIS — F0280 Dementia in other diseases classified elsewhere without behavioral disturbance: Secondary | ICD-10-CM | POA: Diagnosis not present

## 2017-10-14 DIAGNOSIS — Z7902 Long term (current) use of antithrombotics/antiplatelets: Secondary | ICD-10-CM | POA: Diagnosis not present

## 2017-10-14 DIAGNOSIS — G3183 Dementia with Lewy bodies: Secondary | ICD-10-CM | POA: Diagnosis not present

## 2017-10-14 DIAGNOSIS — Z8701 Personal history of pneumonia (recurrent): Secondary | ICD-10-CM | POA: Diagnosis not present

## 2017-10-14 DIAGNOSIS — Z87440 Personal history of urinary (tract) infections: Secondary | ICD-10-CM | POA: Diagnosis not present

## 2017-10-14 DIAGNOSIS — Z9181 History of falling: Secondary | ICD-10-CM | POA: Diagnosis not present

## 2017-10-16 ENCOUNTER — Telehealth: Payer: Self-pay | Admitting: Family Medicine

## 2017-10-16 NOTE — Telephone Encounter (Signed)
Order given to the office.

## 2017-10-16 NOTE — Telephone Encounter (Signed)
Copied from [place] [id number]. Topic: Quick Communication - See Telephone Encounter >> Oct 16, 2017 10:24 AM Synthia Innocent wrote: CRM for notification. See Telephone encounter for: 10/16/17. Requesting orders for [place] and Rn for 3 weeks, order for PT to eval and treat, hoyer lift and MSW for community resources. Possible long term palliative care.

## 2017-10-16 NOTE — Telephone Encounter (Signed)
Please give the order.  Thanks.   

## 2017-10-17 ENCOUNTER — Ambulatory Visit: Payer: Self-pay | Admitting: *Deleted

## 2017-10-17 MED ORDER — AMOXICILLIN-POT CLAVULANATE 875-125 MG PO TABS: 1.0000 | ORAL_TABLET | Freq: Two times a day (BID) | ORAL | 0 refills | Status: DC

## 2017-10-17 NOTE — Telephone Encounter (Signed)
Please call wife.  Given his situation, I am okay with starting abx as is.  I sent the rx for augmentin.  I am glad to see him if/when needed.  If she prefers to bring him in now/in a few days, then we should add him on the schedule.  O/w he can start the abx and they can update me as needed.  I trust her judgement of the situation at home.  Thanks.

## 2017-10-17 NOTE — Telephone Encounter (Signed)
Duly noted.  Please have her update [place] as needed and I'll be thinking about all of them.  Thanks.

## 2017-10-17 NOTE — Telephone Encounter (Signed)
Vincent Gros, RN with [facility] is calling with an update on Mr. Omary.  This is the first visit and she notes that he is bed bound, has a productive cough with green mucous. He is also wheezing which is normal for him but seems to be a little bit more today. His O2 sat is 97 on RA, respirations 22, b/p 132/84, heart rate 60, temp 97.7.  He is having periods of apnea (baseline), shallow breathing. He is alert. Sleepy at times. Finished his antibiotics on June 14 th. No swelling in extremities. No blood in sputum or urine. He is bed bound, so no appointment is scheduled at this time (per request of wife).  She will get in there if pcp wants to see him.

## 2017-10-17 NOTE — Telephone Encounter (Signed)
Wife advised and says she appreciates the help.  The nurse this morning stated she could hear some sounds that is probably pneumonia.  Wife says she is virtually unable to get him here for a visit right now because her son that helps her transport him has a bad shoulder and is looking at rotator cuff repair and she herself when for a breast biopsy this morning and is awaiting that report.

## 2017-10-17 NOTE — Addendum Note (Signed)
Addended by: Tonia Ghent on: 10/17/2017 02:30 PM   Modules accepted: Orders

## 2017-10-18 ENCOUNTER — Telehealth: Payer: Self-pay | Admitting: Family Medicine

## 2017-10-18 ENCOUNTER — Other Ambulatory Visit: Payer: Self-pay

## 2017-10-18 DIAGNOSIS — F0280 Dementia in other diseases classified elsewhere without behavioral disturbance: Secondary | ICD-10-CM | POA: Diagnosis not present

## 2017-10-18 DIAGNOSIS — I63511 Cerebral infarction due to unspecified occlusion or stenosis of right middle cerebral artery: Secondary | ICD-10-CM

## 2017-10-18 DIAGNOSIS — Z7902 Long term (current) use of antithrombotics/antiplatelets: Secondary | ICD-10-CM | POA: Diagnosis not present

## 2017-10-18 DIAGNOSIS — Z87440 Personal history of urinary (tract) infections: Secondary | ICD-10-CM | POA: Diagnosis not present

## 2017-10-18 DIAGNOSIS — Z8701 Personal history of pneumonia (recurrent): Secondary | ICD-10-CM | POA: Diagnosis not present

## 2017-10-18 DIAGNOSIS — G3183 Dementia with Lewy bodies: Secondary | ICD-10-CM | POA: Diagnosis not present

## 2017-10-18 DIAGNOSIS — Z9181 History of falling: Secondary | ICD-10-CM | POA: Diagnosis not present

## 2017-10-18 NOTE — Telephone Encounter (Signed)
Verbal given. Also, dme printed for mechanical lift so patients wife can help him transfer from bed to chair, etc.

## 2017-10-18 NOTE — Telephone Encounter (Signed)
Please give the order.  Thanks.   

## 2017-10-18 NOTE — Telephone Encounter (Signed)
OK to give verbals for PT? 

## 2017-10-18 NOTE — Telephone Encounter (Signed)
Copied from [place] [phone]. Topic: Quick Communication - See Telephone Encounter >> Oct 18, 2017  4:11 PM Burchel, Abbi R wrote: CRM for notification. See Telephone encounter for: 10/18/17.  Kristin--[facility]  Requesting v/o for PT 1x3wk   Also, please  fax mechanical list to [phone]

## 2017-10-20 NOTE — Telephone Encounter (Signed)
Thanks

## 2017-10-21 DIAGNOSIS — I10 Essential (primary) hypertension: Secondary | ICD-10-CM | POA: Diagnosis not present

## 2017-10-21 DIAGNOSIS — E890 Postprocedural hypothyroidism: Secondary | ICD-10-CM | POA: Diagnosis not present

## 2017-10-21 DIAGNOSIS — K219 Gastro-esophageal reflux disease without esophagitis: Secondary | ICD-10-CM | POA: Diagnosis not present

## 2017-10-21 DIAGNOSIS — J188 Other pneumonia, unspecified organism: Secondary | ICD-10-CM | POA: Diagnosis not present

## 2017-10-21 DIAGNOSIS — Z9181 History of falling: Secondary | ICD-10-CM | POA: Diagnosis not present

## 2017-10-21 DIAGNOSIS — J9691 Respiratory failure, unspecified with hypoxia: Secondary | ICD-10-CM | POA: Diagnosis not present

## 2017-10-21 DIAGNOSIS — Z4789 Encounter for other orthopedic aftercare: Secondary | ICD-10-CM | POA: Diagnosis not present

## 2017-10-21 DIAGNOSIS — K581 Irritable bowel syndrome with constipation: Secondary | ICD-10-CM | POA: Diagnosis not present

## 2017-10-21 DIAGNOSIS — S72145D Nondisplaced intertrochanteric fracture of left femur, subsequent encounter for closed fracture with routine healing: Secondary | ICD-10-CM | POA: Diagnosis not present

## 2017-10-21 DIAGNOSIS — R269 Unspecified abnormalities of gait and mobility: Secondary | ICD-10-CM | POA: Diagnosis not present

## 2017-10-21 DIAGNOSIS — D62 Acute posthemorrhagic anemia: Secondary | ICD-10-CM | POA: Diagnosis not present

## 2017-10-21 DIAGNOSIS — F0390 Unspecified dementia without behavioral disturbance: Secondary | ICD-10-CM | POA: Diagnosis not present

## 2017-10-24 ENCOUNTER — Encounter: Payer: Self-pay | Admitting: *Deleted

## 2017-10-24 ENCOUNTER — Telehealth: Payer: Self-pay | Admitting: Family Medicine

## 2017-10-24 DIAGNOSIS — Z9181 History of falling: Secondary | ICD-10-CM | POA: Diagnosis not present

## 2017-10-24 DIAGNOSIS — Z87440 Personal history of urinary (tract) infections: Secondary | ICD-10-CM | POA: Diagnosis not present

## 2017-10-24 DIAGNOSIS — F0280 Dementia in other diseases classified elsewhere without behavioral disturbance: Secondary | ICD-10-CM | POA: Diagnosis not present

## 2017-10-24 DIAGNOSIS — Z7902 Long term (current) use of antithrombotics/antiplatelets: Secondary | ICD-10-CM | POA: Diagnosis not present

## 2017-10-24 DIAGNOSIS — Z8701 Personal history of pneumonia (recurrent): Secondary | ICD-10-CM | POA: Diagnosis not present

## 2017-10-24 DIAGNOSIS — G3183 Dementia with Lewy bodies: Secondary | ICD-10-CM | POA: Diagnosis not present

## 2017-10-24 NOTE — Telephone Encounter (Signed)
Please give the order.  Thanks.   

## 2017-10-24 NOTE — Telephone Encounter (Signed)
Copied from [place] [phone]. Topic: General - Other >> Oct 24, 2017  9:09 AM Judyann Munson wrote: Reason for CRM:   Kristin--[facility] contact number: [phone]  Requesting and  hoyer lift  Please fax to [phone]

## 2017-10-24 NOTE — Telephone Encounter (Signed)
Order written, signed and faxed.  

## 2017-10-30 DIAGNOSIS — Z7902 Long term (current) use of antithrombotics/antiplatelets: Secondary | ICD-10-CM | POA: Diagnosis not present

## 2017-10-30 DIAGNOSIS — Z8701 Personal history of pneumonia (recurrent): Secondary | ICD-10-CM | POA: Diagnosis not present

## 2017-10-30 DIAGNOSIS — Z9181 History of falling: Secondary | ICD-10-CM | POA: Diagnosis not present

## 2017-10-30 DIAGNOSIS — Z87440 Personal history of urinary (tract) infections: Secondary | ICD-10-CM | POA: Diagnosis not present

## 2017-10-30 DIAGNOSIS — F0280 Dementia in other diseases classified elsewhere without behavioral disturbance: Secondary | ICD-10-CM | POA: Diagnosis not present

## 2017-10-30 DIAGNOSIS — G3183 Dementia with Lewy bodies: Secondary | ICD-10-CM | POA: Diagnosis not present

## 2017-11-06 ENCOUNTER — Telehealth: Payer: Self-pay | Admitting: Family Medicine

## 2017-11-06 NOTE — Telephone Encounter (Signed)
Copied from [place] [id number]. Topic: General - Other >> Nov 06, 2017  9:46 AM Yvette Rack wrote: Reason for CRM: Kend[facility] with [facility] requests verbal orders for extension of skilled nursing 2 times a week for 3 weeks and 1 time a week for 2 weeks. Cb# [phone]

## 2017-11-06 NOTE — Telephone Encounter (Signed)
Agree with this. Thanks.  

## 2017-11-07 ENCOUNTER — Other Ambulatory Visit: Payer: Self-pay | Admitting: Family Medicine

## 2017-11-07 DIAGNOSIS — F0280 Dementia in other diseases classified elsewhere without behavioral disturbance: Secondary | ICD-10-CM | POA: Diagnosis not present

## 2017-11-07 DIAGNOSIS — G3183 Dementia with Lewy bodies: Secondary | ICD-10-CM | POA: Diagnosis not present

## 2017-11-07 DIAGNOSIS — Z87440 Personal history of urinary (tract) infections: Secondary | ICD-10-CM | POA: Diagnosis not present

## 2017-11-07 DIAGNOSIS — Z7902 Long term (current) use of antithrombotics/antiplatelets: Secondary | ICD-10-CM | POA: Diagnosis not present

## 2017-11-07 DIAGNOSIS — Z8701 Personal history of pneumonia (recurrent): Secondary | ICD-10-CM | POA: Diagnosis not present

## 2017-11-07 DIAGNOSIS — Z9181 History of falling: Secondary | ICD-10-CM | POA: Diagnosis not present

## 2017-11-07 NOTE — Telephone Encounter (Signed)
Spoke with Dorthea Cove of [facility] informing her Dr. Darnell Level gives verbal order to extend skilled nursing.  Verbalizes understanding.

## 2017-11-13 DIAGNOSIS — Z87440 Personal history of urinary (tract) infections: Secondary | ICD-10-CM | POA: Diagnosis not present

## 2017-11-13 DIAGNOSIS — Z8701 Personal history of pneumonia (recurrent): Secondary | ICD-10-CM | POA: Diagnosis not present

## 2017-11-13 DIAGNOSIS — Z9181 History of falling: Secondary | ICD-10-CM | POA: Diagnosis not present

## 2017-11-13 DIAGNOSIS — Z7902 Long term (current) use of antithrombotics/antiplatelets: Secondary | ICD-10-CM | POA: Diagnosis not present

## 2017-11-13 DIAGNOSIS — F0280 Dementia in other diseases classified elsewhere without behavioral disturbance: Secondary | ICD-10-CM | POA: Diagnosis not present

## 2017-11-13 DIAGNOSIS — G3183 Dementia with Lewy bodies: Secondary | ICD-10-CM | POA: Diagnosis not present

## 2017-11-15 IMAGING — DX DG FEMUR 2+V*L*
5 series · 5 of 5 positions shown · non-contrast
Comparison: Left knee and hip radiographs - earlier same day, left
hip radiographs - 12/25/2014;

CLINICAL DATA: Post fall from bed this morning now with left leg
pain. Unable to move left leg.

EXAM:
LEFT FEMUR 2 VIEWS

[t femur distal lat left]
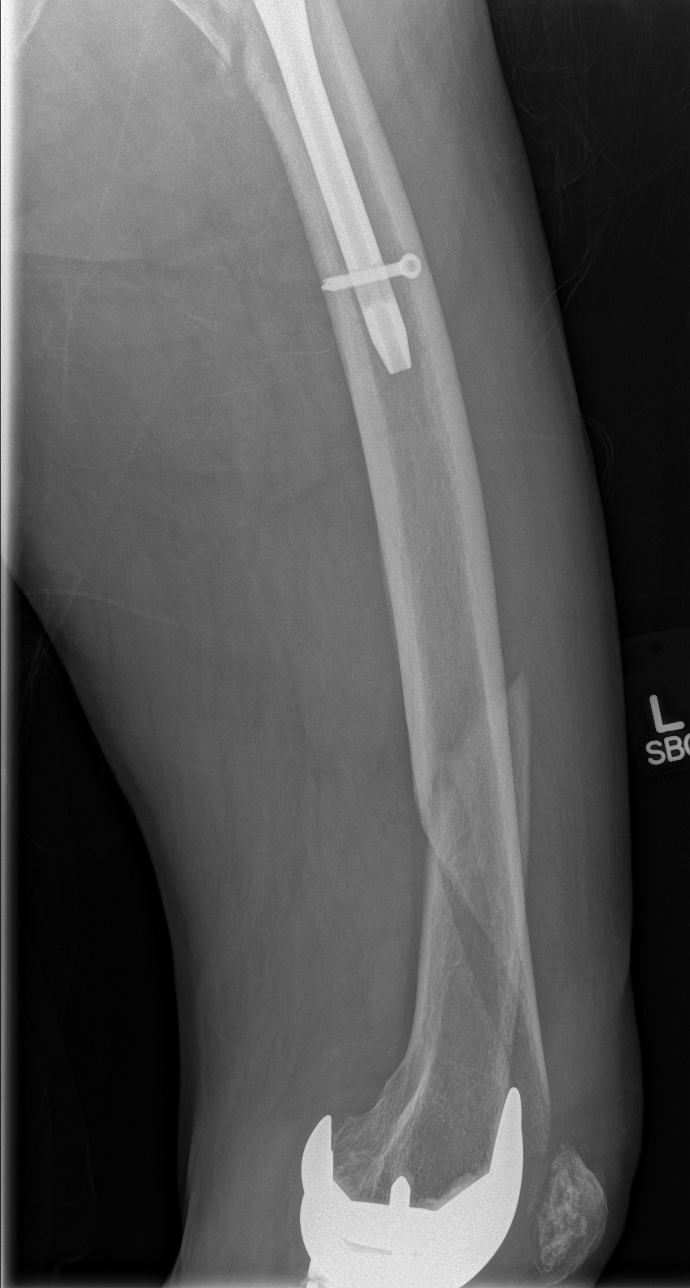

[t femur proximal lat left]
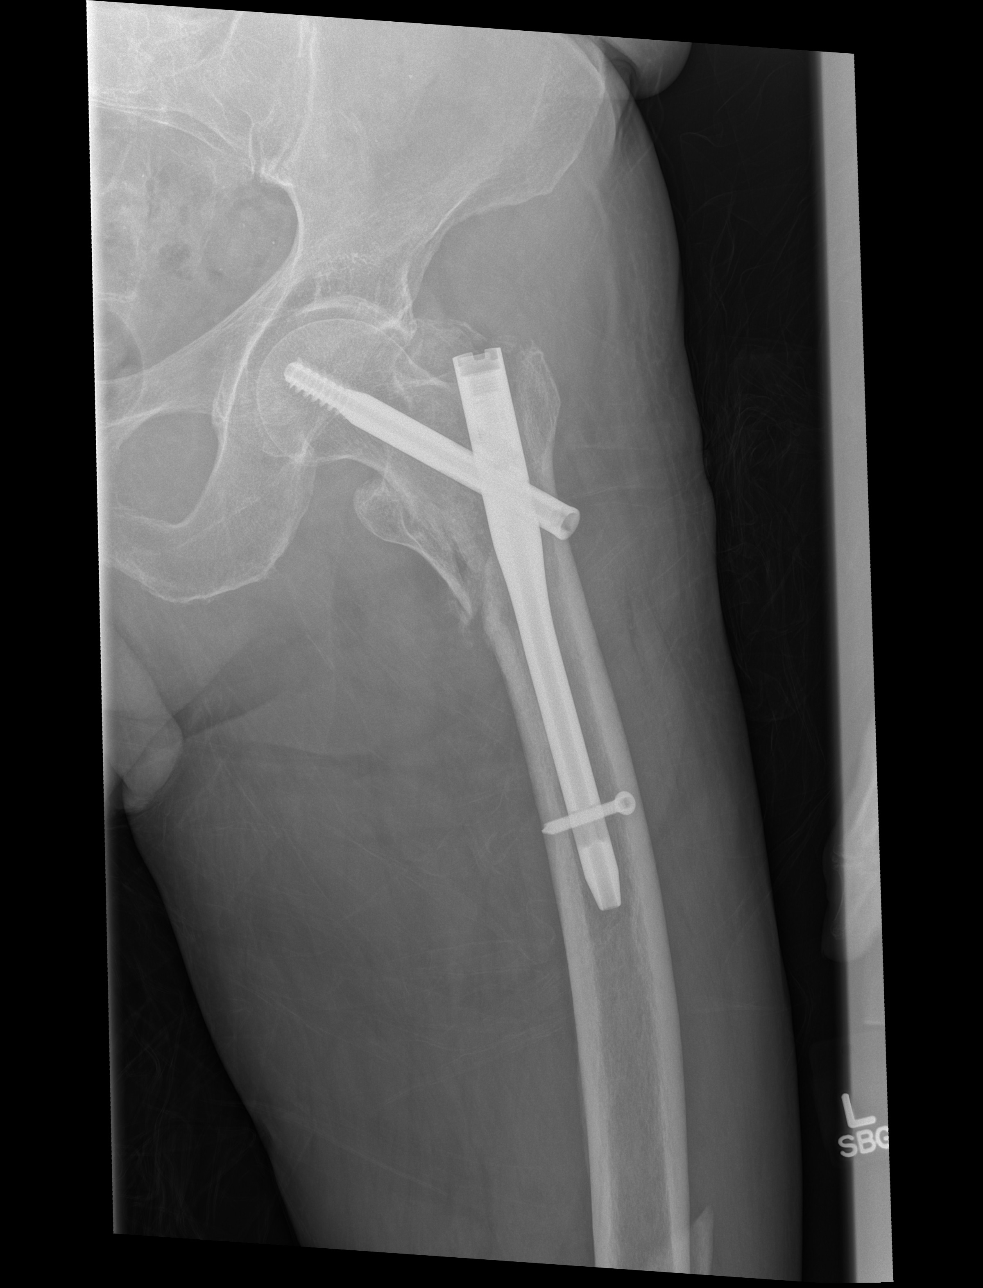

[x femur distal ap left]
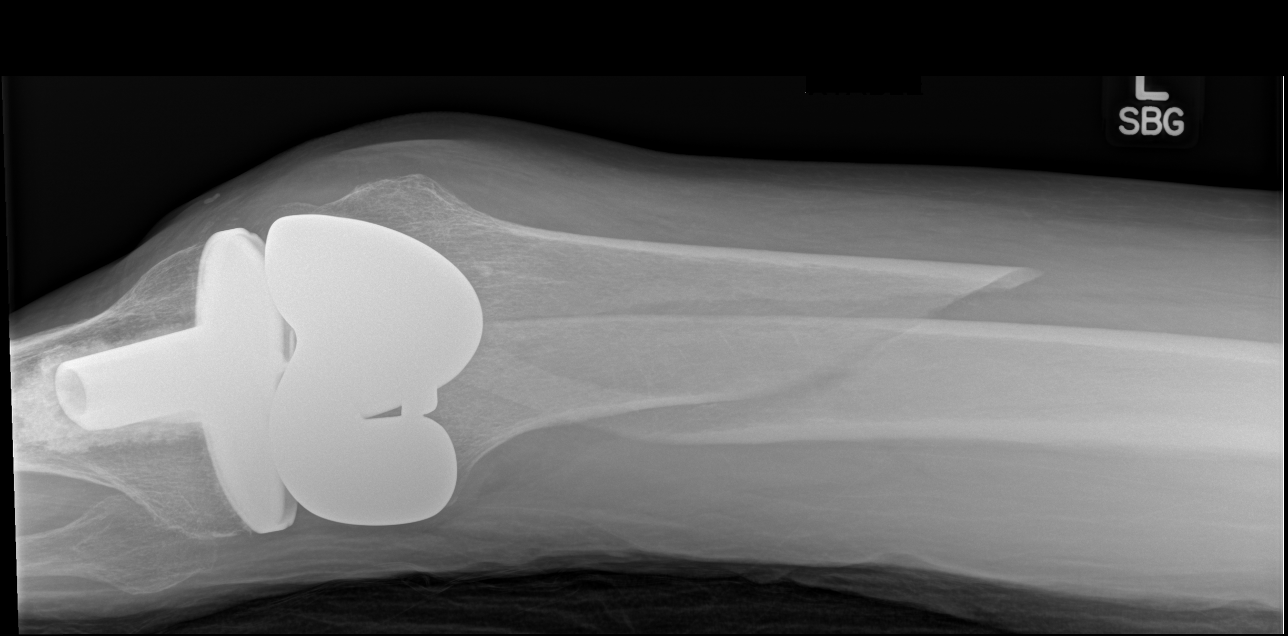

[x femur proximal ap left (1 of 2)]
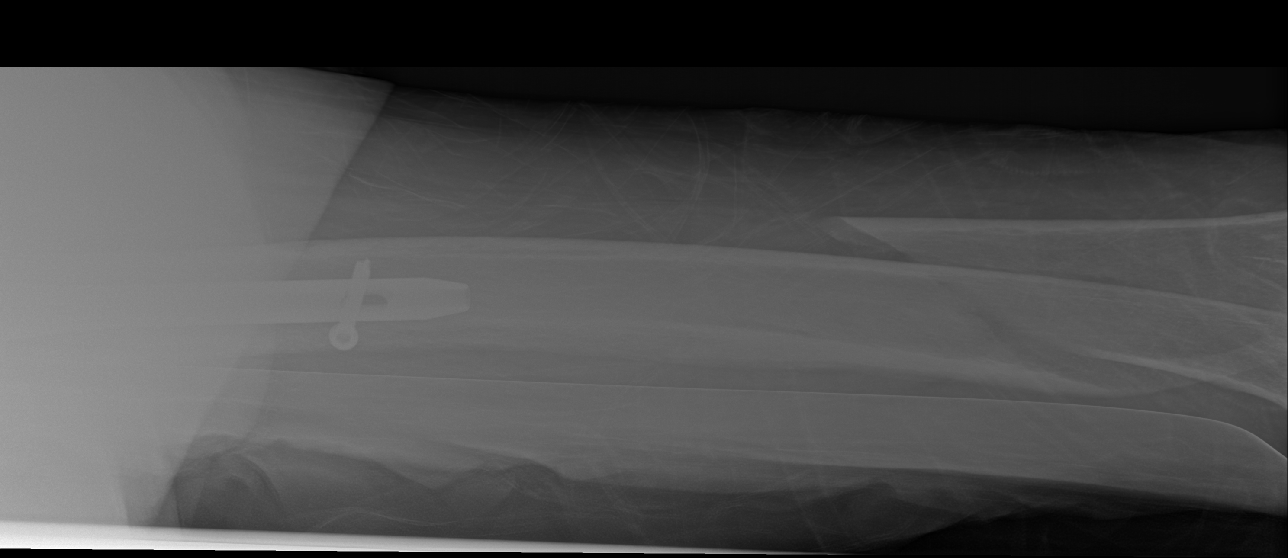

[x femur proximal ap left (2 of 2)]
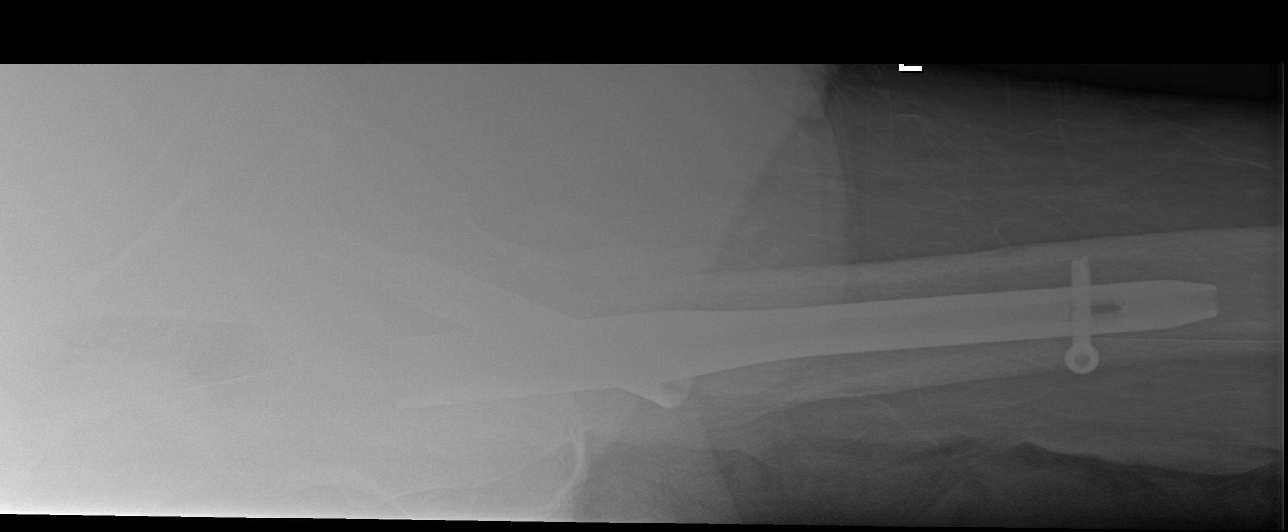

[5 of 5 positions shown; findings below may reference images not displayed]

left knee radiographs - 11/29/2014;
intraoperative radiographs of the left hip - 12/26/2014
FINDINGS: Post intra medullary rod and dynamic screw fixation of previously
noted comminuted left intertrochanteric femur fracture. No definite
evidence of hardware failure or loosening. Persistently displaced
intratrochanteric fracture fragments are grossly unchanged compared
to intraoperative radiograph performed 12/26/2014.

There is an acute minimally displaced obliquely oriented fracture
involving the distal femoral metadiaphysis with minimal
foreshortening but without definite extension to the cranial aspect
of the femoral component of the knee prosthesis.
IMPRESSION: 1. Acute obliquely oriented minimally displaced fracture involving
the distal femoral meta diaphysis with minimal foreshortening but
without definitive extension to involve the femoral component of the
knee prosthesis. Correlation with dedicated knee radiographs
performed earlier same day is recommended.
2. Stable sequela of intra medullary rod and dynamic screw fixation
of previously noted comminuted left intertrochanteric femur fracture
without definite evidence of hardware failure or loosening or
definitive acute superimposed injury / fracture.

## 2017-11-21 ENCOUNTER — Telehealth: Payer: Self-pay | Admitting: Family Medicine

## 2017-11-21 DIAGNOSIS — I10 Essential (primary) hypertension: Secondary | ICD-10-CM | POA: Diagnosis not present

## 2017-11-21 DIAGNOSIS — F0390 Unspecified dementia without behavioral disturbance: Secondary | ICD-10-CM | POA: Diagnosis not present

## 2017-11-21 DIAGNOSIS — Z87440 Personal history of urinary (tract) infections: Secondary | ICD-10-CM | POA: Diagnosis not present

## 2017-11-21 DIAGNOSIS — Z8701 Personal history of pneumonia (recurrent): Secondary | ICD-10-CM | POA: Diagnosis not present

## 2017-11-21 DIAGNOSIS — J188 Other pneumonia, unspecified organism: Secondary | ICD-10-CM | POA: Diagnosis not present

## 2017-11-21 DIAGNOSIS — Z9181 History of falling: Secondary | ICD-10-CM | POA: Diagnosis not present

## 2017-11-21 DIAGNOSIS — F0280 Dementia in other diseases classified elsewhere without behavioral disturbance: Secondary | ICD-10-CM | POA: Diagnosis not present

## 2017-11-21 DIAGNOSIS — K219 Gastro-esophageal reflux disease without esophagitis: Secondary | ICD-10-CM | POA: Diagnosis not present

## 2017-11-21 DIAGNOSIS — E890 Postprocedural hypothyroidism: Secondary | ICD-10-CM | POA: Diagnosis not present

## 2017-11-21 DIAGNOSIS — D62 Acute posthemorrhagic anemia: Secondary | ICD-10-CM | POA: Diagnosis not present

## 2017-11-21 DIAGNOSIS — S72145D Nondisplaced intertrochanteric fracture of left femur, subsequent encounter for closed fracture with routine healing: Secondary | ICD-10-CM | POA: Diagnosis not present

## 2017-11-21 DIAGNOSIS — Z4789 Encounter for other orthopedic aftercare: Secondary | ICD-10-CM | POA: Diagnosis not present

## 2017-11-21 DIAGNOSIS — R269 Unspecified abnormalities of gait and mobility: Secondary | ICD-10-CM | POA: Diagnosis not present

## 2017-11-21 DIAGNOSIS — J9691 Respiratory failure, unspecified with hypoxia: Secondary | ICD-10-CM | POA: Diagnosis not present

## 2017-11-21 DIAGNOSIS — Z7902 Long term (current) use of antithrombotics/antiplatelets: Secondary | ICD-10-CM | POA: Diagnosis not present

## 2017-11-21 DIAGNOSIS — G3183 Dementia with Lewy bodies: Secondary | ICD-10-CM | POA: Diagnosis not present

## 2017-11-21 DIAGNOSIS — K581 Irritable bowel syndrome with constipation: Secondary | ICD-10-CM | POA: Diagnosis not present

## 2017-11-21 MED ORDER — AMOXICILLIN-POT CLAVULANATE 875-125 MG PO TABS: 1.0000 | ORAL_TABLET | Freq: Two times a day (BID) | ORAL | 0 refills | Status: DC

## 2017-11-21 NOTE — Telephone Encounter (Signed)
Kendrice, RN with [facility] advised.

## 2017-11-21 NOTE — Telephone Encounter (Signed)
I would start augmentin.  If the has urinary changes, with more confusion, with resp changes, then would proceed with the abx.  Have them update me as needed.  Based on prior hx and his current transport status, likely reasonable to treat w/o OV.  I would defer cath u/a at this point.  rx sent.  Thanks.

## 2017-11-21 NOTE — Telephone Encounter (Signed)
Copied from [place] [phone]. Topic: Quick Communication - See Telephone Encounter >> Nov 21, 2017 11:48 AM Ahmed Prima L wrote: CRM for notification. See Telephone encounter for: 11/21/17.  Kendrice, RN with [facility] wanted [name] to know that his oxygen is 88 on room air, no difficulty breathing. Right lower level has some faint wheezing. She did say that he is resting comfortably but has a odor to his urine with some confusion.   Call back # [phone]

## 2017-11-28 ENCOUNTER — Encounter: Payer: Self-pay | Admitting: *Deleted

## 2017-11-28 DIAGNOSIS — G3183 Dementia with Lewy bodies: Secondary | ICD-10-CM | POA: Diagnosis not present

## 2017-11-28 DIAGNOSIS — Z7902 Long term (current) use of antithrombotics/antiplatelets: Secondary | ICD-10-CM | POA: Diagnosis not present

## 2017-11-28 DIAGNOSIS — Z8701 Personal history of pneumonia (recurrent): Secondary | ICD-10-CM | POA: Diagnosis not present

## 2017-11-28 DIAGNOSIS — Z9181 History of falling: Secondary | ICD-10-CM | POA: Diagnosis not present

## 2017-11-28 DIAGNOSIS — Z87440 Personal history of urinary (tract) infections: Secondary | ICD-10-CM | POA: Diagnosis not present

## 2017-11-28 DIAGNOSIS — F0280 Dementia in other diseases classified elsewhere without behavioral disturbance: Secondary | ICD-10-CM | POA: Diagnosis not present

## 2017-11-28 MED ORDER — ALBUTEROL SULFATE (2.5 MG/3ML) 0.083% IN NEBU: 2.5000 mg | INHALATION_SOLUTION | Freq: Four times a day (QID) | RESPIRATORY_TRACT | 1 refills | Status: AC | PRN

## 2017-11-28 NOTE — Telephone Encounter (Signed)
Kendrice, RN with [facility] calling to give update on pt: Pt has been on antibiotics for UTI since 08/22. Oxygen level 95% Still hears wheezing in chest and bilateral lung field. Wants to know if doctor wants to continue on same course of care.

## 2017-11-28 NOTE — Telephone Encounter (Signed)
Kendrice RN says she thinks he can do a nebulizer treatment but he does not have a nebulizer machine.  Kendrice says this can be ordered through [place] if you like.  Kendrice, RN also says she thinks the patient needs to have oxygen on hand if needed but wife says this has never really been discussed previously.

## 2017-11-28 NOTE — Telephone Encounter (Signed)
Do they think patient could tolerate using an albuterol neb?  If so, then proceed. I sent rx.  Update me as needed.  Would continue abx for now.  Thanks.

## 2017-11-28 NOTE — Telephone Encounter (Signed)
Please give the order for the neb machine- if I need to place a hard copy order I can.  If sats are >90, then I would defer O2 for now as it Cham be more agitating to the patient.   Thanks.

## 2017-11-28 NOTE — Addendum Note (Signed)
Addended by: Tonia Ghent on: 11/28/2017 02:06 PM   Modules accepted: Orders

## 2017-11-28 NOTE — Telephone Encounter (Signed)
Order faxed to [facility] for nebulizer machine.  Could not reach [name] with [facility].

## 2017-11-29 NOTE — Telephone Encounter (Signed)
Kendrice with [facility] notified as instructed by telephone and verbalized understanding. Kendrice stated that she will follow-up with [facility] and make sure that they got the order.

## 2017-11-30 DIAGNOSIS — K581 Irritable bowel syndrome with constipation: Secondary | ICD-10-CM | POA: Diagnosis not present

## 2017-11-30 DIAGNOSIS — K219 Gastro-esophageal reflux disease without esophagitis: Secondary | ICD-10-CM | POA: Diagnosis not present

## 2017-11-30 DIAGNOSIS — Z4789 Encounter for other orthopedic aftercare: Secondary | ICD-10-CM | POA: Diagnosis not present

## 2017-11-30 DIAGNOSIS — Z9181 History of falling: Secondary | ICD-10-CM | POA: Diagnosis not present

## 2017-11-30 DIAGNOSIS — E890 Postprocedural hypothyroidism: Secondary | ICD-10-CM | POA: Diagnosis not present

## 2017-11-30 DIAGNOSIS — D62 Acute posthemorrhagic anemia: Secondary | ICD-10-CM | POA: Diagnosis not present

## 2017-11-30 DIAGNOSIS — F0390 Unspecified dementia without behavioral disturbance: Secondary | ICD-10-CM | POA: Diagnosis not present

## 2017-11-30 DIAGNOSIS — J188 Other pneumonia, unspecified organism: Secondary | ICD-10-CM | POA: Diagnosis not present

## 2017-11-30 DIAGNOSIS — I10 Essential (primary) hypertension: Secondary | ICD-10-CM | POA: Diagnosis not present

## 2017-11-30 DIAGNOSIS — S72145D Nondisplaced intertrochanteric fracture of left femur, subsequent encounter for closed fracture with routine healing: Secondary | ICD-10-CM | POA: Diagnosis not present

## 2017-11-30 DIAGNOSIS — J9691 Respiratory failure, unspecified with hypoxia: Secondary | ICD-10-CM | POA: Diagnosis not present

## 2017-11-30 DIAGNOSIS — R269 Unspecified abnormalities of gait and mobility: Secondary | ICD-10-CM | POA: Diagnosis not present

## 2017-12-05 DIAGNOSIS — Z7902 Long term (current) use of antithrombotics/antiplatelets: Secondary | ICD-10-CM | POA: Diagnosis not present

## 2017-12-05 DIAGNOSIS — G3183 Dementia with Lewy bodies: Secondary | ICD-10-CM | POA: Diagnosis not present

## 2017-12-05 DIAGNOSIS — Z8701 Personal history of pneumonia (recurrent): Secondary | ICD-10-CM | POA: Diagnosis not present

## 2017-12-05 DIAGNOSIS — Z87440 Personal history of urinary (tract) infections: Secondary | ICD-10-CM | POA: Diagnosis not present

## 2017-12-05 DIAGNOSIS — Z9181 History of falling: Secondary | ICD-10-CM | POA: Diagnosis not present

## 2017-12-05 DIAGNOSIS — F0280 Dementia in other diseases classified elsewhere without behavioral disturbance: Secondary | ICD-10-CM | POA: Diagnosis not present

## 2017-12-08 ENCOUNTER — Telehealth: Payer: Self-pay | Admitting: Family Medicine

## 2017-12-08 NOTE — Telephone Encounter (Signed)
Sounds like he will need OV. Will defer to PCP.

## 2017-12-08 NOTE — Telephone Encounter (Signed)
Copied from [place] [phone]. Topic: Quick Communication - See Telephone Encounter >> Dec 08, 2017  2:07 PM Vernona Rieger wrote: CRM for notification. See Telephone encounter for: 12/08/17.  Kendrice, RN with [facility] called and said that his insurance would not cover his oxygen in the home unless he had a visit within 30 days. He has the nebulizer and the treatments, they are helping him. His wife said last night his oxygen was 87%. He is not having any type of shortness of breath, he is doing fine. His readings are low per his wife. Right now, his oxygen is at 95% with Kendrice there. Her call back @ [phone] if you need to call her back for anything.

## 2017-12-10 NOTE — Telephone Encounter (Signed)
Can an OV get set up?  Can it physically be done?   If not, will a home visit at some point this week count as long as it is billed as a visit? Let me know.  Thanks.

## 2017-12-11 NOTE — Telephone Encounter (Signed)
Left detailed message on voicemail of Kendrice, RN at [facility].

## 2017-12-12 NOTE — Telephone Encounter (Signed)
Spoke with Kendrice RN at [place] at [facility] and she states she will speak with the insurance specialist at [place] to see if this will suffice and let us know.

## 2017-12-13 NOTE — Telephone Encounter (Signed)
[facility] has stated it is ok for [name] to have a home visit from Dr. Damita Dunnings as long as it is billed as a face-to-face office visit so the patient is able to get supplemental oxygen at home. [name] is not able to get into the office. Please advise.

## 2017-12-14 ENCOUNTER — Ambulatory Visit (INDEPENDENT_AMBULATORY_CARE_PROVIDER_SITE_OTHER): Payer: PPO | Admitting: Family Medicine

## 2017-12-14 DIAGNOSIS — R05 Cough: Secondary | ICD-10-CM | POA: Diagnosis not present

## 2017-12-14 DIAGNOSIS — F0390 Unspecified dementia without behavioral disturbance: Secondary | ICD-10-CM | POA: Diagnosis not present

## 2017-12-14 DIAGNOSIS — R059 Cough, unspecified: Secondary | ICD-10-CM

## 2017-12-14 NOTE — Telephone Encounter (Signed)
Late entry.  I went to the patient's home for face-to-face visit.  I need this phone call, or at least this portion of the phone call, moved into an office visit.  Discussed with patient's wife that we would document this as an office visit.  Chief complaint dementia.  Cough.  Bedbound 84 year old male who needs full assist to get out of the bed with history of dementia still on baseline medications listed.  No adverse effect on medications.  He has been increasingly drowsy and sleeping more during the day.  When he is awake he can be intermittently alert.  He can recognize family members on good days and have conversation.  He clearly recognized me and could call me by name when I visited him at his home today.  He is not getting agitated.  He requires repositioning in an adjustable bed to try to prevent aspiration.  He is still eating fairly well.  Cough.  Family has documented O2 sats less than 90%.  Not currently on oxygen at home.  Is using nebulizer which helps with his cough.  Some sputum, occasionally.  No fevers.  PMH and SH reviewed  ROS: Per HPI unless specifically indicated in ROS section   Meds, vitals, and allergies reviewed.   Pulse 55 Pulse ox 95% on room air. Blood pressure 120/60 Respiratory rate 18 Temperature 98.2  GEN: nad, alert and responsive initially with conversation.  Toward the end of the visit he was more drowsy and appeared to nap.  An adjustable bed at home. HEENT: mucous membranes moist NECK: supple w/o LA CV: rrr.   PULM: ctab, no inc wob, no wheeze.  No focal decrease in breath sounds. ABD: soft, +bs EXT: no edema SKIN: no acute rash Loss of muscle mass noted diffusely.  No skin breakdown noted.  Assessment and plan  1.  Dementia.  Progressive.  Waxing and waning alertness.  Still eating.  Aspiration cautions discussed.  No change in medications at this point.  Family wants to keep patient at home and they appear to be able to do so at this point.   Using a lift to get patient out of the bed when he does get out of bed.  Nonambulatory.  Continue with home health care.  Fall assist.  2.  Cough.  Intermittent hypoxia.  O2 sat today 95% on room air.  Does not qualify for home oxygen based on exam today.  Continue nebulizer.  Lungs are clear.  Should he have persistent O2 sats less than 90% we can recheck the patient to see if he will qualify for home O2.  At this point continue as is.  Not on antibiotics.  No fevers.  Family will update me as needed.  Plan discussed with family.  All agree.  I thanked them for letting me come by the house. >25 minutes spent in face to face time with patient, >50% spent in counselling or coordination of care.

## 2017-12-14 NOTE — Addendum Note (Signed)
Addended by: Tonia Ghent on: 12/14/2017 11:46 PM   Modules accepted: Orders

## 2017-12-14 NOTE — Telephone Encounter (Signed)
Wife says that is fine.

## 2017-12-14 NOTE — Telephone Encounter (Signed)
Please see if they are okay with home visit tonight after I finish clinic.  I can bring O2 tank to test him and see if he qualifies.  Thanks.

## 2017-12-22 DIAGNOSIS — F0390 Unspecified dementia without behavioral disturbance: Secondary | ICD-10-CM | POA: Diagnosis not present

## 2017-12-22 DIAGNOSIS — D62 Acute posthemorrhagic anemia: Secondary | ICD-10-CM | POA: Diagnosis not present

## 2017-12-22 DIAGNOSIS — J188 Other pneumonia, unspecified organism: Secondary | ICD-10-CM | POA: Diagnosis not present

## 2017-12-22 DIAGNOSIS — S72145D Nondisplaced intertrochanteric fracture of left femur, subsequent encounter for closed fracture with routine healing: Secondary | ICD-10-CM | POA: Diagnosis not present

## 2017-12-22 DIAGNOSIS — K581 Irritable bowel syndrome with constipation: Secondary | ICD-10-CM | POA: Diagnosis not present

## 2017-12-22 DIAGNOSIS — E890 Postprocedural hypothyroidism: Secondary | ICD-10-CM | POA: Diagnosis not present

## 2017-12-22 DIAGNOSIS — J9691 Respiratory failure, unspecified with hypoxia: Secondary | ICD-10-CM | POA: Diagnosis not present

## 2017-12-22 DIAGNOSIS — R269 Unspecified abnormalities of gait and mobility: Secondary | ICD-10-CM | POA: Diagnosis not present

## 2017-12-22 DIAGNOSIS — K219 Gastro-esophageal reflux disease without esophagitis: Secondary | ICD-10-CM | POA: Diagnosis not present

## 2017-12-22 DIAGNOSIS — Z4789 Encounter for other orthopedic aftercare: Secondary | ICD-10-CM | POA: Diagnosis not present

## 2017-12-22 DIAGNOSIS — I10 Essential (primary) hypertension: Secondary | ICD-10-CM | POA: Diagnosis not present

## 2017-12-22 DIAGNOSIS — Z9181 History of falling: Secondary | ICD-10-CM | POA: Diagnosis not present

## 2017-12-24 ENCOUNTER — Encounter: Payer: Self-pay | Admitting: Family Medicine

## 2017-12-24 NOTE — Progress Notes (Signed)
Late entry.  I went to the patient's home for face-to-face visit.  I need this phone call, or at least this portion of the phone call, moved into an office visit.  Discussed with patient's wife that we would document this as an office visit.  Chief complaint dementia.  Cough.  Bedbound 84 year old male who needs full assist to get out of the bed with history of dementia still on baseline medications listed.  No adverse effect on medications.  He has been increasingly drowsy and sleeping more during the day.  When he is awake he can be intermittently alert.  He can recognize family members on good days and have conversation.  He clearly recognized me and could call me by name when I visited him at his home today.  He is not getting agitated.  He requires repositioning in an adjustable bed to try to prevent aspiration.  He is still eating fairly well.  Cough.  Family has documented O2 sats less than 90%.  Not currently on oxygen at home.  Is using nebulizer which helps with his cough.  Some sputum, occasionally.  No fevers.  PMH and SH reviewed  ROS: Per HPI unless specifically indicated in ROS section   Meds, vitals, and allergies reviewed.   Pulse 55 Pulse ox 95% on room air. Blood pressure 120/60 Respiratory rate 18 Temperature 98.2  GEN: nad, alert and responsive initially with conversation.  Toward the end of the visit he was more drowsy and appeared to nap.  In adjustable bed at home. HEENT: mucous membranes moist NECK: supple w/o LA CV: rrr.   PULM: ctab, no inc wob, no wheeze.  No focal decrease in breath sounds. ABD: soft, +bs EXT: no edema SKIN: no acute rash Loss of muscle mass noted diffusely.  No skin breakdown noted.  Assessment and plan  1.  Dementia.  Progressive.  Waxing and waning alertness.  Still eating.  Aspiration cautions discussed.  No change in medications at this point.  Family wants to keep patient at home and they appear to be able to do so at this  point.  Using a lift to get patient out of the bed when he does get out of bed.  Nonambulatory.  Continue with home health care.  Fall assist.  2.  Cough.  Intermittent hypoxia.  O2 sat today 95% on room air.  Does not qualify for home oxygen based on exam today.  Continue nebulizer.  Lungs are clear.  Should he have persistent O2 sats less than 90% we can recheck the patient to see if he will qualify for home O2.  At this point continue as is.  Not on antibiotics.  No fevers.  Family will update me as needed.  Plan discussed with family.  All agree.  I thanked them for letting me come by the house. >25 minutes spent in face to face time with patient, >50% spent in counselling or coordination of care.

## 2017-12-24 NOTE — Telephone Encounter (Signed)
OV note done.  Thanks.

## 2017-12-24 NOTE — Assessment & Plan Note (Signed)
Intermittent hypoxia.  O2 sat today 95% on room air.  Does not qualify for home oxygen based on exam today.  Continue nebulizer.  Lungs are clear.  Should he have persistent O2 sats less than 90% we can recheck the patient to see if he will qualify for home O2.  At this point continue as is.  Not on antibiotics.  No fevers.  Family will update me as needed.  Plan discussed with family.  All agree.  I thanked them for letting me come by the house. >25 minutes spent in face to face time with patient, >50% spent in counselling or coordination of care.

## 2017-12-24 NOTE — Patient Instructions (Signed)
No AVS to print with home visit.  Plan verbally detailed with family at home.

## 2017-12-24 NOTE — Assessment & Plan Note (Signed)
Progressive.  Waxing and waning alertness.  Still eating.  Aspiration cautions discussed.  No change in medications at this point.  Family wants to keep patient at home and they appear to be able to do so at this point.  Using a lift to get patient out of the bed when he does get out of bed.  Nonambulatory.  Continue with home health care.  Fall assist.

## 2017-12-28 ENCOUNTER — Telehealth: Payer: Self-pay | Admitting: Family Medicine

## 2017-12-28 NOTE — Telephone Encounter (Signed)
Please give the order.  If they need a new order in the EMR, then let me know.   Thanks.

## 2017-12-28 NOTE — Telephone Encounter (Signed)
Copied from [place] [id number]. Topic: General - Other >> Dec 28, 2017 10:55 AM Margot Ables wrote: Reason for CRM: Pt has been with [facility] but they changed EMR systems. They are needing new referral for [facility] sent for the new system for SN for disease mgmt and HH aid for assistance bathing. Please fax to [phone].

## 2017-12-29 ENCOUNTER — Encounter: Payer: Self-pay | Admitting: *Deleted

## 2017-12-29 NOTE — Telephone Encounter (Signed)
New order faxed to number provided

## 2017-12-31 DIAGNOSIS — I10 Essential (primary) hypertension: Secondary | ICD-10-CM | POA: Diagnosis not present

## 2017-12-31 DIAGNOSIS — J188 Other pneumonia, unspecified organism: Secondary | ICD-10-CM | POA: Diagnosis not present

## 2017-12-31 DIAGNOSIS — K581 Irritable bowel syndrome with constipation: Secondary | ICD-10-CM | POA: Diagnosis not present

## 2017-12-31 DIAGNOSIS — R269 Unspecified abnormalities of gait and mobility: Secondary | ICD-10-CM | POA: Diagnosis not present

## 2017-12-31 DIAGNOSIS — K219 Gastro-esophageal reflux disease without esophagitis: Secondary | ICD-10-CM | POA: Diagnosis not present

## 2017-12-31 DIAGNOSIS — D62 Acute posthemorrhagic anemia: Secondary | ICD-10-CM | POA: Diagnosis not present

## 2017-12-31 DIAGNOSIS — J9691 Respiratory failure, unspecified with hypoxia: Secondary | ICD-10-CM | POA: Diagnosis not present

## 2017-12-31 DIAGNOSIS — Z9181 History of falling: Secondary | ICD-10-CM | POA: Diagnosis not present

## 2017-12-31 DIAGNOSIS — Z4789 Encounter for other orthopedic aftercare: Secondary | ICD-10-CM | POA: Diagnosis not present

## 2017-12-31 DIAGNOSIS — F0390 Unspecified dementia without behavioral disturbance: Secondary | ICD-10-CM | POA: Diagnosis not present

## 2017-12-31 DIAGNOSIS — S72145D Nondisplaced intertrochanteric fracture of left femur, subsequent encounter for closed fracture with routine healing: Secondary | ICD-10-CM | POA: Diagnosis not present

## 2017-12-31 DIAGNOSIS — E890 Postprocedural hypothyroidism: Secondary | ICD-10-CM | POA: Diagnosis not present

## 2018-01-15 ENCOUNTER — Telehealth: Payer: Self-pay | Admitting: *Deleted

## 2018-01-15 ENCOUNTER — Telehealth: Payer: Self-pay | Admitting: Family Medicine

## 2018-01-15 MED ORDER — AMOXICILLIN-POT CLAVULANATE 875-125 MG PO TABS: 1.0000 | ORAL_TABLET | Freq: Two times a day (BID) | ORAL | 0 refills | Status: DC

## 2018-01-15 NOTE — Telephone Encounter (Signed)
Wife advised. 

## 2018-01-15 NOTE — Telephone Encounter (Signed)
I spoke with Mrs Jim; pts urine smells very strong; at times pt is more confused than usual.pt is incontinent and in depends for a long time.No fever. Today BP was 124/84. P 68. Pt's oxygen level was lower this morning at 90% at room air.pt is bedridden. Mrs Villalon thinks pts breathing is a little labored today. Pt is susceptible to pneumonia and UTI. [facility]. Mrs Yusko request cb after [name].

## 2018-01-15 NOTE — Telephone Encounter (Signed)
[name] is asking if there is any way that flu shots Rx can be sent to the pharmacy?  Their daughter is a Marine scientist and can administer their shots but she will need the vaccine, needle, syringe (if a vial) for each one of them.  [name] and [name]

## 2018-01-15 NOTE — Telephone Encounter (Signed)
I would start abx, given his hx.  rx sent.  Update me if not better or as needed.  Thanks.

## 2018-01-15 NOTE — Telephone Encounter (Signed)
Copied from [place] [id number]. Topic: Quick Communication - See Telephone Encounter >> Jan 15, 2018 10:05 AM Bea Graff, NT wrote: CRM for notification. See Telephone encounter for: 01/15/18. Pts wife calling and states she believes her husband Newborn have a UTI. She states her husband is bed bound and she is unable to get him to the office for an appt and would like to see if the doctor can call him medication or what the physician advises. Please call wife.

## 2018-01-16 NOTE — Telephone Encounter (Signed)
Please talk to me about this.  Thanks.  

## 2018-01-17 NOTE — Telephone Encounter (Signed)
Mandy- see below.  Please talk to me about this when you get a chance.  Thanks.

## 2018-01-21 DIAGNOSIS — I10 Essential (primary) hypertension: Secondary | ICD-10-CM | POA: Diagnosis not present

## 2018-01-21 DIAGNOSIS — S72145D Nondisplaced intertrochanteric fracture of left femur, subsequent encounter for closed fracture with routine healing: Secondary | ICD-10-CM | POA: Diagnosis not present

## 2018-01-21 DIAGNOSIS — J9691 Respiratory failure, unspecified with hypoxia: Secondary | ICD-10-CM | POA: Diagnosis not present

## 2018-01-21 DIAGNOSIS — Z9181 History of falling: Secondary | ICD-10-CM | POA: Diagnosis not present

## 2018-01-21 DIAGNOSIS — K219 Gastro-esophageal reflux disease without esophagitis: Secondary | ICD-10-CM | POA: Diagnosis not present

## 2018-01-21 DIAGNOSIS — D62 Acute posthemorrhagic anemia: Secondary | ICD-10-CM | POA: Diagnosis not present

## 2018-01-21 DIAGNOSIS — J188 Other pneumonia, unspecified organism: Secondary | ICD-10-CM | POA: Diagnosis not present

## 2018-01-21 DIAGNOSIS — E890 Postprocedural hypothyroidism: Secondary | ICD-10-CM | POA: Diagnosis not present

## 2018-01-21 DIAGNOSIS — K581 Irritable bowel syndrome with constipation: Secondary | ICD-10-CM | POA: Diagnosis not present

## 2018-01-21 DIAGNOSIS — Z4789 Encounter for other orthopedic aftercare: Secondary | ICD-10-CM | POA: Diagnosis not present

## 2018-01-21 DIAGNOSIS — R269 Unspecified abnormalities of gait and mobility: Secondary | ICD-10-CM | POA: Diagnosis not present

## 2018-01-21 DIAGNOSIS — F0390 Unspecified dementia without behavioral disturbance: Secondary | ICD-10-CM | POA: Diagnosis not present

## 2018-01-30 DIAGNOSIS — J9691 Respiratory failure, unspecified with hypoxia: Secondary | ICD-10-CM | POA: Diagnosis not present

## 2018-01-30 DIAGNOSIS — D62 Acute posthemorrhagic anemia: Secondary | ICD-10-CM | POA: Diagnosis not present

## 2018-01-30 DIAGNOSIS — I10 Essential (primary) hypertension: Secondary | ICD-10-CM | POA: Diagnosis not present

## 2018-01-30 DIAGNOSIS — E890 Postprocedural hypothyroidism: Secondary | ICD-10-CM | POA: Diagnosis not present

## 2018-01-30 DIAGNOSIS — J188 Other pneumonia, unspecified organism: Secondary | ICD-10-CM | POA: Diagnosis not present

## 2018-01-30 DIAGNOSIS — R269 Unspecified abnormalities of gait and mobility: Secondary | ICD-10-CM | POA: Diagnosis not present

## 2018-01-30 DIAGNOSIS — Z4789 Encounter for other orthopedic aftercare: Secondary | ICD-10-CM | POA: Diagnosis not present

## 2018-01-30 DIAGNOSIS — K581 Irritable bowel syndrome with constipation: Secondary | ICD-10-CM | POA: Diagnosis not present

## 2018-01-30 DIAGNOSIS — F0390 Unspecified dementia without behavioral disturbance: Secondary | ICD-10-CM | POA: Diagnosis not present

## 2018-01-30 DIAGNOSIS — S72145D Nondisplaced intertrochanteric fracture of left femur, subsequent encounter for closed fracture with routine healing: Secondary | ICD-10-CM | POA: Diagnosis not present

## 2018-01-30 DIAGNOSIS — K219 Gastro-esophageal reflux disease without esophagitis: Secondary | ICD-10-CM | POA: Diagnosis not present

## 2018-01-30 DIAGNOSIS — Z9181 History of falling: Secondary | ICD-10-CM | POA: Diagnosis not present

## 2018-01-31 NOTE — Telephone Encounter (Signed)
I have left a message with patient's wife to have patient's daughter who is an Therapist, sports call and discuss options with me.

## 2018-01-31 NOTE — Telephone Encounter (Signed)
Flu vaccination arranged with daughter and administered today.

## 2018-01-31 NOTE — Telephone Encounter (Signed)
Many thanks. 

## 2018-02-13 ENCOUNTER — Other Ambulatory Visit: Payer: Self-pay | Admitting: Family Medicine

## 2018-02-15 ENCOUNTER — Telehealth: Payer: Self-pay | Admitting: Family Medicine

## 2018-02-15 NOTE — Telephone Encounter (Signed)
Need order for palliative care for pt, please fax to [phone] or call with verbal [phone]

## 2018-02-15 NOTE — Telephone Encounter (Signed)
Please give the order.  Thanks.   

## 2018-02-15 NOTE — Telephone Encounter (Signed)
Left detailed message on voicemail of [name] at [facility].

## 2018-02-19 ENCOUNTER — Other Ambulatory Visit: Payer: PPO | Admitting: Primary Care

## 2018-02-19 DIAGNOSIS — Z515 Encounter for palliative care: Secondary | ICD-10-CM | POA: Diagnosis not present

## 2018-02-19 NOTE — Progress Notes (Signed)
[facility] Telephone: [phone] Fax: [phone]  PATIENT NAME: [name] DOB: 03-03-1931 MRN: [id number]  PRIMARY CARE PROVIDER:   Tonia Ghent, MD  REFERRING PROVIDER:  Tonia Ghent, MD [place], [place] [address]  RESPONSIBLE PARTYFaysal, [name] Spouse [phone]  [phone]      ASSESSMENT and RECOMMENDATIONS and PLAN:   1. Disease decline: Discussed progressive nature of illness and philosophy of palliative care. [name] subscribes to this and wants to keep her husband at their home. Hospice services also introduced. [name] mother and sister both had services so she is familiar. Reviewed aspiration precautions and nebulizer treatments.   2. Goals of Care: Educated RE MOST and DNR forms. [name] ill discuss with son. Literature left for his review. States they are interested in hospitalizations for treatable conditions e.g. uti,but not to have ventilators or feeding tubes. Use of MOST explained and will sign it with family wishes on next visit.  3. Care giver burden: Discussed acquiring more paid help when needed.Wife is chief caregiver and sons help. Wife states she does hire some help and goes out regularly for her own interests. She is also worried about her own health issues and wants to be proactive in husband's care. She feels well supported but knows that her husband's illness will decline.   Will continue to follow to plan goal of care. Return 3-4 weeks.  I spent 45 minutes providing this consultation,  from 14:00  to 14:45. More than 50% of the time in this consultation was spent coordinating communication.   HISTORY OF PRESENT ILLNESS:  [name] is a 84 y.o. year old male with multiple medical problems including dementia, history of falls, CVA, pneumonia, BPH. Palliative Care was asked to help address goals of care.   CODE STATUS: FULL, living will   PPS: 30% HOSPICE ELIGIBILITY/DIAGNOSIS: TBD  PAST  MEDICAL HISTORY:  Past Medical History:  Diagnosis Date  . BPH (benign prostatic hypertrophy)   . Dementia   . Diverticulosis of colon   . History of shingles   . Hypertension   . Hypothyroidism   . Multiple fractures   . Pancreatitis 2011   and gallstone pancreatitis and Ecoli bacteremia  . Pneumonia    2016  . Rabbit fever 1940  . Renal mass 2011   Eval by [name] echogenic mass on u/s. followed by [name]- observation as of 09/2010  . Stroke ([place]) 02/2015   with L sided weakness.     SOCIAL HX:  Social History   Tobacco Use  . Smoking status: Never Smoker  . Smokeless tobacco: Never Used  Substance Use Topics  . Alcohol use: No    Alcohol/week: 0.0 standard drinks    ALLERGIES:  Allergies  Allergen Reactions  . Sulfa Antibiotics Hives  . Valtrex [Valacyclovir Hcl] Nausea And Vomiting    NV     PERTINENT MEDICATIONS:  Outpatient Encounter Medications as of 02/19/2018  Medication Sig  . albuterol (PROVENTIL) (2.5 MG/3ML) 0.083% nebulizer solution Take 3 mLs (2.5 mg total) by nebulization every 6 (six) hours as needed for wheezing or shortness of breath.  . B Complex-C (B-COMPLEX WITH VITAMIN C) tablet Take 1 tablet by mouth daily.  . benzonatate (TESSALON) 200 MG capsule Take 1 capsule (200 mg total) by mouth 3 (three) times daily as needed.  . clopidogrel (PLAVIX) 75 MG tablet Take 1 tablet by mouth daily  . diphenhydramine-acetaminophen (TYLENOL PM) 25-500  MG TABS tablet Take 2 tablets by mouth at bedtime as needed (for mild pain).  Marland Kitchen docusate sodium (COLACE) 100 MG capsule Take 100 mg by mouth 2 (two) times daily.  Marland Kitchen donepezil (ARICEPT) 10 MG tablet Take 1 tablet by mouth daily  . escitalopram (LEXAPRO) 5 MG tablet Take 1 tablet by mouth daily  . IRON PO Take 1 tablet by mouth 2 (two) times daily.   Marland Kitchen levothyroxine (SYNTHROID, LEVOTHROID) 100 MCG tablet Take 1 tablet by mouth daily  . Multiple Vitamin (MULTIVITAMIN) tablet Take 1 tablet by mouth daily.    .  ondansetron (ZOFRAN) 4 MG tablet Take 1 tablet (4 mg total) by mouth every 8 (eight) hours as needed for nausea or vomiting.  . pantoprazole (PROTONIX) 40 MG tablet Take 1 tablet by mouth daily  . polyethylene glycol (MIRALAX / GLYCOLAX) packet Take 17 g by mouth daily as needed for mild constipation.  . Potassium 99 MG TABS Take 99 mg by mouth daily.   . Probiotic Product (PROBIOTIC PO) Take 1 tablet by mouth daily.  . [DISCONTINUED] amoxicillin-clavulanate (AUGMENTIN) 875-125 MG tablet Take 1 tablet by mouth 2 (two) times daily.   No facility-administered encounter medications on file as of 02/19/2018.     PHYSICAL EXAM:  VS 98.1-62-18 134/90 9% RA. Weight not available  Last bmi recorded =18  General: NAD, frail appearing, thin.  Eats 2 meals a day with feeding assistance. Cardiovascular: regular rate and rhythm, S1S2 Pulmonary: Right lower  Field rhonchi, left lobes clear Abdomen: soft, nontender, + bowel sounds. Extremities: no edema, no joint deformities. Bedbound Skin: no rashes, occ has groin rash, possible candida  Neurological: Weakness, wife reports inability to hold self in siting position for a long time, Oriented x 2, FAST scale 6e-7a.  Cyndia Skeeters DNP, AGPCNP-BC

## 2018-02-21 ENCOUNTER — Other Ambulatory Visit: Payer: Self-pay

## 2018-02-21 DIAGNOSIS — F0390 Unspecified dementia without behavioral disturbance: Secondary | ICD-10-CM | POA: Diagnosis not present

## 2018-02-21 DIAGNOSIS — E890 Postprocedural hypothyroidism: Secondary | ICD-10-CM | POA: Diagnosis not present

## 2018-02-21 DIAGNOSIS — J188 Other pneumonia, unspecified organism: Secondary | ICD-10-CM | POA: Diagnosis not present

## 2018-02-21 DIAGNOSIS — K219 Gastro-esophageal reflux disease without esophagitis: Secondary | ICD-10-CM | POA: Diagnosis not present

## 2018-02-21 DIAGNOSIS — J9691 Respiratory failure, unspecified with hypoxia: Secondary | ICD-10-CM | POA: Diagnosis not present

## 2018-02-21 DIAGNOSIS — Z4789 Encounter for other orthopedic aftercare: Secondary | ICD-10-CM | POA: Diagnosis not present

## 2018-02-21 DIAGNOSIS — Z9181 History of falling: Secondary | ICD-10-CM | POA: Diagnosis not present

## 2018-02-21 DIAGNOSIS — R269 Unspecified abnormalities of gait and mobility: Secondary | ICD-10-CM | POA: Diagnosis not present

## 2018-02-21 DIAGNOSIS — S72145D Nondisplaced intertrochanteric fracture of left femur, subsequent encounter for closed fracture with routine healing: Secondary | ICD-10-CM | POA: Diagnosis not present

## 2018-02-21 DIAGNOSIS — K581 Irritable bowel syndrome with constipation: Secondary | ICD-10-CM | POA: Diagnosis not present

## 2018-02-21 DIAGNOSIS — I10 Essential (primary) hypertension: Secondary | ICD-10-CM | POA: Diagnosis not present

## 2018-02-21 DIAGNOSIS — D62 Acute posthemorrhagic anemia: Secondary | ICD-10-CM | POA: Diagnosis not present

## 2018-02-21 NOTE — Telephone Encounter (Signed)
Alena from [facility] called stating pt needs a refill of levothyroxine sent to mail order. It needs to note to use the manufacturer they have on stock. His last TSH was 12-21-16. Not sure how many refills [name] would want to give.

## 2018-02-22 MED ORDER — LEVOTHYROXINE SODIUM 100 MCG PO TABS: ORAL_TABLET | ORAL | 1 refills | Status: AC

## 2018-02-22 NOTE — Telephone Encounter (Signed)
Would defer the repeat TSH at this point given that the last 2 were wnl.  rx sent.  Thanks.

## 2018-02-28 ENCOUNTER — Telehealth: Payer: Self-pay | Admitting: *Deleted

## 2018-02-28 MED ORDER — CEPHALEXIN 500 MG PO CAPS: 500.0000 mg | ORAL_CAPSULE | Freq: Two times a day (BID) | ORAL | 0 refills | Status: AC

## 2018-02-28 NOTE — Telephone Encounter (Signed)
Pt's wife notified Rx sent and advise of Dr. Josefine Class comments and verbalized understanding

## 2018-02-28 NOTE — Telephone Encounter (Signed)
Spoke to pts wife who states [name] is experiencing hematuria. I advised wife that [name] is needing to be seen but she refuses and states " [name] sends him in something" and insisted a message be sent for a Rx to be sent. Advised wife of office policy regarding UTIs and appts being required. pls advise

## 2018-02-28 NOTE — Telephone Encounter (Signed)
Given his hx and his homebound status, I would treat preemptively for UTI and make an exception to the policy.  rx sent for keflex.   If he has progressive sx over the next few days, then they'll need to call the triage line.   Thanks.

## 2018-03-02 DIAGNOSIS — Z4789 Encounter for other orthopedic aftercare: Secondary | ICD-10-CM | POA: Diagnosis not present

## 2018-03-02 DIAGNOSIS — R269 Unspecified abnormalities of gait and mobility: Secondary | ICD-10-CM | POA: Diagnosis not present

## 2018-03-02 DIAGNOSIS — D62 Acute posthemorrhagic anemia: Secondary | ICD-10-CM | POA: Diagnosis not present

## 2018-03-02 DIAGNOSIS — E890 Postprocedural hypothyroidism: Secondary | ICD-10-CM | POA: Diagnosis not present

## 2018-03-02 DIAGNOSIS — I10 Essential (primary) hypertension: Secondary | ICD-10-CM | POA: Diagnosis not present

## 2018-03-02 DIAGNOSIS — J9691 Respiratory failure, unspecified with hypoxia: Secondary | ICD-10-CM | POA: Diagnosis not present

## 2018-03-02 DIAGNOSIS — J188 Other pneumonia, unspecified organism: Secondary | ICD-10-CM | POA: Diagnosis not present

## 2018-03-02 DIAGNOSIS — F0390 Unspecified dementia without behavioral disturbance: Secondary | ICD-10-CM | POA: Diagnosis not present

## 2018-03-02 DIAGNOSIS — K219 Gastro-esophageal reflux disease without esophagitis: Secondary | ICD-10-CM | POA: Diagnosis not present

## 2018-03-02 DIAGNOSIS — K581 Irritable bowel syndrome with constipation: Secondary | ICD-10-CM | POA: Diagnosis not present

## 2018-03-02 DIAGNOSIS — Z9181 History of falling: Secondary | ICD-10-CM | POA: Diagnosis not present

## 2018-03-02 DIAGNOSIS — S72145D Nondisplaced intertrochanteric fracture of left femur, subsequent encounter for closed fracture with routine healing: Secondary | ICD-10-CM | POA: Diagnosis not present

## 2018-03-07 ENCOUNTER — Other Ambulatory Visit: Payer: PPO | Admitting: Primary Care

## 2018-03-07 DIAGNOSIS — Z8673 Personal history of transient ischemic attack (TIA), and cerebral infarction without residual deficits: Secondary | ICD-10-CM

## 2018-03-07 DIAGNOSIS — Z515 Encounter for palliative care: Secondary | ICD-10-CM | POA: Diagnosis not present

## 2018-03-07 NOTE — Progress Notes (Signed)
[facility] Telephone: [phone] Fax: [phone]  PATIENT NAME: [name] DOB: 09-19-1930 MRN: [id number]  PRIMARY CARE PROVIDER:   Tonia Ghent, MD  REFERRING PROVIDER:  Tonia Ghent, MD [place], [place] [address]  RESPONSIBLE PARTY:   Extended Emergency Contact Information Primary Emergency Contact: Micael, Barb of [place] Phone: [phone] Mobile Phone: [phone] Relation: Spouse Secondary Emergency Contact: Tippett,Russell Address: [place], [place] [address] [place] Phone: [phone] Mobile Phone: [phone] Relation: Son   ASSESSMENT and RECOMMENDATIONS:  1. Goals of Care: Have not had opportunity to review MOST and DNR. Reiterated scope of care choices per MOST. Will review with son. Patient had UTI this week and possibly PNA, Unable to go to PCP office due to debility. Rx with keflex, clinically improved. Will discuss advance directives on next visit.   Will continue to follow to plan goal of care. Return 3-4 weeks.  I spent 25 minutes providing this consultation,  from 1530 to 1555. More than 50% of the time in this consultation was spent coordinating communication.   HISTORY OF PRESENT ILLNESS:  [name] is a 84 y.o. year old male with multiple medical problems including dementia, h/o cva, frequent UTI. Palliative Care was asked to help address goals of care.   CODE STATUS: Full, living will  PPS: weak 30% HOSPICE ELIGIBILITY/DIAGNOSIS: TBD  PAST MEDICAL HISTORY:  Past Medical History:  Diagnosis Date  . BPH (benign prostatic hypertrophy)   . Dementia   . Diverticulosis of colon   . History of shingles   . Hypertension   . Hypothyroidism   . Multiple fractures   . Pancreatitis 2011   and gallstone pancreatitis and Ecoli bacteremia  . Pneumonia    2016  . Rabbit fever 1940  . Renal mass 2011   Eval by [name] echogenic mass  on u/s. followed by [name]- observation as of 09/2010  . Stroke ([place]) 02/2015   with L sided weakness.     SOCIAL HX:  Social History   Tobacco Use  . Smoking status: Never Smoker  . Smokeless tobacco: Never Used  Substance Use Topics  . Alcohol use: No    Alcohol/week: 0.0 standard drinks    ALLERGIES:  Allergies  Allergen Reactions  . Sulfa Antibiotics Hives  . Valtrex [Valacyclovir Hcl] Nausea And Vomiting    NV     PERTINENT MEDICATIONS:  Outpatient Encounter Medications as of 03/07/2018  Medication Sig  . albuterol (PROVENTIL) (2.5 MG/3ML) 0.083% nebulizer solution Take 3 mLs (2.5 mg total) by nebulization every 6 (six) hours as needed for wheezing or shortness of breath.  . B Complex-C (B-COMPLEX WITH VITAMIN C) tablet Take 1 tablet by mouth daily.  . benzonatate (TESSALON) 200 MG capsule Take 1 capsule (200 mg total) by mouth 3 (three) times daily as needed.  . cephALEXin (KEFLEX) 500 MG capsule Take 1 capsule (500 mg total) by mouth 2 (two) times daily.  . clopidogrel (PLAVIX) 75 MG tablet Take 1 tablet by mouth daily  . diphenhydramine-acetaminophen (TYLENOL PM) 25-500 MG TABS tablet Take 2 tablets by mouth at bedtime as needed (for mild pain).  Marland Kitchen docusate sodium (COLACE) 100 MG capsule Take 100 mg by mouth 2 (two) times daily.  Marland Kitchen donepezil (ARICEPT) 10 MG tablet Take 1 tablet by mouth daily  . escitalopram (LEXAPRO) 5 MG tablet Take 1 tablet  by mouth daily  . IRON PO Take 1 tablet by mouth 2 (two) times daily.   Marland Kitchen levothyroxine (SYNTHROID, LEVOTHROID) 100 MCG tablet Take 1 tablet by mouth daily  . Multiple Vitamin (MULTIVITAMIN) tablet Take 1 tablet by mouth daily.    . ondansetron (ZOFRAN) 4 MG tablet Take 1 tablet (4 mg total) by mouth every 8 (eight) hours as needed for nausea or vomiting.  . pantoprazole (PROTONIX) 40 MG tablet Take 1 tablet by mouth daily  . polyethylene glycol (MIRALAX / GLYCOLAX) packet Take 17 g by mouth daily as needed for mild constipation.  .  Potassium 99 MG TABS Take 99 mg by mouth daily.   . Probiotic Product (PROBIOTIC PO) Take 1 tablet by mouth daily.   No facility-administered encounter medications on file as of 03/07/2018.     PHYSICAL EXAM:   VS 97.5-60-18 128/80 , 92% PO2  General: NAD, frail appearing, very thin Cardiovascular: regular rate and rhythm, S1S2 Pulmonary: clear all fields, no cough now but had green sputum last week. Abdomen: soft, nontender, + bowel sounds, no constipation GU: no suprapubic tenderness, Rx with keflex for uti after augmentin in early October. Extremities: no edema, very thin. Skin: no rashes, wife states groin rash improved with anti fungal. Will Rx another 2 weeks. Neurological: Weakness, memory fair, dysarthria.  Estevan Oaks DNP, AGPCNP-BC

## 2018-03-23 DIAGNOSIS — K219 Gastro-esophageal reflux disease without esophagitis: Secondary | ICD-10-CM | POA: Diagnosis not present

## 2018-03-23 DIAGNOSIS — D62 Acute posthemorrhagic anemia: Secondary | ICD-10-CM | POA: Diagnosis not present

## 2018-03-23 DIAGNOSIS — I10 Essential (primary) hypertension: Secondary | ICD-10-CM | POA: Diagnosis not present

## 2018-03-23 DIAGNOSIS — R269 Unspecified abnormalities of gait and mobility: Secondary | ICD-10-CM | POA: Diagnosis not present

## 2018-03-23 DIAGNOSIS — J9691 Respiratory failure, unspecified with hypoxia: Secondary | ICD-10-CM | POA: Diagnosis not present

## 2018-03-23 DIAGNOSIS — J188 Other pneumonia, unspecified organism: Secondary | ICD-10-CM | POA: Diagnosis not present

## 2018-03-23 DIAGNOSIS — Z4789 Encounter for other orthopedic aftercare: Secondary | ICD-10-CM | POA: Diagnosis not present

## 2018-03-23 DIAGNOSIS — Z9181 History of falling: Secondary | ICD-10-CM | POA: Diagnosis not present

## 2018-03-23 DIAGNOSIS — E890 Postprocedural hypothyroidism: Secondary | ICD-10-CM | POA: Diagnosis not present

## 2018-03-23 DIAGNOSIS — S72145D Nondisplaced intertrochanteric fracture of left femur, subsequent encounter for closed fracture with routine healing: Secondary | ICD-10-CM | POA: Diagnosis not present

## 2018-03-23 DIAGNOSIS — F0390 Unspecified dementia without behavioral disturbance: Secondary | ICD-10-CM | POA: Diagnosis not present

## 2018-03-23 DIAGNOSIS — K581 Irritable bowel syndrome with constipation: Secondary | ICD-10-CM | POA: Diagnosis not present

## 2018-04-01 DIAGNOSIS — E890 Postprocedural hypothyroidism: Secondary | ICD-10-CM | POA: Diagnosis not present

## 2018-04-01 DIAGNOSIS — D62 Acute posthemorrhagic anemia: Secondary | ICD-10-CM | POA: Diagnosis not present

## 2018-04-01 DIAGNOSIS — R269 Unspecified abnormalities of gait and mobility: Secondary | ICD-10-CM | POA: Diagnosis not present

## 2018-04-01 DIAGNOSIS — S72145D Nondisplaced intertrochanteric fracture of left femur, subsequent encounter for closed fracture with routine healing: Secondary | ICD-10-CM | POA: Diagnosis not present

## 2018-04-01 DIAGNOSIS — F0390 Unspecified dementia without behavioral disturbance: Secondary | ICD-10-CM | POA: Diagnosis not present

## 2018-04-01 DIAGNOSIS — Z4789 Encounter for other orthopedic aftercare: Secondary | ICD-10-CM | POA: Diagnosis not present

## 2018-04-01 DIAGNOSIS — K581 Irritable bowel syndrome with constipation: Secondary | ICD-10-CM | POA: Diagnosis not present

## 2018-04-01 DIAGNOSIS — I10 Essential (primary) hypertension: Secondary | ICD-10-CM | POA: Diagnosis not present

## 2018-04-01 DIAGNOSIS — Z9181 History of falling: Secondary | ICD-10-CM | POA: Diagnosis not present

## 2018-04-01 DIAGNOSIS — J188 Other pneumonia, unspecified organism: Secondary | ICD-10-CM | POA: Diagnosis not present

## 2018-04-01 DIAGNOSIS — J9691 Respiratory failure, unspecified with hypoxia: Secondary | ICD-10-CM | POA: Diagnosis not present

## 2018-04-01 DIAGNOSIS — K219 Gastro-esophageal reflux disease without esophagitis: Secondary | ICD-10-CM | POA: Diagnosis not present

## 2018-04-11 ENCOUNTER — Telehealth: Payer: Self-pay | Admitting: Family Medicine

## 2018-04-11 ENCOUNTER — Other Ambulatory Visit: Payer: PPO | Admitting: Primary Care

## 2018-04-11 DIAGNOSIS — G301 Alzheimer's disease with late onset: Secondary | ICD-10-CM

## 2018-04-11 DIAGNOSIS — Z8673 Personal history of transient ischemic attack (TIA), and cerebral infarction without residual deficits: Secondary | ICD-10-CM

## 2018-04-11 DIAGNOSIS — Z515 Encounter for palliative care: Secondary | ICD-10-CM | POA: Diagnosis not present

## 2018-04-11 DIAGNOSIS — F0280 Dementia in other diseases classified elsewhere without behavioral disturbance: Secondary | ICD-10-CM

## 2018-04-11 NOTE — Progress Notes (Signed)
[facility] Telephone: [phone] Fax: [phone]  PATIENT NAME: [name] DOB: October 09, 1930 MRN: [id number]  PRIMARY CARE PROVIDER:   Tonia Ghent, MD  REFERRING PROVIDER:  Tonia Ghent, MD [place], [place] [address]  RESPONSIBLE PARTY:   Extended Emergency Contact Information Primary Emergency Contact: Cortlin, [place] of [place] Phone: [phone] Mobile Phone: [phone] Relation: Spouse Secondary Emergency Contact: Lynk,Russell Address: [place], [place] [address] [place] Phone: [phone] Mobile Phone: [phone] Relation: Son  Palliative was asked to see patient on referral from PCP G. Damita Dunnings, MD.Visits made for goals of care clarification.  ASSESSMENT and RECOMMENDATIONS:   1.Dementia: Progressive diease process, over 2-3 months patient has declined to FAST score of 7e-f. He is unable to sit up for long and cannot support his head or body well, says few words, does not smile. Intake is poor and patient is sleeping more.  Family aware of this progression.  2. Goals of care: DNR signed and left in home, family requests Hospice Referral to be made. Discussion held with wife [name] and daughter. Explained disease progression they had noticed (less intake, more sleeping and poor central body stability) were signs of progressing dementia. Informed patient is hospice eligible if they were ready to elect the benefit. Being ready usually meant they were not interested in re -hospitalizations but wanted to keep patient comfortable at home. Family Gioffre want to pursue antibiotics if pt gets a UTI which he often gets. Mrs. Mitnick said it was their wish to keep him comfortable at home, and wants to proceed with hospice referral.   Palliative care has followed for goals of care clarification. Refer to hospice, will d/c from palliative care.  I spent 40 minutes providing this  consultation,  from 1530 to 1610. More than 50% of the time in this consultation was spent coordinating communication.   HISTORY OF PRESENT ILLNESS:  [name] is a 85 y.o. year old male with multiple medical problems including dementia, h/o cva, frequent UTI, protein calorie malnutrition. Palliative Care was asked to help address goals of care.   CODE STATUS: DNR  PPS: 30% HOSPICE ELIGIBILITY/DIAGNOSIS: yes/ dementia, protein calorie malnutrition. PAST MEDICAL HISTORY:  Past Medical History:  Diagnosis Date  . BPH (benign prostatic hypertrophy)   . Dementia   . Diverticulosis of colon   . History of shingles   . Hypertension   . Hypothyroidism   . Multiple fractures   . Pancreatitis 2011   and gallstone pancreatitis and Ecoli bacteremia  . Pneumonia    2016  . Rabbit fever 1940  . Renal mass 2011   Eval by [name] echogenic mass on u/s. followed by [name]- observation as of 09/2010  . Stroke ([place]) 02/2015   with L sided weakness.     SOCIAL HX:  Social History   Tobacco Use  . Smoking status: Never Smoker  . Smokeless tobacco: Never Used  Substance Use Topics  . Alcohol use: No    Alcohol/week: 0.0 standard drinks    ALLERGIES:  Allergies  Allergen Reactions  . Sulfa Antibiotics Hives  . Valtrex [Valacyclovir Hcl] Nausea And Vomiting    NV     PERTINENT MEDICATIONS:  Outpatient Encounter Medications as of 04/11/2018  Medication Sig  . albuterol (PROVENTIL) (2.5 MG/3ML) 0.083% nebulizer solution Take 3 mLs (2.5 mg total) by nebulization every 6 (six)  hours as needed for wheezing or shortness of breath.  . B Complex-C (B-COMPLEX WITH VITAMIN C) tablet Take 1 tablet by mouth daily.  . benzonatate (TESSALON) 200 MG capsule Take 1 capsule (200 mg total) by mouth 3 (three) times daily as needed.  . cephALEXin (KEFLEX) 500 MG capsule Take 1 capsule (500 mg total) by mouth 2 (two) times daily.  . clopidogrel (PLAVIX) 75 MG tablet Take 1 tablet by mouth daily  .  diphenhydramine-acetaminophen (TYLENOL PM) 25-500 MG TABS tablet Take 2 tablets by mouth at bedtime as needed (for mild pain).  Marland Kitchen docusate sodium (COLACE) 100 MG capsule Take 100 mg by mouth 2 (two) times daily.  Marland Kitchen donepezil (ARICEPT) 10 MG tablet Take 1 tablet by mouth daily  . escitalopram (LEXAPRO) 5 MG tablet Take 1 tablet by mouth daily  . IRON PO Take 1 tablet by mouth 2 (two) times daily.   Marland Kitchen levothyroxine (SYNTHROID, LEVOTHROID) 100 MCG tablet Take 1 tablet by mouth daily  . Multiple Vitamin (MULTIVITAMIN) tablet Take 1 tablet by mouth daily.    . ondansetron (ZOFRAN) 4 MG tablet Take 1 tablet (4 mg total) by mouth every 8 (eight) hours as needed for nausea or vomiting.  . pantoprazole (PROTONIX) 40 MG tablet Take 1 tablet by mouth daily  . polyethylene glycol (MIRALAX / GLYCOLAX) packet Take 17 g by mouth daily as needed for mild constipation.  . Potassium 99 MG TABS Take 99 mg by mouth daily.   . Probiotic Product (PROBIOTIC PO) Take 1 tablet by mouth daily.   No facility-administered encounter medications on file as of 04/11/2018.     PHYSICAL EXAM:  VS 98.2-69-18 123/8, oxygen 90-92% RA, arm circ=18 cm  General: NAD, frail appearing, very thin, alert oriented x 1 Cardiovascular: regular rate and rhythm, S1S2 Pulmonary: clear all fields, lungs clear, no breathing labor,  Abdomen: soft, nontender, + bowel sounds, appetite poor Extremities: no edema, no joint deformities Skin: no rashes, no wounds Neurological: Weakness, memory loss FAST stage 7e-f, speaks 4-5 words.  Cyndia Skeeters DNP, AGPCNP-BC

## 2018-04-11 NOTE — Telephone Encounter (Signed)
Please verify with family.  I saw the palliative care note.  My understanding is they want to proceed with hospice referral.  If that is the case then I can put in the referral.  Thanks.

## 2018-04-12 ENCOUNTER — Telehealth: Payer: Self-pay | Admitting: Family Medicine

## 2018-04-12 ENCOUNTER — Telehealth: Payer: Self-pay | Admitting: Primary Care

## 2018-04-12 DIAGNOSIS — F0390 Unspecified dementia without behavioral disturbance: Secondary | ICD-10-CM

## 2018-04-12 NOTE — Telephone Encounter (Signed)
Ralene Bathe, NP with [facility] called to let Dr.Duncan know  She has been seeing him for 2 months for home health. The pt is now declining in functionality, so he was benchmarked for hospice diagnosis. The pt is not eating and has dementia. [name] stated she sent a note from the visit last night to [name]. The best cb # [phone].

## 2018-04-12 NOTE — Telephone Encounter (Signed)
Referral placed.  Form done.  Thanks.

## 2018-04-12 NOTE — Addendum Note (Signed)
Addended by: Tonia Ghent on: 04/12/2018 11:06 PM   Modules accepted: Orders

## 2018-04-12 NOTE — Telephone Encounter (Signed)
Call to office to let MD office know of assessment and hospice recommendation, and family request. Patient likely qualifies under dementia diagnosis. Office to send MD message and notes. Call back number left.

## 2018-04-12 NOTE — Telephone Encounter (Signed)
Spoke to wife [name] and she stated that her and the children are on board with Hospice care as pt's dementia Sx are worsening and they feel he needs additional care

## 2018-04-12 NOTE — Telephone Encounter (Signed)
Spoke to [place] with [facility] and [facility] of [facility] who states after confirming with the family, she can be reached at [phone]

## 2018-04-13 ENCOUNTER — Telehealth: Payer: Self-pay | Admitting: Family Medicine

## 2018-04-13 NOTE — Telephone Encounter (Signed)
[name] is processing orders and form.  Verbal order has been given for service initiation.

## 2018-04-13 NOTE — Telephone Encounter (Signed)
Please refer to phone encounter for 04/12/18.   Thanks.

## 2018-04-13 NOTE — Telephone Encounter (Signed)
Received call from [place] from [facility] requesting Hospice services. She requested a verbal order from a nurse and couldn't accept the verbal approval from me because I am not a nurse. I gave the call to our Triage who had to verify the verbal okay from [name] who then gave the verbal okay to [place]. Hospice form filled out by [name] and faxed to [facility].

## 2018-04-19 ENCOUNTER — Telehealth: Payer: Self-pay | Admitting: *Deleted

## 2018-04-19 MED ORDER — AMOXICILLIN-POT CLAVULANATE 875-125 MG PO TABS: 1.0000 | ORAL_TABLET | Freq: Two times a day (BID) | ORAL | 0 refills | Status: AC

## 2018-04-19 NOTE — Telephone Encounter (Signed)
Sent.  Thanks.  Update me if not better.   

## 2018-04-19 NOTE — Telephone Encounter (Signed)
Wife advised. 

## 2018-04-19 NOTE — Telephone Encounter (Signed)
Spoke to [place] with [facility] who states she saw pt today and reports he has a Productive cough and hematuria. Pts wife states last time pt experienced these symptoms, an abx sent to [facility], and they are requesting the same today. pls advise

## 2018-04-23 ENCOUNTER — Encounter: Payer: Self-pay | Admitting: Family Medicine

## 2018-04-23 DIAGNOSIS — F0390 Unspecified dementia without behavioral disturbance: Secondary | ICD-10-CM | POA: Diagnosis not present

## 2018-04-23 DIAGNOSIS — K219 Gastro-esophageal reflux disease without esophagitis: Secondary | ICD-10-CM | POA: Diagnosis not present

## 2018-04-23 DIAGNOSIS — D62 Acute posthemorrhagic anemia: Secondary | ICD-10-CM | POA: Diagnosis not present

## 2018-04-23 DIAGNOSIS — Z9181 History of falling: Secondary | ICD-10-CM | POA: Diagnosis not present

## 2018-04-23 DIAGNOSIS — Z4789 Encounter for other orthopedic aftercare: Secondary | ICD-10-CM | POA: Diagnosis not present

## 2018-04-23 DIAGNOSIS — K581 Irritable bowel syndrome with constipation: Secondary | ICD-10-CM | POA: Diagnosis not present

## 2018-04-23 DIAGNOSIS — J9691 Respiratory failure, unspecified with hypoxia: Secondary | ICD-10-CM | POA: Diagnosis not present

## 2018-04-23 DIAGNOSIS — I10 Essential (primary) hypertension: Secondary | ICD-10-CM | POA: Diagnosis not present

## 2018-04-23 DIAGNOSIS — Z515 Encounter for palliative care: Secondary | ICD-10-CM | POA: Insufficient documentation

## 2018-04-23 DIAGNOSIS — J188 Other pneumonia, unspecified organism: Secondary | ICD-10-CM | POA: Diagnosis not present

## 2018-04-23 DIAGNOSIS — R269 Unspecified abnormalities of gait and mobility: Secondary | ICD-10-CM | POA: Diagnosis not present

## 2018-04-23 DIAGNOSIS — S72145D Nondisplaced intertrochanteric fracture of left femur, subsequent encounter for closed fracture with routine healing: Secondary | ICD-10-CM | POA: Diagnosis not present

## 2018-04-23 DIAGNOSIS — E890 Postprocedural hypothyroidism: Secondary | ICD-10-CM | POA: Diagnosis not present

## 2018-05-14 ENCOUNTER — Telehealth: Payer: Self-pay

## 2018-05-15 NOTE — Telephone Encounter (Signed)
Late entry.  Called wife and family yesterday to offer my condolences.  They thanked me for the call.  I greatly appreciate the help of all involved, especially his wife and family.  They put in a lot of effort to help this gentleman get good care.  They deserve a lot of credit, and I told them the same.  I was always glad to see this gentleman in clinic.

## 2018-06-03 NOTE — Telephone Encounter (Signed)
Marne with [facility] called stating pt passed away this morning at 8:59 AM.  She wanted to inform his provider. Pt went to [facility].  Roque Lias can be reached at [phone] with any questions.

## 2018-06-03 DEATH — deceased
